# Patient Record
Sex: Male | Born: 1935
Health system: Southern US, Community
[De-identification: ages and names within clinical notes are randomized; demographics above are authoritative.]

## PROBLEM LIST (undated history)

## (undated) DIAGNOSIS — J189 Pneumonia, unspecified organism: Secondary | ICD-10-CM

## (undated) DIAGNOSIS — I4891 Unspecified atrial fibrillation: Secondary | ICD-10-CM

## (undated) DIAGNOSIS — E785 Hyperlipidemia, unspecified: Secondary | ICD-10-CM

## (undated) DIAGNOSIS — D649 Anemia, unspecified: Secondary | ICD-10-CM

## (undated) DIAGNOSIS — I4892 Unspecified atrial flutter: Secondary | ICD-10-CM

## (undated) DIAGNOSIS — I5043 Acute on chronic combined systolic (congestive) and diastolic (congestive) heart failure: Secondary | ICD-10-CM

## (undated) DIAGNOSIS — I10 Essential (primary) hypertension: Secondary | ICD-10-CM

## (undated) DIAGNOSIS — Z8719 Personal history of other diseases of the digestive system: Secondary | ICD-10-CM

## (undated) DIAGNOSIS — I480 Paroxysmal atrial fibrillation: Secondary | ICD-10-CM

## (undated) DIAGNOSIS — I4510 Unspecified right bundle-branch block: Secondary | ICD-10-CM

## (undated) DIAGNOSIS — R001 Bradycardia, unspecified: Secondary | ICD-10-CM

## (undated) DIAGNOSIS — E669 Obesity, unspecified: Secondary | ICD-10-CM

## (undated) DIAGNOSIS — D696 Thrombocytopenia, unspecified: Secondary | ICD-10-CM

## (undated) DIAGNOSIS — G7000 Myasthenia gravis without (acute) exacerbation: Secondary | ICD-10-CM

## (undated) DIAGNOSIS — I214 Non-ST elevation (NSTEMI) myocardial infarction: Secondary | ICD-10-CM

## (undated) DIAGNOSIS — Z95820 Peripheral vascular angioplasty status with implants and grafts: Secondary | ICD-10-CM

## (undated) DIAGNOSIS — A419 Sepsis, unspecified organism: Secondary | ICD-10-CM

## (undated) DIAGNOSIS — J9620 Acute and chronic respiratory failure, unspecified whether with hypoxia or hypercapnia: Secondary | ICD-10-CM

## (undated) DIAGNOSIS — E039 Hypothyroidism, unspecified: Secondary | ICD-10-CM

## (undated) DIAGNOSIS — T8859XA Other complications of anesthesia, initial encounter: Secondary | ICD-10-CM

## (undated) DIAGNOSIS — I2510 Atherosclerotic heart disease of native coronary artery without angina pectoris: Secondary | ICD-10-CM

## (undated) HISTORY — DX: Unspecified atrial fibrillation: I48.91

---

## 1992-10-01 HISTORY — PX: HERNIA REPAIR: SHX51

## 2012-06-11 ENCOUNTER — Encounter (HOSPITAL_COMMUNITY): Payer: Self-pay | Admitting: Certified Registered"

## 2012-06-11 ENCOUNTER — Emergency Department (HOSPITAL_COMMUNITY): Payer: Medicare Other

## 2012-06-11 ENCOUNTER — Inpatient Hospital Stay (HOSPITAL_COMMUNITY)
Admission: EM | Admit: 2012-06-11 | Discharge: 2012-06-14 | DRG: 494 | Disposition: A | Discharge status: Home / Self Care | Payer: Medicare Other | Attending: Orthopedic Surgery | Admitting: Orthopedic Surgery

## 2012-06-11 ENCOUNTER — Emergency Department (HOSPITAL_COMMUNITY): Payer: Medicare Other | Admitting: Certified Registered"

## 2012-06-11 ENCOUNTER — Encounter (HOSPITAL_COMMUNITY): Payer: Self-pay | Admitting: *Deleted

## 2012-06-11 ENCOUNTER — Encounter (HOSPITAL_COMMUNITY)
Admission: EM | Disposition: A | Discharge status: Home / Self Care | Payer: Self-pay | Source: Home / Self Care | Attending: Orthopedic Surgery

## 2012-06-11 DIAGNOSIS — S68019A Complete traumatic metacarpophalangeal amputation of unspecified thumb, initial encounter: Secondary | ICD-10-CM | POA: Diagnosis present

## 2012-06-11 DIAGNOSIS — Y998 Other external cause status: Secondary | ICD-10-CM

## 2012-06-11 DIAGNOSIS — S43006A Unspecified dislocation of unspecified shoulder joint, initial encounter: Secondary | ICD-10-CM

## 2012-06-11 DIAGNOSIS — W230XXA Caught, crushed, jammed, or pinched between moving objects, initial encounter: Secondary | ICD-10-CM | POA: Diagnosis present

## 2012-06-11 DIAGNOSIS — Y92410 Unspecified street and highway as the place of occurrence of the external cause: Secondary | ICD-10-CM

## 2012-06-11 DIAGNOSIS — S42253A Displaced fracture of greater tuberosity of unspecified humerus, initial encounter for closed fracture: Principal | ICD-10-CM | POA: Diagnosis present

## 2012-06-11 HISTORY — PX: AMPUTATION: SHX166

## 2012-06-11 HISTORY — DX: Personal history of other diseases of the digestive system: Z87.19

## 2012-06-11 HISTORY — PX: SHOULDER CLOSED REDUCTION: SHX1051

## 2012-06-11 LAB — POCT I-STAT, CHEM 8
BUN: 17 mg/dL (ref 6–23)
Chloride: 109 mEq/L (ref 96–112)
HCT: 38 % — ABNORMAL LOW (ref 39.0–52.0)
Sodium: 140 mEq/L (ref 135–145)
TCO2: 19 mmol/L (ref 0–100)

## 2012-06-11 SURGERY — AMPUTATION DIGIT
Anesthesia: General | Site: Thumb | Laterality: Left | Wound class: Contaminated

## 2012-06-11 MED ORDER — HYDROMORPHONE HCL PF 1 MG/ML IJ SOLN
1.0000 mg | Freq: Once | INTRAMUSCULAR | Status: AC
  Administered 2012-06-11: 1 mg via INTRAVENOUS

## 2012-06-11 MED ORDER — SUFENTANIL CITRATE 50 MCG/ML IV SOLN
INTRAVENOUS | Status: DC | PRN
  Administered 2012-06-11: 10 ug via INTRAVENOUS

## 2012-06-11 MED ORDER — SODIUM CHLORIDE 0.9 % IV SOLN
INTRAVENOUS | Status: DC
  Administered 2012-06-11: 22:00:00 via INTRAVENOUS

## 2012-06-11 MED ORDER — LACTATED RINGERS IV SOLN
INTRAVENOUS | Status: DC | PRN
  Administered 2012-06-11: via INTRAVENOUS

## 2012-06-11 MED ORDER — MIDAZOLAM HCL 5 MG/5ML IJ SOLN
INTRAMUSCULAR | Status: DC | PRN
  Administered 2012-06-11: 2 mg via INTRAVENOUS

## 2012-06-11 MED ORDER — CEFAZOLIN SODIUM 1-5 GM-% IV SOLN
1.0000 g | Freq: Once | INTRAVENOUS | Status: AC
  Administered 2012-06-11 – 2012-06-12 (×2): 1 g via INTRAVENOUS
  Filled 2012-06-11: qty 50

## 2012-06-11 MED ORDER — HYDROMORPHONE HCL PF 1 MG/ML IJ SOLN
1.0000 mg | Freq: Once | INTRAMUSCULAR | Status: AC
  Administered 2012-06-11: 1 mg via INTRAVENOUS
  Filled 2012-06-11: qty 1

## 2012-06-11 SURGICAL SUPPLY — 49 items
BANDAGE CONFORM 2  STR LF (GAUZE/BANDAGES/DRESSINGS) IMPLANT
BANDAGE ELASTIC 3 VELCRO ST LF (GAUZE/BANDAGES/DRESSINGS) ×3 IMPLANT
BANDAGE ELASTIC 4 VELCRO ST LF (GAUZE/BANDAGES/DRESSINGS) IMPLANT
BANDAGE GAUZE ELAST BULKY 4 IN (GAUZE/BANDAGES/DRESSINGS) ×3 IMPLANT
BNDG COHESIVE 1X5 TAN STRL LF (GAUZE/BANDAGES/DRESSINGS) IMPLANT
BNDG ELASTIC 2 VLCR STRL LF (GAUZE/BANDAGES/DRESSINGS) ×3 IMPLANT
BNDG ESMARK 4X9 LF (GAUZE/BANDAGES/DRESSINGS) ×3 IMPLANT
CANISTER SUCTION 2500CC (MISCELLANEOUS) ×3 IMPLANT
CLOTH BEACON ORANGE TIMEOUT ST (SAFETY) ×3 IMPLANT
CORDS BIPOLAR (ELECTRODE) ×3 IMPLANT
COVER SURGICAL LIGHT HANDLE (MISCELLANEOUS) ×3 IMPLANT
CUFF TOURNIQUET SINGLE 18IN (TOURNIQUET CUFF) ×3 IMPLANT
CUFF TOURNIQUET SINGLE 24IN (TOURNIQUET CUFF) IMPLANT
DRAPE SURG 17X23 STRL (DRAPES) ×3 IMPLANT
DRSG ADAPTIC 3X8 NADH LF (GAUZE/BANDAGES/DRESSINGS) IMPLANT
DRSG EMULSION OIL 3X3 NADH (GAUZE/BANDAGES/DRESSINGS) ×3 IMPLANT
GAUZE SPONGE 2X2 8PLY STRL LF (GAUZE/BANDAGES/DRESSINGS) IMPLANT
GLOVE BIO SURGEON STRL SZ7 (GLOVE) ×3 IMPLANT
GLOVE BIOGEL PI IND STRL 7.5 (GLOVE) ×2 IMPLANT
GLOVE BIOGEL PI IND STRL 8.5 (GLOVE) ×2 IMPLANT
GLOVE BIOGEL PI INDICATOR 7.5 (GLOVE) ×1
GLOVE BIOGEL PI INDICATOR 8.5 (GLOVE) ×1
GLOVE SURG ORTHO 8.0 STRL STRW (GLOVE) ×3 IMPLANT
GLOVE SURG SS PI 7.5 STRL IVOR (GLOVE) ×3 IMPLANT
GOWN PREVENTION PLUS XLARGE (GOWN DISPOSABLE) ×3 IMPLANT
GOWN SRG XL XLNG 56XLVL 4 (GOWN DISPOSABLE) ×2 IMPLANT
GOWN STRL NON-REIN LRG LVL3 (GOWN DISPOSABLE) IMPLANT
GOWN STRL NON-REIN XL XLG LVL4 (GOWN DISPOSABLE) ×1
KIT BASIN OR (CUSTOM PROCEDURE TRAY) ×3 IMPLANT
KIT ROOM TURNOVER OR (KITS) ×3 IMPLANT
MANIFOLD NEPTUNE II (INSTRUMENTS) IMPLANT
NEEDLE HYPO 25GX1X1/2 BEV (NEEDLE) ×3 IMPLANT
NS IRRIG 1000ML POUR BTL (IV SOLUTION) ×3 IMPLANT
PACK ORTHO EXTREMITY (CUSTOM PROCEDURE TRAY) ×3 IMPLANT
PAD ARMBOARD 7.5X6 YLW CONV (MISCELLANEOUS) ×3 IMPLANT
PAD CAST 4YDX4 CTTN HI CHSV (CAST SUPPLIES) IMPLANT
PADDING CAST COTTON 4X4 STRL (CAST SUPPLIES)
SOAP 2 % CHG 4 OZ (WOUND CARE) ×3 IMPLANT
SPECIMEN JAR SMALL (MISCELLANEOUS) IMPLANT
SPONGE GAUZE 2X2 STER 10/PKG (GAUZE/BANDAGES/DRESSINGS)
SPONGE GAUZE 4X4 12PLY (GAUZE/BANDAGES/DRESSINGS) ×3 IMPLANT
SUCTION FRAZIER TIP 10 FR DISP (SUCTIONS) ×3 IMPLANT
SUT MERSILENE 4 0 P 3 (SUTURE) IMPLANT
SUT PROLENE 4 0 PS 2 18 (SUTURE) ×6 IMPLANT
SYR CONTROL 10ML LL (SYRINGE) ×3 IMPLANT
TOWEL OR 17X24 6PK STRL BLUE (TOWEL DISPOSABLE) ×3 IMPLANT
TOWEL OR 17X26 10 PK STRL BLUE (TOWEL DISPOSABLE) ×3 IMPLANT
TUBE CONNECTING 12X1/4 (SUCTIONS) ×3 IMPLANT
WATER STERILE IRR 1000ML POUR (IV SOLUTION) IMPLANT
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
## 2012-06-11 NOTE — Anesthesia Preprocedure Evaluation (Addendum)
Anesthesia Evaluation  Patient identified by MRN, date of birth, ID band Patient awake and Patient confused    Reviewed: Allergy & Precautions, H&P , NPO status , Patient's Chart, lab work & pertinent test results  History of Anesthesia Complications Negative for: history of anesthetic complications  Airway       Dental   Pulmonary neg pulmonary ROS,          Cardiovascular Exercise Tolerance: Good     Neuro/Psych negative neurological ROS  negative psych ROS   GI/Hepatic negative GI ROS, Neg liver ROS,   Endo/Other  Hypothyroidism   Renal/GU negative Renal ROS  negative genitourinary   Musculoskeletal negative musculoskeletal ROS (+)   Abdominal   Peds  Hematology negative hematology ROS (+)   Anesthesia Other Findings   Reproductive/Obstetrics                           Anesthesia Physical Anesthesia Plan  ASA: II and Emergent  Anesthesia Plan: General   Post-op Pain Management:    Induction: Intravenous  Airway Management Planned: Oral ETT  Additional Equipment:   Intra-op Plan:   Post-operative Plan: Extubation in OR  Informed Consent: I have reviewed the patients History and Physical, chart, labs and discussed the procedure including the risks, benefits and alternatives for the proposed anesthesia with the patient or authorized representative who has indicated his/her understanding and acceptance.   Dental advisory given  Plan Discussed with: CRNA, Anesthesiologist and Surgeon  Anesthesia Plan Comments:         Anesthesia Quick Evaluation

## 2012-06-11 NOTE — ED Notes (Signed)
Dr. Orlan Leavens paged (x3)

## 2012-06-11 NOTE — ED Notes (Signed)
Dr. Ortman paged 

## 2012-06-11 NOTE — ED Notes (Addendum)
Dr. Orlan Leavens paged (x1)
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
## 2012-06-11 NOTE — ED Notes (Signed)
Dr. Orlan Leavens on his way to see pt.

## 2012-06-11 NOTE — ED Notes (Signed)
Labs to drawn at [facility] per [employer].

## 2012-06-11 NOTE — ED Notes (Addendum)
Dr. Orlan Leavens paged (x2)

## 2012-06-11 NOTE — ED Provider Notes (Signed)
10:09 PM Patient has arrived to CDU to see Dr Melvyn Novas for thumb amputation and shoulder dislocation.  Pt initially seen by Dr Rubin Payor at [facility].  Sign out received from Cletis Athens, Consulting civil engineer.  Pt has arrived without incident.  States he has continued pain in his arm and had.  Have ordered second dose of dilaudid be given.     Pt admitted to Dr Melvyn Novas.    [place], [place] 06/11/12 2357

## 2012-06-12 ENCOUNTER — Inpatient Hospital Stay (HOSPITAL_COMMUNITY): Payer: Medicare Other

## 2012-06-12 ENCOUNTER — Encounter (HOSPITAL_COMMUNITY): Payer: Self-pay | Admitting: *Deleted

## 2012-06-12 ENCOUNTER — Other Ambulatory Visit (HOSPITAL_COMMUNITY): Payer: Self-pay

## 2012-06-12 MED ORDER — SUCCINYLCHOLINE CHLORIDE 20 MG/ML IJ SOLN
INTRAMUSCULAR | Status: DC | PRN
  Administered 2012-06-12: 140 mg via INTRAVENOUS

## 2012-06-12 MED ORDER — HYDROMORPHONE HCL PF 1 MG/ML IJ SOLN: 0.2500 mg | INTRAMUSCULAR | Status: DC | PRN

## 2012-06-12 MED ORDER — LIDOCAINE HCL 4 % MT SOLN
OROMUCOSAL | Status: DC | PRN
  Administered 2012-06-12: 4 mL via TOPICAL

## 2012-06-12 MED ORDER — ONDANSETRON HCL 4 MG PO TABS: 4.0000 mg | ORAL_TABLET | Freq: Four times a day (QID) | ORAL | Status: DC | PRN

## 2012-06-12 MED ORDER — LEVOTHYROXINE SODIUM 50 MCG PO TABS
50.0000 ug | ORAL_TABLET | Freq: Every day | ORAL | Status: DC
  Administered 2012-06-12 – 2012-06-13 (×2): 50 ug via ORAL
  Filled 2012-06-12 (×4): qty 1

## 2012-06-12 MED ORDER — METHOCARBAMOL 500 MG PO TABS
500.0000 mg | ORAL_TABLET | Freq: Four times a day (QID) | ORAL | Status: DC | PRN
  Filled 2012-06-12: qty 1

## 2012-06-12 MED ORDER — ONDANSETRON HCL 4 MG/2ML IJ SOLN
INTRAMUSCULAR | Status: DC | PRN
  Administered 2012-06-12: 4 mg via INTRAVENOUS

## 2012-06-12 MED ORDER — ACETAMINOPHEN 10 MG/ML IV SOLN
INTRAVENOUS | Status: DC | PRN
  Administered 2012-06-12: 1000 mg via INTRAVENOUS

## 2012-06-12 MED ORDER — DROPERIDOL 2.5 MG/ML IJ SOLN
INTRAMUSCULAR | Status: DC | PRN
  Administered 2012-06-12: 0.625 mg via INTRAVENOUS

## 2012-06-12 MED ORDER — CEFAZOLIN SODIUM 1-5 GM-% IV SOLN
1.0000 g | INTRAVENOUS | Status: DC
  Filled 2012-06-12: qty 50

## 2012-06-12 MED ORDER — ONDANSETRON HCL 4 MG/2ML IJ SOLN: 4.0000 mg | Freq: Four times a day (QID) | INTRAMUSCULAR | Status: DC | PRN

## 2012-06-12 MED ORDER — DEXAMETHASONE SODIUM PHOSPHATE 4 MG/ML IJ SOLN
INTRAMUSCULAR | Status: DC | PRN
  Administered 2012-06-12: 4 mg via INTRAVENOUS

## 2012-06-12 MED ORDER — 0.9 % SODIUM CHLORIDE (POUR BTL) OPTIME
TOPICAL | Status: DC | PRN
  Administered 2012-06-12: 1000 mL

## 2012-06-12 MED ORDER — MORPHINE SULFATE 2 MG/ML IJ SOLN: 1.0000 mg | INTRAMUSCULAR | Status: DC | PRN

## 2012-06-12 MED ORDER — ZOLPIDEM TARTRATE 5 MG PO TABS: 5.0000 mg | ORAL_TABLET | Freq: Every evening | ORAL | Status: DC | PRN

## 2012-06-12 MED ORDER — EPHEDRINE SULFATE 50 MG/ML IJ SOLN
INTRAMUSCULAR | Status: DC | PRN
  Administered 2012-06-12 (×2): 5 mg via INTRAVENOUS

## 2012-06-12 MED ORDER — KCL IN DEXTROSE-NACL 20-5-0.45 MEQ/L-%-% IV SOLN
INTRAVENOUS | Status: DC
  Administered 2012-06-12 – 2012-06-13 (×3): via INTRAVENOUS
  Filled 2012-06-12 (×5): qty 1000

## 2012-06-12 MED ORDER — HYDROCODONE-ACETAMINOPHEN 5-325 MG PO TABS: 1.0000 | ORAL_TABLET | ORAL | Status: DC | PRN

## 2012-06-12 MED ORDER — CHLORHEXIDINE GLUCONATE 4 % EX LIQD
60.0000 mL | Freq: Once | CUTANEOUS | Status: AC
  Administered 2012-06-13: 4 via TOPICAL
  Filled 2012-06-12: qty 60

## 2012-06-12 MED ORDER — BUPIVACAINE HCL (PF) 0.25 % IJ SOLN
INTRAMUSCULAR | Status: AC
  Filled 2012-06-12: qty 30

## 2012-06-12 MED ORDER — CEFAZOLIN SODIUM 1-5 GM-% IV SOLN
1.0000 g | Freq: Three times a day (TID) | INTRAVENOUS | Status: DC
  Administered 2012-06-12 – 2012-06-13 (×4): 1 g via INTRAVENOUS
  Filled 2012-06-12 (×7): qty 50

## 2012-06-12 MED ORDER — BUPIVACAINE HCL (PF) 0.25 % IJ SOLN
INTRAMUSCULAR | Status: DC | PRN
  Administered 2012-06-12: 12 mL

## 2012-06-12 MED ORDER — DOCUSATE SODIUM 100 MG PO CAPS
100.0000 mg | ORAL_CAPSULE | Freq: Two times a day (BID) | ORAL | Status: DC
  Administered 2012-06-12 – 2012-06-13 (×3): 100 mg via ORAL
  Filled 2012-06-12 (×6): qty 1

## 2012-06-12 MED ORDER — PROPOFOL 10 MG/ML IV BOLUS
INTRAVENOUS | Status: DC | PRN
  Administered 2012-06-12: 170 mg via INTRAVENOUS

## 2012-06-12 MED ORDER — OXYCODONE HCL 5 MG PO TABS: 5.0000 mg | ORAL_TABLET | ORAL | Status: DC | PRN

## 2012-06-12 MED ORDER — DIPHENHYDRAMINE HCL 25 MG PO CAPS: 25.0000 mg | ORAL_CAPSULE | Freq: Four times a day (QID) | ORAL | Status: DC | PRN

## 2012-06-12 MED ORDER — ADULT MULTIVITAMIN W/MINERALS CH
1.0000 | ORAL_TABLET | Freq: Every day | ORAL | Status: DC
  Administered 2012-06-13: 1 via ORAL
  Filled 2012-06-12 (×4): qty 1

## 2012-06-12 MED ORDER — METHOCARBAMOL 100 MG/ML IJ SOLN
500.0000 mg | Freq: Four times a day (QID) | INTRAVENOUS | Status: DC | PRN
  Filled 2012-06-12: qty 5

## 2012-06-12 MED ORDER — LIDOCAINE HCL (CARDIAC) 20 MG/ML IV SOLN
INTRAVENOUS | Status: DC | PRN
  Administered 2012-06-12: 100 mg via INTRAVENOUS

## 2012-06-12 MED ORDER — VITAMIN C 500 MG PO TABS
1000.0000 mg | ORAL_TABLET | Freq: Every day | ORAL | Status: DC
  Administered 2012-06-13: 1000 mg via ORAL
  Filled 2012-06-12 (×4): qty 2

## 2012-06-12 NOTE — Progress Notes (Signed)
Orthopedics Progress Note  Subjective: Patient reporting moderate left shoulder pain. No pain in the right shoulder or the legs  Objective:  Filed Vitals:   06/12/12 0931  BP: 169/90  Pulse: 78  Temp: 97.6 F (36.4 C)  Resp: 20    General: Awake and alert  Musculoskeletal: Left shoulder swollen and tender, sensation intact deltoid distribution, deltoid motor function normal Neurovascularly intact  Lab Results  Component Value Date   HGB 12.9* 06/11/2012   HCT 38.0* 06/11/2012       Component Value Date/Time   NA 140 06/11/2012 2254   K 3.9 06/11/2012 2254   CL 109 06/11/2012 2254   GLUCOSE 128* 06/11/2012 2254   BUN 17 06/11/2012 2254   CREATININE 0.90 06/11/2012 2254    No results found for this basename: INR, PROTIME    Assessment/Plan: POD #1 s/p Procedure(s): AMPUTATION DIGIT CLOSED MANIPULATION SHOULDER Patient with persistent displacement of the greater tuberosity left shoulder after the closed reduction.. By CT scan Discussed with the patient options for management of the shoulder injury recommending ORIF to restore the alignment of the tuberosity to insure improved rotator cuff function vs conservative management with malunion and unknown cuff function.  The patient was in agreement with proceeding with shoulder surgery. Plan ORIF tomorrow. Orders in EPIC  Steven R. Eleena Grater, MD 06/12/2012 1:16 PM     

## 2012-06-12 NOTE — ED Provider Notes (Signed)
Medical screening examination/treatment/procedure(s) were performed by non-physician practitioner and as supervising physician I was immediately available for consultation/collaboration.  Geoffery Lyons, MD 06/12/12 [phone]
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
## 2012-06-12 NOTE — OR Nursing (Signed)
Dr. Chaney Malling called and update sbp 160's-180.  No new orders.  Pt can be transferred to 6 north room 14.

## 2012-06-12 NOTE — Brief Op Note (Signed)
06/11/2012 - 06/12/2012  1:06 AM  PATIENT:  Marcus [name]  78 y.o. male  PRE-OPERATIVE DIAGNOSIS:  Left Thumb Traumatic Amputation, Left Shoulder Dislocation  POST-OPERATIVE DIAGNOSIS:  Left Thumb Traumatic Amputation, Left Shoulder Dislocation  PROCEDURE:  Procedure(s) (LRB) with comments: AMPUTATION DIGIT (Left) - revision of amputation CLOSED MANIPULATION SHOULDER (Left)  SURGEON:  Surgeon(s) and Role:    * Sharma Covert, MD - Primary  PHYSICIAN ASSISTANT: none  ASSISTANTS: none   ANESTHESIA:   general  EBL:     BLOOD ADMINISTERED:none  DRAINS: none   LOCAL MEDICATIONS USED:  MARCAINE     SPECIMEN:  No Specimen  DISPOSITION OF SPECIMEN:  N/A  COUNTS:  YES  TOURNIQUET:   Total Tourniquet Time Documented: Upper Arm (Left) - 26 minutes  DICTATION: .Other Dictation: Dictation Number [id number]  PLAN OF CARE: Admit to inpatient   PATIENT DISPOSITION:  PACU - hemodynamically stable.   Delay start of Pharmacological VTE agent (>24hrs) due to surgical blood loss or risk of bleeding: not applicable

## 2012-06-12 NOTE — Transfer of Care (Signed)
Immediate Anesthesia Transfer of Care Note  Patient: Marcus [name]  Procedure(s) Performed: Procedure(s) (LRB) with comments: AMPUTATION DIGIT (Left) - revision of amputation CLOSED MANIPULATION SHOULDER (Left)  Patient Location: PACU  Anesthesia Type: General  Level of Consciousness: awake, alert , oriented and patient cooperative  Airway & Oxygen Therapy: Patient Spontanous Breathing and Patient connected to nasal cannula oxygen  Post-op Assessment: Report given to PACU RN, Post -op Vital signs reviewed and stable and Patient moving all extremities  Post vital signs: Reviewed and stable  Complications: No apparent anesthesia complications

## 2012-06-12 NOTE — Anesthesia Postprocedure Evaluation (Signed)
Anesthesia Post Note  Patient: Marcus [name]  Procedure(s) Performed: Procedure(s) (LRB): AMPUTATION DIGIT (Left) CLOSED MANIPULATION SHOULDER (Left)  Anesthesia type: General  Patient location: PACU  Post pain: Pain level controlled and Adequate analgesia  Post assessment: Post-op Vital signs reviewed, Patient's Cardiovascular Status Stable, Respiratory Function Stable, Patent Airway and Pain level controlled  Last Vitals:  Filed Vitals:   06/12/12 0130  BP: 171/84  Pulse: 94  Temp:   Resp: 18    Post vital signs: Reviewed and stable  Level of consciousness: awake, alert  and oriented  Complications: No apparent anesthesia complications
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
## 2012-06-13 ENCOUNTER — Inpatient Hospital Stay (HOSPITAL_COMMUNITY): Payer: Medicare Other | Admitting: Anesthesiology

## 2012-06-13 ENCOUNTER — Encounter (HOSPITAL_COMMUNITY)
Admission: EM | Disposition: A | Discharge status: Home / Self Care | Payer: Self-pay | Source: Home / Self Care | Attending: Orthopedic Surgery

## 2012-06-13 ENCOUNTER — Encounter (HOSPITAL_COMMUNITY): Payer: Self-pay | Admitting: Anesthesiology

## 2012-06-13 HISTORY — PX: ORIF SHOULDER FRACTURE: SHX5035

## 2012-06-13 LAB — COMPREHENSIVE METABOLIC PANEL
ALT: 19 U/L (ref 0–53)
BUN: 13 mg/dL (ref 6–23)
CO2: 24 mEq/L (ref 19–32)
Calcium: 8.9 mg/dL (ref 8.4–10.5)
Creatinine, Ser: 0.79 mg/dL (ref 0.50–1.35)
GFR calc Af Amer: >90 mL/min (ref >90)
GFR calc non Af Amer: 84 mL/min — ABNORMAL LOW (ref >90)
Glucose, Bld: 142 mg/dL — ABNORMAL HIGH (ref 70–99)
Sodium: 136 mEq/L (ref 135–145)
Total Protein: 7.2 g/dL (ref 6.0–8.3)

## 2012-06-13 LAB — CBC
HCT: 35.6 % — ABNORMAL LOW (ref 39.0–52.0)
HCT: 37.9 % — ABNORMAL LOW (ref 39.0–52.0)
Hemoglobin: 13.1 g/dL (ref 13.0–17.0)
MCHC: 34.6 g/dL (ref 30.0–36.0)
MCV: 92.2 fL (ref 78.0–100.0)
Platelets: 120 10*3/uL — ABNORMAL LOW (ref 150–400)
RBC: 3.86 MIL/uL — ABNORMAL LOW (ref 4.22–5.81)
RDW: 14.3 % (ref 11.5–15.5)
WBC: 9.1 10*3/uL (ref 4.0–10.5)
WBC: 13.1 10*3/uL — ABNORMAL HIGH (ref 4.0–10.5)

## 2012-06-13 LAB — BASIC METABOLIC PANEL
CO2: 24 mEq/L (ref 19–32)
Chloride: 107 mEq/L (ref 96–112)
Creatinine, Ser: 0.75 mg/dL (ref 0.50–1.35)
GFR calc Af Amer: >90 mL/min (ref >90)
Potassium: 5 mEq/L (ref 3.5–5.1)

## 2012-06-13 SURGERY — OPEN REDUCTION INTERNAL FIXATION (ORIF) SHOULDER FRACTURE
Anesthesia: General | Laterality: Left

## 2012-06-13 SURGERY — OPEN REDUCTION INTERNAL FIXATION (ORIF) SHOULDER FRACTURE
Anesthesia: General | Site: Shoulder | Laterality: Left | Wound class: Clean

## 2012-06-13 MED ORDER — MIDAZOLAM HCL 5 MG/5ML IJ SOLN
INTRAMUSCULAR | Status: DC | PRN
  Administered 2012-06-13: 2 mg via INTRAVENOUS

## 2012-06-13 MED ORDER — LABETALOL HCL 5 MG/ML IV SOLN
INTRAVENOUS | Status: DC | PRN
  Administered 2012-06-13 (×2): 5 mg via INTRAVENOUS
  Administered 2012-06-13 (×2): 10 mg via INTRAVENOUS

## 2012-06-13 MED ORDER — MIDAZOLAM HCL 2 MG/2ML IJ SOLN
INTRAMUSCULAR | Status: AC
  Filled 2012-06-13: qty 2

## 2012-06-13 MED ORDER — PROMETHAZINE HCL 25 MG/ML IJ SOLN: 6.2500 mg | INTRAMUSCULAR | Status: DC | PRN

## 2012-06-13 MED ORDER — LACTATED RINGERS IV SOLN
INTRAVENOUS | Status: DC
  Administered 2012-06-13 (×2): via INTRAVENOUS

## 2012-06-13 MED ORDER — GLYCOPYRROLATE 0.2 MG/ML IJ SOLN
INTRAMUSCULAR | Status: DC | PRN
  Administered 2012-06-13 (×2): 0.4 mg via INTRAVENOUS

## 2012-06-13 MED ORDER — CEFAZOLIN SODIUM-DEXTROSE 2-3 GM-% IV SOLR
2.0000 g | INTRAVENOUS | Status: AC
  Administered 2012-06-13: 2 g via INTRAVENOUS
  Filled 2012-06-13: qty 50

## 2012-06-13 MED ORDER — NEOSTIGMINE METHYLSULFATE 1 MG/ML IJ SOLN
INTRAMUSCULAR | Status: DC | PRN
  Administered 2012-06-13: 3 mg via INTRAVENOUS
  Administered 2012-06-13 (×2): 2 mg via INTRAVENOUS

## 2012-06-13 MED ORDER — LACTATED RINGERS IV SOLN
INTRAVENOUS | Status: DC
  Administered 2012-06-13: 10:00:00 via INTRAVENOUS

## 2012-06-13 MED ORDER — BUPIVACAINE-EPINEPHRINE PF 0.25-1:200000 % IJ SOLN
INTRAMUSCULAR | Status: AC
  Filled 2012-06-13: qty 30

## 2012-06-13 MED ORDER — ESMOLOL HCL 10 MG/ML IV SOLN
INTRAVENOUS | Status: DC | PRN
  Administered 2012-06-13: 10 mg via INTRAVENOUS
  Administered 2012-06-13: 20 mg via INTRAVENOUS
  Administered 2012-06-13: 10 mg via INTRAVENOUS

## 2012-06-13 MED ORDER — CHLORHEXIDINE GLUCONATE 4 % EX LIQD
60.0000 mL | Freq: Once | CUTANEOUS | Status: DC
  Filled 2012-06-13: qty 60

## 2012-06-13 MED ORDER — MIDAZOLAM HCL 2 MG/2ML IJ SOLN: 1.0000 mg | INTRAMUSCULAR | Status: DC | PRN

## 2012-06-13 MED ORDER — BUPIVACAINE-EPINEPHRINE PF 0.5-1:200000 % IJ SOLN
INTRAMUSCULAR | Status: DC | PRN
  Administered 2012-06-13: 25 mL

## 2012-06-13 MED ORDER — CEFAZOLIN SODIUM-DEXTROSE 2-3 GM-% IV SOLR
INTRAVENOUS | Status: AC
  Filled 2012-06-13: qty 50

## 2012-06-13 MED ORDER — DEXAMETHASONE SODIUM PHOSPHATE 4 MG/ML IJ SOLN
INTRAMUSCULAR | Status: DC | PRN
  Administered 2012-06-13: 4 mg

## 2012-06-13 MED ORDER — FENTANYL CITRATE 0.05 MG/ML IJ SOLN
INTRAMUSCULAR | Status: AC
  Filled 2012-06-13: qty 2

## 2012-06-13 MED ORDER — CEFAZOLIN SODIUM 1-5 GM-% IV SOLN
1.0000 g | Freq: Three times a day (TID) | INTRAVENOUS | Status: DC
  Administered 2012-06-13 – 2012-06-14 (×2): 1 g via INTRAVENOUS
  Filled 2012-06-13 (×4): qty 50

## 2012-06-13 MED ORDER — ONDANSETRON HCL 4 MG/2ML IJ SOLN
INTRAMUSCULAR | Status: DC | PRN
  Administered 2012-06-13: 4 mg via INTRAVENOUS

## 2012-06-13 MED ORDER — FENTANYL CITRATE 0.05 MG/ML IJ SOLN
50.0000 ug | INTRAMUSCULAR | Status: DC | PRN
  Administered 2012-06-13: 100 ug via INTRAVENOUS

## 2012-06-13 MED ORDER — LACTATED RINGERS IV SOLN
INTRAVENOUS | Status: DC | PRN
  Administered 2012-06-13 (×2): via INTRAVENOUS

## 2012-06-13 MED ORDER — PHENYLEPHRINE HCL 10 MG/ML IJ SOLN
INTRAMUSCULAR | Status: DC | PRN
  Administered 2012-06-13 (×4): 80 ug via INTRAVENOUS

## 2012-06-13 MED ORDER — PROPOFOL 10 MG/ML IV BOLUS
INTRAVENOUS | Status: DC | PRN
  Administered 2012-06-13: 100 mg via INTRAVENOUS
  Administered 2012-06-13: 140 mg via INTRAVENOUS

## 2012-06-13 MED ORDER — FENTANYL CITRATE 0.05 MG/ML IJ SOLN: 50.0000 ug | Freq: Once | INTRAMUSCULAR | Status: DC

## 2012-06-13 MED ORDER — NALOXONE HCL 0.4 MG/ML IJ SOLN
INTRAMUSCULAR | Status: DC | PRN
  Administered 2012-06-13: 0.1 mg via INTRAVENOUS

## 2012-06-13 MED ORDER — BUPIVACAINE-EPINEPHRINE 0.5% -1:200000 IJ SOLN
INTRAMUSCULAR | Status: DC | PRN
  Administered 2012-06-13: 5.5 mL

## 2012-06-13 MED ORDER — MIDAZOLAM HCL 2 MG/2ML IJ SOLN
1.0000 mg | INTRAMUSCULAR | Status: DC | PRN
  Administered 2012-06-13: 2 mg via INTRAVENOUS

## 2012-06-13 MED ORDER — ROCURONIUM BROMIDE 100 MG/10ML IV SOLN
INTRAVENOUS | Status: DC | PRN
  Administered 2012-06-13: 40 mg via INTRAVENOUS

## 2012-06-13 MED ORDER — 0.9 % SODIUM CHLORIDE (POUR BTL) OPTIME
TOPICAL | Status: DC | PRN
  Administered 2012-06-13: 1000 mL

## 2012-06-13 MED ORDER — LIDOCAINE HCL (CARDIAC) 20 MG/ML IV SOLN
INTRAVENOUS | Status: DC | PRN
  Administered 2012-06-13: 80 mg via INTRAVENOUS

## 2012-06-13 MED ORDER — PROPOFOL 10 MG/ML IV EMUL
5.0000 ug/kg/min | INTRAVENOUS | Status: DC
  Filled 2012-06-13: qty 100

## 2012-06-13 MED ORDER — HYDROMORPHONE HCL PF 1 MG/ML IJ SOLN: 0.2500 mg | INTRAMUSCULAR | Status: DC | PRN

## 2012-06-13 MED ORDER — ACETAMINOPHEN 10 MG/ML IV SOLN
INTRAVENOUS | Status: AC
  Filled 2012-06-13: qty 100

## 2012-06-13 SURGICAL SUPPLY — 54 items
BIT DRILL 2.5X2.75 QC CALB (BIT) ×2 IMPLANT
BIT DRILL 3.5X5.5 QC CALB (BIT) ×2 IMPLANT
CLOTH BEACON ORANGE TIMEOUT ST (SAFETY) ×2 IMPLANT
DRAPE INCISE IOBAN 66X45 STRL (DRAPES) ×2 IMPLANT
DRAPE U-SHAPE 47X51 STRL (DRAPES) ×2 IMPLANT
DRSG EMULSION OIL 3X3 NADH (GAUZE/BANDAGES/DRESSINGS) ×2 IMPLANT
DRSG PAD ABDOMINAL 8X10 ST (GAUZE/BANDAGES/DRESSINGS) IMPLANT
ELECT REM PT RETURN 9FT ADLT (ELECTROSURGICAL) ×2
ELECTRODE REM PT RTRN 9FT ADLT (ELECTROSURGICAL) ×1 IMPLANT
GLOVE BIOGEL PI ORTHO PRO 7.5 (GLOVE) ×1
GLOVE BIOGEL PI ORTHO PRO SZ8 (GLOVE) ×1
GLOVE ORTHO TXT STRL SZ7.5 (GLOVE) ×2 IMPLANT
GLOVE PI ORTHO PRO STRL 7.5 (GLOVE) ×1 IMPLANT
GLOVE PI ORTHO PRO STRL SZ8 (GLOVE) ×1 IMPLANT
GLOVE SURG ORTHO 8.5 STRL (GLOVE) ×2 IMPLANT
GOWN STRL NON-REIN LRG LVL3 (GOWN DISPOSABLE) ×4 IMPLANT
GOWN STRL REIN XL XLG (GOWN DISPOSABLE) ×4 IMPLANT
KIT BASIN OR (CUSTOM PROCEDURE TRAY) ×2 IMPLANT
KIT ROOM TURNOVER OR (KITS) ×2 IMPLANT
MANIFOLD NEPTUNE II (INSTRUMENTS) ×2 IMPLANT
NDL SUT 6 .5 CRC .975X.05 MAYO (NEEDLE) ×1 IMPLANT
NEEDLE 22X1 1/2 (OR ONLY) (NEEDLE) IMPLANT
NEEDLE MAYO TAPER (NEEDLE) ×1
NS IRRIG 1000ML POUR BTL (IV SOLUTION) ×2 IMPLANT
PACK SHOULDER (CUSTOM PROCEDURE TRAY) ×2 IMPLANT
PAD ARMBOARD 7.5X6 YLW CONV (MISCELLANEOUS) ×4 IMPLANT
PASSER SUT SWANSON 36MM LOOP (INSTRUMENTS) IMPLANT
SCREW CANC LAG 4X50 (Screw) ×2 IMPLANT
SCREW NLOCK CANC HEX 4X35 (Screw) ×2 IMPLANT
SLING ARM FOAM STRAP LRG (SOFTGOODS) ×2 IMPLANT
SPONGE GAUZE 4X4 12PLY (GAUZE/BANDAGES/DRESSINGS) ×2 IMPLANT
SPONGE LAP 4X18 X RAY DECT (DISPOSABLE) ×4 IMPLANT
STAPLER VISISTAT 35W (STAPLE) IMPLANT
STRIP CLOSURE SKIN 1/2X4 (GAUZE/BANDAGES/DRESSINGS) IMPLANT
SUCTION FRAZIER TIP 10 FR DISP (SUCTIONS) ×2 IMPLANT
SUT BONE WAX W31G (SUTURE) IMPLANT
SUT ETHIBOND NAB CT1 #1 30IN (SUTURE) IMPLANT
SUT FIBERWIRE #2 38 T-5 BLUE (SUTURE) ×4
SUT MNCRL AB 4-0 PS2 18 (SUTURE) ×2 IMPLANT
SUT VIC AB 0 CT1 27 (SUTURE) ×1
SUT VIC AB 0 CT1 27XBRD ANBCTR (SUTURE) ×1 IMPLANT
SUT VIC AB 2-0 CT1 27 (SUTURE) ×1
SUT VIC AB 2-0 CT1 TAPERPNT 27 (SUTURE) ×1 IMPLANT
SUT VICRYL 4-0 PS2 18IN ABS (SUTURE) IMPLANT
SUTURE FIBERWR #2 38 T-5 BLUE (SUTURE) ×2 IMPLANT
SYR CONTROL 10ML LL (SYRINGE) ×2 IMPLANT
TAPE CLOTH SURG 6X10 WHT LF (GAUZE/BANDAGES/DRESSINGS) ×2 IMPLANT
TAPE STRIPS DRAPE STRL (GAUZE/BANDAGES/DRESSINGS) ×2 IMPLANT
TOWEL OR 17X24 6PK STRL BLUE (TOWEL DISPOSABLE) ×2 IMPLANT
TOWEL OR 17X26 10 PK STRL BLUE (TOWEL DISPOSABLE) ×2 IMPLANT
WASHER FLAT ACE (Orthopedic Implant) ×1 IMPLANT
WASHER PLAIN FLAT ACE NS 3PK (Orthopedic Implant) ×1 IMPLANT
WATER STERILE IRR 1000ML POUR (IV SOLUTION) IMPLANT
YANKAUER SUCT BULB TIP NO VENT (SUCTIONS) ×2 IMPLANT

## 2012-06-13 NOTE — Progress Notes (Signed)
Pt transferred via bed from PACU at 1715hrs.  BP elevated on arrival.  Oriented to unit and plan of care for shift, verbalized understanding.  Pt having frequent need to urinate.  Condom catheter placed per pt request due to constant dribbling of urine in seated position.  Sling on right arm, fingers warm and dry with normal sensation.  Ice pack on shoulder, dressing CDI.  Pt denies pain.  Will wait for pt to relax to see if BP  Comes down.

## 2012-06-13 NOTE — H&P (View-Only) (Signed)
Orthopedics Progress Note  Subjective: Patient reporting moderate left shoulder pain. No pain in the right shoulder or the legs  Objective:  Filed Vitals:   06/12/12 0931  BP: 169/90  Pulse: 78  Temp: 97.6 F (36.4 C)  Resp: 20    General: Awake and alert  Musculoskeletal: Left shoulder swollen and tender, sensation intact deltoid distribution, deltoid motor function normal Neurovascularly intact  Lab Results  Component Value Date   HGB 12.9* 06/11/2012   HCT 38.0* 06/11/2012       Component Value Date/Time   NA 140 06/11/2012 2254   K 3.9 06/11/2012 2254   CL 109 06/11/2012 2254   GLUCOSE 128* 06/11/2012 2254   BUN 17 06/11/2012 2254   CREATININE 0.90 06/11/2012 2254    No results found for this basename: INR, PROTIME    Assessment/Plan: POD #1 s/p Procedure(s): AMPUTATION DIGIT CLOSED MANIPULATION SHOULDER Patient with persistent displacement of the greater tuberosity left shoulder after the closed reduction.. By CT scan Discussed with the patient options for management of the shoulder injury recommending ORIF to restore the alignment of the tuberosity to insure improved rotator cuff function vs conservative management with malunion and unknown cuff function.  The patient was in agreement with proceeding with shoulder surgery. Plan ORIF tomorrow. Orders in [place] R. Ranell Patrick, MD 06/12/2012 1:16 PM

## 2012-06-13 NOTE — Progress Notes (Signed)
Inpatient Diabetes Program Recommendations  AACE/ADA: New Consensus Statement on Inpatient Glycemic Control (2013)  Target Ranges:  Prepandial:   less than 140 mg/dL      Peak postprandial:   less than 180 mg/dL (1-2 hours)      Critically ill patients:  140 - 180 mg/dL   Reason for Visit: Hyperglycemia while on steroid therapy  Inpatient Diabetes Program Recommendations Correction (SSI): When usiing Decadron. please use sensitive correction insulin for the 12-24 hrs folloiwng administration.of Decadron  Note: May want to order a HgbA1C to be sure there is no history of hyperglycemia. Thank you, Marcus Coffin, RN, CNS, Diabetes Coordinator [phone])

## 2012-06-13 NOTE — Preoperative (Signed)
Beta Blockers   Reason not to administer Beta Blockers:Not Applicable 

## 2012-06-13 NOTE — Transfer of Care (Signed)
Immediate Anesthesia Transfer of Care Note  Patient: Marcus [name]  Procedure(s) Performed: Procedure(s) (LRB) with comments: OPEN REDUCTION INTERNAL FIXATION (ORIF) SHOULDER FRACTURE (Left) - LEFT SHOULDER OPEN GREATER TUBEROSITY ORIF  Patient Location: PACU  Anesthesia Type: General  Level of Consciousness: sedated  Airway & Oxygen Therapy: Patient re-intubated  Post-op Assessment: Report given to PACU RN and Post -op Vital signs reviewed and stable  Post vital signs: Reviewed and stable  Complications: Patient re-intubated

## 2012-06-13 NOTE — Anesthesia Procedure Notes (Addendum)
Anesthesia Regional Block:  Interscalene brachial plexus block  Pre-Anesthetic Checklist: ,, timeout performed, Correct Patient, Correct Site, Correct Laterality, Correct Procedure, Correct Position, site marked, Risks and benefits discussed,  Surgical consent,  Pre-op evaluation,  At surgeon's request and post-op pain management  Laterality: Left  Prep: chloraprep       Needles:  Injection technique: Single-shot  Needle Type: Stimulator Needle - 40      Needle Gauge: 22 and 22 G    Additional Needles:  Procedures: nerve stimulator Interscalene brachial plexus block  Nerve Stimulator or Paresthesia:  Response: 0.48 mA,   Additional Responses:   Narrative:  Start time: 06/13/2012 10:02 AM End time: 06/13/2012 10:12 AM Injection made incrementally with aspirations every 5 mL. Anesthesiologist: Dr Gypsy Balsam  Additional Notes: 1610-9604 L ISB POP CHG prep, sterile tech #22 stim needle w/stim down to .48ma Multiple neg asp Marc .5% w/epi total 25cc+decadron 4mg  infil No compl Dr Gypsy Balsam   Procedure Name: Intubation Date/Time: 06/13/2012 10:44 AM Performed by: Ellin Goodie Pre-anesthesia Checklist: Patient identified, Emergency Drugs available, Suction available, Patient being monitored and Timeout performed Patient Re-evaluated:Patient Re-evaluated prior to inductionOxygen Delivery Method: Circle system utilized Preoxygenation: Pre-oxygenation with 100% oxygen Intubation Type: IV induction Ventilation: Mask ventilation without difficulty Laryngoscope Size: Mac and 3 Grade View: Grade I Tube type: Oral Tube size: 7.5 mm Number of attempts: 1 Airway Equipment and Method: Stylet Placement Confirmation: ETT inserted through vocal cords under direct vision,  positive ETCO2 and breath sounds checked- equal and bilateral Secured at: 23 cm Tube secured with: Tape Dental Injury: Teeth and Oropharynx as per pre-operative assessment  Comments: Intubation by Carmelina Dane,  CRNA

## 2012-06-13 NOTE — Anesthesia Preprocedure Evaluation (Addendum)
Anesthesia Evaluation  Patient identified by MRN, date of birth, ID band Patient awake and Patient confused    Reviewed: Allergy & Precautions, H&P , NPO status , Patient's Chart, lab work & pertinent test results  History of Anesthesia Complications Negative for: history of anesthetic complications  Airway Mallampati: I TM Distance: >3 FB Neck ROM: Full    Dental  (+) Teeth Intact   Pulmonary neg pulmonary ROS,  breath sounds clear to auscultation        Cardiovascular Exercise Tolerance: Good Rhythm:Regular Rate:Normal     Neuro/Psych negative neurological ROS  negative psych ROS   GI/Hepatic negative GI ROS, Neg liver ROS, hiatal hernia,   Endo/Other  Hypothyroidism   Renal/GU negative Renal ROS  negative genitourinary   Musculoskeletal negative musculoskeletal ROS (+)   Abdominal   Peds  Hematology negative hematology ROS (+)   Anesthesia Other Findings   Reproductive/Obstetrics                         Anesthesia Physical Anesthesia Plan  ASA: II  Anesthesia Plan: General   Post-op Pain Management:    Induction: Intravenous  Airway Management Planned: Oral ETT  Additional Equipment:   Intra-op Plan:   Post-operative Plan: Extubation in OR  Informed Consent: I have reviewed the patients History and Physical, chart, labs and discussed the procedure including the risks, benefits and alternatives for the proposed anesthesia with the patient or authorized representative who has indicated his/her understanding and acceptance.     Plan Discussed with: CRNA and Surgeon  Anesthesia Plan Comments:         Anesthesia Quick Evaluation

## 2012-06-13 NOTE — Progress Notes (Signed)
PT EXTUBATED BY DR. Krista Blue. SM/10L APPLIED. 95%. PT DOING WELL AT THIS TIME

## 2012-06-13 NOTE — Progress Notes (Signed)
1320-received pt in PACU, reported pt on PSV by CRNA, pt placed on PSV 10/5 at this time tolerating well, increased fio2 to 50% due to decreased spo2 86%, spo2 now 90-91% 1335- spoke with MD, no plan to extubate at this time, orders for sedation, placed pt on full support per MD order at this time, pt tolerating well, will continue to monitor

## 2012-06-13 NOTE — Interval H&P Note (Signed)
History and Physical Interval Note:  06/13/2012 9:55 AM  Scarlette Shorts  has presented today for surgery, with the diagnosis of LEFT SHOULDER FRACTURE  The various methods of treatment have been discussed with the patient and family. After consideration of risks, benefits and other options for treatment, the patient has consented to  Procedure(s) (LRB) with comments: OPEN REDUCTION INTERNAL FIXATION (ORIF) SHOULDER FRACTURE (Left) - LEFT SHOULDER OPEN GREATER TUBEROSITY ORIF as a surgical intervention .  The patient's history has been reviewed, patient examined, no change in status, stable for surgery.  I have reviewed the patient's chart and labs.  Questions were answered to the patient's satisfaction.     Marcus [name],STEVEN R

## 2012-06-13 NOTE — Brief Op Note (Signed)
06/11/2012 - 06/13/2012  11:40 AM  PATIENT:  Marcus [name]  78 y.o. male  PRE-OPERATIVE DIAGNOSIS:  LEFT SHOULDER FRACTURE, displaced greater tuberosity fx  POST-OPERATIVE DIAGNOSIS:  LEFT SHOULDER FRACTURE, displaced greater tuberosity fx  PROCEDURE:  Procedure(s) (LRB) with comments: OPEN REDUCTION INTERNAL FIXATION (ORIF) SHOULDER FRACTURE (Left) - LEFT SHOULDER OPEN GREATER TUBEROSITY ORIF  SURGEON:  Surgeon(s) and Role:    * Verlee Rossetti, MD - Primary  PHYSICIAN ASSISTANT:   ASSISTANTS: Thea Gist, PA-C   ANESTHESIA:   general  EBL:     BLOOD ADMINISTERED:none  DRAINS: none   LOCAL MEDICATIONS USED:  MARCAINE     SPECIMEN:  No Specimen  DISPOSITION OF SPECIMEN:  N/A  COUNTS:  YES  TOURNIQUET:  * No tourniquets in log *  DICTATION: .Other Dictation: Dictation Number [id number]  PLAN OF CARE: Admit to inpatient   PATIENT DISPOSITION:  PACU - hemodynamically stable.   Delay start of Pharmacological VTE agent (>24hrs) due to surgical blood loss or risk of bleeding: not applicable

## 2012-06-14 MED ORDER — VITAMIN C 500 MG PO TABS: 500.0000 mg | ORAL_TABLET | Freq: Every day | ORAL | Status: AC

## 2012-06-14 MED ORDER — DOCUSATE SODIUM 100 MG PO CAPS: 100.0000 mg | ORAL_CAPSULE | Freq: Two times a day (BID) | ORAL | Status: AC

## 2012-06-14 NOTE — Evaluation (Signed)
Occupational Therapy Evaluation Patient Details Name: Marcus [name] MRN: [id number] DOB: 09/18/1932 Today's Date: 06/14/2012 Time: 7829-5621 OT Time Calculation (min): 19 min  OT Assessment / Plan / Recommendation Clinical Impression  78 yo male s/p Lt shoulder injury with thumb amputation that is very axious to d/c from hospital. Pt educated on shoulder protcol and precautions. ALl education completed thus NO acute OT needs at this time. OT to sign off    OT Assessment  Patient does not need any further OT services    Follow Up Recommendations  Other (comment) (recommend outpatient follow up with MD appt)    Barriers to Discharge      Equipment Recommendations  None recommended by OT    Recommendations for Other Services    Frequency       Precautions / Restrictions Precautions Precautions: Shoulder Type of Shoulder Precautions: NWB LT shoulder, AROM wrist and elbow, Pendulum gentle and lap slides Precaution Comments: handout provided that list all precautions and exercises Required Braces or Orthoses: Other Brace/Splint (shoulder sling) Restrictions Weight Bearing Restrictions: No   Pertinent Vitals/Pain None report only "i am ready to go home" "okay what else"     ADL  Grooming: Simulated;Min guard Where Assessed - Grooming: Unsupported standing Toilet Transfer: Simulated;Independent Toilet Transfer Method: Sit to Barista: Raised toilet seat with arms (or 3-in-1 over toilet) (chair) Transfers/Ambulation Related to ADLs: PT ambulating around room on arrival Mod I.  ADL Comments: Pt educated on all exercises and return demonstrated at the end of session from memory. Pt educated on non weight bearing status and given specific examples (bed mobility, sit<>Stand, leaning to wiggle to front of chair). Pt has assist of ex wife and daughter at d/c home. Pt provided handouts and upon entering the second time to give pendulum handout male visitor assisting  with don of shirt and sling MOD I with patient. Pt will only have assistance for one week per patient. All education completed and pt very anxious to leave. Pt required OT to verbalize the need to see return demonstration to ensure that patient is learning the exercises to memory. pt asked basic safety questions and appears WFL cognitively    OT Diagnosis:    OT Problem List:   OT Treatment Interventions:     OT Goals    Visit Information  Last OT Received On: 06/14/12 Assistance Needed: +1    Subjective Data  Subjective: "I just had all these lines and i can't sleep like that. I am just so ready to get home."- Pt very anxious about lines that were required for patient monitoring in the PM of 06/13/12 No lines currently attached to patient Patient Stated Goal: to go home today   Prior Functioning  Vision/Perception  Home Living Lives With: Alone Available Help at Discharge:  (daughter lives near by) Type of Home: House Home Access: Level entry Home Layout: Two level;Able to live on main level with bedroom/bathroom Bathroom Shower/Tub: Walk-in shower;Door Dentist: None Prior Function Level of Independence: Independent Able to Take Stairs?: Yes Driving: Yes Vocation: Retired Musician: No difficulties Dominant Hand: Right      Cognition  Overall Cognitive Status: Appears within functional limits for tasks assessed/performed Arousal/Alertness: Awake/alert Orientation Level: Appears intact for tasks assessed Behavior During Session: [facility] for tasks performed    Extremity/Trunk Assessment Right Upper Extremity Assessment RUE ROM/Strength/Tone: Within functional levels RUE Sensation: WFL - Light Touch RUE Coordination: WFL - gross/fine motor Left Upper  Extremity Assessment LUE ROM/Strength/Tone: Deficits LUE Coordination:  (see precautions)   Mobility  Shoulder Instructions  Transfers Transfers: Sit to  Stand;Stand to Sit Sit to Stand: 6: Modified independent (Device/Increase time);From chair/3-in-1 Stand to Sit: 6: Modified independent (Device/Increase time);To chair/3-in-1 Details for Transfer Assistance: wfl for ambulation     Pendulum Lap slides Handout for exercises program given  Exercise     Balance     End of Session OT - End of Session Activity Tolerance: Patient tolerated treatment well Patient left: in chair;with call bell/phone within reach Nurse Communication: Mobility status  GO     Harrel Carina [facility] 06/14/2012, 11:04 AM Pager: [phone]
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
## 2012-06-15 NOTE — Anesthesia Postprocedure Evaluation (Signed)
  Anesthesia Post-op Note  Patient: Marcus [name]  Procedure(s) Performed: Procedure(s) (LRB) with comments: OPEN REDUCTION INTERNAL FIXATION (ORIF) SHOULDER FRACTURE (Left) - LEFT SHOULDER OPEN GREATER TUBEROSITY ORIF  Patient Location: PACU and Nursing Unit  Anesthesia Type: GA combined with regional for post-op pain  Level of Consciousness: awake and alert   Airway and Oxygen Therapy: Patient Spontanous Breathing  Post-op Pain: mild  Post-op Assessment: Post-op Vital signs reviewed, PATIENT'S CARDIOVASCULAR STATUS UNSTABLE, Respiratory Function Stable, Patent Airway, No signs of Nausea or vomiting and Pain level controlled  Post-op Vital Signs: Reviewed and stable  Complications: Patient re-intubated

## 2012-06-16 ENCOUNTER — Encounter (HOSPITAL_COMMUNITY): Payer: Self-pay | Admitting: Orthopedic Surgery

## 2012-08-22 DIAGNOSIS — F419 Anxiety disorder, unspecified: Secondary | ICD-10-CM | POA: Insufficient documentation

## 2013-06-24 ENCOUNTER — Other Ambulatory Visit (HOSPITAL_COMMUNITY): Payer: Self-pay | Admitting: *Deleted

## 2013-06-24 DIAGNOSIS — R911 Solitary pulmonary nodule: Secondary | ICD-10-CM

## 2013-07-10 ENCOUNTER — Encounter (HOSPITAL_COMMUNITY)
Admission: RE | Admit: 2013-07-10 | Discharge: 2013-07-10 | Disposition: A | Discharge status: Home / Self Care | Payer: Medicare Other | Source: Ambulatory Visit | Attending: *Deleted | Admitting: *Deleted

## 2013-07-10 DIAGNOSIS — R911 Solitary pulmonary nodule: Secondary | ICD-10-CM | POA: Insufficient documentation

## 2013-07-10 DIAGNOSIS — D1779 Benign lipomatous neoplasm of other sites: Secondary | ICD-10-CM | POA: Insufficient documentation

## 2013-07-10 DIAGNOSIS — J984 Other disorders of lung: Secondary | ICD-10-CM | POA: Insufficient documentation

## 2013-07-10 DIAGNOSIS — I2510 Atherosclerotic heart disease of native coronary artery without angina pectoris: Secondary | ICD-10-CM | POA: Insufficient documentation

## 2013-07-10 DIAGNOSIS — K5730 Diverticulosis of large intestine without perforation or abscess without bleeding: Secondary | ICD-10-CM | POA: Insufficient documentation

## 2013-07-10 DIAGNOSIS — I517 Cardiomegaly: Secondary | ICD-10-CM | POA: Insufficient documentation

## 2013-07-10 LAB — GLUCOSE, CAPILLARY: Glucose-Capillary: 98 mg/dL (ref 70–99)

## 2013-07-10 MED ORDER — FLUDEOXYGLUCOSE F - 18 (FDG) INJECTION
19.8000 | Freq: Once | INTRAVENOUS | Status: AC | PRN
  Administered 2013-07-10: 19.8 via INTRAVENOUS

## 2014-10-07 DIAGNOSIS — H04123 Dry eye syndrome of bilateral lacrimal glands: Secondary | ICD-10-CM | POA: Diagnosis not present

## 2014-10-07 DIAGNOSIS — H01009 Unspecified blepharitis unspecified eye, unspecified eyelid: Secondary | ICD-10-CM | POA: Diagnosis not present

## 2014-10-07 DIAGNOSIS — H10403 Unspecified chronic conjunctivitis, bilateral: Secondary | ICD-10-CM | POA: Diagnosis not present

## 2014-10-07 DIAGNOSIS — H16102 Unspecified superficial keratitis, left eye: Secondary | ICD-10-CM | POA: Diagnosis not present

## 2014-10-20 DIAGNOSIS — H01005 Unspecified blepharitis left lower eyelid: Secondary | ICD-10-CM | POA: Diagnosis not present

## 2014-10-20 DIAGNOSIS — G7000 Myasthenia gravis without (acute) exacerbation: Secondary | ICD-10-CM | POA: Diagnosis not present

## 2014-10-20 DIAGNOSIS — Z961 Presence of intraocular lens: Secondary | ICD-10-CM | POA: Insufficient documentation

## 2014-10-20 DIAGNOSIS — H01009 Unspecified blepharitis unspecified eye, unspecified eyelid: Secondary | ICD-10-CM | POA: Diagnosis not present

## 2014-10-20 DIAGNOSIS — H02889 Meibomian gland dysfunction of unspecified eye, unspecified eyelid: Secondary | ICD-10-CM | POA: Insufficient documentation

## 2014-10-20 DIAGNOSIS — N401 Enlarged prostate with lower urinary tract symptoms: Secondary | ICD-10-CM | POA: Insufficient documentation

## 2014-10-20 DIAGNOSIS — H0289 Other specified disorders of eyelid: Secondary | ICD-10-CM | POA: Insufficient documentation

## 2014-10-28 DIAGNOSIS — G7000 Myasthenia gravis without (acute) exacerbation: Secondary | ICD-10-CM | POA: Diagnosis not present

## 2014-10-28 DIAGNOSIS — E039 Hypothyroidism, unspecified: Secondary | ICD-10-CM | POA: Diagnosis not present

## 2014-10-28 DIAGNOSIS — R5381 Other malaise: Secondary | ICD-10-CM | POA: Diagnosis not present

## 2014-12-06 DIAGNOSIS — G7000 Myasthenia gravis without (acute) exacerbation: Secondary | ICD-10-CM | POA: Diagnosis not present

## 2014-12-07 ENCOUNTER — Other Ambulatory Visit (HOSPITAL_COMMUNITY): Payer: Self-pay | Admitting: Specialist

## 2014-12-07 DIAGNOSIS — M8580 Other specified disorders of bone density and structure, unspecified site: Secondary | ICD-10-CM

## 2014-12-15 DIAGNOSIS — E039 Hypothyroidism, unspecified: Secondary | ICD-10-CM | POA: Diagnosis not present

## 2014-12-15 DIAGNOSIS — I10 Essential (primary) hypertension: Secondary | ICD-10-CM | POA: Diagnosis not present

## 2014-12-16 ENCOUNTER — Ambulatory Visit (HOSPITAL_COMMUNITY)
Admission: RE | Admit: 2014-12-16 | Discharge: 2014-12-16 | Disposition: A | Discharge status: Home / Self Care | Payer: Medicare Other | Source: Ambulatory Visit | Attending: Specialist | Admitting: Specialist

## 2014-12-16 DIAGNOSIS — M8589 Other specified disorders of bone density and structure, multiple sites: Secondary | ICD-10-CM | POA: Diagnosis not present

## 2014-12-16 DIAGNOSIS — M8580 Other specified disorders of bone density and structure, unspecified site: Secondary | ICD-10-CM | POA: Diagnosis not present

## 2014-12-16 DIAGNOSIS — Z13820 Encounter for screening for osteoporosis: Secondary | ICD-10-CM | POA: Insufficient documentation

## 2014-12-20 DIAGNOSIS — L821 Other seborrheic keratosis: Secondary | ICD-10-CM | POA: Diagnosis not present

## 2014-12-20 DIAGNOSIS — B078 Other viral warts: Secondary | ICD-10-CM | POA: Diagnosis not present

## 2014-12-20 DIAGNOSIS — L820 Inflamed seborrheic keratosis: Secondary | ICD-10-CM | POA: Diagnosis not present

## 2015-04-11 DIAGNOSIS — R05 Cough: Secondary | ICD-10-CM | POA: Diagnosis not present

## 2015-04-11 DIAGNOSIS — J208 Acute bronchitis due to other specified organisms: Secondary | ICD-10-CM | POA: Diagnosis not present

## 2015-04-11 DIAGNOSIS — B9689 Other specified bacterial agents as the cause of diseases classified elsewhere: Secondary | ICD-10-CM | POA: Diagnosis not present

## 2015-04-13 ENCOUNTER — Inpatient Hospital Stay (HOSPITAL_COMMUNITY)
Admission: EM | Admit: 2015-04-13 | Discharge: 2015-04-17 | DRG: 871 | Disposition: A | Discharge status: Home / Self Care | Payer: Medicare Other | Attending: Internal Medicine | Admitting: Internal Medicine

## 2015-04-13 ENCOUNTER — Ambulatory Visit (INDEPENDENT_AMBULATORY_CARE_PROVIDER_SITE_OTHER): Payer: Medicare Other | Admitting: Internal Medicine

## 2015-04-13 ENCOUNTER — Ambulatory Visit (INDEPENDENT_AMBULATORY_CARE_PROVIDER_SITE_OTHER)
Admission: RE | Admit: 2015-04-13 | Discharge: 2015-04-13 | Disposition: A | Discharge status: Home / Self Care | Payer: Medicare Other | Source: Ambulatory Visit | Attending: Internal Medicine | Admitting: Internal Medicine

## 2015-04-13 ENCOUNTER — Emergency Department (HOSPITAL_COMMUNITY): Payer: Medicare Other

## 2015-04-13 ENCOUNTER — Encounter: Payer: Self-pay | Admitting: Internal Medicine

## 2015-04-13 ENCOUNTER — Encounter (HOSPITAL_COMMUNITY): Payer: Self-pay | Admitting: Family Medicine

## 2015-04-13 VITALS — BP 132/84 | HR 76 | Temp 97.9°F | Ht 68.0 in | Wt 210.4 lb

## 2015-04-13 DIAGNOSIS — J449 Chronic obstructive pulmonary disease, unspecified: Secondary | ICD-10-CM | POA: Diagnosis present

## 2015-04-13 DIAGNOSIS — R0602 Shortness of breath: Secondary | ICD-10-CM | POA: Diagnosis not present

## 2015-04-13 DIAGNOSIS — N179 Acute kidney failure, unspecified: Secondary | ICD-10-CM | POA: Diagnosis not present

## 2015-04-13 DIAGNOSIS — J9620 Acute and chronic respiratory failure, unspecified whether with hypoxia or hypercapnia: Secondary | ICD-10-CM | POA: Diagnosis present

## 2015-04-13 DIAGNOSIS — J9601 Acute respiratory failure with hypoxia: Secondary | ICD-10-CM | POA: Diagnosis present

## 2015-04-13 DIAGNOSIS — Z888 Allergy status to other drugs, medicaments and biological substances status: Secondary | ICD-10-CM | POA: Diagnosis not present

## 2015-04-13 DIAGNOSIS — A419 Sepsis, unspecified organism: Secondary | ICD-10-CM | POA: Diagnosis not present

## 2015-04-13 DIAGNOSIS — J101 Influenza due to other identified influenza virus with other respiratory manifestations: Secondary | ICD-10-CM | POA: Diagnosis present

## 2015-04-13 DIAGNOSIS — J09X1 Influenza due to identified novel influenza A virus with pneumonia: Secondary | ICD-10-CM | POA: Diagnosis present

## 2015-04-13 DIAGNOSIS — J9621 Acute and chronic respiratory failure with hypoxia: Secondary | ICD-10-CM | POA: Diagnosis not present

## 2015-04-13 DIAGNOSIS — Z87891 Personal history of nicotine dependence: Secondary | ICD-10-CM

## 2015-04-13 DIAGNOSIS — E039 Hypothyroidism, unspecified: Secondary | ICD-10-CM | POA: Diagnosis not present

## 2015-04-13 DIAGNOSIS — Z7952 Long term (current) use of systemic steroids: Secondary | ICD-10-CM | POA: Diagnosis not present

## 2015-04-13 DIAGNOSIS — Z6831 Body mass index (BMI) 31.0-31.9, adult: Secondary | ICD-10-CM

## 2015-04-13 DIAGNOSIS — J440 Chronic obstructive pulmonary disease with acute lower respiratory infection: Secondary | ICD-10-CM | POA: Diagnosis present

## 2015-04-13 DIAGNOSIS — G7000 Myasthenia gravis without (acute) exacerbation: Secondary | ICD-10-CM | POA: Diagnosis present

## 2015-04-13 DIAGNOSIS — I480 Paroxysmal atrial fibrillation: Secondary | ICD-10-CM | POA: Diagnosis present

## 2015-04-13 DIAGNOSIS — R05 Cough: Secondary | ICD-10-CM

## 2015-04-13 DIAGNOSIS — E669 Obesity, unspecified: Secondary | ICD-10-CM

## 2015-04-13 DIAGNOSIS — R059 Cough, unspecified: Secondary | ICD-10-CM

## 2015-04-13 DIAGNOSIS — J189 Pneumonia, unspecified organism: Secondary | ICD-10-CM | POA: Diagnosis not present

## 2015-04-13 DIAGNOSIS — N1831 Chronic kidney disease, stage 3a: Secondary | ICD-10-CM | POA: Diagnosis present

## 2015-04-13 DIAGNOSIS — Z79899 Other long term (current) drug therapy: Secondary | ICD-10-CM | POA: Diagnosis not present

## 2015-04-13 HISTORY — DX: Pneumonia, unspecified organism: J18.9

## 2015-04-13 HISTORY — DX: Obesity, unspecified: E66.9

## 2015-04-13 HISTORY — DX: Sepsis, unspecified organism: A41.9

## 2015-04-13 HISTORY — DX: Hypothyroidism, unspecified: E03.9

## 2015-04-13 HISTORY — DX: Myasthenia gravis without (acute) exacerbation: G70.00

## 2015-04-13 HISTORY — DX: Paroxysmal atrial fibrillation: I48.0

## 2015-04-13 HISTORY — DX: Acute and chronic respiratory failure, unspecified whether with hypoxia or hypercapnia: J96.20

## 2015-04-13 LAB — COMPREHENSIVE METABOLIC PANEL
ALK PHOS: 70 U/L (ref 38–126)
ALT: 43 U/L (ref 17–63)
AST: 46 U/L — AB (ref 15–41)
Albumin: 4 g/dL (ref 3.5–5.0)
Anion gap: 12 (ref 5–15)
BUN: 38 mg/dL — AB (ref 6–20)
CALCIUM: 9.3 mg/dL (ref 8.9–10.3)
CO2: 24 mmol/L (ref 22–32)
Chloride: 99 mmol/L — ABNORMAL LOW (ref 101–111)
Creatinine, Ser: 1.43 mg/dL — ABNORMAL HIGH (ref 0.61–1.24)
GFR calc Af Amer: 52 mL/min — ABNORMAL LOW (ref >60)
GFR calc non Af Amer: 45 mL/min — ABNORMAL LOW (ref >60)
Glucose, Bld: 131 mg/dL — ABNORMAL HIGH (ref 65–99)
Potassium: 3.9 mmol/L (ref 3.5–5.1)
Sodium: 135 mmol/L (ref 135–145)
Total Bilirubin: 1.4 mg/dL — ABNORMAL HIGH (ref 0.3–1.2)
Total Protein: 8 g/dL (ref 6.5–8.1)

## 2015-04-13 LAB — URINALYSIS, ROUTINE W REFLEX MICROSCOPIC
GLUCOSE, UA: NEGATIVE mg/dL
KETONES UR: NEGATIVE mg/dL
Nitrite: NEGATIVE
Protein, ur: 100 mg/dL — AB
SPECIFIC GRAVITY, URINE: 1.028 (ref 1.005–1.030)
Urobilinogen, UA: 1 mg/dL (ref 0.0–1.0)
pH: 5.5 (ref 5.0–8.0)

## 2015-04-13 LAB — CBC WITH DIFFERENTIAL/PLATELET
BASOS PCT: 0 % (ref 0–1)
Basophils Absolute: 0 10*3/uL (ref 0.0–0.1)
Eosinophils Absolute: 0 10*3/uL (ref 0.0–0.7)
Eosinophils Relative: 0 % (ref 0–5)
HEMATOCRIT: 41.8 % (ref 39.0–52.0)
HEMOGLOBIN: 14.6 g/dL (ref 13.0–17.0)
LYMPHS PCT: 10 % — AB (ref 12–46)
Lymphs Abs: 1.5 10*3/uL (ref 0.7–4.0)
MCH: 31.9 pg (ref 26.0–34.0)
MCHC: 34.9 g/dL (ref 30.0–36.0)
MCV: 91.5 fL (ref 78.0–100.0)
Monocytes Absolute: 0.9 10*3/uL (ref 0.1–1.0)
Monocytes Relative: 6 % (ref 3–12)
Neutro Abs: 12.7 10*3/uL — ABNORMAL HIGH (ref 1.7–7.7)
Neutrophils Relative %: 84 % — ABNORMAL HIGH (ref 43–77)
Platelets: 187 10*3/uL (ref 150–400)
RBC: 4.57 MIL/uL (ref 4.22–5.81)
RDW: 14.6 % (ref 11.5–15.5)
WBC: 15.1 10*3/uL — AB (ref 4.0–10.5)

## 2015-04-13 LAB — I-STAT CG4 LACTIC ACID, ED: Lactic Acid, Venous: 1.94 mmol/L (ref 0.5–2.0)

## 2015-04-13 LAB — URINE MICROSCOPIC-ADD ON

## 2015-04-13 MED ORDER — ENOXAPARIN SODIUM 40 MG/0.4ML ~~LOC~~ SOLN
40.0000 mg | Freq: Every day | SUBCUTANEOUS | Status: DC
  Administered 2015-04-13 – 2015-04-16 (×4): 40 mg via SUBCUTANEOUS
  Filled 2015-04-13 (×4): qty 0.4

## 2015-04-13 MED ORDER — SODIUM CHLORIDE 0.9 % IJ SOLN
3.0000 mL | Freq: Two times a day (BID) | INTRAMUSCULAR | Status: DC
  Administered 2015-04-13 – 2015-04-16 (×5): 3 mL via INTRAVENOUS

## 2015-04-13 MED ORDER — ONDANSETRON HCL 4 MG PO TABS: 4.0000 mg | ORAL_TABLET | Freq: Four times a day (QID) | ORAL | Status: DC | PRN

## 2015-04-13 MED ORDER — LEVOTHYROXINE SODIUM 25 MCG PO TABS
50.0000 ug | ORAL_TABLET | Freq: Every day | ORAL | Status: DC
  Administered 2015-04-14 – 2015-04-17 (×4): 50 ug via ORAL
  Filled 2015-04-13 (×4): qty 2

## 2015-04-13 MED ORDER — PREDNISONE 20 MG PO TABS
20.0000 mg | ORAL_TABLET | Freq: Every day | ORAL | Status: DC
  Administered 2015-04-14 – 2015-04-17 (×4): 20 mg via ORAL
  Filled 2015-04-13 (×4): qty 1

## 2015-04-13 MED ORDER — ONDANSETRON HCL 4 MG/2ML IJ SOLN: 4.0000 mg | Freq: Four times a day (QID) | INTRAMUSCULAR | Status: DC | PRN

## 2015-04-13 MED ORDER — ALBUTEROL SULFATE (2.5 MG/3ML) 0.083% IN NEBU
2.5000 mg | INHALATION_SOLUTION | Freq: Once | RESPIRATORY_TRACT | Status: AC
  Administered 2015-04-13: 2.5 mg via RESPIRATORY_TRACT

## 2015-04-13 MED ORDER — ONDANSETRON HCL 4 MG/2ML IJ SOLN: 4.0000 mg | Freq: Three times a day (TID) | INTRAMUSCULAR | Status: DC | PRN

## 2015-04-13 MED ORDER — DEXTROSE 5 % IV SOLN
500.0000 mg | INTRAVENOUS | Status: DC
  Administered 2015-04-13 – 2015-04-14 (×2): 500 mg via INTRAVENOUS
  Filled 2015-04-13 (×2): qty 500

## 2015-04-13 MED ORDER — ACETAMINOPHEN 325 MG PO TABS: 650.0000 mg | ORAL_TABLET | Freq: Four times a day (QID) | ORAL | Status: DC | PRN

## 2015-04-13 MED ORDER — DEXTROSE 5 % IV SOLN
1.0000 g | INTRAVENOUS | Status: DC
  Administered 2015-04-13 – 2015-04-14 (×2): 1 g via INTRAVENOUS
  Filled 2015-04-13 (×2): qty 10

## 2015-04-13 MED ORDER — ACETAMINOPHEN 650 MG RE SUPP: 650.0000 mg | Freq: Four times a day (QID) | RECTAL | Status: DC | PRN

## 2015-04-13 MED ORDER — LEVOFLOXACIN IN D5W 750 MG/150ML IV SOLN
750.0000 mg | Freq: Once | INTRAVENOUS | Status: DC
  Filled 2015-04-13: qty 150

## 2015-04-13 MED ORDER — PYRIDOSTIGMINE BROMIDE 60 MG PO TABS
60.0000 mg | ORAL_TABLET | Freq: Every day | ORAL | Status: DC
  Administered 2015-04-14: 60 mg via ORAL
  Filled 2015-04-13: qty 1

## 2015-04-13 NOTE — Progress Notes (Signed)
ANTIBIOTIC CONSULT NOTE - INITIAL  Pharmacy Consult for ceftriaxone Indication: CAP  Allergies  Allergen Reactions  . Statins Hives    Patient Measurements: Height: 5\' 8"  (172.7 cm) Weight: 210 lb (95.255 kg) IBW/kg (Calculated) : 68.4  Vital Signs: Temp: 98.1 F (36.7 C) (07/13 1926) Temp Source: Oral (07/13 1926) BP: 130/79 mmHg (07/13 1926) Pulse Rate: 83 (07/13 1926) Intake/Output from previous day:   Intake/Output from this shift:    Labs:  Recent Labs  04/13/15 2000  WBC 15.1*  HGB 14.6  PLT 187  CREATININE 1.43*   Estimated Creatinine Clearance: 46.2 mL/min (by C-G formula based on Cr of 1.43). No results for input(s): VANCOTROUGH, VANCOPEAK, VANCORANDOM, GENTTROUGH, GENTPEAK, GENTRANDOM, TOBRATROUGH, TOBRAPEAK, TOBRARND, AMIKACINPEAK, AMIKACINTROU, AMIKACIN in the last 72 hours.   Microbiology: No results found for this or any previous visit (from the past 720 hour(s)).  Medical History: Past Medical History  Diagnosis Date  . H/O hiatal hernia    Assessment: Patient's an 81 y.o M with hx myasthenia gravis who presents the ED with c/o SOB and cough.  CXR showed infiltrates.  To start ceftriaxone for CAP.  Plan:  - ceftriaxone 1gm IV q24h - pharmacy will sign off since no renal function is adjustment with ceftriaxone - re-consult [place] if need further assistance  Thank you for asking pharmacy to participate in this patient care.  Elayne Gruver P 04/13/2015,10:00 PM

## 2015-04-13 NOTE — H&P (Signed)
[facility] History and Physical  Marcus [name] YTK:[id number] DOB: Nov 19, 1931 DOA: 04/13/2015  Referring physician: Dr.Miller. PCP: Chesley Noon, MD  Specialists: Patient's neurologist at [facility].  Chief Complaint: Cough.  HPI: Marcus [name] is a 81 y.o. male who has just recently returned from [place] 4 days ago started experiencing persistent cough productive of sputum. Denies any chest pain. Patient also has been having mild shortness of breath. Patient had gone to his PCPs office 3 days ago and was prescribed Z-Pak and was referred to pulmonologist for concern for whooping cough as patient has had previous episode. Pulmonologist referred patient to the ER for further management. Chest x-ray at this time shows pneumonic process and patient has been admitted for further management. Patient states over the last 3 days patient also has been feeling weak. Patient has no difficulty walking or swallowing. Patient has known history of myasthenia gravis.   Review of Systems: As presented in the history of presenting illness, rest negative.  Past Medical History  Diagnosis Date  . H/O hiatal hernia    Past Surgical History  Procedure Laterality Date  . Hernia repair  1994  . Amputation  06/11/2012    Procedure: AMPUTATION DIGIT;  Surgeon: [name], MD;  Location: [place];  Service: Orthopedics;  Laterality: Left;  revision of amputation  . Shoulder closed reduction  06/11/2012    Procedure: CLOSED MANIPULATION SHOULDER;  Surgeon: [name], MD;  Location: [place];  Service: Orthopedics;  Laterality: Left;  . Orif shoulder fracture  06/13/2012    Procedure: OPEN REDUCTION INTERNAL FIXATION (ORIF) SHOULDER FRACTURE;  Surgeon: [name], MD;  Location: [place];  Service: Orthopedics;  Laterality: Left;  LEFT SHOULDER OPEN GREATER TUBEROSITY ORIF   Social History:  reports that he quit smoking about 33 years ago. His smoking use included Cigarettes. He has a 30 pack-year  smoking history. He has never used smokeless tobacco. He reports that he drinks about 1.2 oz of alcohol per week. He reports that he does not use illicit drugs. Where does patient live home. Can patient participate in ADLs? Yes.  Allergies  Allergen Reactions  . Statins Hives    Family History: History reviewed. No pertinent family history.    Prior to Admission medications   Medication Sig Start Date End Date Taking? Authorizing Provider  levothyroxine (SYNTHROID, LEVOTHROID) 50 MCG tablet Take 50 mcg by mouth daily.   Yes Historical Provider, MD  predniSONE (DELTASONE) 20 MG tablet Take 20 mg by mouth daily with breakfast.   Yes Historical Provider, MD  pyridostigmine (MESTINON) 60 MG tablet Take 60 mg by mouth daily.    Yes Historical Provider, MD    Physical Exam: Filed Vitals:   04/13/15 1926 04/13/15 1927 04/13/15 1930 04/13/15 2235  BP: 130/79     Pulse: 83   92  Temp: 98.1 F (36.7 C)   98.1 F (36.7 C)  TempSrc: Oral   Oral  Resp: 22   22  Height:   5\' 8"  (1.727 m)   Weight:   95.255 kg (210 lb)   SpO2: 89% 93% 92% 94%     General:  Moderately built and nourished.  Eyes: Anicteric no pallor. Mild discharge from the left eye.  ENT: No discharge from the ears nose and mouth.  Neck: No mass felt.  Cardiovascular: S1-S2 heard.  Respiratory: No rhonchi or crepitations.  Abdomen: Soft nontender bowel sounds present.  Skin: No rash.  Musculoskeletal: No edema.  Psychiatric: Appears normal.  Neurologic: Alert awake oriented to time place and person. Moves all extremities.  Labs on Admission:  Basic Metabolic Panel:  Recent Labs Lab 04/13/15 2000  NA 135  K 3.9  CL 99*  CO2 24  GLUCOSE 131*  BUN 38*  CREATININE 1.43*  CALCIUM 9.3   Liver Function Tests:  Recent Labs Lab 04/13/15 2000  AST 46*  ALT 43  ALKPHOS 70  BILITOT 1.4*  PROT 8.0  ALBUMIN 4.0   No results for input(s): LIPASE, AMYLASE in the last 168 hours. No results for  input(s): AMMONIA in the last 168 hours. CBC:  Recent Labs Lab 04/13/15 2000  WBC 15.1*  NEUTROABS 12.7*  HGB 14.6  HCT 41.8  MCV 91.5  PLT 187   Cardiac Enzymes: No results for input(s): CKTOTAL, CKMB, CKMBINDEX, TROPONINI in the last 168 hours.  BNP (last 3 results) No results for input(s): BNP in the last 8760 hours.  ProBNP (last 3 results) No results for input(s): PROBNP in the last 8760 hours.  CBG: No results for input(s): GLUCAP in the last 168 hours.  Radiological Exams on Admission: Dg Chest 2 View  04/13/2015   CLINICAL DATA:  Four days of cough congestion and dyspnea; history of travel to [place] 3 months ago; remote history of heavy tobacco use.  EXAM: CHEST  2 VIEW  COMPARISON:  AP portable chest x-ray of June 11, 2012  FINDINGS: The lungs are hyperinflated with hemidiaphragm flattening. There are new coarse alveolar opacities in the lingula. The heart is normal in size. The pulmonary vascularity is not engorged. The mediastinum is normal in width. There is mild deviation of the trachea toward the right by the aortic arch. This is stable. There is tortuosity of the descending thoracic aorta. The bony thorax exhibits no acute abnormality.  IMPRESSION: Lingular infiltrate superimposed upon findings of COPD. There is no CHF. Followup PA and lateral chest X-ray is recommended in 3-4 weeks following trial of antibiotic therapy to ensure resolution and exclude underlying malignancy.   Electronically Signed   By: David  Martinique M.D.   On: 04/13/2015 16:46     Assessment/Plan Principal Problem:   CAP (community acquired pneumonia) Active Problems:   Myasthenia gravis   Hypothyroidism   Pneumonia   1. Community-acquired pneumonia - patient has been placed on ceftriaxone and Zithromax after discussed with on-call neurologist Dr. Aram Beecham with regarding to patient's myasthenia gravis. Check urine Legionella last strep antigen and HIV status. Check respiratory virus  panel. Follow sputum cultures. 2. Myasthenia gravis - patient does Camillo weakness and I have discussed with on-call neurologist Dr. Aram Beecham who at this time advised to check negative inspiratory flows and vital capacity every 4 hourly and reconsult neurology in a.m. Continue present medications for myasthenia gravis. 3. Hypothyroidism - on Synthroid.   DVT Prophylaxis Lovenox.  Code Status: Full code.  Family Communication: Discussed with patient.  Disposition Plan: Admit to inpatient.    Doraine Schexnider N. [facility] Pager [phone].  If 7PM-7AM, please contact night-coverage [email or web address] Password [facility] 04/13/2015, 10:46 PM

## 2015-04-13 NOTE — ED Notes (Signed)
In to administer IV antibiotic and pt states "there are about 60 antibiotics I can't take because of my myasthenia gravis". Pt states he normally has a list but has left it at home. Dr. Sabra Heck made aware of same. Told to call pharmacy. Spoke with pharmacist on duty, will call back with more info. Pt made aware of same.

## 2015-04-13 NOTE — ED Provider Notes (Signed)
CSN: [id number]     Arrival date & time 04/13/15  1834 History   First MD Initiated Contact with Patient 04/13/15 1944     Chief Complaint  Patient presents with  . Shortness of Breath  . Cough     (Consider location/radiation/quality/duration/timing/severity/associated sxs/prior Treatment) HPI Comments: Patient is an 81 year old male, he has a history of recently traveling out of the country, no prior history of pre-existing lung disease and does not take oxygen at home. He states that he started to cough, was seen by his family doctor and placed on a Zithromax treatment, this did not improve his symptoms and he has continued to cough and feel terrible. He presented to the pulmonary offices today for a follow-up and was found have a pneumonia in the left lung on chest x-ray, sent to the hospital for further evaluation and treatment. Symptoms are persistent, gradually worsening and are now severe.  Patient is a 81 y.o. male presenting with shortness of breath and cough. The history is provided by the patient.  Shortness of Breath Associated symptoms: cough   Cough Associated symptoms: shortness of breath     Past Medical History  Diagnosis Date  . H/O hiatal hernia    Past Surgical History  Procedure Laterality Date  . Hernia repair  1994  . Amputation  06/11/2012    Procedure: AMPUTATION DIGIT;  Surgeon: [name], MD;  Location: [place];  Service: Orthopedics;  Laterality: Left;  revision of amputation  . Shoulder closed reduction  06/11/2012    Procedure: CLOSED MANIPULATION SHOULDER;  Surgeon: [name], MD;  Location: [place];  Service: Orthopedics;  Laterality: Left;  . Orif shoulder fracture  06/13/2012    Procedure: OPEN REDUCTION INTERNAL FIXATION (ORIF) SHOULDER FRACTURE;  Surgeon: [name], MD;  Location: [place];  Service: Orthopedics;  Laterality: Left;  LEFT SHOULDER OPEN GREATER TUBEROSITY ORIF   History reviewed. No pertinent family history. History   Substance Use Topics  . Smoking status: Former Smoker -- 1.00 packs/day for 30 years    Types: Cigarettes    Quit date: 01/10/1982  . Smokeless tobacco: Never Used  . Alcohol Use: 1.2 oz/week    2 Glasses of wine per week     Comment: 3-4 per week     Review of Systems  Respiratory: Positive for cough and shortness of breath.   All other systems reviewed and are negative.     Allergies  Statins  Home Medications   Prior to Admission medications   Medication Sig Start Date End Date Taking? Authorizing Provider  levothyroxine (SYNTHROID, LEVOTHROID) 50 MCG tablet Take 50 mcg by mouth daily.   Yes Historical Provider, MD  predniSONE (DELTASONE) 20 MG tablet Take 20 mg by mouth daily with breakfast.   Yes Historical Provider, MD  pyridostigmine (MESTINON) 60 MG tablet Take 60 mg by mouth daily.    Yes Historical Provider, MD   BP 130/79 mmHg  Pulse 83  Temp(Src) 98.1 F (36.7 C) (Oral)  Resp 22  Ht 5\' 8"  (1.727 m)  Wt 210 lb (95.255 kg)  BMI 31.94 kg/m2  SpO2 92% Physical Exam  Constitutional: He appears well-developed and well-nourished. No distress.  HENT:  Head: Normocephalic and atraumatic.  Mouth/Throat: Oropharynx is clear and moist. No oropharyngeal exudate.  Eyes: Conjunctivae and EOM are normal. Pupils are equal, round, and reactive to light. Right eye exhibits no discharge. Left eye exhibits no discharge. No scleral icterus.  Neck: Normal range of  motion. Neck supple. No JVD present. No thyromegaly present.  Cardiovascular: Normal rate, regular rhythm, normal heart sounds and intact distal pulses.  Exam reveals no gallop and no friction rub.   No murmur heard. Pulmonary/Chest: He is in respiratory distress. He has wheezes. He has rales.  Diffuse mild-to-moderate expiratory wheezing, rales on the left and the right at the bases, rhonchorous sounds, speaks in shortened sentences  Abdominal: Soft. Bowel sounds are normal. He exhibits no distension and no mass.  There is no tenderness.  Musculoskeletal: Normal range of motion. He exhibits no edema or tenderness.  Lymphadenopathy:    He has no cervical adenopathy.  Neurological: He is alert. Coordination normal.  Skin: Skin is warm and dry. No rash noted. No erythema.  Psychiatric: He has a normal mood and affect. His behavior is normal.  Nursing note and vitals reviewed.   ED Course  Procedures (including critical care time) Labs Review Labs Reviewed  COMPREHENSIVE METABOLIC PANEL - Abnormal; Notable for the following:    Chloride 99 (*)    Glucose, Bld 131 (*)    BUN 38 (*)    Creatinine, Ser 1.43 (*)    AST 46 (*)    Total Bilirubin 1.4 (*)    GFR calc non Af Amer 45 (*)    GFR calc Af Amer 52 (*)    All other components within normal limits  CBC WITH DIFFERENTIAL/PLATELET - Abnormal; Notable for the following:    WBC 15.1 (*)    Neutrophils Relative % 84 (*)    Lymphocytes Relative 10 (*)    Neutro Abs 12.7 (*)    All other components within normal limits  URINALYSIS, ROUTINE W REFLEX MICROSCOPIC (NOT AT [facility]) - Abnormal; Notable for the following:    Color, Urine ORANGE (*)    APPearance CLOUDY (*)    Hgb urine dipstick MODERATE (*)    Bilirubin Urine MODERATE (*)    Protein, ur 100 (*)    Leukocytes, UA TRACE (*)    All other components within normal limits  URINE MICROSCOPIC-ADD ON - Abnormal; Notable for the following:    Bacteria, UA MANY (*)    Casts HYALINE CASTS (*)    All other components within normal limits  CULTURE, BLOOD (ROUTINE X 2)  CULTURE, BLOOD (ROUTINE X 2)  URINE CULTURE  I-STAT CG4 LACTIC ACID, ED    Imaging Review Dg Chest 2 View  04/13/2015   CLINICAL DATA:  Four days of cough congestion and dyspnea; history of travel to [place] 3 months ago; remote history of heavy tobacco use.  EXAM: CHEST  2 VIEW  COMPARISON:  AP portable chest x-ray of June 11, 2012  FINDINGS: The lungs are hyperinflated with hemidiaphragm flattening. There are new  coarse alveolar opacities in the lingula. The heart is normal in size. The pulmonary vascularity is not engorged. The mediastinum is normal in width. There is mild deviation of the trachea toward the right by the aortic arch. This is stable. There is tortuosity of the descending thoracic aorta. The bony thorax exhibits no acute abnormality.  IMPRESSION: Lingular infiltrate superimposed upon findings of COPD. There is no CHF. Followup PA and lateral chest X-ray is recommended in 3-4 weeks following trial of antibiotic therapy to ensure resolution and exclude underlying malignancy.   Electronically Signed   By: David  Martinique M.D.   On: 04/13/2015 16:46      MDM   Final diagnoses:  CAP (community acquired pneumonia)    The  patient is hypoxic requiring supplemental oxygen, chest x-ray reviewed and shows a left lower lobe pneumonia. I have personally viewed and interpreted these x-rays images and I agree with the radiologist interpretation. Labs pending, Levaquin ordered, anticipate admission to the hospital for a resistant and community-acquired pneumonia.  Pt has ongoing oxygen requirement, I personally seen the chest x-ray, I have looked at his laboratory values and see there is a leukocytosis, this is all consistent with an infectious lung process, he will need admission to the hospital, discussed with the hospitalist.  Meds given in ED:  Medications  levofloxacin (LEVAQUIN) IVPB 750 mg (not administered)    New Prescriptions   No medications on file        Noemi Chapel, MD 04/13/15 2114

## 2015-04-13 NOTE — Progress Notes (Signed)
EDCM spoke to patient at bedside.  Patient confirms he lives alone at home.  Patient reports he has support at home with seven children and nine grand children.  Patient's pcp is Dr. Melford Aase in [place].  Patient does not have any medical equipment at home, no home health services.  Patient noted to be wearing oxygen in the ED, patient does not wear oxygen at home.  Patient reports he tried to see Dr. Melford Aase on Monday but was seen by his PA.  Patient is able to complete his own ADL's without difficulty.  Patient is without home health needs at this time.

## 2015-04-13 NOTE — Progress Notes (Signed)
NIF-28 and VC 1.1LPM.  Pt demonstrated with good effort and technique.  RT to monitor and assess as needed.

## 2015-04-13 NOTE — Assessment & Plan Note (Signed)
CAP with CURB-65 criteria for admit (don't have bun yet) in pt with with underlying MG so rec admit  Discussed in detail all the  indications, usual  risks and alternatives  relative to the benefits with patient who agrees to proceed with ER eval for admit rather than try to eval as outpt  Total time = 36m review case with pt/ discussion/ counseling/ giving and going over instructions (see avs)

## 2015-04-13 NOTE — ED Notes (Signed)
Patient is complaining of shortness of breath, cough with possible PNA. Pt returned from [place] on 7/06 and started having symptoms on 7/09. Pt saw his PCP on Monday, given a Z-pack. Seen Dr. Selinda Orion with [facility]. He advised to the emergency department for further treatment.

## 2015-04-13 NOTE — Patient Instructions (Signed)
I recommend you go to the [place] ER for evaluation for pneumonia especially since you also have Myasthenia

## 2015-04-13 NOTE — Progress Notes (Signed)
Subjective:     Patient ID: Marcus [name], male   DOB: Nov 30, 1931,    MRN: [id number]  HPI  89 yowm  MG quit smoking around 1976 with bad cough p got off jet around 1986 then same happened 2 days after got [place] /cough 7/9 sarted zpak 04/11/15  And referred to pulmonary clinic 04/13/2015 for refractory cough with CAP on cxr.    04/13/2015 1st  Pulmonary office visit/ Marcus [name]   Chief Complaint  Patient presents with  . Pulmonary Consult    sob, self referral, tested for Whooping Cough by PCP, Returning from [place] 16 hr flight,[place] x 3 years  followed by Dr Berdine Addison in [place] neurologist  for [facility] and feels this is stable but acutely ill as above 2 d p jet travel initially with diarrhea resolved then  severe cough no vomiting > mucus is yellow and thick and hard to cough up, can no longer lie down in bed and sleep without cough and gasping for air. No better since zpak on 7/11 but no def fever at any pont.   No obvious day to day or daytime variability or assoc cp or chest tightness, subjective wheeze or overt sinus or hb symptoms. No unusual exp hx or h/o childhood pna/ asthma or knowledge of premature birth.  Sleeping ok without nocturnal  or early am exacerbation  of respiratory  c/o's or need for noct saba. Also denies any obvious fluctuation of symptoms with weather or environmental changes or other aggravating or alleviating factors except as outlined above   Current Medications, Allergies, Complete Past Medical History, Past Surgical History, Family History, and Social History were reviewed in [employer] record.  ROS  The following are not active complaints unless bolded sore throat, dysphagia, dental problems, itching, sneezing,  nasal congestion or excess/ purulent secretions, ear ache,   fever, chills, sweats, unintended wt loss, classically pleuritic or exertional cp, hemoptysis,  orthopnea pnd or leg swelling, presyncope,  palpitations, abdominal pain, anorexia, nausea, vomiting, diarrhea  or change in bowel or bladder habits, change in stools or urine, dysuria,hematuria,  rash, arthralgias, visual complaints, headache, numbness, weakness or ataxia or problems with walking or coordination,  change in mood/affect or memory.         Review of Systems     Objective:   Physical Exam    obese amb wm with increased wob at rest/ congested rattling cough moderately improved p saba but still sob x 5 ft  Wt Readings from Last 3 Encounters:  04/13/15 210 lb 6.4 oz (95.437 kg)  06/13/12 205 lb 7.5 oz (93.2 kg)  06/11/12 202 lb (91.627 kg)    Vital signs reviewed   HEENT: nl dentition, turbinates, and orophanx. Nl external ear canals without cough reflex   NECK :  without JVD/Nodes/TM/ nl carotid upstrokes bilaterally   LUNGS: no acc muscle use, insp and exp junky rhonchi bilaterally s  Bronchial breath sounds    CV:  RRR  no s3 or murmur or increase in P2, no edema   ABD:  Obese  soft and nontender with nl excursion in the supine position. No bruits or organomegaly, bowel sounds nl  MS:  warm without deformities, calf tenderness, cyanosis or clubbing  SKIN: warm and dry without lesions    NEURO:  alert, approp, no deficits    CXR PA and Lateral:   04/13/2015 :     I personally reviewed images and agree with radiology  impression as follows:    Lingular infiltrate superimposed upon findings of COPD. There is no CHF.  Assessment:

## 2015-04-13 NOTE — Assessment & Plan Note (Addendum)
Body mass index is 32 kg/(m^2).  No results found for: TSH   Contributing to  doe/ needs to achieve and maintain neg calorie balance > f/u primary care   / already on thyroid so presume TSH updated or will be during admit

## 2015-04-14 LAB — TSH: TSH: 1.492 u[IU]/mL (ref 0.350–4.500)

## 2015-04-14 LAB — COMPREHENSIVE METABOLIC PANEL
ALT: 43 U/L (ref 17–63)
AST: 42 U/L — ABNORMAL HIGH (ref 15–41)
Albumin: 3.6 g/dL (ref 3.5–5.0)
Alkaline Phosphatase: 68 U/L (ref 38–126)
Anion gap: 12 (ref 5–15)
BILIRUBIN TOTAL: 1 mg/dL (ref 0.3–1.2)
BUN: 38 mg/dL — AB (ref 6–20)
CO2: 24 mmol/L (ref 22–32)
Calcium: 9.2 mg/dL (ref 8.9–10.3)
Chloride: 98 mmol/L — ABNORMAL LOW (ref 101–111)
Creatinine, Ser: 1.09 mg/dL (ref 0.61–1.24)
GFR calc Af Amer: >60 mL/min (ref >60)
GFR calc non Af Amer: >60 mL/min (ref >60)
Glucose, Bld: 114 mg/dL — ABNORMAL HIGH (ref 65–99)
POTASSIUM: 3.8 mmol/L (ref 3.5–5.1)
Sodium: 134 mmol/L — ABNORMAL LOW (ref 135–145)
Total Protein: 7.5 g/dL (ref 6.5–8.1)

## 2015-04-14 LAB — CBC WITH DIFFERENTIAL/PLATELET
Basophils Absolute: 0 10*3/uL (ref 0.0–0.1)
Basophils Relative: 0 % (ref 0–1)
EOS ABS: 0 10*3/uL (ref 0.0–0.7)
EOS PCT: 0 % (ref 0–5)
HEMATOCRIT: 41.2 % (ref 39.0–52.0)
Hemoglobin: 13.9 g/dL (ref 13.0–17.0)
Lymphocytes Relative: 6 % — ABNORMAL LOW (ref 12–46)
Lymphs Abs: 0.6 10*3/uL — ABNORMAL LOW (ref 0.7–4.0)
MCH: 31.6 pg (ref 26.0–34.0)
MCHC: 33.7 g/dL (ref 30.0–36.0)
MCV: 93.6 fL (ref 78.0–100.0)
MONOS PCT: 13 % — AB (ref 3–12)
Monocytes Absolute: 1.4 10*3/uL — ABNORMAL HIGH (ref 0.1–1.0)
Neutro Abs: 8.3 10*3/uL — ABNORMAL HIGH (ref 1.7–7.7)
Neutrophils Relative %: 81 % — ABNORMAL HIGH (ref 43–77)
Platelets: 189 10*3/uL (ref 150–400)
RBC: 4.4 MIL/uL (ref 4.22–5.81)
RDW: 14.7 % (ref 11.5–15.5)
WBC: 10.3 10*3/uL (ref 4.0–10.5)

## 2015-04-14 LAB — RESPIRATORY VIRUS PANEL
ADENOVIRUS: NEGATIVE
Influenza A: POSITIVE — AB
Influenza B: NEGATIVE
METAPNEUMOVIRUS: NEGATIVE
PARAINFLUENZA 1 A: NEGATIVE
PARAINFLUENZA 3 A: NEGATIVE
Parainfluenza 2: NEGATIVE
RHINOVIRUS: NEGATIVE
Respiratory Syncytial Virus A: NEGATIVE
Respiratory Syncytial Virus B: NEGATIVE

## 2015-04-14 LAB — HIV ANTIBODY (ROUTINE TESTING W REFLEX): HIV Screen 4th Generation wRfx: NONREACTIVE

## 2015-04-14 LAB — LEGIONELLA ANTIGEN, URINE

## 2015-04-14 LAB — STREP PNEUMONIAE URINARY ANTIGEN: Strep Pneumo Urinary Antigen: NEGATIVE

## 2015-04-14 MED ORDER — METOPROLOL TARTRATE 1 MG/ML IV SOLN
5.0000 mg | Freq: Once | INTRAVENOUS | Status: AC
  Administered 2015-04-14: 5 mg via INTRAVENOUS
  Filled 2015-04-14: qty 5

## 2015-04-14 MED ORDER — PYRIDOSTIGMINE BROMIDE 60 MG PO TABS
60.0000 mg | ORAL_TABLET | Freq: Four times a day (QID) | ORAL | Status: DC
  Administered 2015-04-14 – 2015-04-17 (×11): 60 mg via ORAL
  Filled 2015-04-14 (×16): qty 1

## 2015-04-14 NOTE — Progress Notes (Signed)
NIF -60, VC 1.6L  pt had good effort.

## 2015-04-14 NOTE — Progress Notes (Signed)
Patient ID: Marcus [name], male   DOB: Jan 31, 1932, 81 y.o.   MRN: [id number]  [facility] PROGRESS NOTE  [name] GBT:[id number] DOB: 1932-06-24 DOA: 04/13/2015 PCP: Marcus Noon, MD   Brief narrative:    81 y.o. male with known myasthenia gravis, has just recently returned from [place] 4 days ago (was there for ~3 months on vacation) presented to [facility] ED with main concern of few days duration of progressively dyspnea with exertion that has progressed to dyspnea at rest, associated with productive cough of clear and yellow sputum and chest tightness with coughing spells. Pt was prescribed Z-Pack by PCP 3 days ago but this has not helped with symptoms. Pt was referred to a pulmonologist and has see Dr. Melvyn Novas in the office who has referred him to ED for further evaluation.   Assessment/Plan:    Principal Problem:   Acute on chronic hypoxic respiratory failure secondary to lingular PNA in pt with known COPD - continue current ABX Zithromax and Rocephin day #2 - follow up on sputum culture, urine legionella and strep pneumo, respiratory virus panel - provide BD's scheduled and as needed  - Dr. Hal Hope d/w Dr. Armida Sans (neurologist) who recommended checking negative inspiratory flows with vital capacities every 4 hours    Sepsis secondary to CAP, lingular PNA - please note that pt meets sepsis criteria with RR up to 27, WBC 15 K, source PNA  - continue Zithromax and Rocephin  - follow upon sputum and blood cultures  - readjust the ABX regimen as clinically indicated    Myasthenia Gravis - check negative inspiratory flows and vital capacity every 4 hourly and reconsult neurology if needed - ontinue present medications for myasthenia gravis Active Problems:   Hypothyroidism - continue synthroid   Acute renal failure - pre renal etiology - IVF provided and Cr is now WNL    Obesity  -  Body mass index is 31.85 kg/(m^2).  DVT prophylaxis - Lovenox   Code Status: Full.   Family Communication:  plan of care discussed with the patient Disposition Plan: Home when stable.   IV access:  Peripheral IV  Procedures and diagnostic studies:    Dg Chest 2 View 04/13/2015   Lingular infiltrate superimposed upon findings of COPD. There is no CHF.  Medical Consultants:  None   Other Consultants:  None   IAnti-Infectives:   Zithromax 7/13 --> Rocephin 7/13 -->  [name], MD  [facility] Pager [phone]  If 7PM-7AM, please contact night-coverage [email or web address] Password [facility] 04/14/2015, 6:32 PM   LOS: 1 day   HPI/Subjective: No events overnight.   Objective: Filed Vitals:   04/14/15 0420 04/14/15 0702 04/14/15 0800 04/14/15 1432  BP: 152/91 179/105  107/69  Pulse: 95 108 137 103  Temp: 97.7 F (36.5 C) 98.8 F (37.1 C)  98 F (36.7 C)  TempSrc: Oral Oral  Oral  Resp: 22 30  20   Height:      Weight:      SpO2: 95% 94%  94%    Intake/Output Summary (Last 24 hours) at 04/14/15 1832 Last data filed at 04/14/15 1434  Gross per 24 hour  Intake   1140 ml  Output    100 ml  Net   1040 ml    Exam:   General:  Pt is alert, follows commands appropriately, not in acute distress  Cardiovascular: Regular rate and rhythm, S1/S2, no murmurs, no rubs, no gallops  Respiratory: Diminished breath sounds at bases with scattered rhonchi   Abdomen:  Soft, non tender, non distended, bowel sounds present, no guarding  Extremities: pulses DP and PT palpable bilaterally  Neuro: Grossly nonfocal  Data Reviewed: Basic Metabolic Panel:  Recent Labs Lab 04/13/15 2000 04/14/15 0416  NA 135 134*  K 3.9 3.8  CL 99* 98*  CO2 24 24  GLUCOSE 131* 114*  BUN 38* 38*  CREATININE 1.43* 1.09  CALCIUM 9.3 9.2   Liver Function Tests:  Recent Labs Lab 04/13/15 2000 04/14/15 0416  AST 46* 42*  ALT 43 43  ALKPHOS 70 68  BILITOT 1.4* 1.0  PROT 8.0 7.5  ALBUMIN 4.0 3.6   CBC:  Recent Labs Lab 04/13/15 2000 04/14/15 0416  WBC 15.1* 10.3   NEUTROABS 12.7* 8.3*  HGB 14.6 13.9  HCT 41.8 41.2  MCV 91.5 93.6  PLT 187 189    Recent Results (from the past 240 hour(s))  Culture, blood (routine x 2)     Status: None (Preliminary result)   Collection Time: 04/13/15  7:57 PM  Result Value Ref Range Status   Specimen Description BLOOD RIGHT ANTECUBITAL  Final   Special Requests BOTTLES DRAWN AEROBIC AND ANAEROBIC 5CC  Final   Culture   Final    NO GROWTH < 24 HOURS Performed at [facility]    Report Status PENDING  Incomplete  Culture, blood (routine x 2)     Status: None (Preliminary result)   Collection Time: 04/13/15  8:03 PM  Result Value Ref Range Status   Specimen Description BLOOD LEFT ANTECUBITAL  Final   Special Requests BOTTLES DRAWN AEROBIC AND ANAEROBIC 5CC  Final   Culture   Final    NO GROWTH < 24 HOURS Performed at [facility]    Report Status PENDING  Incomplete     Scheduled Meds: . azithromycin  500 mg Intravenous Q24H  . cefTRIAXone (ROCEPHIN)  IV  1 g Intravenous Q24H  . enoxaparin (LOVENOX) injection  40 mg Subcutaneous QHS  . levothyroxine  50 mcg Oral Daily  . predniSONE  20 mg Oral Q breakfast  . pyridostigmine  60 mg Oral 4 times per day  . sodium chloride  3 mL Intravenous Q12H   Continuous Infusions:

## 2015-04-14 NOTE — Progress Notes (Signed)
NIF -48 VC 2.2L. Patient gave good effort and used proper technique.

## 2015-04-14 NOTE — Progress Notes (Signed)
Noticed patient to be in A-fib HR in low 100's on the monitor. EKG obtained. BP 179/105, Oxygen sats 94% on 3 L. Patient c/o SOB while laying flat for EKG. Patient SOB improved once back to chair. Patient in no acute distress. Dr.Myers paged. On coming nurse notified of changes.

## 2015-04-15 DIAGNOSIS — J189 Pneumonia, unspecified organism: Secondary | ICD-10-CM

## 2015-04-15 DIAGNOSIS — G7000 Myasthenia gravis without (acute) exacerbation: Secondary | ICD-10-CM

## 2015-04-15 DIAGNOSIS — N179 Acute kidney failure, unspecified: Secondary | ICD-10-CM | POA: Diagnosis present

## 2015-04-15 DIAGNOSIS — N1831 Chronic kidney disease, stage 3a: Secondary | ICD-10-CM | POA: Diagnosis present

## 2015-04-15 DIAGNOSIS — J9620 Acute and chronic respiratory failure, unspecified whether with hypoxia or hypercapnia: Secondary | ICD-10-CM

## 2015-04-15 DIAGNOSIS — A419 Sepsis, unspecified organism: Secondary | ICD-10-CM

## 2015-04-15 DIAGNOSIS — E039 Hypothyroidism, unspecified: Secondary | ICD-10-CM

## 2015-04-15 DIAGNOSIS — J9601 Acute respiratory failure with hypoxia: Secondary | ICD-10-CM | POA: Diagnosis present

## 2015-04-15 DIAGNOSIS — J101 Influenza due to other identified influenza virus with other respiratory manifestations: Secondary | ICD-10-CM | POA: Diagnosis present

## 2015-04-15 DIAGNOSIS — J9621 Acute and chronic respiratory failure with hypoxia: Secondary | ICD-10-CM

## 2015-04-15 HISTORY — DX: Acute and chronic respiratory failure, unspecified whether with hypoxia or hypercapnia: J96.20

## 2015-04-15 HISTORY — DX: Acute kidney failure, unspecified: N17.9

## 2015-04-15 HISTORY — DX: Sepsis, unspecified organism: A41.9

## 2015-04-15 LAB — CBC
HEMATOCRIT: 39.4 % (ref 39.0–52.0)
Hemoglobin: 13.2 g/dL (ref 13.0–17.0)
MCH: 31.4 pg (ref 26.0–34.0)
MCHC: 33.5 g/dL (ref 30.0–36.0)
MCV: 93.8 fL (ref 78.0–100.0)
Platelets: 187 10*3/uL (ref 150–400)
RBC: 4.2 MIL/uL — ABNORMAL LOW (ref 4.22–5.81)
RDW: 14.6 % (ref 11.5–15.5)
WBC: 9 10*3/uL (ref 4.0–10.5)

## 2015-04-15 LAB — BASIC METABOLIC PANEL
Anion gap: 8 (ref 5–15)
BUN: 37 mg/dL — AB (ref 6–20)
CO2: 27 mmol/L (ref 22–32)
Calcium: 8.9 mg/dL (ref 8.9–10.3)
Chloride: 102 mmol/L (ref 101–111)
Creatinine, Ser: 1.06 mg/dL (ref 0.61–1.24)
GFR calc non Af Amer: >60 mL/min (ref >60)
GLUCOSE: 98 mg/dL (ref 65–99)
POTASSIUM: 3.5 mmol/L (ref 3.5–5.1)
SODIUM: 137 mmol/L (ref 135–145)

## 2015-04-15 LAB — EXPECTORATED SPUTUM ASSESSMENT W REFEX TO RESP CULTURE

## 2015-04-15 LAB — URINE CULTURE

## 2015-04-15 LAB — EXPECTORATED SPUTUM ASSESSMENT W GRAM STAIN, RFLX TO RESP C

## 2015-04-15 MED ORDER — AZITHROMYCIN 250 MG PO TABS: 500.0000 mg | ORAL_TABLET | Freq: Every day | ORAL | Status: DC

## 2015-04-15 MED ORDER — OSELTAMIVIR PHOSPHATE 75 MG PO CAPS
75.0000 mg | ORAL_CAPSULE | Freq: Two times a day (BID) | ORAL | Status: DC
  Administered 2015-04-15: 75 mg via ORAL
  Filled 2015-04-15 (×2): qty 1

## 2015-04-15 MED ORDER — DEXTROMETHORPHAN POLISTIREX ER 30 MG/5ML PO SUER
30.0000 mg | Freq: Two times a day (BID) | ORAL | Status: DC
  Administered 2015-04-15 – 2015-04-17 (×5): 30 mg via ORAL
  Filled 2015-04-15 (×6): qty 5

## 2015-04-15 MED ORDER — POLYVINYL ALCOHOL 1.4 % OP SOLN
1.0000 [drp] | OPHTHALMIC | Status: DC | PRN
  Administered 2015-04-15: 1 [drp] via OPHTHALMIC
  Filled 2015-04-15: qty 15

## 2015-04-15 NOTE — Evaluation (Signed)
Physical Therapy Evaluation-1x Patient Details Name: Marcus [name] MRN: [id number] DOB: 10-04-1931 Today's Date: 04/15/2015   History of Present Illness  81 yo male admitted with CAP, Afib, + flu. hx of myasthenia gravis. Pt is Ind at baseline  Clinical Impression  On eval, pt was Mod Ind for mobility-walked ~200 feet. O2 sats 94% on RA. No acute PT needs. Will sign off.     Follow Up Recommendations No PT follow up    Equipment Recommendations  None recommended by PT    Recommendations for Other Services       Precautions / Restrictions Precautions Precautions: None Restrictions Weight Bearing Restrictions: No      Mobility  Bed Mobility               General bed mobility comments: pt oob in recliner  Transfers Overall transfer level: Independent                  Ambulation/Gait Ambulation/Gait assistance: Modified independent (Device/Increase time) Ambulation Distance (Feet): 200 Feet Assistive device: None Gait Pattern/deviations: WFL(Within Functional Limits)     General Gait Details: good gait speed. O2 sats 94% on RA  Stairs            Wheelchair Mobility    Modified Rankin (Stroke Patients Only)       Balance                                             Pertinent Vitals/Pain Pain Assessment: No/denies pain    Home Living Family/patient expects to be discharged to:: Private residence Living Arrangements: Spouse/significant other   Type of Home: House Home Access: Stairs to enter   [employer] of Steps: 1 Home Layout: Two level;Able to live on main level with bedroom/bathroom Home Equipment: None      Prior Function Level of Independence: Independent               Hand Dominance        Extremity/Trunk Assessment   Upper Extremity Assessment: Overall WFL for tasks assessed           Lower Extremity Assessment: Overall WFL for tasks assessed      Cervical / Trunk  Assessment: Normal  Communication   Communication: No difficulties  Cognition Arousal/Alertness: Awake/alert Behavior During Therapy: WFL for tasks assessed/performed Overall Cognitive Status: Within Functional Limits for tasks assessed                      General Comments      Exercises        Assessment/Plan    PT Assessment Patent does not need any further PT services  PT Diagnosis     PT Problem List    PT Treatment Interventions     PT Goals (Current goals can be found in the Care Plan section) Acute Rehab PT Goals PT Goal Formulation: All assessment and education complete, DC therapy    Frequency     Barriers to discharge        Co-evaluation               End of Session   Activity Tolerance: Patient tolerated treatment well Patient left: in chair;with call bell/phone within reach;with family/visitor present           Time: 1015-1029 PT Time Calculation (min) (ACUTE ONLY): 14  min   Charges:   PT Evaluation $Initial PT Evaluation Tier I: 1 Procedure     PT G Codes:        Weston Anna, MPT Pager: [phone]

## 2015-04-15 NOTE — Progress Notes (Signed)
Pt ambulated in hall without oxygen. Pulse ox done on return to room registered at 98%,

## 2015-04-15 NOTE — Progress Notes (Signed)
NIF -60 VC 2.4l. Patient gave good effort and uses proper technique.

## 2015-04-15 NOTE — Care Management Important Message (Addendum)
Important Message  Patient Details IM LETTER GIVEN TO LAUREN/ RN TO PRESENT TO PATIENT Name: Marcus [name] MRN: [id number] Date of Birth: 24-May-1932   Medicare Important Message Given:  Yes-second notification given    Camillo Flaming 04/15/2015, 12:46 PM Important Message  Patient Details  Name: Marcus [name] MRN: [id number] Date of Birth: 08-26-1932   Medicare Important Message Given:  Yes-second notification given    Camillo Flaming 04/15/2015, 12:47 PM

## 2015-04-15 NOTE — Progress Notes (Addendum)
Progress Note   Marcus [name] NOI:[id number] DOB: 30-Jun-1932 DOA: 04/13/2015 PCP: Chesley Noon, MD   Brief Narrative:   Marcus [name] is an 81 y.o. male the PMH of myasthenia gravis was admitted 04/13/15 with pneumonia.  Assessment/Plan:   Principal Problems:  Acute on chronic hypoxic respiratory failure secondary to lingular PNA in pt with known COPD - Initially treated with Zithromax and Rocephin, stopped today when influenza A positive. Tamiflu unlikely to be helpful at this point. - Urine legionella and strep pneumo negative. - Continue BD's scheduled and as needed.  - Dr. Hal Hope d/w Dr. Armida Sans who recommended checking negative inspiratory flows with vital capacities Q 4 hours. - Incentive spirometry ordered.    Sepsis secondary to CAP, lingular PNA, influenza A - Met sepsis criteria with RR up to 27, WBC 15 K, source PNA.  - Has been on Zithromax and Rocephin, but influenza A +.     Active problems:    Myasthenia Gravis - Check negative inspiratory flows and vital capacity every 4 hourly and reconsult neurology if needed. - Continue prednisone and Mestinon.   Hypothyroidism - Continue synthroid.   Acute renal failure - Pre renal etiology. - IVF provided and Cr is now WNL.   Obesity  - Body mass index is 31.85 kg/(m^2).    DVT Prophylaxis - Lovenox ordered.  Family Communication: Wife updated at the bedside. Disposition Plan: Home when stable. Code Status:     Code Status Orders        Start     Ordered   04/13/15 2245  Full code   Continuous     04/13/15 2246    Advance Directive Documentation        Most Recent Value   Type of Advance Directive  Living will, Healthcare Power of Attorney   Pre-existing out of facility DNR order (yellow form or pink MOST form)     "MOST" Form in Place?          IV Access:    Peripheral IV   Procedures and diagnostic studies:   Dg Chest 2 View  04/13/2015   CLINICAL DATA:  Four days  of cough congestion and dyspnea; history of travel to [place] 3 months ago; remote history of heavy tobacco use.  EXAM: CHEST  2 VIEW  COMPARISON:  AP portable chest x-ray of June 11, 2012  FINDINGS: The lungs are hyperinflated with hemidiaphragm flattening. There are new coarse alveolar opacities in the lingula. The heart is normal in size. The pulmonary vascularity is not engorged. The mediastinum is normal in width. There is mild deviation of the trachea toward the right by the aortic arch. This is stable. There is tortuosity of the descending thoracic aorta. The bony thorax exhibits no acute abnormality.  IMPRESSION: Lingular infiltrate superimposed upon findings of COPD. There is no CHF. Followup PA and lateral chest X-ray is recommended in 3-4 weeks following trial of antibiotic therapy to ensure resolution and exclude underlying malignancy.   Electronically Signed   By: David  Martinique M.D.   On: 04/13/2015 16:46     Medical Consultants:    None.  Anti-Infectives:    Rocephin 04/13/15--->  Azithromycin 04/13/15--->  Tamiflu 04/15/15--->  Subjective:   Marcus [name] reports a dry cough.  Has chronic shortness of breath.  Feels unwell, but generalized weakness.  Objective:    Filed Vitals:   04/14/15 0800 04/14/15 1432 04/14/15 2254 04/15/15 0500  BP:  107/69 128/86 122/66  Pulse: 137 103  85 74  Temp:  98 F (36.7 C) 97.5 F (36.4 C) 97.5 F (36.4 C)  TempSrc:  Oral Oral Oral  Resp:  20 20 20   Height:      Weight:      SpO2:  94% 99% 94%    Intake/Output Summary (Last 24 hours) at 04/15/15 0838 Last data filed at 04/14/15 2337  Gross per 24 hour  Intake    660 ml  Output      0 ml  Net    660 ml    Exam: Gen:  NAD Cardiovascular:  RRR, No M/R/G Respiratory:  Lungs diminished Gastrointestinal:  Abdomen soft, NT/ND, + BS Extremities:  No C/E/C   Data Reviewed:    Labs: Basic Metabolic Panel:  Recent Labs Lab 04/13/15 2000 04/14/15 0416  04/15/15 0515  NA 135 134* 137  K 3.9 3.8 3.5  CL 99* 98* 102  CO2 24 24 27   GLUCOSE 131* 114* 98  BUN 38* 38* 37*  CREATININE 1.43* 1.09 1.06  CALCIUM 9.3 9.2 8.9   GFR Estimated Creatinine Clearance: 62.1 mL/min (by C-G formula based on Cr of 1.06). Liver Function Tests:  Recent Labs Lab 04/13/15 2000 04/14/15 0416  AST 46* 42*  ALT 43 43  ALKPHOS 70 68  BILITOT 1.4* 1.0  PROT 8.0 7.5  ALBUMIN 4.0 3.6   CBC:  Recent Labs Lab 04/13/15 2000 04/14/15 0416 04/15/15 0515  WBC 15.1* 10.3 9.0  NEUTROABS 12.7* 8.3*  --   HGB 14.6 13.9 13.2  HCT 41.8 41.2 39.4  MCV 91.5 93.6 93.8  PLT 187 189 187   Thyroid function studies:  Recent Labs  04/14/15 0416  TSH 1.492   Sepsis Labs:  Recent Labs Lab 04/13/15 2000 04/13/15 2011 04/14/15 0416 04/15/15 0515  WBC 15.1*  --  10.3 9.0  LATICACIDVEN  --  1.94  --   --    Microbiology Recent Results (from the past 240 hour(s))  Culture, blood (routine x 2)     Status: None (Preliminary result)   Collection Time: 04/13/15  7:57 PM  Result Value Ref Range Status   Specimen Description BLOOD RIGHT ANTECUBITAL  Final   Special Requests BOTTLES DRAWN AEROBIC AND ANAEROBIC 5CC  Final   Culture   Final    NO GROWTH < 24 HOURS Performed at [facility]    Report Status PENDING  Incomplete  Culture, blood (routine x 2)     Status: None (Preliminary result)   Collection Time: 04/13/15  8:03 PM  Result Value Ref Range Status   Specimen Description BLOOD LEFT ANTECUBITAL  Final   Special Requests BOTTLES DRAWN AEROBIC AND ANAEROBIC 5CC  Final   Culture   Final    NO GROWTH < 24 HOURS Performed at [facility]    Report Status PENDING  Incomplete  Respiratory virus panel     Status: Abnormal   Collection Time: 04/13/15 11:08 PM  Result Value Ref Range Status   Respiratory Syncytial Virus A Negative Negative Final   Respiratory Syncytial Virus B Negative Negative Final   Influenza A Positive (A)  Negative Final    Comment: Subtype: H3   Influenza B Negative Negative Final   Parainfluenza 1 Negative Negative Final   Parainfluenza 2 Negative Negative Final   Parainfluenza 3 Negative Negative Final   Metapneumovirus Negative Negative Final   Rhinovirus Negative Negative Final   Adenovirus Negative Negative Final    Comment: (NOTE) Performed At: [facility]  [place], [place] [id number] Lindon Romp MD MB:[id number]   Culture, sputum-assessment     Status: None   Collection Time: 04/15/15  7:39 AM  Result Value Ref Range Status   Specimen Description SPUTUM  Final   Special Requests NONE  Final   Sputum evaluation   Final    THIS SPECIMEN IS ACCEPTABLE. RESPIRATORY CULTURE REPORT TO FOLLOW.   Report Status 04/15/2015 FINAL  Final     Medications:   . azithromycin  500 mg Oral QHS  . cefTRIAXone (ROCEPHIN)  IV  1 g Intravenous Q24H  . enoxaparin (LOVENOX) injection  40 mg Subcutaneous QHS  . levothyroxine  50 mcg Oral Daily  . predniSONE  20 mg Oral Q breakfast  . pyridostigmine  60 mg Oral 4 times per day  . sodium chloride  3 mL Intravenous Q12H   Continuous Infusions:   Time spent: 35 minutes with > 50% of time discussing current diagnostic test results, clinical impression and plan of care.   LOS: 2 days   Lucette Kratz  [facility] Pager [phone]. If unable to reach me by pager, please call my cell phone at [phone].  *Please refer to [email or web address], password [email or web address] to get updated schedule on who will round on this patient, as hospitalists switch teams weekly. If 7PM-7AM, please contact night-coverage at [email or web address], password [email or web address] for any overnight needs.  04/15/2015, 8:38 AM

## 2015-04-15 NOTE — Progress Notes (Signed)
PHARMACIST - PHYSICIAN COMMUNICATION DR:   Rama CONCERNING: Antibiotic IV to Oral Route Change Policy  RECOMMENDATION: This patient is receiving Zithromax by the intravenous route.  Based on criteria approved by the Pharmacy and Therapeutics Committee, the antibiotic(s) is/are being converted to the equivalent oral dose form(s).   DESCRIPTION: These criteria include:  Patient being treated for a respiratory tract infection, urinary tract infection, cellulitis or clostridium difficile associated diarrhea if on metronidazole  The patient is not neutropenic and does not exhibit a GI malabsorption state  The patient is eating (either orally or via tube) and/or has been taking other orally administered medications for a least 24 hours  The patient is improving clinically and has a Tmax < 100.5  If you have questions about this conversion, please contact the Pharmacy Department  []   [phone] )  [facility] []   [phone] )  [facility] []   [phone] )  [facility] []   [phone] )  [facility] [x]   [phone] )  [place], PharmD, BCPS 04/15/2015@8 :19 AM

## 2015-04-15 NOTE — Progress Notes (Signed)
NIF -42 VC 1.9L. Patient gave good effort and used proper technique but complains of feeling "worn out" from todays events of everyone "making me breath in machines". He referred to the incentive spirometer, and states he will use it again tomorrow, but tonight he would just like to rest.

## 2015-04-15 NOTE — Progress Notes (Signed)
NP on call informed of positive Influenza A result on respiratory panel via text page.  Patient currently on droplet precautions.  Will continue to monitor patient.

## 2015-04-16 ENCOUNTER — Encounter (HOSPITAL_COMMUNITY): Payer: Self-pay | Admitting: Internal Medicine

## 2015-04-16 DIAGNOSIS — I480 Paroxysmal atrial fibrillation: Secondary | ICD-10-CM

## 2015-04-16 HISTORY — DX: Paroxysmal atrial fibrillation: I48.0

## 2015-04-16 MED ORDER — CEFUROXIME AXETIL 500 MG PO TABS
500.0000 mg | ORAL_TABLET | Freq: Two times a day (BID) | ORAL | Status: DC
  Administered 2015-04-16 – 2015-04-17 (×3): 500 mg via ORAL
  Filled 2015-04-16 (×5): qty 1

## 2015-04-16 NOTE — Progress Notes (Signed)
Received call from CCMD that patient converted into NSR around 0318. Pt stable with no complaints. Will continue to montior

## 2015-04-16 NOTE — Progress Notes (Signed)
Progress Note   Marcus [name] [id number] DOB: December 01, 1931 DOA: 04/13/2015 PCP: Chesley Noon, MD   Brief Narrative:   Marcus [name] is an 81 y.o. male the PMH of myasthenia gravis was admitted 04/13/15 with pneumonia.  Assessment/Plan:   Principal Problems:  Acute on chronic hypoxic respiratory failure secondary to lingular PNA in pt with known COPD - Initially treated with Zithromax and Rocephin, antibiotics stopped 04/15/15 when influenza A positive.  - Complains of worsening cough and feeling more tired with discontinuation of ABX, will resume Ceftin. - Urine legionella and strep pneumo negative. - Continue BD's scheduled and as needed.  - Dr. Hal Hope d/w Dr. Armida Sans who recommended checking negative inspiratory flows with vital capacities Q 4 hours. - Incentive spirometry ordered.    Sepsis secondary to CAP, lingular PNA, influenza A - Met sepsis criteria with RR up to 27, WBC 15 K, source PNA.  - Has been on Zithromax and Rocephin, but influenza A +, so antibiotics were subsequently stopped 04/15/15.   - Resume Ceftin given deterioration in condition over past 24 hours.   Active problems:   Atrial fibrillation with RVR - 12-lead EKG done on admission showed the patient to be in A. fib with RVR. - Converted to NSR overnight. Trigger was likely pneumonia. - CHADS2Vasc score only 1, would defer anti-coagulation for now, F/U PCP.     Myasthenia Gravis - Check negative inspiratory flows and vital capacity every 4 hourly and reconsult neurology if needed. - Continue prednisone and Mestinon.   Hypothyroidism - Continue synthroid.   Acute renal failure - Pre renal etiology. - IVF provided and Cr is now WNL.   Obesity  - Body mass index is 31.85 kg/(m^2).    DVT Prophylaxis - Lovenox ordered.  Family Communication: Wife updated at the bedside. Disposition Plan: Home when respiratory status stable, possibly 04/17/15. Code Status:     Code Status  Orders        Start     Ordered   04/13/15 2245  Full code   Continuous     04/13/15 2246    Advance Directive Documentation        Most Recent Value   Type of Advance Directive  Living will, Healthcare Power of Attorney   Pre-existing out of facility DNR order (yellow form or pink MOST form)     "MOST" Form in Place?          IV Access:    Peripheral IV   Procedures and diagnostic studies:   Dg Chest 2 View  04/13/2015   CLINICAL DATA:  Four days of cough congestion and dyspnea; history of travel to [place] 3 months ago; remote history of heavy tobacco use.  EXAM: CHEST  2 VIEW  COMPARISON:  AP portable chest x-ray of June 11, 2012  FINDINGS: The lungs are hyperinflated with hemidiaphragm flattening. There are new coarse alveolar opacities in the lingula. The heart is normal in size. The pulmonary vascularity is not engorged. The mediastinum is normal in width. There is mild deviation of the trachea toward the right by the aortic arch. This is stable. There is tortuosity of the descending thoracic aorta. The bony thorax exhibits no acute abnormality.  IMPRESSION: Lingular infiltrate superimposed upon findings of COPD. There is no CHF. Followup PA and lateral chest X-ray is recommended in 3-4 weeks following trial of antibiotic therapy to ensure resolution and exclude underlying malignancy.   Electronically Signed   By: David  Martinique M.D.  On: 04/13/2015 16:46     Medical Consultants:    None.  Anti-Infectives:    Rocephin 04/13/15---> 04/15/15  Azithromycin 04/13/15---> 04/15/15  Ceftin 04/16/15--->  Subjective:   Marcus [name] reports a dry cough.  Energy good yesterday, sleeping more today.    Objective:    Filed Vitals:   04/15/15 0500 04/15/15 1454 04/15/15 2012 04/16/15 0437  BP: 122/66 127/84 132/72 137/89  Pulse: 74 77 76 84  Temp: 97.5 F (36.4 C) 97.5 F (36.4 C) 97.6 F (36.4 C) 98 F (36.7 C)  TempSrc: Oral Oral Oral Oral  Resp: 20 20 18  18   Height:      Weight:      SpO2: 94% 96% 94% 95%    Intake/Output Summary (Last 24 hours) at 04/16/15 3086 Last data filed at 04/16/15 0700  Gross per 24 hour  Intake    480 ml  Output    300 ml  Net    180 ml    Exam: Gen:  NAD Cardiovascular:  RRR, No M/R/G Respiratory:  Lungs diminished Gastrointestinal:  Abdomen soft, NT/ND, + BS Extremities:  No C/E/C   Data Reviewed:    Labs: Basic Metabolic Panel:  Recent Labs Lab 04/13/15 2000 04/14/15 0416 04/15/15 0515  NA 135 134* 137  K 3.9 3.8 3.5  CL 99* 98* 102  CO2 24 24 27   GLUCOSE 131* 114* 98  BUN 38* 38* 37*  CREATININE 1.43* 1.09 1.06  CALCIUM 9.3 9.2 8.9   GFR Estimated Creatinine Clearance: 62.1 mL/min (by C-G formula based on Cr of 1.06). Liver Function Tests:  Recent Labs Lab 04/13/15 2000 04/14/15 0416  AST 46* 42*  ALT 43 43  ALKPHOS 70 68  BILITOT 1.4* 1.0  PROT 8.0 7.5  ALBUMIN 4.0 3.6   CBC:  Recent Labs Lab 04/13/15 2000 04/14/15 0416 04/15/15 0515  WBC 15.1* 10.3 9.0  NEUTROABS 12.7* 8.3*  --   HGB 14.6 13.9 13.2  HCT 41.8 41.2 39.4  MCV 91.5 93.6 93.8  PLT 187 189 187   Thyroid function studies:  Recent Labs  04/14/15 0416  TSH 1.492   Sepsis Labs:  Recent Labs Lab 04/13/15 2000 04/13/15 2011 04/14/15 0416 04/15/15 0515  WBC 15.1*  --  10.3 9.0  LATICACIDVEN  --  1.94  --   --    Microbiology Recent Results (from the past 240 hour(s))  Culture, blood (routine x 2)     Status: None (Preliminary result)   Collection Time: 04/13/15  7:57 PM  Result Value Ref Range Status   Specimen Description BLOOD RIGHT ANTECUBITAL  Final   Special Requests BOTTLES DRAWN AEROBIC AND ANAEROBIC 5CC  Final   Culture   Final    NO GROWTH 2 DAYS Performed at [facility]    Report Status PENDING  Incomplete  Culture, blood (routine x 2)     Status: None (Preliminary result)   Collection Time: 04/13/15  8:03 PM  Result Value Ref Range Status   Specimen  Description BLOOD LEFT ANTECUBITAL  Final   Special Requests BOTTLES DRAWN AEROBIC AND ANAEROBIC 5CC  Final   Culture   Final    NO GROWTH 2 DAYS Performed at [facility]    Report Status PENDING  Incomplete  Urine culture     Status: None   Collection Time: 04/13/15  8:24 PM  Result Value Ref Range Status   Specimen Description URINE, CLEAN CATCH  Final   Special Requests NONE  Final   Culture   Final    1,000 COLONIES/mL INSIGNIFICANT GROWTH Performed at [facility]    Report Status 04/15/2015 FINAL  Final  Respiratory virus panel     Status: Abnormal   Collection Time: 04/13/15 11:08 PM  Result Value Ref Range Status   Respiratory Syncytial Virus A Negative Negative Final   Respiratory Syncytial Virus B Negative Negative Final   Influenza A Positive (A) Negative Final    Comment: Subtype: H3   Influenza B Negative Negative Final   Parainfluenza 1 Negative Negative Final   Parainfluenza 2 Negative Negative Final   Parainfluenza 3 Negative Negative Final   Metapneumovirus Negative Negative Final   Rhinovirus Negative Negative Final   Adenovirus Negative Negative Final    Comment: (NOTE) Performed At: [facility] [address] [place], [place] [id number] Lindon Romp MD MH:[id number]   Culture, sputum-assessment     Status: None   Collection Time: 04/15/15  7:39 AM  Result Value Ref Range Status   Specimen Description SPUTUM  Final   Special Requests NONE  Final   Sputum evaluation   Final    THIS SPECIMEN IS ACCEPTABLE. RESPIRATORY CULTURE REPORT TO FOLLOW.   Report Status 04/15/2015 FINAL  Final     Medications:   . dextromethorphan  30 mg Oral BID  . enoxaparin (LOVENOX) injection  40 mg Subcutaneous QHS  . levothyroxine  50 mcg Oral Daily  . predniSONE  20 mg Oral Q breakfast  . pyridostigmine  60 mg Oral 4 times per day  . sodium chloride  3 mL Intravenous Q12H   Continuous Infusions:   Time spent: 25 minutes.   LOS: 3  days   [facility] Pager [phone]. If unable to reach me by pager, please call my cell phone at [phone].  *Please refer to [email or web address], password [email or web address] to get updated schedule on who will round on this patient, as hospitalists switch teams weekly. If 7PM-7AM, please contact night-coverage at [email or web address], password [email or web address] for any overnight needs.  04/16/2015, 8:12 AM

## 2015-04-16 NOTE — Progress Notes (Signed)
Patients NIF -80 and VC is 1.5. ( out of 3 tries)

## 2015-04-16 NOTE — Progress Notes (Signed)
Vital Capacity 1.4 liters, Negative inspiratory force -50. ( Best of 3 attempts)

## 2015-04-17 LAB — CULTURE, RESPIRATORY W GRAM STAIN: Culture: NORMAL

## 2015-04-17 LAB — CULTURE, RESPIRATORY

## 2015-04-17 MED ORDER — DEXTROMETHORPHAN POLISTIREX ER 30 MG/5ML PO SUER: 30.0000 mg | Freq: Two times a day (BID) | ORAL | Status: DC

## 2015-04-17 MED ORDER — CEFUROXIME AXETIL 500 MG PO TABS: 500.0000 mg | ORAL_TABLET | Freq: Two times a day (BID) | ORAL | Status: DC

## 2015-04-17 NOTE — Discharge Summary (Signed)
Physician Discharge Summary  Marcus [name] [id number]:[id number] DOB: [birth year] DOA: 04/13/2015  PCP: Chesley Noon, MD  Admit date: 04/13/2015 Discharge date: 04/17/2015   Recommendations for Outpatient Follow-Up:   1. F/U with PCP in 1 week to ensure resolution of symptoms.  Consider repeat CXR in 3-4 weeks.   Discharge Diagnosis:   Principal Problem:    Sepsis secondary to CAP Active Problems:    CAP (community acquired pneumonia)    Myasthenia gravis    Hypothyroidism    Influenza A    Acute on chronic respiratory failure    Acute renal failure    Paroxysmal a-fib   Discharge disposition:  Home.    Discharge Condition: Improved.  Diet recommendation: Low sodium, heart healthy.    History of Present Illness:   Marcus [name] is an 81 y.o. male the PMH of myasthenia gravis was admitted 04/13/15 with pneumonia.  Hospital Course by Problem:   Principal Problems:  Acute on chronic hypoxic respiratory failure secondary to lingular PNA in pt with known COPD - Initially treated with Zithromax and Rocephin, antibiotics stopped 04/15/15 when influenza A positive.  - Ceftin resumed 04/16/15 after patient complained of worsening cough.  Will be D/C'd for 5 more days of treatment. - Urine legionella and strep pneumo negative. Influenza A +. - Continue BD's scheduled and as needed.  - Vital capacities checked Q 4 hours, stable and improving at D/C. - Incentive spirometry ordered.   Sepsis secondary to CAP, lingular PNA, influenza A - Met sepsis criteria with RR up to 27, WBC 15 K, source PNA.  - Has been on Zithromax and Rocephin, but influenza A +, so antibiotics were subsequently stopped 04/15/15.  - Resumed Ceftin as noted above.  Given delay in time between symptom onset and diagnosis, Tamiflu deferred.   Active problems:  Atrial fibrillation with RVR - 12-lead EKG done on admission showed the patient to be in A. fib with RVR. - Converted to NSR 04/16/15.  Trigger was likely pneumonia. - CHADS2Vasc score only 1, would defer anti-coagulation for now, F/U PCP.   Myasthenia Gravis - Checked negative inspiratory flows and vital capacity every 4 hours, stable at D/C. - Continue prednisone and Mestinon.   Hypothyroidism - Continue synthroid.   Acute renal failure - Pre renal etiology. - IVF provided and Cr is now WNL.   Obesity  - Body mass index is 31.85 kg/(m^2).  Medical Consultants:    None.   Discharge Exam:   Filed Vitals:   04/17/15 0552  BP: 155/84  Pulse: 67  Temp: 97.9 F (36.6 C)  Resp: 20   Filed Vitals:   04/16/15 0437 04/16/15 1504 04/16/15 2152 04/17/15 0552  BP: 137/89 139/84 159/86 155/84  Pulse: 84 72 78 67  Temp: 98 F (36.7 C) 97.6 F (36.4 C) 98.1 F (36.7 C) 97.9 F (36.6 C)  TempSrc: Oral Oral Oral Oral  Resp: _0 Height:      Weight:      SpO2: 95% 92% 95% 97%    Gen:  NAD Cardiovascular:  RRR, No M/R/G Respiratory: Lungs CTAB Gastrointestinal: Abdomen soft, NT/ND with normal active bowel sounds. Extremities: No C/E/C   The results of significant diagnostics from this hospitalization (including imaging, microbiology, ancillary and laboratory) are listed below for reference.     Procedures and Diagnostic Studies:   Dg Chest 2 View  04/13/2015   CLINICAL DATA:  Four days of cough congestion and dyspnea; history of travel to [place]  3 months ago; remote history of heavy tobacco use.  EXAM: CHEST  2 VIEW  COMPARISON:  AP portable chest x-ray of June 11, 2012  FINDINGS: The lungs are hyperinflated with hemidiaphragm flattening. There are new coarse alveolar opacities in the lingula. The heart is normal in size. The pulmonary vascularity is not engorged. The mediastinum is normal in width. There is mild deviation of the trachea toward the right by the aortic arch. This is stable. There is tortuosity of the descending thoracic aorta. The bony thorax exhibits no acute  abnormality.  IMPRESSION: Lingular infiltrate superimposed upon findings of COPD. There is no CHF. Followup PA and lateral chest X-ray is recommended in 3-4 weeks following trial of antibiotic therapy to ensure resolution and exclude underlying malignancy.   Electronically Signed   By: David  Martinique M.D.   On: 04/13/2015 16:46     Labs:   Basic Metabolic Panel:  Recent Labs Lab 04/13/15 2000 04/14/15 0416 04/15/15 0515  NA 135 134* 137  K 3.9 3.8 3.5  CL 99* 98* 102  CO2 _0 GLUCOSE 131* 114* 98  BUN 38* 38* 37*  CREATININE 1.43* 1.09 1.06  CALCIUM 9.3 9.2 8.9   GFR Estimated Creatinine Clearance: 62.1 mL/min (by C-G formula based on Cr of 1.06). Liver Function Tests:  Recent Labs Lab 04/13/15 2000 04/14/15 0416  AST 46* 42*  ALT 43 43  ALKPHOS 70 68  BILITOT 1.4* 1.0  PROT 8.0 7.5  ALBUMIN 4.0 3.6   CBC:  Recent Labs Lab 04/13/15 2000 04/14/15 0416 04/15/15 0515  WBC 15.1* 10.3 9.0  NEUTROABS 12.7* 8.3*  --   HGB 14.6 13.9 13.2  HCT 41.8 41.2 39.4  MCV 91.5 93.6 93.8  PLT 187 189 187   Microbiology Recent Results (from the past 240 hour(s))  Culture, blood (routine x 2)     Status: None (Preliminary result)   Collection Time: 04/13/15  7:57 PM  Result Value Ref Range Status   Specimen Description BLOOD RIGHT ANTECUBITAL  Final   Special Requests BOTTLES DRAWN AEROBIC AND ANAEROBIC 5CC  Final   Culture   Final    NO GROWTH 3 DAYS Performed at [facility]    Report Status PENDING  Incomplete  Culture, blood (routine x 2)     Status: None (Preliminary result)   Collection Time: 04/13/15  8:03 PM  Result Value Ref Range Status   Specimen Description BLOOD LEFT ANTECUBITAL  Final   Special Requests BOTTLES DRAWN AEROBIC AND ANAEROBIC 5CC  Final   Culture   Final    NO GROWTH 3 DAYS Performed at [facility]    Report Status PENDING  Incomplete  Urine culture     Status: None   Collection Time: 04/13/15  8:24 PM  Result Value  Ref Range Status   Specimen Description URINE, CLEAN CATCH  Final   Special Requests NONE  Final   Culture   Final    1,000 COLONIES/mL INSIGNIFICANT GROWTH Performed at [facility]    Report Status 04/15/2015 FINAL  Final  Respiratory virus panel     Status: Abnormal   Collection Time: 04/13/15 11:08 PM  Result Value Ref Range Status   Respiratory Syncytial Virus A Negative Negative Final   Respiratory Syncytial Virus B Negative Negative Final   Influenza A Positive (A) Negative Final    Comment: Subtype: H3   Influenza B Negative Negative Final   Parainfluenza 1 Negative Negative Final  Parainfluenza 2 Negative Negative Final   Parainfluenza 3 Negative Negative Final   Metapneumovirus Negative Negative Final   Rhinovirus Negative Negative Final   Adenovirus Negative Negative Final    Comment: (NOTE) Performed At: [facility] [address] [place], [place] [id number] Lindon Romp MD ZD:[id number]   Culture, sputum-assessment     Status: None   Collection Time: 04/15/15  7:39 AM  Result Value Ref Range Status   Specimen Description SPUTUM  Final   Special Requests NONE  Final   Sputum evaluation   Final    THIS SPECIMEN IS ACCEPTABLE. RESPIRATORY CULTURE REPORT TO FOLLOW.   Report Status 04/15/2015 FINAL  Final  Culture, respiratory (NON-Expectorated)     Status: None   Collection Time: 04/15/15  8:00 AM  Result Value Ref Range Status   Specimen Description SPUTUM  Final   Special Requests NONE  Final   Gram Stain   Final    MODERATE WBC PRESENT,BOTH PMN AND MONONUCLEAR FEW SQUAMOUS EPITHELIAL CELLS PRESENT FEW GRAM POSITIVE COCCI IN PAIRS IN CLUSTERS FEW GRAM NEGATIVE RODS RARE GRAM POSITIVE RODS    Culture   Final    NORMAL OROPHARYNGEAL FLORA Performed at [facility]    Report Status 04/17/2015 FINAL  Final     Discharge Instructions:   Discharge Instructions    Call MD for:  difficulty breathing, headache or visual  disturbances    Complete by:  As directed      Call MD for:  extreme fatigue    Complete by:  As directed      Call MD for:  temperature >100.4    Complete by:  As directed      Diet - low sodium heart healthy    Complete by:  As directed      Discharge instructions    Complete by:  As directed   You were treated for pneumonia while you were in the hospital.  It is important that you see your PCP in follow up, and have him/her order a follow up chest x-ray in 4-6 weeks to ensure resolution of the pneumonia and to exclude any underlying pathology.  Take all of your antibiotics, as prescribed, even if you feel better.  Do not discontinue antibiotics prematurely.     Increase activity slowly    Complete by:  As directed             Medication List    TAKE these medications        cefUROXime 500 MG tablet  Commonly known as:  CEFTIN  Take 1 tablet (500 mg total) by mouth 2 (two) times daily with a meal.     dextromethorphan 30 MG/5ML liquid  Commonly known as:  DELSYM  Take 5 mLs (30 mg total) by mouth 2 (two) times daily.     levothyroxine 50 MCG tablet  Commonly known as:  SYNTHROID, LEVOTHROID  Take 50 mcg by mouth daily.     predniSONE 20 MG tablet  Commonly known as:  DELTASONE  Take 20 mg by mouth daily with breakfast.     pyridostigmine 60 MG tablet  Commonly known as:  MESTINON  Take 60 mg by mouth daily.           Follow-up Information    Follow up with BADGER,MICHAEL C, MD. Schedule an appointment as soon as possible for a visit in 1 week.   Specialty:  Family Medicine   Why:  Hospital follow up.  Contact information:   [place] [address] [phone]        Time coordinating discharge: 35 minutes.  Signed:  Consetta Cosner  Pager [phone] [facility] 04/17/2015, 10:01 AM

## 2015-04-17 NOTE — Discharge Instructions (Signed)

## 2015-04-18 LAB — CULTURE, BLOOD (ROUTINE X 2)
CULTURE: NO GROWTH
Culture: NO GROWTH

## 2015-05-02 DIAGNOSIS — B309 Viral conjunctivitis, unspecified: Secondary | ICD-10-CM | POA: Diagnosis not present

## 2015-05-04 DIAGNOSIS — J410 Simple chronic bronchitis: Secondary | ICD-10-CM | POA: Diagnosis not present

## 2015-05-04 DIAGNOSIS — J029 Acute pharyngitis, unspecified: Secondary | ICD-10-CM | POA: Diagnosis not present

## 2015-05-04 DIAGNOSIS — J189 Pneumonia, unspecified organism: Secondary | ICD-10-CM | POA: Diagnosis not present

## 2015-05-12 DIAGNOSIS — J189 Pneumonia, unspecified organism: Secondary | ICD-10-CM | POA: Diagnosis not present

## 2015-05-12 DIAGNOSIS — R911 Solitary pulmonary nodule: Secondary | ICD-10-CM | POA: Diagnosis not present

## 2015-06-23 ENCOUNTER — Ambulatory Visit (INDEPENDENT_AMBULATORY_CARE_PROVIDER_SITE_OTHER): Payer: Medicare Other | Admitting: Neurology

## 2015-06-23 ENCOUNTER — Encounter: Payer: Self-pay | Admitting: Neurology

## 2015-06-23 VITALS — BP 150/88 | HR 70 | Ht 68.0 in | Wt 225.0 lb

## 2015-06-23 DIAGNOSIS — G7000 Myasthenia gravis without (acute) exacerbation: Secondary | ICD-10-CM

## 2015-06-23 DIAGNOSIS — Z79899 Other long term (current) drug therapy: Secondary | ICD-10-CM | POA: Diagnosis not present
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
## 2015-06-23 NOTE — Progress Notes (Signed)
Note routed

## 2015-06-23 NOTE — Patient Instructions (Addendum)
1.  Reduce mestinon to 60mg  three times daily  2.  If doing well after one week, reduce prednisone to 10mg  and alternate with 5mg  daily x 1 month 3.  Call with update in 1 month 4.  Return to clinic in 2 months

## 2015-08-08 DIAGNOSIS — Z23 Encounter for immunization: Secondary | ICD-10-CM | POA: Diagnosis not present

## 2015-08-29 DIAGNOSIS — H01009 Unspecified blepharitis unspecified eye, unspecified eyelid: Secondary | ICD-10-CM | POA: Diagnosis not present

## 2015-09-21 DIAGNOSIS — H6123 Impacted cerumen, bilateral: Secondary | ICD-10-CM | POA: Diagnosis not present

## 2015-09-30 DIAGNOSIS — D2361 Other benign neoplasm of skin of right upper limb, including shoulder: Secondary | ICD-10-CM | POA: Diagnosis not present

## 2015-09-30 DIAGNOSIS — L820 Inflamed seborrheic keratosis: Secondary | ICD-10-CM | POA: Diagnosis not present

## 2015-09-30 DIAGNOSIS — D1801 Hemangioma of skin and subcutaneous tissue: Secondary | ICD-10-CM | POA: Diagnosis not present

## 2015-09-30 DIAGNOSIS — L821 Other seborrheic keratosis: Secondary | ICD-10-CM | POA: Diagnosis not present

## 2015-09-30 DIAGNOSIS — L570 Actinic keratosis: Secondary | ICD-10-CM | POA: Diagnosis not present

## 2015-09-30 DIAGNOSIS — D2362 Other benign neoplasm of skin of left upper limb, including shoulder: Secondary | ICD-10-CM | POA: Diagnosis not present

## 2015-10-05 ENCOUNTER — Ambulatory Visit (INDEPENDENT_AMBULATORY_CARE_PROVIDER_SITE_OTHER): Payer: Medicare Other | Admitting: Neurology

## 2015-10-05 ENCOUNTER — Encounter: Payer: Self-pay | Admitting: Neurology

## 2015-10-05 VITALS — BP 120/80 | HR 71 | Ht 68.0 in | Wt 216.6 lb

## 2015-10-05 DIAGNOSIS — G7000 Myasthenia gravis without (acute) exacerbation: Secondary | ICD-10-CM | POA: Diagnosis not present

## 2015-10-05 MED ORDER — PREDNISONE 5 MG PO TABS: ORAL_TABLET | ORAL | Status: DC
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
## 2015-10-05 NOTE — Patient Instructions (Addendum)
It was great to see you today!  Continue mesntin 60mg  twice daily  Reduce prednisone as follows:  January:  Alternate 5mg  (1 tablet) with 7.5mg  (1.5 tablets)  February:  Continue prednisone 5mg  daily  If you develop weakness, go back to taking the higher dose of prednisone and you may also increase mestinon 60mg  three times daily.  Return to clinic in March, or sooner as needed

## 2015-10-05 NOTE — Progress Notes (Signed)
Note routed

## 2015-11-14 ENCOUNTER — Telehealth: Payer: Self-pay | Admitting: Neurology

## 2015-11-14 ENCOUNTER — Other Ambulatory Visit: Payer: Self-pay | Admitting: *Deleted

## 2015-11-14 MED ORDER — PREDNISONE 10 MG PO TABS: 10.0000 mg | ORAL_TABLET | Freq: Every day | ORAL | Status: DC

## 2015-11-14 NOTE — Telephone Encounter (Signed)
Patient was given instructions and agrees with plan.  Rx sent in for more 10 mg tablets of prednisone.  #45 with 1 refill.

## 2015-11-14 NOTE — Telephone Encounter (Signed)
Please follow-up on patient and see how he is doing and what dose of prednisone he is taking.  He can continue 20mg  daily for two weeks, then reduce to 20mg  alternating with 10mg  daily and call with update in 1 month.   Mihir Flanigan K. Posey Pronto, DO

## 2015-12-01 DIAGNOSIS — Z0000 Encounter for general adult medical examination without abnormal findings: Secondary | ICD-10-CM | POA: Diagnosis not present

## 2015-12-13 ENCOUNTER — Ambulatory Visit (INDEPENDENT_AMBULATORY_CARE_PROVIDER_SITE_OTHER): Payer: Medicare Other | Admitting: Neurology

## 2015-12-13 ENCOUNTER — Encounter: Payer: Self-pay | Admitting: Neurology

## 2015-12-13 VITALS — BP 110/78 | HR 70 | Ht 69.0 in | Wt 208.0 lb

## 2015-12-13 DIAGNOSIS — G7001 Myasthenia gravis with (acute) exacerbation: Secondary | ICD-10-CM

## 2015-12-13 MED ORDER — PREDNISONE 10 MG PO TABS: ORAL_TABLET | ORAL | Status: DC

## 2015-12-13 NOTE — Patient Instructions (Addendum)
1.  Increase mestinon to 60mg  three times daily 2.  Increase prednisone 10mg  daily
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
## 2015-12-14 NOTE — Progress Notes (Signed)
Note routed

## 2015-12-15 ENCOUNTER — Ambulatory Visit (INDEPENDENT_AMBULATORY_CARE_PROVIDER_SITE_OTHER): Payer: Medicare Other | Admitting: Internal Medicine

## 2015-12-15 ENCOUNTER — Ambulatory Visit (INDEPENDENT_AMBULATORY_CARE_PROVIDER_SITE_OTHER)
Admission: RE | Admit: 2015-12-15 | Discharge: 2015-12-15 | Disposition: A | Discharge status: Home / Self Care | Payer: Medicare Other | Source: Ambulatory Visit | Attending: Internal Medicine | Admitting: Internal Medicine

## 2015-12-15 ENCOUNTER — Encounter: Payer: Self-pay | Admitting: Internal Medicine

## 2015-12-15 VITALS — BP 136/84 | HR 62 | Temp 97.9°F | Ht 68.0 in | Wt 206.0 lb

## 2015-12-15 DIAGNOSIS — J45901 Unspecified asthma with (acute) exacerbation: Secondary | ICD-10-CM | POA: Diagnosis not present

## 2015-12-15 DIAGNOSIS — R05 Cough: Secondary | ICD-10-CM | POA: Diagnosis not present

## 2015-12-15 DIAGNOSIS — R0602 Shortness of breath: Secondary | ICD-10-CM | POA: Diagnosis not present

## 2015-12-15 DIAGNOSIS — J45909 Unspecified asthma, uncomplicated: Secondary | ICD-10-CM | POA: Insufficient documentation

## 2015-12-15 MED ORDER — LEVOFLOXACIN 500 MG PO TABS: 500.0000 mg | ORAL_TABLET | Freq: Every day | ORAL | Status: DC

## 2015-12-15 MED ORDER — MOMETASONE FURO-FORMOTEROL FUM 100-5 MCG/ACT IN AERO: INHALATION_SPRAY | RESPIRATORY_TRACT | Status: DC

## 2015-12-15 MED ORDER — ALBUTEROL SULFATE (2.5 MG/3ML) 0.083% IN NEBU
2.5000 mg | INHALATION_SOLUTION | Freq: Once | RESPIRATORY_TRACT | Status: AC
  Administered 2015-12-15: 2.5 mg via RESPIRATORY_TRACT

## 2015-12-15 NOTE — Patient Instructions (Signed)
dulera 100 Take 2 puffs first thing in am and then another 2 puffs about 12 hours later.   For cough / congestion mucinex dm 1200 mg every 12 hours  Levaquin 500mg  daily x 7 days  Please schedule a follow up office visit in 2 weeks, sooner if needed with cxr - to ER in meantime if condition worsens in any way

## 2015-12-15 NOTE — Progress Notes (Signed)
Subjective:     Patient ID: Marcus [name], male   DOB: September 04, 1932     MRN: [id number]    Brief patient profile:  82 yowm   MG on daily prednisone since 2013 quit smoking around 1976 with bad cough p got off jet around 1986 then same happened 2 days after got off jet coming back from [place] onset of diarrhea /cough 7/9 sarted zpak 04/11/15  And referred to pulmonary clinic 04/13/2015 for refractory cough with CAP on cxr.    History of Present Illness  04/13/2015 1st [facility] office visit/ Kjerstin Abrigo   Chief Complaint  Patient presents with  . Pulmonary Consult    sob, self referral, tested for Whooping Cough by PCP, Returning from [place] 16 hr flight,MG x 3 years  followed by Dr Posey Pronto neurology  for [facility] and feels this is stable but acutely ill as above 2 d p jet travel initially with diarrhea resolved then  severe cough no vomiting > mucus is yellow and thick and hard to cough up, can no longer lie down in bed and sleep without cough and gasping for air. No better since zpak on 7/11 but no def fever at any pont.  Dx CAP  rec Admit [facility] > all better   Admit date: 04/13/2015 Discharge date: 04/17/2015   Recommendations for Outpatient Follow-Up:   1. F/U with PCP in 1 week to ensure resolution of symptoms. Consider repeat CXR in 3-4 weeks.   Discharge Diagnosis:   Principal Problem:   Sepsis secondary to CAP Active Problems:   CAP (community acquired pneumonia)   Myasthenia gravis   Hypothyroidism   Influenza A   Acute on chronic respiratory failure   Acute renal failure   Paroxysmal a-fib        12/15/2015 acute extended ov/Elmore Hyslop re: cough /sob on prednisone 20 x one week  prior to [facility] Complaint  Patient presents with  . Acute Visit    Pt c/o sob and cough x 3 days. Cough is prod with white sputum.   acute onset s travel this episode, comfortable at rest though more difficult in supine position due to sob/cough  last night first time this  happened since dx of CAP but comfortable at rest sitting.  No obvious day to day or daytime variability or assoc cp or chest tightness, subjective wheeze or overt sinus or hb symptoms. No unusual exp hx or h/o childhood pna/ asthma or knowledge of premature birth.  Sleeping ok without nocturnal  or early am exacerbation  of respiratory  c/o's or need for noct saba. Also denies any obvious fluctuation of symptoms with weather or environmental changes or other aggravating or alleviating factors except as outlined above   Current Medications, Allergies, Complete Past Medical History, Past Surgical History, Family History, and Social History were reviewed in [employer] record.  ROS  The following are not active complaints unless bolded sore throat, dysphagia, dental problems, itching, sneezing,  nasal congestion or excess/ purulent secretions, ear ache,   fever, chills, sweats, unintended wt loss, classically pleuritic or exertional cp, hemoptysis,  orthopnea pnd or leg swelling, presyncope, palpitations, abdominal pain, anorexia, nausea, vomiting, diarrhea  or change in bowel or bladder habits, change in stools or urine, dysuria,hematuria,  rash, arthralgias, visual complaints, headache x R side numbness, weakness or ataxia or problems with walking or coordination,  change in mood/affect or memory.  Objective:   Physical Exam    obese amb wm with  Congested rattly cough    12/15/2015       206   04/13/15 210 lb 6.4 oz (95.437 kg)  06/13/12 205 lb 7.5 oz (93.2 kg)  06/11/12 202 lb (91.627 kg)    Vital signs reviewed   HEENT: nl dentition, turbinates, and orophanx. Nl external ear canals without cough reflex   NECK :  without JVD/Nodes/TM/ nl carotid upstrokes bilaterally   LUNGS: no acc muscle use, bilateral insp and exp sonorous rhonchi prior to neb saba> cleared p rx    CV:  RRR  no s3 or murmur or increase in P2, no edema   ABD:  Obese  soft  and nontender with nl excursion in the supine position. No bruits or organomegaly, bowel sounds nl  MS:  warm without deformities, calf tenderness, cyanosis or clubbing  SKIN: warm and dry without lesions    NEURO:  alert, approp, no deficits    CXR PA and Lateral:   12/15/2015 :    I personally reviewed images and agree with radiology impression as follows:    Left basilar opacity is noted most consistent with scarring or subsegmental atelectasis.  Nodular density is seen on lateral projection anteriorly which may correspond to nodule seen on prior PET scan.       Assessment:

## 2015-12-16 ENCOUNTER — Telehealth: Payer: Self-pay | Admitting: Internal Medicine

## 2015-12-16 ENCOUNTER — Telehealth: Payer: Self-pay | Admitting: *Deleted

## 2015-12-16 NOTE — Telephone Encounter (Signed)
Patient's ex-wife called stating that patient saw his pulmonologist yesterday and was put on antibiotics and inhalers.  He is having trouble breathing and holding his head up.  Instructed her to take him to the ER since he may be having a myasthenia gravis crisis.  She is going to call pulmonologist first and then take him.

## 2015-12-16 NOTE — Telephone Encounter (Signed)
As long as comfortable at rest p using saba no need to go to hospital but if not then only option is admit which I don't do and our inpt service would be consulted by ER docs or [facility] on prn basis p they assess first

## 2015-12-16 NOTE — Telephone Encounter (Signed)
Called patient and he said that he is feeling better now.  Instructed him to go to ER over the weekend if necessary.  Patient agreed with plan.

## 2015-12-16 NOTE — Telephone Encounter (Signed)
Noted  

## 2015-12-16 NOTE — Telephone Encounter (Signed)
Called and spoke with [name]. Reviewed recs. She voiced understanding and had no further questions. Nothing further needed at this time.

## 2015-12-16 NOTE — Telephone Encounter (Signed)
Spoke with pt's ex wife [name] (dpr on file), states pt had 3 bouts of sob, almost like a panic attack- was sob, shaking all over.  Pt used rescue inhaler which helped s/s.  [name] is concerned that pt will "go into crisis with myasthenia gravis" over the weekend, requesting MW's recs.  Pt's ex wife called neurology who treats pt's myasthenia gravis-states they do not admit pts to hospital, but advised that pt needs to go to hospital.  Pt does not wish to go to hospital, is requesting MW's recs.  Want to know if pt needs to go to hospital if MW will admit them.  Pt has not called PCP for this problem.   MW please advise.  Thanks.   Patient Instructions       dulera 100 Take 2 puffs first thing in am and then another 2 puffs about 12 hours later.   For cough / congestion mucinex dm 1200 mg every 12 hours  Levaquin 500mg  daily x 7 days  Please schedule a follow up office visit in 2 weeks, sooner if needed with cxr - to ER in meantime if condition worsens in any way

## 2015-12-17 ENCOUNTER — Encounter: Payer: Self-pay | Admitting: Internal Medicine

## 2015-12-17 NOTE — Assessment & Plan Note (Addendum)
Acute onset in setting of uri/ probably viral though hard to r/o early bronchopneumoina > good news is that Wheezing cleared with one saba neb here in the office and doesn't have apparent/significant underlying lung dz despite smoking hx and prev pna  - The proper method of use, as well as anticipated side effects, of a metered-dose inhaler are discussed and demonstrated to the patient. Improved effectiveness after extensive coaching during this visit to a level of approximately 75 % from a baseline of 50 % so try dulera 100 2bid   Can't exclude early cap so rx levaquin 500 mg daily   Concerned about MG and ability to do good cough mechanics as well as insp muscle reserve so rec he go to er immediately if conditions worsens/can't get comfortable in upright position  I had an extended discussion with the patient reviewing all relevant studies completed to date and  lasting 25  minutes of a 40 minute acute  visit    Each maintenance medication was reviewed in detail including most importantly the difference between maintenance and prns and under what circumstances the prns are to be triggered using an action plan format that is not reflected in the computer generated alphabetically organized AVS.    Please see instructions for details which were reviewed in writing and the patient given a copy highlighting the part that I personally wrote and discussed at today's ov.

## 2015-12-19 ENCOUNTER — Ambulatory Visit (INDEPENDENT_AMBULATORY_CARE_PROVIDER_SITE_OTHER): Payer: Medicare Other | Admitting: Neurology

## 2015-12-19 ENCOUNTER — Telehealth: Payer: Self-pay | Admitting: *Deleted

## 2015-12-19 ENCOUNTER — Encounter: Payer: Self-pay | Admitting: Neurology

## 2015-12-19 VITALS — BP 140/90 | HR 86 | Ht 68.0 in | Wt 197.2 lb

## 2015-12-19 DIAGNOSIS — G7001 Myasthenia gravis with (acute) exacerbation: Secondary | ICD-10-CM

## 2015-12-19 MED ORDER — PREDNISONE 20 MG PO TABS: 40.0000 mg | ORAL_TABLET | Freq: Every day | ORAL | Status: DC

## 2015-12-19 NOTE — Patient Instructions (Addendum)
1.  Increase prednisone to 40mg  daily 2.  Continue mestinon 60mg  three times daily 3.  Return to clinic 1 week
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
## 2015-12-19 NOTE — Telephone Encounter (Signed)
Patient called c/o SOB and wants to see Dr. Posey Pronto right away.   Instructed him to go to the ER if he felt like he could not breathe.  He refused to go to ER.  I spoke Dr. Posey Pronto and she agreed to see him at 8:30 tomorrow morning.  I called patient back and he accepted the appointment.  I reminded him that MG symptoms are worse when an infection is present which he currently has.  Instructed him to try and relax today and continue medications as ordered.

## 2015-12-19 NOTE — Progress Notes (Signed)
Note routed

## 2015-12-20 ENCOUNTER — Ambulatory Visit: Payer: Medicare Other | Admitting: Neurology

## 2015-12-26 ENCOUNTER — Ambulatory Visit (INDEPENDENT_AMBULATORY_CARE_PROVIDER_SITE_OTHER): Payer: Medicare Other | Admitting: Neurology

## 2015-12-26 ENCOUNTER — Encounter: Payer: Self-pay | Admitting: Neurology

## 2015-12-26 VITALS — BP 140/80 | HR 79 | Ht 68.0 in | Wt 201.0 lb

## 2015-12-26 DIAGNOSIS — G7000 Myasthenia gravis without (acute) exacerbation: Secondary | ICD-10-CM
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
## 2015-12-26 NOTE — Progress Notes (Signed)
Note routed

## 2015-12-26 NOTE — Patient Instructions (Signed)
Take prednisone as follows:  prednisone to 40mg  for one week, then reduce 35mg  daily for two weeks, then continue 30mg  daily until I see you again  Return to clinic 6-8 weeks

## 2016-01-03 ENCOUNTER — Ambulatory Visit (INDEPENDENT_AMBULATORY_CARE_PROVIDER_SITE_OTHER): Payer: Medicare Other | Admitting: Internal Medicine

## 2016-01-03 ENCOUNTER — Encounter: Payer: Self-pay | Admitting: Internal Medicine

## 2016-01-03 VITALS — BP 148/88 | HR 69 | Ht 68.0 in | Wt 209.0 lb

## 2016-01-03 DIAGNOSIS — J189 Pneumonia, unspecified organism: Secondary | ICD-10-CM | POA: Diagnosis not present

## 2016-01-03 DIAGNOSIS — J45901 Unspecified asthma with (acute) exacerbation: Secondary | ICD-10-CM

## 2016-01-03 DIAGNOSIS — G7000 Myasthenia gravis without (acute) exacerbation: Secondary | ICD-10-CM

## 2016-01-03 NOTE — Patient Instructions (Signed)
Keep the dulera 100 sample on hand for recurrent cough or wheeze or short of breath and call me if start needing it for follow up appt  Work on maintaining perfect inhaler technique:  relax and gently blow all the way out then take a nice smooth deep breath back in, triggering the inhaler at same time you start breathing in.  Hold for up to 5 seconds if you can. Blow out thru nose. Rinse and gargle with water when done  Keep track of your inspiratory capacity on your incentive spirometer so you can communicate with your neurologist if you start noting a decline which would likely be related to MG

## 2016-01-03 NOTE — Progress Notes (Signed)
Subjective:     Patient ID: Marcus [name], male   DOB: 08/08/1932     MRN: [id number]    Brief patient profile:  82 yowm   MG on daily prednisone since 2013 quit smoking around 1976 with bad cough p got off jet around 1986 then same happened 2 days after got off jet coming back from [place] onset of diarrhea /cough 7/9 sarted zpak 04/11/15  And referred to pulmonary clinic 04/13/2015 for refractory cough with CAP on cxr.    History of Present Illness  04/13/2015 1st [facility] office visit/ [facility]   Chief Complaint  Patient presents with  . Pulmonary Consult    sob, self referral, tested for Whooping Cough by PCP, Returning from [place] 16 hr flight,MG x 3 years  followed by Dr Posey Pronto neurology  for [facility] and feels this is stable but acutely ill as above 2 d p jet travel initially with diarrhea resolved then  severe cough no vomiting > mucus is yellow and thick and hard to cough up, can no longer lie down in bed and sleep without cough and gasping for air. No better since zpak on 7/11 but no def fever at any pont.  Dx CAP  rec Admit [facility] > all better   Admit date: 04/13/2015 Discharge date: 04/17/2015   Recommendations for Outpatient Follow-Up:   1. F/U with PCP in 1 week to ensure resolution of symptoms. Consider repeat CXR in 3-4 weeks.   Discharge Diagnosis:   Principal Problem:   Sepsis secondary to CAP Active Problems:   CAP (community acquired pneumonia)   Myasthenia gravis   Hypothyroidism   Influenza A   Acute on chronic respiratory failure   Acute renal failure   Paroxysmal a-fib        12/15/2015 acute extended ov/[facility] re: cough /sob on prednisone 20 x one week  prior to [place] Complaint  Patient presents with  . Acute Visit    Pt c/o sob and cough x 3 days. Cough is prod with white sputum.   acute onset s travel this episode, comfortable at rest though more difficult in supine position due to sob/cough  last night first time this  happened since dx of CAP but comfortable at rest sitting. rec dulera 100 Take 2 puffs first thing in am and then another 2 puffs about 12 hours later.  For cough / congestion mucinex dm 1200 mg every 12 hours Levaquin 500mg  daily x 7 days   01/03/2016  f/u ov/[facility] re: s/p acute ab/ off dulera x one week s flare  Chief Complaint  Patient presents with  . Follow-up    Breathing has improved back to his normal baseline. No new co's today.    Not limited by breathing from desired activities     No obvious day to day or daytime variability or assoc excess/ purulent sputum or mucus plugs   cp or chest tightness, subjective wheeze or overt sinus or hb symptoms. No unusual exp hx or h/o childhood pna/ asthma or knowledge of premature birth.  Sleeping ok without nocturnal  or early am exacerbation  of respiratory  c/o's or need for noct saba. Also denies any obvious fluctuation of symptoms with weather or environmental changes or other aggravating or alleviating factors except as outlined above   Current Medications, Allergies, Complete Past Medical History, Past Surgical History, Family History, and Social History were reviewed in [employer] record.  ROS  The following are not  active complaints unless bolded sore throat, dysphagia, dental problems, itching, sneezing,  nasal congestion or excess/ purulent secretions, ear ache,   fever, chills, sweats, unintended wt loss, classically pleuritic or exertional cp, hemoptysis,  orthopnea pnd or leg swelling, presyncope, palpitations, abdominal pain, anorexia, nausea, vomiting, diarrhea  or change in bowel or bladder habits, change in stools or urine, dysuria,hematuria,  rash, arthralgias, visual complaints, headache x R side numbness, weakness or ataxia or problems with walking or coordination,  change in mood/affect or memory.              Objective:   Physical Exam    obese amb wm  Better cough mechanics  01/03/2016           209   12/15/2015       206   04/13/15 210 lb 6.4 oz (95.437 kg)  06/13/12 205 lb 7.5 oz (93.2 kg)  06/11/12 202 lb (91.627 kg)    Vital signs reviewed   HEENT: nl dentition, turbinates, and orophanx. Nl external ear canals without cough reflex   NECK :  without JVD/Nodes/TM/ nl carotid upstrokes bilaterally   LUNGS: no acc muscle use, completely clear to A and P    CV:  RRR  no s3 or murmur or increase in P2, no edema   ABD:  Obese  soft and nontender with nl excursion in the supine position. No bruits or organomegaly, bowel sounds nl  MS:  warm without deformities, calf tenderness, cyanosis or clubbing  SKIN: warm and dry without lesions    NEURO:  alert, approp, no deficits    CXR PA and Lateral:   12/15/2015 :    I personally reviewed images and agree with radiology impression as follows:    Left basilar opacity is noted most consistent with scarring or subsegmental atelectasis.        Assessment:

## 2016-01-03 NOTE — Assessment & Plan Note (Signed)
-   The proper method of use, as well as anticipated side effects, of a metered-dose inhaler are discussed and demonstrated to the patient. Improved effectiveness after extensive coaching during this visit to a level of approximately 90 % from a baseline of 75 %    He has completely cleared with rx of bronchopna but is at risk of recurrence with limited ventilatory reserve secondary to MG and obesity   I had an extended final summary discussion with the patient reviewing all relevant studies completed to date and  lasting 15 to 20 minutes of a 25 minute visit on the following issues:    Keep dulera on hand to use @ 100 2bid if recurrent symptoms and call for f/u  Keep track of IC on home IS   Each maintenance medication was reviewed in detail including most importantly the difference between maintenance and as needed and under what circumstances the prns are to be used.  Please see instructions for details which were reviewed in writing and the patient given a copy.

## 2016-01-03 NOTE — Assessment & Plan Note (Signed)
Advised to monitor IC by IS

## 2016-01-03 NOTE — Assessment & Plan Note (Signed)
No evidence of recurrent pna/ no need for further dedicated f/u

## 2016-01-05 ENCOUNTER — Ambulatory Visit: Payer: Medicare Other | Admitting: Neurology

## 2016-01-11 DIAGNOSIS — G7000 Myasthenia gravis without (acute) exacerbation: Secondary | ICD-10-CM | POA: Diagnosis not present

## 2016-01-11 DIAGNOSIS — N401 Enlarged prostate with lower urinary tract symptoms: Secondary | ICD-10-CM | POA: Diagnosis not present

## 2016-01-11 DIAGNOSIS — E039 Hypothyroidism, unspecified: Secondary | ICD-10-CM | POA: Diagnosis not present

## 2016-01-11 DIAGNOSIS — N179 Acute kidney failure, unspecified: Secondary | ICD-10-CM | POA: Diagnosis not present

## 2016-01-17 DIAGNOSIS — G7000 Myasthenia gravis without (acute) exacerbation: Secondary | ICD-10-CM | POA: Diagnosis not present

## 2016-01-17 DIAGNOSIS — N400 Enlarged prostate without lower urinary tract symptoms: Secondary | ICD-10-CM | POA: Diagnosis not present

## 2016-01-17 DIAGNOSIS — Z0000 Encounter for general adult medical examination without abnormal findings: Secondary | ICD-10-CM | POA: Diagnosis not present

## 2016-01-17 DIAGNOSIS — E039 Hypothyroidism, unspecified: Secondary | ICD-10-CM | POA: Diagnosis not present

## 2016-02-08 ENCOUNTER — Telehealth: Payer: Self-pay | Admitting: Neurology

## 2016-02-08 NOTE — Telephone Encounter (Signed)
Patient is going to increase prednisone to 60 and keep his appointment on the 26th.

## 2016-02-08 NOTE — Telephone Encounter (Signed)
Please inform patient that I reviewed his labs, which look good.  I am sorry that he is not feeling back to his baseline and still very tired.  We can increase his prednisone to 60mg  daily for 2 weeks OR he can continued 40mg  and I will reassess him at his f/u on 5/26.    Please also express my appreciation for the biscotti!  Donika K. Posey Pronto, DO

## 2016-02-08 NOTE — Telephone Encounter (Signed)
Noted  

## 2016-02-24 ENCOUNTER — Encounter: Payer: Self-pay | Admitting: Neurology

## 2016-02-24 ENCOUNTER — Other Ambulatory Visit (INDEPENDENT_AMBULATORY_CARE_PROVIDER_SITE_OTHER): Payer: Medicare Other

## 2016-02-24 ENCOUNTER — Ambulatory Visit (INDEPENDENT_AMBULATORY_CARE_PROVIDER_SITE_OTHER): Payer: Medicare Other | Admitting: Neurology

## 2016-02-24 VITALS — BP 160/90 | HR 69 | Ht 68.0 in | Wt 220.1 lb

## 2016-02-24 DIAGNOSIS — G7000 Myasthenia gravis without (acute) exacerbation: Secondary | ICD-10-CM | POA: Diagnosis not present

## 2016-02-24 DIAGNOSIS — E538 Deficiency of other specified B group vitamins: Secondary | ICD-10-CM

## 2016-02-24 LAB — VITAMIN B12: VITAMIN B 12: 185 pg/mL — AB (ref 211–911)

## 2016-02-24 MED ORDER — PREDNISONE 10 MG PO TABS: 30.0000 mg | ORAL_TABLET | Freq: Every day | ORAL | Status: DC

## 2016-02-24 MED ORDER — PYRIDOSTIGMINE BROMIDE 60 MG PO TABS: 60.0000 mg | ORAL_TABLET | Freq: Three times a day (TID) | ORAL | Status: DC
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
## 2016-02-24 NOTE — Patient Instructions (Addendum)
Reduce prednisone down to 40mg  daily for two weeks, then reduce to by 10mg  every 2 weeks. Check vitamin B12 Return to clinic 3 months

## 2016-02-29 DIAGNOSIS — H6123 Impacted cerumen, bilateral: Secondary | ICD-10-CM | POA: Diagnosis not present

## 2016-03-01 ENCOUNTER — Ambulatory Visit (INDEPENDENT_AMBULATORY_CARE_PROVIDER_SITE_OTHER): Payer: Medicare Other | Admitting: *Deleted

## 2016-03-01 DIAGNOSIS — E538 Deficiency of other specified B group vitamins: Secondary | ICD-10-CM | POA: Diagnosis not present

## 2016-03-01 MED ORDER — CYANOCOBALAMIN 1000 MCG/ML IJ SOLN
1000.0000 ug | Freq: Once | INTRAMUSCULAR | Status: AC
  Administered 2016-03-01: 1000 ug via INTRAMUSCULAR

## 2016-03-01 NOTE — Progress Notes (Signed)
Patient in for B12 injection. 

## 2016-03-02 ENCOUNTER — Ambulatory Visit (INDEPENDENT_AMBULATORY_CARE_PROVIDER_SITE_OTHER): Payer: Medicare Other | Admitting: *Deleted

## 2016-03-02 DIAGNOSIS — E538 Deficiency of other specified B group vitamins: Secondary | ICD-10-CM

## 2016-03-02 MED ORDER — CYANOCOBALAMIN 1000 MCG/ML IJ SOLN
1000.0000 ug | Freq: Once | INTRAMUSCULAR | Status: AC
  Administered 2016-03-02: 1000 ug via INTRAMUSCULAR

## 2016-03-02 NOTE — Progress Notes (Signed)
Patient in for B12 injection. 

## 2016-03-05 ENCOUNTER — Ambulatory Visit (INDEPENDENT_AMBULATORY_CARE_PROVIDER_SITE_OTHER): Payer: Medicare Other | Admitting: *Deleted

## 2016-03-05 DIAGNOSIS — E538 Deficiency of other specified B group vitamins: Secondary | ICD-10-CM

## 2016-03-05 MED ORDER — CYANOCOBALAMIN 1000 MCG/ML IJ SOLN
1000.0000 ug | Freq: Once | INTRAMUSCULAR | Status: AC
  Administered 2016-03-05: 1000 ug via INTRAMUSCULAR

## 2016-03-05 NOTE — Progress Notes (Signed)
Patient in for B12 injection. 

## 2016-03-06 ENCOUNTER — Ambulatory Visit (INDEPENDENT_AMBULATORY_CARE_PROVIDER_SITE_OTHER): Payer: Medicare Other | Admitting: *Deleted

## 2016-03-06 DIAGNOSIS — E538 Deficiency of other specified B group vitamins: Secondary | ICD-10-CM

## 2016-03-06 MED ORDER — CYANOCOBALAMIN 1000 MCG/ML IJ SOLN
1000.0000 ug | Freq: Once | INTRAMUSCULAR | Status: AC
  Administered 2016-03-06: 1000 ug via INTRAMUSCULAR

## 2016-03-06 NOTE — Progress Notes (Signed)
Patient in for B12 injection. 

## 2016-03-07 ENCOUNTER — Ambulatory Visit (INDEPENDENT_AMBULATORY_CARE_PROVIDER_SITE_OTHER): Payer: Medicare Other | Admitting: *Deleted

## 2016-03-07 DIAGNOSIS — E538 Deficiency of other specified B group vitamins: Secondary | ICD-10-CM

## 2016-03-07 MED ORDER — CYANOCOBALAMIN 1000 MCG/ML IJ SOLN
1000.0000 ug | Freq: Once | INTRAMUSCULAR | Status: AC
  Administered 2016-03-07: 1000 ug via INTRAMUSCULAR

## 2016-03-07 NOTE — Progress Notes (Signed)
Patient in for B12 injection. 

## 2016-03-08 ENCOUNTER — Ambulatory Visit (INDEPENDENT_AMBULATORY_CARE_PROVIDER_SITE_OTHER): Payer: Medicare Other | Admitting: *Deleted

## 2016-03-08 DIAGNOSIS — E538 Deficiency of other specified B group vitamins: Secondary | ICD-10-CM

## 2016-03-08 MED ORDER — CYANOCOBALAMIN 1000 MCG/ML IJ SOLN
1000.0000 ug | Freq: Once | INTRAMUSCULAR | Status: AC
  Administered 2016-03-08: 1000 ug via INTRAMUSCULAR

## 2016-03-08 NOTE — Progress Notes (Signed)
Patient in for B12 injection. 

## 2016-03-09 ENCOUNTER — Ambulatory Visit (INDEPENDENT_AMBULATORY_CARE_PROVIDER_SITE_OTHER): Payer: Medicare Other | Admitting: *Deleted

## 2016-03-09 DIAGNOSIS — E538 Deficiency of other specified B group vitamins: Secondary | ICD-10-CM | POA: Diagnosis not present

## 2016-03-09 MED ORDER — CYANOCOBALAMIN 1000 MCG/ML IJ SOLN
1000.0000 ug | Freq: Once | INTRAMUSCULAR | Status: AC
  Administered 2016-03-09: 1000 ug via INTRAMUSCULAR

## 2016-03-09 NOTE — Progress Notes (Signed)
Patient in for B12 injection. 

## 2016-03-14 ENCOUNTER — Ambulatory Visit (INDEPENDENT_AMBULATORY_CARE_PROVIDER_SITE_OTHER): Payer: Medicare Other | Admitting: *Deleted

## 2016-03-14 DIAGNOSIS — E538 Deficiency of other specified B group vitamins: Secondary | ICD-10-CM

## 2016-03-14 MED ORDER — CYANOCOBALAMIN 1000 MCG/ML IJ SOLN
1000.0000 ug | Freq: Once | INTRAMUSCULAR | Status: AC
  Administered 2016-03-14: 1000 ug via INTRAMUSCULAR

## 2016-03-14 NOTE — Progress Notes (Signed)
Patient in for B12 injection. 

## 2016-03-22 ENCOUNTER — Ambulatory Visit (INDEPENDENT_AMBULATORY_CARE_PROVIDER_SITE_OTHER): Payer: Medicare Other | Admitting: *Deleted

## 2016-03-22 DIAGNOSIS — E538 Deficiency of other specified B group vitamins: Secondary | ICD-10-CM | POA: Diagnosis not present

## 2016-03-22 MED ORDER — CYANOCOBALAMIN 1000 MCG/ML IJ SOLN
1000.0000 ug | Freq: Once | INTRAMUSCULAR | Status: AC
  Administered 2016-03-22: 1000 ug via INTRAMUSCULAR

## 2016-03-22 NOTE — Progress Notes (Signed)
Patient in for B12 injection. 

## 2016-03-28 ENCOUNTER — Ambulatory Visit (INDEPENDENT_AMBULATORY_CARE_PROVIDER_SITE_OTHER): Payer: Medicare Other | Admitting: *Deleted

## 2016-03-28 DIAGNOSIS — E538 Deficiency of other specified B group vitamins: Secondary | ICD-10-CM | POA: Diagnosis not present

## 2016-03-28 MED ORDER — CYANOCOBALAMIN 1000 MCG/ML IJ SOLN
1000.0000 ug | Freq: Once | INTRAMUSCULAR | Status: AC
  Administered 2016-03-28: 1000 ug via INTRAMUSCULAR

## 2016-03-28 NOTE — Progress Notes (Signed)
Patient in for B12 injection. 

## 2016-05-03 ENCOUNTER — Ambulatory Visit (INDEPENDENT_AMBULATORY_CARE_PROVIDER_SITE_OTHER): Payer: Medicare Other | Admitting: *Deleted

## 2016-05-03 DIAGNOSIS — E538 Deficiency of other specified B group vitamins: Secondary | ICD-10-CM | POA: Diagnosis not present

## 2016-05-03 MED ORDER — CYANOCOBALAMIN 1000 MCG/ML IJ SOLN
1000.0000 ug | Freq: Once | INTRAMUSCULAR | Status: AC
  Administered 2016-05-03: 1000 ug via INTRAMUSCULAR

## 2016-05-31 ENCOUNTER — Ambulatory Visit: Payer: Medicare Other | Admitting: Neurology

## 2016-06-06 ENCOUNTER — Encounter: Payer: Self-pay | Admitting: Neurology

## 2016-06-06 ENCOUNTER — Ambulatory Visit (INDEPENDENT_AMBULATORY_CARE_PROVIDER_SITE_OTHER): Payer: Medicare Other | Admitting: Neurology

## 2016-06-06 VITALS — BP 150/84 | HR 71 | Ht 68.0 in | Wt 209.6 lb

## 2016-06-06 DIAGNOSIS — E538 Deficiency of other specified B group vitamins: Secondary | ICD-10-CM | POA: Diagnosis not present

## 2016-06-06 DIAGNOSIS — G7000 Myasthenia gravis without (acute) exacerbation: Secondary | ICD-10-CM | POA: Diagnosis not present

## 2016-06-06 MED ORDER — PREDNISONE 5 MG PO TABS: 5.0000 mg | ORAL_TABLET | Freq: Every day | ORAL | 3 refills | Status: DC

## 2016-06-06 MED ORDER — CYANOCOBALAMIN 1000 MCG/ML IJ SOLN
1000.0000 ug | Freq: Once | INTRAMUSCULAR | Status: AC
  Administered 2016-06-06: 1000 ug via INTRAMUSCULAR
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
## 2016-07-31 DIAGNOSIS — M21621 Bunionette of right foot: Secondary | ICD-10-CM | POA: Diagnosis not present

## 2016-07-31 DIAGNOSIS — M7751 Other enthesopathy of right foot: Secondary | ICD-10-CM | POA: Diagnosis not present

## 2016-07-31 DIAGNOSIS — M71571 Other bursitis, not elsewhere classified, right ankle and foot: Secondary | ICD-10-CM | POA: Diagnosis not present

## 2016-08-13 ENCOUNTER — Ambulatory Visit: Payer: Self-pay | Admitting: Podiatry

## 2016-09-10 ENCOUNTER — Other Ambulatory Visit: Payer: Self-pay | Admitting: *Deleted

## 2016-09-10 ENCOUNTER — Telehealth: Payer: Self-pay | Admitting: Neurology

## 2016-09-10 MED ORDER — PREDNISONE 5 MG PO TABS: 5.0000 mg | ORAL_TABLET | Freq: Every day | ORAL | 3 refills | Status: DC

## 2016-09-10 NOTE — Telephone Encounter (Signed)
Rx sent 

## 2016-09-10 NOTE — Telephone Encounter (Signed)
Marcus [name] 11/22/1931. He needs a refill on Prednisone. He has switched to [facility] on [facility]. He has had a relapse. Needing to be seen soon. He is scheduled for 09/19/16 for a follow up. He has [place] [id number]. His # [id number]. Thank you

## 2016-09-12 NOTE — Telephone Encounter (Signed)
Patient coming in on Monday

## 2016-09-17 ENCOUNTER — Encounter: Payer: Self-pay | Admitting: Neurology

## 2016-09-17 ENCOUNTER — Other Ambulatory Visit (INDEPENDENT_AMBULATORY_CARE_PROVIDER_SITE_OTHER): Payer: Medicare Other

## 2016-09-17 ENCOUNTER — Ambulatory Visit (INDEPENDENT_AMBULATORY_CARE_PROVIDER_SITE_OTHER): Payer: Medicare Other | Admitting: Neurology

## 2016-09-17 VITALS — BP 140/98 | HR 71 | Ht 68.0 in | Wt 189.6 lb

## 2016-09-17 DIAGNOSIS — E538 Deficiency of other specified B group vitamins: Secondary | ICD-10-CM

## 2016-09-17 DIAGNOSIS — Z79899 Other long term (current) drug therapy: Secondary | ICD-10-CM | POA: Diagnosis not present

## 2016-09-17 DIAGNOSIS — G7001 Myasthenia gravis with (acute) exacerbation: Secondary | ICD-10-CM

## 2016-09-17 LAB — URINALYSIS, ROUTINE W REFLEX MICROSCOPIC
Bilirubin Urine: NEGATIVE
HGB URINE DIPSTICK: NEGATIVE
Ketones, ur: NEGATIVE
LEUKOCYTES UA: NEGATIVE
NITRITE: NEGATIVE
RBC / HPF: NONE SEEN (ref 0–?)
SPECIFIC GRAVITY, URINE: 1.015 (ref 1.000–1.030)
Total Protein, Urine: NEGATIVE
URINE GLUCOSE: 250 — AB
Urobilinogen, UA: 0.2 (ref 0.0–1.0)
WBC, UA: NONE SEEN (ref 0–?)
pH: 6.5 (ref 5.0–8.0)

## 2016-09-17 LAB — CBC
HCT: 47.7 % (ref 39.0–52.0)
HEMOGLOBIN: 16.3 g/dL (ref 13.0–17.0)
MCHC: 34.1 g/dL (ref 30.0–36.0)
MCV: 94.3 fl (ref 78.0–100.0)
PLATELETS: 133 10*3/uL — AB (ref 150.0–400.0)
RBC: 5.06 Mil/uL (ref 4.22–5.81)
RDW: 14 % (ref 11.5–15.5)
WBC: 10.6 10*3/uL — ABNORMAL HIGH (ref 4.0–10.5)

## 2016-09-17 LAB — COMPREHENSIVE METABOLIC PANEL
ALBUMIN: 4.3 g/dL (ref 3.5–5.2)
ALT: 22 U/L (ref 0–53)
AST: 18 U/L (ref 0–37)
Alkaline Phosphatase: 80 U/L (ref 39–117)
BILIRUBIN TOTAL: 0.6 mg/dL (ref 0.2–1.2)
BUN: 17 mg/dL (ref 6–23)
CALCIUM: 9.4 mg/dL (ref 8.4–10.5)
CO2: 28 mEq/L (ref 19–32)
Chloride: 102 mEq/L (ref 96–112)
Creatinine, Ser: 0.87 mg/dL (ref 0.40–1.50)
GFR: 89.28 mL/min (ref >60.00)
Glucose, Bld: 99 mg/dL (ref 70–99)
Potassium: 5.6 mEq/L — ABNORMAL HIGH (ref 3.5–5.1)
Sodium: 137 mEq/L (ref 135–145)
Total Protein: 7 g/dL (ref 6.0–8.3)

## 2016-09-17 MED ORDER — CYANOCOBALAMIN 1000 MCG/ML IJ SOLN
1000.0000 ug | Freq: Once | INTRAMUSCULAR | Status: AC
  Administered 2016-09-17: 1000 ug via INTRAMUSCULAR

## 2016-09-17 MED ORDER — PREDNISONE 10 MG PO TABS: ORAL_TABLET | ORAL | 3 refills | Status: DC
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
## 2016-09-17 NOTE — Addendum Note (Signed)
Addended by: Chester Holstein on: 09/17/2016 04:16 PM   Modules accepted: Orders

## 2016-09-17 NOTE — Patient Instructions (Addendum)
1.  Increase prednisone to 60mg  daily x 2 weeks, then reduce to 40mg  x 2 weeks, then 30mg  x 2 weeks, then 20mg  daily.  2.  Continue mestinon 60mg  three times daily  3.  Check blood work  4.  Vitamin B12 injection  Return to clinic on Friday, February 2nd at 11am.  Please arrive 10 minutes prior to appointment.

## 2016-09-19 ENCOUNTER — Ambulatory Visit: Payer: Medicare Other | Admitting: Neurology

## 2016-09-27 ENCOUNTER — Telehealth: Payer: Self-pay | Admitting: *Deleted

## 2016-09-27 NOTE — Telephone Encounter (Signed)
Please see if patient is available to come into the clinic on Tuesday at 8 AM for me to evaluate his symptoms and determine if he needs IVIG. In the meantime, please follow up on whether he has prior authorized for IVIG and if it extends into 2018.  Continue prednisone 60 mg for now.  Jerelyn Trimarco K. Posey Pronto, DO

## 2016-09-27 NOTE — Telephone Encounter (Signed)
Patient is coming in on 10-02-16.  I informed him that we will be discussing IVIG.  He was wondering if he should up the dose of his prednisone but I informed him that Dr. Posey Pronto would like for him to stay on 60 mg for now.

## 2016-09-27 NOTE — Telephone Encounter (Signed)
Patient called to let us know that he is not doing any better after being on the meds for 10 days.  He has not had any trouble breathing but is worried about that happening.  Please advise?  He mentioned IVIG.

## 2016-10-02 ENCOUNTER — Ambulatory Visit (INDEPENDENT_AMBULATORY_CARE_PROVIDER_SITE_OTHER): Payer: Medicare Other | Admitting: Neurology

## 2016-10-02 ENCOUNTER — Other Ambulatory Visit (INDEPENDENT_AMBULATORY_CARE_PROVIDER_SITE_OTHER): Payer: Medicare Other

## 2016-10-02 ENCOUNTER — Encounter: Payer: Self-pay | Admitting: Neurology

## 2016-10-02 VITALS — BP 160/90 | HR 68 | Ht 68.0 in | Wt 187.2 lb

## 2016-10-02 DIAGNOSIS — G7000 Myasthenia gravis without (acute) exacerbation: Secondary | ICD-10-CM

## 2016-10-02 DIAGNOSIS — R0600 Dyspnea, unspecified: Secondary | ICD-10-CM

## 2016-10-02 LAB — CBC WITH DIFFERENTIAL/PLATELET
BASOS ABS: 0 10*3/uL (ref 0.0–0.1)
Basophils Relative: 0 % (ref 0.0–3.0)
EOS ABS: 0 10*3/uL (ref 0.0–0.7)
Eosinophils Relative: 0.1 % (ref 0.0–5.0)
HCT: 44.4 % (ref 39.0–52.0)
Hemoglobin: 15.5 g/dL (ref 13.0–17.0)
LYMPHS ABS: 0.4 10*3/uL — AB (ref 0.7–4.0)
Lymphocytes Relative: 4 % — ABNORMAL LOW (ref 12.0–46.0)
MCHC: 35 g/dL (ref 30.0–36.0)
MCV: 93.8 fl (ref 78.0–100.0)
MONO ABS: 0.3 10*3/uL (ref 0.1–1.0)
MONOS PCT: 2.9 % — AB (ref 3.0–12.0)
Neutro Abs: 9.1 10*3/uL — ABNORMAL HIGH (ref 1.4–7.7)
Neutrophils Relative %: 93 % — ABNORMAL HIGH (ref 43.0–77.0)
Platelets: 99 10*3/uL — ABNORMAL LOW (ref 150.0–400.0)
RBC: 4.74 Mil/uL (ref 4.22–5.81)
RDW: 14.1 % (ref 11.5–15.5)
WBC: 9.8 10*3/uL (ref 4.0–10.5)

## 2016-10-02 LAB — VITAMIN D 25 HYDROXY (VIT D DEFICIENCY, FRACTURES): VITD: 30.29 ng/mL (ref 30.00–100.00)

## 2016-10-02 LAB — MAGNESIUM: MAGNESIUM: 2.2 mg/dL (ref 1.5–2.5)
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
## 2016-10-02 NOTE — Patient Instructions (Signed)
1.  Check labs 2.  Follow-up with Dr. Melvyn Novas to exclude other cause for your shortness of breath 3.  Continue prednisone 40mg  daily until further instruction 4.  Call with update in 2 weeks and we will decide how to adjust your prednisone 5.  Continue monthly vitamin B12 injections

## 2016-10-03 ENCOUNTER — Other Ambulatory Visit (INDEPENDENT_AMBULATORY_CARE_PROVIDER_SITE_OTHER): Payer: Medicare Other

## 2016-10-03 ENCOUNTER — Encounter: Payer: Self-pay | Admitting: Internal Medicine

## 2016-10-03 ENCOUNTER — Ambulatory Visit (INDEPENDENT_AMBULATORY_CARE_PROVIDER_SITE_OTHER)
Admission: RE | Admit: 2016-10-03 | Discharge: 2016-10-03 | Disposition: A | Discharge status: Home / Self Care | Payer: Medicare Other | Source: Ambulatory Visit | Attending: Internal Medicine | Admitting: Internal Medicine

## 2016-10-03 ENCOUNTER — Ambulatory Visit (INDEPENDENT_AMBULATORY_CARE_PROVIDER_SITE_OTHER): Payer: Medicare Other | Admitting: Internal Medicine

## 2016-10-03 VITALS — BP 144/86 | HR 77 | Ht 68.0 in | Wt 185.2 lb

## 2016-10-03 DIAGNOSIS — D696 Thrombocytopenia, unspecified: Secondary | ICD-10-CM

## 2016-10-03 DIAGNOSIS — G7000 Myasthenia gravis without (acute) exacerbation: Secondary | ICD-10-CM

## 2016-10-03 DIAGNOSIS — J45909 Unspecified asthma, uncomplicated: Secondary | ICD-10-CM

## 2016-10-03 DIAGNOSIS — R0609 Other forms of dyspnea: Secondary | ICD-10-CM | POA: Diagnosis not present

## 2016-10-03 DIAGNOSIS — R0602 Shortness of breath: Secondary | ICD-10-CM | POA: Diagnosis not present

## 2016-10-03 LAB — BASIC METABOLIC PANEL
BUN: 14 mg/dL (ref 6–23)
CO2: 27 mEq/L (ref 19–32)
Calcium: 9 mg/dL (ref 8.4–10.5)
Chloride: 103 mEq/L (ref 96–112)
Creatinine, Ser: 0.9 mg/dL (ref 0.40–1.50)
GFR: 85.85 mL/min (ref >60.00)
GLUCOSE: 142 mg/dL — AB (ref 70–99)
POTASSIUM: 4.8 meq/L (ref 3.5–5.1)
SODIUM: 140 meq/L (ref 135–145)

## 2016-10-03 LAB — BRAIN NATRIURETIC PEPTIDE: Pro B Natriuretic peptide (BNP): 318 pg/mL — ABNORMAL HIGH (ref 0.0–100.0)

## 2016-10-03 LAB — D-DIMER, QUANTITATIVE (NOT AT ARMC): D DIMER QUANT: 0.53 ug{FEU}/mL — AB (ref <0.50)

## 2016-10-03 LAB — TSH: TSH: 1.28 u[IU]/mL (ref 0.35–4.50)

## 2016-10-03 MED ORDER — BUDESONIDE-FORMOTEROL FUMARATE 80-4.5 MCG/ACT IN AERO: 2.0000 | INHALATION_SPRAY | Freq: Two times a day (BID) | RESPIRATORY_TRACT | 0 refills | Status: DC

## 2016-10-03 NOTE — Progress Notes (Signed)
Spoke with pt and notified of results per Dr. Wert. Pt verbalized understanding and denied any questions. 

## 2016-10-03 NOTE — Progress Notes (Signed)
Subjective:     Patient ID: Marcus [name], male   DOB: 1932-03-10     MRN: [id number]    Brief patient profile:  82 yowm   MG on daily prednisone since 2013 quit smoking around 1976 with bad cough p got off jet around 1986 then same happened 2 days after got off jet coming back from [place] onset of diarrhea /cough 7/9 sarted zpak 04/11/15  And referred to pulmonary clinic 04/13/2015 for refractory cough with CAP on cxr.    History of Present Illness  04/13/2015 1st [facility] office visit/ Aldon Hengst   Chief Complaint  Patient presents with  . Pulmonary Consult    sob, self referral, tested for Whooping Cough by PCP, Returning from [place] 16 hr flight,MG x 3 years  followed by Dr Posey Pronto neurology  for [facility] and feels this is stable but acutely ill as above 2 d p jet travel initially with diarrhea resolved then  severe cough no vomiting > mucus is yellow and thick and hard to cough up, can no longer lie down in bed and sleep without cough and gasping for air. No better since zpak on 7/11 but no def fever at any pont.  Dx CAP  rec Admit [facility] > all better   Admit date: 04/13/2015 Discharge date: 04/17/2015   Recommendations for Outpatient Follow-Up:   1. F/U with PCP in 1 week to ensure resolution of symptoms. Consider repeat CXR in 3-4 weeks.   Discharge Diagnosis:   Principal Problem:   Sepsis secondary to CAP Active Problems:   CAP (community acquired pneumonia)   Myasthenia gravis   Hypothyroidism   Influenza A   Acute on chronic respiratory failure   Acute renal failure   Paroxysmal a-fib        12/15/2015 acute extended ov/Mansour Balboa re: cough /sob on prednisone 20 x one week  prior to [place] Complaint  Patient presents with  . Acute Visit    Pt c/o sob and cough x 3 days. Cough is prod with white sputum.   acute onset s travel this episode, comfortable at rest though more difficult in supine position due to sob/cough  last night first time this  happened since dx of CAP but comfortable at rest sitting. rec dulera 100 Take 2 puffs first thing in am and then another 2 puffs about 12 hours later.  For cough / congestion mucinex dm 1200 mg every 12 hours Levaquin 500mg  daily x 7 days   01/03/2016  f/u ov/Twilla Khouri re: s/p acute ab/ off dulera x one week s flare  Chief Complaint  Patient presents with  . Follow-up    Breathing has improved back to his normal baseline. No new co's today.   Not limited by breathing from desired activities   rec Keep the dulera 100 sample on hand for recurrent cough or wheeze or short of breath and call me if start needing it for follow up appt Work on maintaining perfect inhaler technique:  relax and gently blow all the way out then take a nice smooth deep breath back in, triggering the inhaler at same time you start breathing in.  Hold for up to 5 seconds if you can. Blow out thru nose. Rinse and gargle with water when done Keep track of your inspiratory capacity on your incentive spirometer so you can communicate with your neurologist if you start noting a decline which would likely be related to MG     10/03/2016 acute extended ov/Anneka Studer  re: sob "I think it's my MG/ Dr Posey Pronto does not Chief Complaint  Patient presents with  . Acute Visit    Pt c/o increased DOE for the past 3 wks. He states that he is getting winded and tired just walking from one room to the next. He also has been hoarse for the past few wks, but no coughing.   did not start the dulera/ not following  IC at home as recommended  Acute onset "like overnight" sob developed x 3 weeks   s cough but legs and arms weaker than usual Prior to onset was able to sleep almost flat  and since then 30 degrees s am exac of cough/ congestion/ dysphagia / choking No worse late in day vs early  In day      No obvious day to day or daytime variability or assoc excess/ purulent sputum or mucus plugs   cp or chest tightness, subjective wheeze or overt sinus or  hb symptoms. No unusual exp hx or h/o childhood pna/ asthma or knowledge of premature birth.  Sleeping ok without nocturnal  or early am exacerbation  of respiratory  c/o's or need for noct saba. Also denies any obvious fluctuation of symptoms with weather or environmental changes or other aggravating or alleviating factors except as outlined above   Current Medications, Allergies, Complete Past Medical History, Past Surgical History, Family History, and Social History were reviewed in [employer] record.  ROS  The following are not active complaints unless bolded sore throat, dysphagia, dental problems, itching, sneezing,  nasal congestion or excess/ purulent secretions, ear ache,   fever, chills, sweats, unintended wt loss, classically pleuritic or exertional cp, hemoptysis,  orthopnea pnd or leg swelling, presyncope, palpitations, abdominal pain, anorexia, nausea, vomiting, diarrhea  or change in bowel or bladder habits, change in stools or urine, dysuria,hematuria,  rash, arthralgias, visual complaints, headache numbness, weakness or ataxia or problems with walking or coordination,  change in mood/affect or memory.              Objective:   Physical Exam    obese amb wm     10/03/2016          185  01/03/2016          209   12/15/2015       206   04/13/15 210 lb 6.4 oz (95.437 kg)  06/13/12 205 lb 7.5 oz (93.2 kg)  06/11/12 202 lb (91.627 kg)    Vital signs reviewed - Note on arrival 02 sats  97% on RA    HEENT: nl dentition, turbinates, and orophanx. Nl external ear canals without cough reflex   NECK :  without JVD/Nodes/TM/ nl carotid upstrokes bilaterally   LUNGS: no acc muscle use, completely clear to A and P    CV:  RRR  no s3 or murmur or increase in P2, no edema   ABD:  Obese  soft and nontender with nl excursion in the supine position. No bruits or organomegaly, bowel sounds nl  MS:  warm without deformities, calf tenderness, cyanosis or  clubbing  SKIN: warm and dry without lesions    NEURO:  alert, approp, no deficits     CXR PA and Lateral:   10/03/2016 :    I personally reviewed images and agree with radiology impression as follows:    1. Pulmonary nodules noted of the anterior lungs on lateral view, this is most likely in the midportion of the medial aspect  of the right lung as noted on PET-CT of 07/10/2013. This may be slightly more prominent than on prior PET-CT, and a follow-up PET CT can be obtained to further evaluate .  2. Left base pleural-parenchymal thickening consistent with scarring.  3.  Mild cardiomegaly.  No evidence of pulmonary venous congestion .       Labs ordered/ reviewed:      Chemistry      Component Value Date/Time   NA 140 10/03/2016 1204   K 4.8 10/03/2016 1204   CL 103 10/03/2016 1204   CO2 27 10/03/2016 1204   BUN 14 10/03/2016 1204   CREATININE 0.90 10/03/2016 1204      Component Value Date/Time   CALCIUM 9.0 10/03/2016 1204   ALKPHOS 80 09/17/2016 1540   AST 18 09/17/2016 1540   ALT 22 09/17/2016 1540   BILITOT 0.6 09/17/2016 1540        Lab Results  Component Value Date   WBC 9.8 10/02/2016   HGB 15.5 10/02/2016   HCT 44.4 10/02/2016   MCV 93.8 10/02/2016   PLT 99.0 (L) 10/02/2016     Lab Results  Component Value Date   DDIMER 0.53 (H) 10/03/2016      Lab Results  Component Value Date   TSH 1.28 10/03/2016     Lab Results  Component Value Date   PROBNP 318.0 (H) 10/03/2016             Assessment:

## 2016-10-03 NOTE — Patient Instructions (Addendum)
Symbicort 80 Take 2 puffs first thing in am and then another 2 puffs about 12 hours later only use it if you feel it helps  Incentive spirometry should be checked twice daily to track your inspiratory muscle strength    Please remember to go to the lab and x-ray department downstairs for your tests - we will call you with the results when they are available.  If condition is worsening and can't get breath at rest, go to ER   Please schedule a follow up office visit in 4 weeks, sooner if needed with pfts at [facility] fro MIF/ MEF  Add:  Needs repeat cbc next ov no asa

## 2016-10-04 ENCOUNTER — Telehealth: Payer: Self-pay | Admitting: Internal Medicine

## 2016-10-04 NOTE — Telephone Encounter (Signed)
lmtcb for pt.  

## 2016-10-04 NOTE — Progress Notes (Signed)
Spoke with pt and notified of results per Dr. Wert. Pt verbalized understanding and denied any questions. 

## 2016-10-05 ENCOUNTER — Telehealth: Payer: Self-pay | Admitting: Neurology

## 2016-10-05 NOTE — Telephone Encounter (Signed)
Spoke with patient about his lab results and informed him that these are labs that we normally do not order but explained to him what they are for.

## 2016-10-05 NOTE — Telephone Encounter (Signed)
Marcus [name] 10/19/1931. He would like you to call him please [phone]. He has some questions for you. Thank you

## 2016-10-05 NOTE — Telephone Encounter (Signed)
Called and spoke to pt. Pt questioning how long the Symbicort sample will last. Advised pt that typically the samples last 2 weeks. Pt verbalized understanding and denied any further questions or concerns at this time.

## 2016-10-06 LAB — VITAMIN B1: Vitamin B1 (Thiamine): 12 nmol/L (ref 8–30)

## 2016-10-07 DIAGNOSIS — D696 Thrombocytopenia, unspecified: Secondary | ICD-10-CM | POA: Insufficient documentation

## 2016-10-07 NOTE — Assessment & Plan Note (Signed)
Spirometry 01/03/2016  Nl off all inhalers    - Spirometry 10/03/2016 not physiologic  - 10/03/2016  After extensive coaching HFA effectiveness =    75% > try symbicort 80 2bid   I strongly doubt this is asthma but until can return for more formal pfts ok to try a sample

## 2016-10-07 NOTE — Assessment & Plan Note (Signed)
Spirometry 01/03/2016   VC = 3.91 abd repeat 10/03/2016  5.13   He may be having muscle weakness but it does not appear to be affecting his insp / exp muscles > will send for full pfts and MIF/MEF

## 2016-10-07 NOTE — Assessment & Plan Note (Addendum)
Lab Results  Component Value Date   PLT 99.0 (L) 10/02/2016   PLT 133.0 (L) 09/17/2016   PLT 187 04/15/2015     May be trending down x 6 months s obvious cause , needs repeat and consider heme/onc eval - in meantime no asa

## 2016-10-07 NOTE — Assessment & Plan Note (Signed)
-   spirometry 01/03/2016   FVC  3.91  10/03/2016  Walked RA x 3 laps @ 185 ft each stopped due to  End of study, nl pace, no sob or desat   Min fatigue - Spirometry 10/03/2016  FVC  5.13  But f/v loop not physiologic   Symptoms are markedly disproportionate to objective findings and not clear this is a lung problem but pt does appear to have difficult airway management issues. DDX of  difficult airways management almost all start with A and  include Adherence, Ace Inhibitors, Acid Reflux, Active Sinus Disease, Alpha 1 Antitripsin deficiency, Anxiety masquerading as Airways dz,  ABPA,  Allergy(esp in young), Aspiration (esp in elderly), Adverse effects of meds,  Active smokers, A bunch of PE's (a small clot burden can't cause this syndrome unless there is already severe underlying pulm or vascular dz with poor reserve) plus two Bs  = Bronchiectasis and Beta blocker use..and one C= CHF  Adherence is always the initial "prime suspect" and is a multilayered concern that requires a "trust but verify" approach in every patient - starting with knowing how to use medications, especially inhalers, correctly, keeping up with refills and understanding the fundamental difference between maintenance and prns vs those medications only taken for a very short course and then stopped and not refilled.   ? Allergy/ asthma > doubt but ok to try symbicort 80 2bid   ? Acid (or non-acid) GERD > always difficult to exclude as up to 75% of pts in some series report no assoc GI/ Heartburn symptoms> rec max (24h)  acid suppression should be added next esp in view of noct symptoms   ? Anxiety > usually at the bottom of this list of usual suspects but should be   higher on this pt's based on H and P   ? A bunch of PE's > D dimer nl - while  A nl valute  may miss small peripheral pe, the clot burden with sob is moderately high and the d dimer has a very high neg pred value in this setting     ? chf > suggested by orthopnea and  intermediate BNP > needs echo   I had an extended discussion with the patient reviewing all relevant studies completed to date and  lasting 25 minutes of a 40  minute acute  visit   re new  non-specific but potentially very serious pulmonary symptoms of unknown etiology.  Each maintenance medication was reviewed in detail including most importantly the difference between maintenance and prns and under what circumstances the prns are to be triggered using an action plan format that is not reflected in the computer generated alphabetically organized AVS.    Please see AVS for specific instructions unique to this office visit that I personally wrote and verbalized to the the pt in detail and then reviewed with pt  by my nurse highlighting any  changes in therapy recommended at today's visit to their plan of care.

## 2016-10-08 ENCOUNTER — Encounter: Payer: Self-pay | Admitting: Internal Medicine

## 2016-10-08 ENCOUNTER — Encounter: Payer: Self-pay | Admitting: Neurology

## 2016-10-08 ENCOUNTER — Telehealth: Payer: Self-pay | Admitting: *Deleted

## 2016-10-08 DIAGNOSIS — R0602 Shortness of breath: Secondary | ICD-10-CM

## 2016-10-08 MED ORDER — PREDNISONE 10 MG PO TABS
ORAL_TABLET | ORAL | 3 refills | Status: DC
Start: 2016-10-08 — End: 2016-11-02

## 2016-10-08 NOTE — Telephone Encounter (Signed)
-----   Message from Alda Berthold, DO sent at 10/08/2016  8:18 AM EST ----- Please also tell him that if he is doing better, then to reduce prednisone to 20mg  daily next week.

## 2016-10-08 NOTE — Telephone Encounter (Signed)
Spoke with pt, and made him aware of MW recommendations. Pt agreed to go ahead and order the echo. Pt states he would like MW to know that he has been on a plaint based diet X91mo. Pt states he hasn't been eating any dairy, meat or oils. Pt states he hasn't had any stomach discomfort since bein gon this diet. Pt asked that I make MW aware of this diet, before sending in any Rx.  MW please advise. Thanks.   Symbicort 80 Take 2 puffs first thing in am and then another 2 puffs about 12 hours later only use it if you feel it helps  Incentive spirometry should be checked twice daily to track your inspiratory muscle strength    Please remember to go to the lab and x-ray department downstairs for your tests - we will call you with the results when they are available.  If condition is worsening and can't get breath at rest, go to ER   Please schedule a follow up office visit in 4 weeks, sooner if needed with pfts at [facility] fro MIF/ MEF  Add:  Needs repeat cbc next ov no asa

## 2016-10-08 NOTE — Telephone Encounter (Signed)
MW  Please Advise- Please see pt. email below  Marcus [name]  to Tanda Rockers, MD       10/08/16 1:29 PM  I will of course follow your direction..I assume that this pepcid is over the counter.   Marcus [name]  to Tanda Rockers, MD       10/08/16 1:25 PM  ..I have not had any sign of acid reflux in more than five years.Marland KitchenMarland KitchenI do not consume any of the top ten acid causing foods or beverages including alcohol or coffee.I am installing blocks under my head board today that will raise my sleep level just as a matter of knowing people who do have reflux at night do this to help.  Please explain how I could be suffering from too much acid without my being aware of it.I am happy to come in for an appointment and would like very much to be able to then discuss it with my primary care doctor  [name]. Last thought: is there no test to check acid production??

## 2016-10-08 NOTE — Telephone Encounter (Signed)
Aware, no change rx

## 2016-10-08 NOTE — Telephone Encounter (Signed)
Called and spoke to pt. Informed him of the recs per MW. Pt verbalized understanding and states he sent over an email regarding his diet in more detail and states he does not have reflux and would like MW to review his email prior to taking the medications. Will sign off.   Will send to MW as Juluis Rainier.

## 2016-10-08 NOTE — Telephone Encounter (Signed)
-----   Message from Tanda Rockers, MD sent at 10/07/2016  6:30 AM EST ----- After further chart review also needs  Echo dx sob  Pantoprazole (protonix) 40 mg   Take  30-60 min before first meal of the day and Pepcid (famotidine)  20 mg one @  bedtime until return to office - this is the best way to tell whether stomach acid is contributing to your problem.

## 2016-10-08 NOTE — Telephone Encounter (Signed)
Returned call to patient and discussed results of labs which shows low platelets.  He will be seeing his PCP about this - results have been faxed.  I also addressed his MyChart questions and informed him to continue to follow recommendations by Dr. Melvyn Novas as acid reflux does not always present with typical symptoms.    He is feeling a little better, generalized weakness has improved, but he still has shortness of breath on exertion.  No other neck heaviness, double vision.  He was instructed to taper his prednisone to 20mg  next week.  Tommie Bohlken K. Posey Pronto, DO

## 2016-10-08 NOTE — Telephone Encounter (Signed)
MW please advise.  Thanks.  

## 2016-10-08 NOTE — Telephone Encounter (Signed)
LMTCB

## 2016-10-09 ENCOUNTER — Telehealth: Payer: Self-pay | Admitting: *Deleted

## 2016-10-09 NOTE — Telephone Encounter (Signed)
Patient notified by Dr. Posey Pronto.  Will fax results.

## 2016-10-09 NOTE — Telephone Encounter (Signed)
-----   Message from Alda Berthold, DO sent at 10/08/2016  3:00 PM EST ----- Pt notified.  Please sent results to PCP.

## 2016-10-09 NOTE — Telephone Encounter (Signed)
Patient notified by Dr. Posey Pronto.

## 2016-10-09 NOTE — Telephone Encounter (Signed)
Patient notified by Dr. Posey Pronto.  Will fax results to PCP.

## 2016-10-09 NOTE — Telephone Encounter (Signed)
-----   Message from Alda Berthold, DO sent at 10/08/2016  8:00 AM EST ----- Please inform patient that his platelet count has been trending on the low side and I would like to share these results with his PCP to see if there is any reason for this.  This would not have any effects on his myasthenia.  Remaining labs looked well.

## 2016-10-09 NOTE — Telephone Encounter (Signed)
-----   Message from Alda Berthold, DO sent at 10/08/2016  8:18 AM EST ----- Please also tell him that if he is doing better, then to reduce prednisone to 20mg  daily next week.

## 2016-10-10 ENCOUNTER — Encounter: Payer: Self-pay | Admitting: Neurology

## 2016-10-12 ENCOUNTER — Encounter: Payer: Self-pay | Admitting: Neurology

## 2016-10-12 DIAGNOSIS — D696 Thrombocytopenia, unspecified: Secondary | ICD-10-CM | POA: Diagnosis not present

## 2016-10-12 DIAGNOSIS — N179 Acute kidney failure, unspecified: Secondary | ICD-10-CM | POA: Diagnosis not present

## 2016-10-12 DIAGNOSIS — G7000 Myasthenia gravis without (acute) exacerbation: Secondary | ICD-10-CM | POA: Diagnosis not present

## 2016-10-12 DIAGNOSIS — I10 Essential (primary) hypertension: Secondary | ICD-10-CM | POA: Diagnosis not present

## 2016-10-12 DIAGNOSIS — E785 Hyperlipidemia, unspecified: Secondary | ICD-10-CM | POA: Diagnosis not present

## 2016-10-22 ENCOUNTER — Ambulatory Visit (HOSPITAL_COMMUNITY): Payer: Medicare Other | Attending: Cardiovascular Disease

## 2016-10-22 ENCOUNTER — Other Ambulatory Visit: Payer: Self-pay

## 2016-10-22 DIAGNOSIS — R9439 Abnormal result of other cardiovascular function study: Secondary | ICD-10-CM | POA: Diagnosis not present

## 2016-10-22 DIAGNOSIS — I340 Nonrheumatic mitral (valve) insufficiency: Secondary | ICD-10-CM | POA: Diagnosis not present

## 2016-10-22 DIAGNOSIS — R0602 Shortness of breath: Secondary | ICD-10-CM | POA: Diagnosis not present

## 2016-10-22 DIAGNOSIS — I517 Cardiomegaly: Secondary | ICD-10-CM | POA: Diagnosis not present

## 2016-10-22 DIAGNOSIS — I501 Left ventricular failure: Secondary | ICD-10-CM | POA: Insufficient documentation

## 2016-10-22 DIAGNOSIS — I351 Nonrheumatic aortic (valve) insufficiency: Secondary | ICD-10-CM | POA: Insufficient documentation

## 2016-10-23 ENCOUNTER — Telehealth: Payer: Self-pay | Admitting: Internal Medicine

## 2016-10-23 NOTE — Progress Notes (Signed)
Spoke with pt and notified of results per Dr. Wert. Pt verbalized understanding and denied any questions. 

## 2016-10-23 NOTE — Telephone Encounter (Signed)
I spoke with the pt and notified of results of ECHO per MW  MW rec that he schedule ov to discuss  Pt states prefers to see hematology first, and then will call to schedule ov

## 2016-10-25 ENCOUNTER — Encounter: Payer: Self-pay | Admitting: Neurology

## 2016-10-29 ENCOUNTER — Telehealth: Payer: Self-pay | Admitting: Neurology

## 2016-10-29 NOTE — Telephone Encounter (Signed)
Jese Venturino 07/23/1932. He had missed a call on Friday from our office and returned it but we had already left for the day. He said you can call back if needed. Thank you

## 2016-10-31 ENCOUNTER — Ambulatory Visit: Payer: Medicare Other

## 2016-10-31 ENCOUNTER — Other Ambulatory Visit (HOSPITAL_BASED_OUTPATIENT_CLINIC_OR_DEPARTMENT_OTHER): Payer: Medicare Other

## 2016-10-31 DIAGNOSIS — D696 Thrombocytopenia, unspecified: Secondary | ICD-10-CM

## 2016-10-31 LAB — CBC WITH DIFFERENTIAL (CANCER CENTER ONLY)
BASO#: 0 10*3/uL (ref 0.0–0.2)
BASO%: 0.1 % (ref 0.0–2.0)
EOS%: 0 % (ref 0.0–7.0)
Eosinophils Absolute: 0 10*3/uL (ref 0.0–0.5)
HCT: 42.2 % (ref 38.7–49.9)
HEMOGLOBIN: 14.5 g/dL (ref 13.0–17.1)
LYMPH#: 0.4 10*3/uL — AB (ref 0.9–3.3)
LYMPH%: 5.1 % — ABNORMAL LOW (ref 14.0–48.0)
MCH: 32.9 pg (ref 28.0–33.4)
MCHC: 34.4 g/dL (ref 32.0–35.9)
MCV: 96 fL (ref 82–98)
MONO#: 0.3 10*3/uL (ref 0.1–0.9)
MONO%: 4.3 % (ref 0.0–13.0)
NEUT%: 90.5 % — AB (ref 40.0–80.0)
NEUTROS ABS: 6.7 10*3/uL — AB (ref 1.5–6.5)
Platelets: 105 10*3/uL — ABNORMAL LOW (ref 145–400)
RBC: 4.41 10*6/uL (ref 4.20–5.70)
RDW: 14.9 % (ref 11.1–15.7)
WBC: 7.4 10*3/uL (ref 4.0–10.0)

## 2016-10-31 LAB — TECHNOLOGIST REVIEW CHCC SATELLITE: Tech Review: 2

## 2016-10-31 LAB — CHCC SATELLITE - SMEAR

## 2016-11-01 ENCOUNTER — Ambulatory Visit (HOSPITAL_BASED_OUTPATIENT_CLINIC_OR_DEPARTMENT_OTHER): Payer: Medicare Other | Admitting: Hematology & Oncology

## 2016-11-01 VITALS — BP 179/77 | HR 68 | Temp 98.0°F | Wt 176.2 lb

## 2016-11-01 DIAGNOSIS — D696 Thrombocytopenia, unspecified: Secondary | ICD-10-CM

## 2016-11-01 DIAGNOSIS — E039 Hypothyroidism, unspecified: Secondary | ICD-10-CM | POA: Diagnosis not present

## 2016-11-01 DIAGNOSIS — D510 Vitamin B12 deficiency anemia due to intrinsic factor deficiency: Secondary | ICD-10-CM | POA: Diagnosis not present

## 2016-11-01 NOTE — Progress Notes (Signed)
Referral MD  Reason for Referral: Thrombocytopenia-likely autoimmune; myasthenia gravis, pernicious anemia, and hypothyroidism   No chief complaint on file. : My platelets are getting lower.  HPI: Marcus [name] is a very nice 83 year old white male. He actually looks younger than that. He was diagnosed with myasthenia gravis I think a couple years ago. He had been on some therapy with prednisone and Mestinon. He then went on a vegetable-type diet. He was taken off his medications. It sounds like his myasthenia got worse and he was put back on prednisone and Mestinon.  He's been noted to have some progression of thrombocytopenia.  Of note, he has pernicious anemia. Back in May, his vitamin B-12 level was 185. He has been on monthly vitamin B-12 injections.  He apparently has hypothyroidism. He is on Synthroid.  It seems like his platelets started to go down in December. Back in July 2016, splenic was 187,000. In December 2017, his platelet count was 133,000. He was not anemic. His white cell count was 10.6.  In early January 2018, his platelet count was 99,000.  He was kindly referred to the [facility] for an evaluation.  He's had no bruising. He's had no bleeding. He has lost some weight because of this diet. He has had no change in bowel or bladder habits. He has had no cough. He's had no rashes.  I used to take care of his sister who had myelodysplasia. She actually transformed into acute myeloid leukemia and passed away in I think 01/14/11 from pneumonia.  He used to smoke. He has not smoked for about 30 years.  He used to drink. I think he may of had quite a bit of alcohol. He now has very little.  He has had very little in the way of surgery. I think he had his left shoulder operated on.  He has had no real changes in medication.  There is been no problems with diabetes, hypertension, hyperlipidemia, etc.  There is been no fever. He's had no recent  infections.  He is try to stay active.  Overall, his performance status is ECOG 1.   Past Medical History:  Diagnosis Date  . Acute on chronic respiratory failure ([place]) 04/15/2015  . CAP (community acquired pneumonia) 04/13/2015   See cxr 04/13/2015 > admit [facility]    . H/O hiatal hernia   . Hypothyroidism 04/13/2015  . Myasthenia gravis ([place]) 04/13/2015  . Obesity 04/13/2015  . Paroxysmal a-fib ([place]) 04/16/2015  . Sepsis ([place]) 04/15/2015  :  Past Surgical History:  Procedure Laterality Date  . AMPUTATION  06/11/2012   Procedure: AMPUTATION DIGIT;  Surgeon: [name], MD;  Location: [facility];  Service: Orthopedics;  Laterality: Left;  revision of amputation  . HERNIA REPAIR  1994  . ORIF SHOULDER FRACTURE  06/13/2012   Procedure: OPEN REDUCTION INTERNAL FIXATION (ORIF) SHOULDER FRACTURE;  Surgeon: [name], MD;  Location: [facility];  Service: Orthopedics;  Laterality: Left;  LEFT SHOULDER OPEN GREATER TUBEROSITY ORIF  . SHOULDER CLOSED REDUCTION  06/11/2012   Procedure: CLOSED MANIPULATION SHOULDER;  Surgeon: [name], MD;  Location: [facility];  Service: Orthopedics;  Laterality: Left;  :   Current Outpatient Prescriptions:  .  budesonide-formoterol (SYMBICORT) 80-4.5 MCG/ACT inhaler, Inhale 2 puffs into the lungs 2 (two) times daily., Disp: 1 Inhaler, Rfl: 0 .  levothyroxine (SYNTHROID) 75 MCG tablet, Take 75 mcg by mouth daily before breakfast. , Disp: , Rfl:  .  predniSONE (DELTASONE) 10  MG tablet, Take 2 tablets x 2 weeks, then reduce to 1 tablet daily., Disp: 60 tablet, Rfl: 3 .  pyridostigmine (MESTINON) 60 MG tablet, Take 1 tablet (60 mg total) by mouth 3 (three) times daily., Disp: 270 tablet, Rfl: 3:  :  Allergies  Allergen Reactions  . Statins Hives  :  Family History  Problem Relation Age of Onset  . Other Mother     Deceased, 38  . Heart attack Father     Deceased, 46  . Healthy Sister   . Healthy Daughter     x 4  . Healthy Son     x 3  :  Social History    Social History  . Marital status: Single    Spouse name: N/A  . Number of children: N/A  . Years of education: N/A   Occupational History  . Not on file.   Social History Main Topics  . Smoking status: Former Smoker    Packs/day: 1.00    Years: 30.00    Types: Cigarettes    Quit date: 01/10/1982  . Smokeless tobacco: Never Used  . Alcohol use 1.2 oz/week    2 Glasses of wine per week     Comment: 3-4 per week   . Drug use: No  . Sexual activity: No   Other Topics Concern  . Not on file   Social History Narrative   Lives alone in a two story home.  Divorced.  Has 7 children.     Retired from [employer].     Education: some college.  :  Pertinent items are noted in HPI.  Exam: @IPVITALS @ Well-developed and well-nourished white male in no obvious distress. Vital signs show a temperature of 98. Pulse 60. Blood pressure 179/77. Weight is 176 pounds. And neck exam shows no ocular or oral lesions. There are no palpable cervical or supraclavicular lymph nodes. Lungs are clear bilaterally. Cardiac exam regular rate and rhythm with no murmurs, rubs or bruits. Abdomen is soft. He has good bowel sounds. There is no fluid wave. There is no palpable abdominal mass. There is no palpable liver or spleen tip. Back exam shows no tenderness over the spine, ribs or hips. Extremities shows no clubbing, cyanosis or edema. He has good range of motion of his joints. Skin exam shows no rashes, ecchymoses or petechia. He has numerous seborrheic keratoses. He has some hyperpigmented lesions. I do not see anything that looks suspicious. Neurological exam shows no focal neurological deficits.    Recent Labs  10/31/16 1339  WBC 7.4  HGB 14.5  HCT 42.2  PLT 105 Platelet count consistent in citrate*   No results for input(s): NA, K, CL, CO2, GLUCOSE, BUN, CREATININE, CALCIUM in the last 72 hours.  Blood smear review:  Normochromic and normocytic population of red blood cells. There are  no nucleated red blood cells. There are no teardrop cells. I see no rouleau formation. There are no inclusion bodies. White blood cells per normal in morphology and maturation. There are no hypersegmented polys. There is no immature myeloid or lymphoid forms. Platelets are slightly decreased in number. Planets are well C[place] related. There are a few large platelets.   Pathology: None    Assessment and Plan:  Marcus [name] is an 83 year old white male. He has thrombocytopenia. 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  I see nothing on his peripheral blood smear that suggests that there  is a underlying bone marrow disorder. I don't suspect myelodysplasia. I don't suspect any kind of myeloproliferative process. I do not think there is any infiltrative process. He is taking vitamin B-12 injections.  There is no risk factor for hepatitis or HIV.  I think we can follow this along. I do not see any indication that he needs therapy.  I spent about 45 minutes with he and his wife. They're both very nice. I reviewed the labs. I explained the diagnosis of ITP and why I thought he had it.  I would like to see him back in 6 weeks. If all of skin 6 weeks, then we can start moving his appointments out a little bit longer.  I gave him a prayer blanket. He is very thankful for the prayer blanket.

## 2016-11-02 ENCOUNTER — Other Ambulatory Visit: Payer: Medicare Other

## 2016-11-02 ENCOUNTER — Encounter: Payer: Self-pay | Admitting: Neurology

## 2016-11-02 ENCOUNTER — Ambulatory Visit (INDEPENDENT_AMBULATORY_CARE_PROVIDER_SITE_OTHER): Payer: Medicare Other | Admitting: Neurology

## 2016-11-02 VITALS — BP 110/78 | HR 70 | Ht 68.0 in | Wt 180.4 lb

## 2016-11-02 DIAGNOSIS — G7000 Myasthenia gravis without (acute) exacerbation: Secondary | ICD-10-CM | POA: Diagnosis not present

## 2016-11-02 DIAGNOSIS — E538 Deficiency of other specified B group vitamins: Secondary | ICD-10-CM

## 2016-11-02 DIAGNOSIS — R278 Other lack of coordination: Secondary | ICD-10-CM

## 2016-11-02 DIAGNOSIS — M6281 Muscle weakness (generalized): Secondary | ICD-10-CM | POA: Diagnosis not present

## 2016-11-02 MED ORDER — PREDNISONE 10 MG PO TABS: ORAL_TABLET | ORAL | 3 refills | Status: DC

## 2016-11-02 MED ORDER — CYANOCOBALAMIN 1000 MCG/ML IJ SOLN
1000.0000 ug | Freq: Once | INTRAMUSCULAR | Status: AC
  Administered 2016-11-02: 1000 ug via INTRAMUSCULAR
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
## 2016-11-02 NOTE — Patient Instructions (Addendum)
1.  Reduce prednisone to 10mg  daily  2.  Continue mestinon 60mg  three times daily 3.  Continue vitamin B12 1051mcg weekly 4.  Check vitamin B6 levels 5.  Start a gentle exercise program with walking or using a stationary bike for 15 min/day 3 times per week and slowly working up on the duration an frequency  Return to clinic on April 6th at 11am.  Please arrive 10 minutes prior to appointment.

## 2016-11-05 ENCOUNTER — Telehealth: Payer: Self-pay | Admitting: *Deleted

## 2016-11-05 ENCOUNTER — Encounter: Payer: Self-pay | Admitting: *Deleted

## 2016-11-05 LAB — VITAMIN B6: VITAMIN B6: 50 ng/mL — AB (ref 2.1–21.7)

## 2016-11-05 NOTE — Telephone Encounter (Signed)
Patient notified via My Chart

## 2016-11-05 NOTE — Telephone Encounter (Signed)
-----   Message from Alda Berthold, DO sent at 11/05/2016  9:57 AM EST ----- Please inform patient that his vitamin B6 level is elevated, but not high enough to cause his lightheadedness. He does not need to take any additional vitamin B6 supplementation.  Thanks.

## 2016-11-19 ENCOUNTER — Encounter: Payer: Self-pay | Admitting: Neurology

## 2016-11-20 NOTE — Telephone Encounter (Signed)
I personally called patient to discuss his concerns stool moderate confusion. Currently, his symptoms including lightheadedness and exertional fatigue. He denies shortness of breath, arm or leg weakness, difficulty swallowing or talking, or double vision. He is very concerned about his plant-based diet and the possibility of nutritional deficiencies, specifically iodine deficiency causing his symptoms. We have checked his vitamin B 6, B1, and vitamin D which is within normal limits. He is getting monthly injections for vitamin B 12 deficiency. Specifically for minimal and iodine deficiency, I explained that this is beyond the realm of my expertise he should discuss this with his PCP. He is very worried about seeing his primary care doctor because of concern requiring respiratory illnesses from the office and has been self restricting himself at home. His ex-wife evened of his groceries for him. There is a overlay of anxiety.   He expresses interest in seeing integrative provider.  I had a lengthy discussion explaining why I do not see that his myasthenia is contributing to his symptoms. He has been on high-dose prednisone 60 mg and did not notice any improvement. Now that he is on prednisone 10 mg daily he is symptomatically unchanged. Some of his exertional fatigue may be stemming from diastolic dysfunction as seen on his echocardiogram. I do recommend that he see cardiology to be sure all possibilities for his fatigue have been addressed.   Marcus Petrow K. Posey Pronto, DO

## 2016-11-21 ENCOUNTER — Other Ambulatory Visit: Payer: Self-pay | Admitting: *Deleted

## 2016-11-21 ENCOUNTER — Encounter: Payer: Self-pay | Admitting: Neurology

## 2016-11-21 DIAGNOSIS — I5189 Other ill-defined heart diseases: Secondary | ICD-10-CM

## 2016-11-21 DIAGNOSIS — T733XXA Exhaustion due to excessive exertion, initial encounter: Secondary | ICD-10-CM

## 2016-11-22 ENCOUNTER — Encounter: Payer: Self-pay | Admitting: Neurology

## 2016-11-28 ENCOUNTER — Encounter: Payer: Self-pay | Admitting: Neurology

## 2016-11-29 ENCOUNTER — Encounter: Payer: Self-pay | Admitting: Neurology

## 2016-12-07 ENCOUNTER — Telehealth: Payer: Self-pay | Admitting: Hematology & Oncology

## 2016-12-07 NOTE — Telephone Encounter (Signed)
Received letter from Patient requesting Lab appt at [facility]. Called patient and left a message for patient to return my call.       [facility] MARCH 2018  [facility]

## 2016-12-11 ENCOUNTER — Other Ambulatory Visit (HOSPITAL_BASED_OUTPATIENT_CLINIC_OR_DEPARTMENT_OTHER): Payer: Medicare Other

## 2016-12-11 DIAGNOSIS — D696 Thrombocytopenia, unspecified: Secondary | ICD-10-CM | POA: Diagnosis present

## 2016-12-11 LAB — CBC WITH DIFFERENTIAL/PLATELET
BASO%: 0.2 % (ref 0.0–2.0)
BASOS ABS: 0 10*3/uL (ref 0.0–0.1)
EOS ABS: 0.1 10*3/uL (ref 0.0–0.5)
EOS%: 0.7 % (ref 0.0–7.0)
HEMATOCRIT: 38.1 % — AB (ref 38.4–49.9)
HEMOGLOBIN: 12.6 g/dL — AB (ref 13.0–17.1)
LYMPH#: 0.7 10*3/uL — AB (ref 0.9–3.3)
LYMPH%: 8.3 % — ABNORMAL LOW (ref 14.0–49.0)
MCH: 31.7 pg (ref 27.2–33.4)
MCHC: 33.1 g/dL (ref 32.0–36.0)
MCV: 95.7 fL (ref 79.3–98.0)
MONO#: 0.4 10*3/uL (ref 0.1–0.9)
MONO%: 4.9 % (ref 0.0–14.0)
NEUT#: 7.1 10*3/uL — ABNORMAL HIGH (ref 1.5–6.5)
NEUT%: 85.9 % — ABNORMAL HIGH (ref 39.0–75.0)
Platelets: 115 10*3/uL — ABNORMAL LOW (ref 140–400)
RBC: 3.98 10*6/uL — ABNORMAL LOW (ref 4.20–5.82)
RDW: 15.3 % — AB (ref 11.0–14.6)
WBC: 8.2 10*3/uL (ref 4.0–10.3)

## 2016-12-11 LAB — CHCC SATELLITE - SMEAR

## 2016-12-14 ENCOUNTER — Ambulatory Visit (HOSPITAL_BASED_OUTPATIENT_CLINIC_OR_DEPARTMENT_OTHER): Payer: Medicare Other | Admitting: Hematology & Oncology

## 2016-12-14 ENCOUNTER — Other Ambulatory Visit: Payer: Medicare Other

## 2016-12-14 VITALS — BP 164/81 | HR 70 | Temp 97.9°F | Resp 18 | Wt 181.5 lb

## 2016-12-14 DIAGNOSIS — E039 Hypothyroidism, unspecified: Secondary | ICD-10-CM

## 2016-12-14 DIAGNOSIS — D696 Thrombocytopenia, unspecified: Secondary | ICD-10-CM

## 2016-12-14 NOTE — Progress Notes (Signed)
Hematology and Oncology Follow Up Visit  Marcus [name] [id number] 01/29/1932 83 y.o. 12/14/2016   Principle Diagnosis:   Chronic thrombocytopenia-likely chronic immune thrombocytopenia   Myasthenia gravis  Hypothyroidism  Current Therapy:    Observation     Interim History:  Marcus [name] is back for second [facility] visit. We first saw him back in early February. At that point, his blood count was 105,000. He was not anemic or leukopenic.  He says he still does not feel all that well. He does have quite a few autoimmune issues. He showed me some thyroid studies that he had done. They appear to be relatively normal.  He's had no bleeding. He's had no bruising.  He is on some prednisone. I suspect this is by for the myasthenia.  There does not been any fever. He's had no cough. He has had no change in bowel or bladder habits.  Overall, his performance status is ECOG 1.  Medications:  Current Outpatient Prescriptions:  .  Cholecalciferol (VITAMIN D3) 1000 units CAPS, Take by mouth., Disp: , Rfl:  .  levothyroxine (SYNTHROID) 75 MCG tablet, Take 75 mcg by mouth daily before breakfast. , Disp: , Rfl:  .  predniSONE (DELTASONE) 10 MG tablet, Take 2 tablets x 2 weeks, then reduce to 1 tablet daily., Disp: 100 tablet, Rfl: 3 .  pyridostigmine (MESTINON) 60 MG tablet, Take 1 tablet (60 mg total) by mouth 3 (three) times daily., Disp: 270 tablet, Rfl: 3 .  vitamin B-12 (CYANOCOBALAMIN) 1000 MCG tablet, Take by mouth., Disp: , Rfl:   Allergies:  Allergies  Allergen Reactions  . Statins Hives    Past Medical History, Surgical history, Social history, and Family History were reviewed and updated.  Review of Systems:  As above  Physical Exam:  weight is 181 lb 8 oz (82.3 kg). His oral temperature is 97.9 F (36.6 C). His blood pressure is 164/81 (abnormal) and his pulse is 70. His respiration is 18 and oxygen saturation is 98%.   Wt Readings from Last 3 Encounters:  12/14/16  181 lb 8 oz (82.3 kg)  11/02/16 180 lb 7 oz (81.8 kg)  11/01/16 176 lb 4 oz (79.9 kg)     Head and neck exam shows no ocular or oral lesions. There are no palpable cervical or supraclavicular lymph nodes. Lungs are clear bilaterally. Cardiac exam regular rate and rhythm with no murmurs, rubs or bruits. Abdomen is soft. He has good bowel sounds. There is no fluid wave. There is no palpable abdominal mass. There is no palpable liver or spleen tip. Back exam shows no tenderness over the spine, ribs or hips. Extremities shows no clubbing, cyanosis or edema. He has good range of motion of his joints. Skin exam shows no rashes, ecchymoses or petechia. He has numerous seborrheic keratoses. He has some hyperpigmented lesions. I do not see anything that looks suspicious. Neurological exam shows no focal neurological deficits.  Lab Results  Component Value Date   WBC 8.2 12/11/2016   HGB 12.6 (L) 12/11/2016   HCT 38.1 (L) 12/11/2016   MCV 95.7 12/11/2016   PLT (L) 12/11/2016    115 Occ large plts present, Platelet count consistent in citrate     Chemistry      Component Value Date/Time   NA 140 10/03/2016 1204   K 4.8 10/03/2016 1204   CL 103 10/03/2016 1204   CO2 27 10/03/2016 1204   BUN 14 10/03/2016 1204   CREATININE 0.90 10/03/2016 1204  Component Value Date/Time   CALCIUM 9.0 10/03/2016 1204   ALKPHOS 80 09/17/2016 1540   AST 18 09/17/2016 1540   ALT 22 09/17/2016 1540   BILITOT 0.6 09/17/2016 1540         Impression and Plan: Mr. Lehrke is a 83 year old white male. His platelet count appears to be better. I looked as blood under the microscope. I did not see anything that looked suspicious.  I'm sure that he has a component of immune-based thrombocytopenia. However, his platelet count just is not low enough that warrants any intervention.  I think that we can probably get him back in about 3 months now. I know that he sees quite a few other doctors.   Volanda Napoleon,  MD 3/16/20181:30 PM

## 2016-12-17 ENCOUNTER — Encounter: Payer: Self-pay | Admitting: Hematology & Oncology

## 2016-12-20 ENCOUNTER — Encounter: Payer: Self-pay | Admitting: Hematology & Oncology

## 2016-12-21 DIAGNOSIS — E039 Hypothyroidism, unspecified: Secondary | ICD-10-CM | POA: Diagnosis not present

## 2016-12-31 ENCOUNTER — Ambulatory Visit (INDEPENDENT_AMBULATORY_CARE_PROVIDER_SITE_OTHER): Payer: Medicare Other | Admitting: Internal Medicine

## 2016-12-31 DIAGNOSIS — R0609 Other forms of dyspnea: Secondary | ICD-10-CM | POA: Diagnosis not present

## 2016-12-31 LAB — PULMONARY FUNCTION TEST
DL/VA % PRED: 108 %
DL/VA: 4.84 ml/min/mmHg/L
DLCO cor % pred: 79 %
DLCO cor: 23.68 ml/min/mmHg
DLCO unc % pred: 77 %
DLCO unc: 23.06 ml/min/mmHg
FEF 25-75 PRE: 1.49 L/s
FEF2575-%Pred-Pre: 89 %
FEV1-%PRED-PRE: 95 %
FEV1-PRE: 2.41 L
FEV1FVC-%Pred-Pre: 98 %
FEV6-%Pred-Pre: 102 %
FEV6-PRE: 3.42 L
FEV6FVC-%PRED-PRE: 106 %
FVC-%Pred-Pre: 96 %
FVC-PRE: 3.46 L
Pre FEV1/FVC ratio: 70 %
Pre FEV6/FVC Ratio: 99 %

## 2016-12-31 NOTE — Progress Notes (Signed)
PFT done today. 

## 2017-01-02 NOTE — Progress Notes (Signed)
Spoke with pt and notified of results per Dr. Wert. Pt verbalized understanding and denied any questions. 

## 2017-01-04 ENCOUNTER — Encounter: Payer: Self-pay | Admitting: Neurology

## 2017-01-04 ENCOUNTER — Ambulatory Visit (INDEPENDENT_AMBULATORY_CARE_PROVIDER_SITE_OTHER): Payer: Medicare Other | Admitting: Neurology

## 2017-01-04 VITALS — BP 120/80 | HR 68 | Ht 68.0 in | Wt 174.5 lb

## 2017-01-04 DIAGNOSIS — G7000 Myasthenia gravis without (acute) exacerbation: Secondary | ICD-10-CM
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
## 2017-01-04 NOTE — Patient Instructions (Signed)
Return to clinic in 3 months

## 2017-01-09 DIAGNOSIS — E039 Hypothyroidism, unspecified: Secondary | ICD-10-CM | POA: Diagnosis not present

## 2017-01-15 DIAGNOSIS — R5383 Other fatigue: Secondary | ICD-10-CM | POA: Insufficient documentation

## 2017-01-15 DIAGNOSIS — E039 Hypothyroidism, unspecified: Secondary | ICD-10-CM | POA: Diagnosis not present

## 2017-01-24 DIAGNOSIS — M47819 Spondylosis without myelopathy or radiculopathy, site unspecified: Secondary | ICD-10-CM | POA: Diagnosis not present

## 2017-01-24 DIAGNOSIS — M50323 Other cervical disc degeneration at C6-C7 level: Secondary | ICD-10-CM | POA: Diagnosis not present

## 2017-01-30 ENCOUNTER — Encounter: Payer: Self-pay | Admitting: Neurology

## 2017-01-30 DIAGNOSIS — E039 Hypothyroidism, unspecified: Secondary | ICD-10-CM | POA: Diagnosis not present

## 2017-01-30 DIAGNOSIS — R5383 Other fatigue: Secondary | ICD-10-CM | POA: Diagnosis not present

## 2017-02-11 DIAGNOSIS — D225 Melanocytic nevi of trunk: Secondary | ICD-10-CM | POA: Diagnosis not present

## 2017-02-11 DIAGNOSIS — D485 Neoplasm of uncertain behavior of skin: Secondary | ICD-10-CM | POA: Diagnosis not present

## 2017-02-11 DIAGNOSIS — L570 Actinic keratosis: Secondary | ICD-10-CM | POA: Diagnosis not present

## 2017-02-11 DIAGNOSIS — D224 Melanocytic nevi of scalp and neck: Secondary | ICD-10-CM | POA: Diagnosis not present

## 2017-02-11 DIAGNOSIS — D1801 Hemangioma of skin and subcutaneous tissue: Secondary | ICD-10-CM | POA: Diagnosis not present

## 2017-02-11 DIAGNOSIS — L821 Other seborrheic keratosis: Secondary | ICD-10-CM | POA: Diagnosis not present

## 2017-02-18 DIAGNOSIS — E039 Hypothyroidism, unspecified: Secondary | ICD-10-CM | POA: Diagnosis not present

## 2017-02-26 ENCOUNTER — Encounter: Payer: Self-pay | Admitting: Hematology & Oncology

## 2017-02-26 ENCOUNTER — Encounter: Payer: Self-pay | Admitting: Neurology

## 2017-02-26 ENCOUNTER — Other Ambulatory Visit: Payer: Self-pay | Admitting: *Deleted

## 2017-02-26 DIAGNOSIS — D696 Thrombocytopenia, unspecified: Secondary | ICD-10-CM

## 2017-03-05 DIAGNOSIS — D485 Neoplasm of uncertain behavior of skin: Secondary | ICD-10-CM | POA: Diagnosis not present

## 2017-03-05 DIAGNOSIS — L988 Other specified disorders of the skin and subcutaneous tissue: Secondary | ICD-10-CM | POA: Diagnosis not present

## 2017-03-08 ENCOUNTER — Encounter: Payer: Self-pay | Admitting: Neurology

## 2017-03-08 ENCOUNTER — Ambulatory Visit (INDEPENDENT_AMBULATORY_CARE_PROVIDER_SITE_OTHER): Payer: Medicare Other | Admitting: Neurology

## 2017-03-08 VITALS — BP 130/80 | HR 70 | Ht 68.0 in | Wt 159.4 lb

## 2017-03-08 DIAGNOSIS — M542 Cervicalgia: Secondary | ICD-10-CM

## 2017-03-08 DIAGNOSIS — G7000 Myasthenia gravis without (acute) exacerbation: Secondary | ICD-10-CM

## 2017-03-08 MED ORDER — PREDNISONE 5 MG PO TABS: 5.0000 mg | ORAL_TABLET | Freq: Every day | ORAL | 3 refills | Status: DC

## 2017-03-08 NOTE — Patient Instructions (Addendum)
1. Continue prednisone 5mg  at bedtime 2. Start neck physiotherapy  Return to clinic in 3 months
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
## 2017-03-11 ENCOUNTER — Other Ambulatory Visit (HOSPITAL_BASED_OUTPATIENT_CLINIC_OR_DEPARTMENT_OTHER): Payer: Medicare Other

## 2017-03-11 DIAGNOSIS — E039 Hypothyroidism, unspecified: Secondary | ICD-10-CM | POA: Diagnosis not present

## 2017-03-11 DIAGNOSIS — D510 Vitamin B12 deficiency anemia due to intrinsic factor deficiency: Secondary | ICD-10-CM | POA: Diagnosis not present

## 2017-03-11 DIAGNOSIS — D696 Thrombocytopenia, unspecified: Secondary | ICD-10-CM | POA: Diagnosis not present

## 2017-03-11 LAB — RETICULOCYTES (CHCC)
Immature Retic Fract: 5.6 % (ref 3.00–10.60)
RBC: 4.06 10*6/uL — AB (ref 4.20–5.82)
RETIC CT ABS: 38.98 10*3/uL (ref 34.80–93.90)
Retic %: 0.96 % (ref 0.80–1.80)

## 2017-03-11 LAB — CBC WITH DIFFERENTIAL/PLATELET
BASO%: 0.7 % (ref 0.0–2.0)
Basophils Absolute: 0 10*3/uL (ref 0.0–0.1)
EOS%: 2.4 % (ref 0.0–7.0)
Eosinophils Absolute: 0.2 10*3/uL (ref 0.0–0.5)
HCT: 38.3 % — ABNORMAL LOW (ref 38.4–49.9)
HGB: 13 g/dL (ref 13.0–17.1)
LYMPH%: 17.3 % (ref 14.0–49.0)
MCH: 31 pg (ref 27.2–33.4)
MCHC: 33.9 g/dL (ref 32.0–36.0)
MCV: 91.5 fL (ref 79.3–98.0)
MONO#: 0.7 10*3/uL (ref 0.1–0.9)
MONO%: 9.2 % (ref 0.0–14.0)
NEUT%: 70.4 % (ref 39.0–75.0)
NEUTROS ABS: 5 10*3/uL (ref 1.5–6.5)
RBC: 4.18 10*6/uL — AB (ref 4.20–5.82)
RDW: 14 % (ref 11.0–14.6)
WBC: 7.2 10*3/uL (ref 4.0–10.3)
lymph#: 1.2 10*3/uL (ref 0.9–3.3)

## 2017-03-11 LAB — IRON AND TIBC
%SAT: 26 % (ref 20–55)
Iron: 68 ug/dL (ref 42–163)
TIBC: 263 ug/dL (ref 202–409)
UIBC: 194 ug/dL (ref 117–376)

## 2017-03-11 LAB — FERRITIN: Ferritin: 308 ng/ml (ref 22–316)

## 2017-03-18 LAB — HEMOCHROMATOSIS DNA-PCR(C282Y,H63D)

## 2017-03-21 DIAGNOSIS — G4709 Other insomnia: Secondary | ICD-10-CM | POA: Diagnosis not present

## 2017-03-21 DIAGNOSIS — E038 Other specified hypothyroidism: Secondary | ICD-10-CM | POA: Diagnosis not present

## 2017-03-21 DIAGNOSIS — R05 Cough: Secondary | ICD-10-CM | POA: Diagnosis not present

## 2017-03-21 DIAGNOSIS — J984 Other disorders of lung: Secondary | ICD-10-CM | POA: Diagnosis not present

## 2017-03-21 DIAGNOSIS — Z6824 Body mass index (BMI) 24.0-24.9, adult: Secondary | ICD-10-CM | POA: Diagnosis not present

## 2017-03-21 DIAGNOSIS — Z1389 Encounter for screening for other disorder: Secondary | ICD-10-CM | POA: Diagnosis not present

## 2017-03-21 DIAGNOSIS — R42 Dizziness and giddiness: Secondary | ICD-10-CM | POA: Diagnosis not present

## 2017-03-22 ENCOUNTER — Ambulatory Visit (HOSPITAL_BASED_OUTPATIENT_CLINIC_OR_DEPARTMENT_OTHER): Payer: Medicare Other | Admitting: Hematology & Oncology

## 2017-03-22 VITALS — BP 134/67 | HR 64 | Temp 98.2°F | Resp 16 | Wt 162.0 lb

## 2017-03-22 DIAGNOSIS — D696 Thrombocytopenia, unspecified: Secondary | ICD-10-CM

## 2017-03-22 DIAGNOSIS — E032 Hypothyroidism due to medicaments and other exogenous substances: Secondary | ICD-10-CM

## 2017-03-22 DIAGNOSIS — G7000 Myasthenia gravis without (acute) exacerbation: Secondary | ICD-10-CM

## 2017-03-22 NOTE — Progress Notes (Signed)
Hematology and Oncology Follow Up Visit  Marcus [name] [id number] 1932-05-09 82 y.o. 03/22/2017   Principle Diagnosis:   Chronic thrombocytopenia-likely chronic immune thrombocytopenia   Myasthenia gravis  Hypothyroidism  Current Therapy:    Observation     Interim History:  Marcus [name] is back for follow-up. He is doing okay. He was worried that he had hemochromatosis. He has an elevated ferritin. His ferritin was 308. This really is not all that elevated. We did do the hemochromatosis genetic assay. No surprise, this was negative.  I told him that the ferritin is elevated in other conditions beside iron overload. In his case, I suspect it probably is from arthritis or some chronic inflammatory state. He does have myasthenia. This might be a factor.  I think what is important is that his iron saturation is only 26%. I think this is much more important.   His thrombocytopenia is stable. This is not a problem for him. It is not low enough that would cause any bleeding.   His appetite is good. He's had no nausea or vomiting. He's had no change in bowel or bladder habits.   Overall, his performance status is ECOG 1.  Medications:  Current Outpatient Prescriptions:  .  Cholecalciferol (VITAMIN D3) 1000 units CAPS, Take by mouth., Disp: , Rfl:  .  levothyroxine (SYNTHROID) 75 MCG tablet, Take 75 mcg by mouth daily before breakfast. , Disp: , Rfl:  .  predniSONE (DELTASONE) 5 MG tablet, Take 1 tablet (5 mg total) by mouth daily with breakfast. Take extra tablet as needed, Disp: 120 tablet, Rfl: 3 .  pyridostigmine (MESTINON) 60 MG tablet, Take 1 tablet (60 mg total) by mouth 3 (three) times daily., Disp: 270 tablet, Rfl: 3 .  vitamin B-12 (CYANOCOBALAMIN) 1000 MCG tablet, Take by mouth., Disp: , Rfl:   Allergies:  Allergies  Allergen Reactions  . Statins Hives    Past Medical History, Surgical history, Social history, and Family History were reviewed and updated.  Review of  Systems:  As above  Physical Exam:  weight is 162 lb (73.5 kg). His oral temperature is 98.2 F (36.8 C). His blood pressure is 134/67 and his pulse is 64. His respiration is 16 and oxygen saturation is 97%.   Wt Readings from Last 3 Encounters:  03/22/17 162 lb (73.5 kg)  03/08/17 159 lb 6 oz (72.3 kg)  01/04/17 174 lb 8 oz (79.2 kg)     Head and neck exam shows no ocular or oral lesions. There are no palpable cervical or supraclavicular lymph nodes. Lungs are clear bilaterally. Cardiac exam regular rate and rhythm with no murmurs, rubs or bruits. Abdomen is soft. He has good bowel sounds. There is no fluid wave. There is no palpable abdominal mass. There is no palpable liver or spleen tip. Back exam shows no tenderness over the spine, ribs or hips. Extremities shows no clubbing, cyanosis or edema. He has good range of motion of his joints. Skin exam shows no rashes, ecchymoses or petechia. He has numerous seborrheic keratoses. He has some hyperpigmented lesions. I do not see anything that looks suspicious. Neurological exam shows no focal neurological deficits.  Lab Results  Component Value Date   WBC 7.2 03/11/2017   HGB 13.0 03/11/2017   HCT 38.3 (L) 03/11/2017   MCV 91.5 03/11/2017   PLT 114 Platelet count consistent in citrate (L) 03/11/2017     Chemistry      Component Value Date/Time   NA 140 10/03/2016 1204  K 4.8 10/03/2016 1204   CL 103 10/03/2016 1204   CO2 27 10/03/2016 1204   BUN 14 10/03/2016 1204   CREATININE 0.90 10/03/2016 1204      Component Value Date/Time   CALCIUM 9.0 10/03/2016 1204   ALKPHOS 80 09/17/2016 1540   AST 18 09/17/2016 1540   ALT 22 09/17/2016 1540   BILITOT 0.6 09/17/2016 1540       Impression and Plan: Marcus [name] is a 83 year old white male. His platelet count appears to be better. I looked as blood under the microscope. I did not see anything that looked suspicious.  I reassured him about his ferritin.  From my point of view, we  can get him back in 6 months. I think this would be very reasonable.  I told him that he can always come back sooner if there are any issues with bleeding.  We will continue to follow his ferritin just for his peace of mind.  Volanda Napoleon, MD 6/22/20181:21 PM

## 2017-03-28 DIAGNOSIS — Z961 Presence of intraocular lens: Secondary | ICD-10-CM | POA: Diagnosis not present

## 2017-03-28 DIAGNOSIS — H26491 Other secondary cataract, right eye: Secondary | ICD-10-CM | POA: Diagnosis not present

## 2017-03-28 DIAGNOSIS — H02105 Unspecified ectropion of left lower eyelid: Secondary | ICD-10-CM | POA: Diagnosis not present

## 2017-03-28 DIAGNOSIS — D3131 Benign neoplasm of right choroid: Secondary | ICD-10-CM | POA: Diagnosis not present

## 2017-04-01 DIAGNOSIS — E038 Other specified hypothyroidism: Secondary | ICD-10-CM | POA: Diagnosis not present

## 2017-04-12 ENCOUNTER — Encounter: Payer: Self-pay | Admitting: Hematology & Oncology

## 2017-05-02 DIAGNOSIS — Z6827 Body mass index (BMI) 27.0-27.9, adult: Secondary | ICD-10-CM | POA: Diagnosis not present

## 2017-05-02 DIAGNOSIS — R030 Elevated blood-pressure reading, without diagnosis of hypertension: Secondary | ICD-10-CM | POA: Diagnosis not present

## 2017-05-06 DIAGNOSIS — R030 Elevated blood-pressure reading, without diagnosis of hypertension: Secondary | ICD-10-CM | POA: Diagnosis not present

## 2017-05-14 DIAGNOSIS — E038 Other specified hypothyroidism: Secondary | ICD-10-CM | POA: Diagnosis not present

## 2017-06-12 DIAGNOSIS — H6123 Impacted cerumen, bilateral: Secondary | ICD-10-CM | POA: Diagnosis not present

## 2017-06-17 ENCOUNTER — Ambulatory Visit (INDEPENDENT_AMBULATORY_CARE_PROVIDER_SITE_OTHER): Payer: Medicare Other | Admitting: Neurology

## 2017-06-17 ENCOUNTER — Encounter: Payer: Self-pay | Admitting: Neurology

## 2017-06-17 VITALS — BP 120/80 | HR 63 | Ht 68.0 in | Wt 158.3 lb

## 2017-06-17 DIAGNOSIS — G7000 Myasthenia gravis without (acute) exacerbation: Secondary | ICD-10-CM
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
## 2017-07-24 DIAGNOSIS — G7000 Myasthenia gravis without (acute) exacerbation: Secondary | ICD-10-CM | POA: Diagnosis not present

## 2017-07-24 DIAGNOSIS — J984 Other disorders of lung: Secondary | ICD-10-CM | POA: Diagnosis not present

## 2017-07-24 DIAGNOSIS — D696 Thrombocytopenia, unspecified: Secondary | ICD-10-CM | POA: Diagnosis not present

## 2017-07-24 DIAGNOSIS — N401 Enlarged prostate with lower urinary tract symptoms: Secondary | ICD-10-CM | POA: Diagnosis not present

## 2017-07-24 DIAGNOSIS — E038 Other specified hypothyroidism: Secondary | ICD-10-CM | POA: Diagnosis not present

## 2017-07-24 DIAGNOSIS — Z6823 Body mass index (BMI) 23.0-23.9, adult: Secondary | ICD-10-CM | POA: Diagnosis not present

## 2017-07-24 DIAGNOSIS — R030 Elevated blood-pressure reading, without diagnosis of hypertension: Secondary | ICD-10-CM | POA: Diagnosis not present

## 2017-07-24 DIAGNOSIS — Z1389 Encounter for screening for other disorder: Secondary | ICD-10-CM | POA: Diagnosis not present

## 2017-07-24 DIAGNOSIS — I480 Paroxysmal atrial fibrillation: Secondary | ICD-10-CM | POA: Diagnosis not present

## 2017-08-20 DIAGNOSIS — H10503 Unspecified blepharoconjunctivitis, bilateral: Secondary | ICD-10-CM | POA: Diagnosis not present

## 2017-08-20 DIAGNOSIS — H02105 Unspecified ectropion of left lower eyelid: Secondary | ICD-10-CM | POA: Diagnosis not present

## 2017-08-20 DIAGNOSIS — H02102 Unspecified ectropion of right lower eyelid: Secondary | ICD-10-CM | POA: Diagnosis not present

## 2017-09-16 DIAGNOSIS — H10503 Unspecified blepharoconjunctivitis, bilateral: Secondary | ICD-10-CM | POA: Diagnosis not present

## 2017-09-16 DIAGNOSIS — H02105 Unspecified ectropion of left lower eyelid: Secondary | ICD-10-CM | POA: Diagnosis not present

## 2017-09-16 DIAGNOSIS — H02102 Unspecified ectropion of right lower eyelid: Secondary | ICD-10-CM | POA: Diagnosis not present

## 2017-09-19 DIAGNOSIS — H02132 Senile ectropion of right lower eyelid: Secondary | ICD-10-CM | POA: Diagnosis not present

## 2017-09-19 DIAGNOSIS — H04123 Dry eye syndrome of bilateral lacrimal glands: Secondary | ICD-10-CM | POA: Diagnosis not present

## 2017-09-19 DIAGNOSIS — H02135 Senile ectropion of left lower eyelid: Secondary | ICD-10-CM | POA: Diagnosis not present

## 2017-09-25 DIAGNOSIS — E038 Other specified hypothyroidism: Secondary | ICD-10-CM | POA: Diagnosis not present

## 2017-10-21 DIAGNOSIS — N401 Enlarged prostate with lower urinary tract symptoms: Secondary | ICD-10-CM | POA: Diagnosis not present

## 2017-11-04 DIAGNOSIS — H04521 Eversion of right lacrimal punctum: Secondary | ICD-10-CM | POA: Diagnosis not present

## 2017-11-04 DIAGNOSIS — H02532 Eyelid retraction right lower eyelid: Secondary | ICD-10-CM | POA: Diagnosis not present

## 2017-11-04 DIAGNOSIS — H11821 Conjunctivochalasis, right eye: Secondary | ICD-10-CM | POA: Diagnosis not present

## 2017-11-04 DIAGNOSIS — H02132 Senile ectropion of right lower eyelid: Secondary | ICD-10-CM | POA: Diagnosis not present

## 2017-11-04 DIAGNOSIS — H02112 Cicatricial ectropion of right lower eyelid: Secondary | ICD-10-CM | POA: Diagnosis not present

## 2017-11-04 DIAGNOSIS — H0589 Other disorders of orbit: Secondary | ICD-10-CM | POA: Diagnosis not present

## 2017-11-04 DIAGNOSIS — H04561 Stenosis of right lacrimal punctum: Secondary | ICD-10-CM | POA: Diagnosis not present

## 2017-12-11 DIAGNOSIS — L820 Inflamed seborrheic keratosis: Secondary | ICD-10-CM | POA: Diagnosis not present

## 2017-12-11 DIAGNOSIS — L821 Other seborrheic keratosis: Secondary | ICD-10-CM | POA: Diagnosis not present

## 2017-12-16 DIAGNOSIS — N401 Enlarged prostate with lower urinary tract symptoms: Secondary | ICD-10-CM | POA: Diagnosis not present

## 2018-01-27 DIAGNOSIS — H04123 Dry eye syndrome of bilateral lacrimal glands: Secondary | ICD-10-CM | POA: Diagnosis not present

## 2018-01-27 DIAGNOSIS — Z09 Encounter for follow-up examination after completed treatment for conditions other than malignant neoplasm: Secondary | ICD-10-CM | POA: Diagnosis not present

## 2018-02-12 ENCOUNTER — Ambulatory Visit (INDEPENDENT_AMBULATORY_CARE_PROVIDER_SITE_OTHER): Payer: Medicare Other | Admitting: Pulmonary Disease

## 2018-02-12 ENCOUNTER — Ambulatory Visit (INDEPENDENT_AMBULATORY_CARE_PROVIDER_SITE_OTHER)
Admission: RE | Admit: 2018-02-12 | Discharge: 2018-02-12 | Disposition: A | Discharge status: Home / Self Care | Payer: Medicare Other | Source: Ambulatory Visit | Attending: Pulmonary Disease | Admitting: Pulmonary Disease

## 2018-02-12 ENCOUNTER — Encounter: Payer: Self-pay | Admitting: Pulmonary Disease

## 2018-02-12 VITALS — BP 140/90 | HR 72 | Temp 98.0°F | Ht 68.0 in | Wt 156.0 lb

## 2018-02-12 DIAGNOSIS — J45909 Unspecified asthma, uncomplicated: Secondary | ICD-10-CM | POA: Diagnosis not present

## 2018-02-12 DIAGNOSIS — R0602 Shortness of breath: Secondary | ICD-10-CM | POA: Diagnosis not present

## 2018-02-12 DIAGNOSIS — R05 Cough: Secondary | ICD-10-CM | POA: Diagnosis not present

## 2018-02-12 LAB — NITRIC OXIDE: Nitric Oxide: 10

## 2018-02-12 MED ORDER — AZITHROMYCIN 250 MG PO TABS: ORAL_TABLET | ORAL | 0 refills | Status: AC

## 2018-02-12 MED ORDER — PREDNISONE 10 MG PO TABS: ORAL_TABLET | ORAL | 0 refills | Status: DC

## 2018-02-12 NOTE — Progress Notes (Signed)
Marcus [name]    [id number]    08-29-1932  Primary Care Physician:South, Annie Main, MD  Referring Physician: Chesley Noon, MD [name], [place] [address]  Chief complaint: Acute visit for cough, dyspnea  HPI: 84 year old with history of myasthenia gravis on chronic prednisone, Mestinon therapy.  Previously seen in the clinic for work-up of asthma however PFTs are normal.  Patient is off inhalers  Here as an acute visit for acute onset cough, dyspnea, chills.  Denies any mucus production.  No sick contacts Patient states that the last time he felt like this he had a community-acquired pneumonia in 2016.   Outpatient Encounter Medications as of 02/12/2018  Medication Sig  . levothyroxine (SYNTHROID, LEVOTHROID) 100 MCG tablet TK 1 T PO ONCE A DAY  . liothyronine (CYTOMEL) 5 MCG tablet TK 1 TO 2 TS PO QAM 60 MINUTES BEFORE FOOD OR DRINK.  Marland Kitchen predniSONE (DELTASONE) 5 MG tablet Take 1 tablet (5 mg total) by mouth daily with breakfast. Take extra tablet as needed  . pyridostigmine (MESTINON) 60 MG tablet Take 1 tablet (60 mg total) by mouth 3 (three) times daily.  . vitamin B-12 (CYANOCOBALAMIN) 1000 MCG tablet Take by mouth.  . [DISCONTINUED] Cholecalciferol (VITAMIN D3) 1000 units CAPS Take by mouth.  . [DISCONTINUED] levothyroxine (SYNTHROID) 75 MCG tablet Take 75 mcg by mouth daily before breakfast.    No facility-administered encounter medications on file as of 02/12/2018.     Allergies as of 02/12/2018 - Review Complete 02/12/2018  Allergen Reaction Noted  . Statins Hives 04/13/2015    Past Medical History:  Diagnosis Date  . Acute on chronic respiratory failure ([facility]) 04/15/2015  . CAP (community acquired pneumonia) 04/13/2015   See cxr 04/13/2015 > admit [facility]    . H/O hiatal hernia   . Hypothyroidism 04/13/2015  . Myasthenia gravis ([place]) 04/13/2015  . Obesity 04/13/2015  . Paroxysmal A-fib ([place]) 04/16/2015  . Sepsis ([facility]) 04/15/2015    Past  Surgical History:  Procedure Laterality Date  . AMPUTATION  06/11/2012   Procedure: AMPUTATION DIGIT;  Surgeon: Marcus Hoff, MD;  Location: [facility];  Service: Orthopedics;  Laterality: Left;  revision of amputation  . HERNIA REPAIR  1994  . ORIF SHOULDER FRACTURE  06/13/2012   Procedure: OPEN REDUCTION INTERNAL FIXATION (ORIF) SHOULDER FRACTURE;  Surgeon: Marcus Schooling, MD;  Location: [facility];  Service: Orthopedics;  Laterality: Left;  LEFT SHOULDER OPEN GREATER TUBEROSITY ORIF  . SHOULDER CLOSED REDUCTION  06/11/2012   Procedure: CLOSED MANIPULATION SHOULDER;  Surgeon: Marcus Hoff, MD;  Location: [facility];  Service: Orthopedics;  Laterality: Left;    Family History  Problem Relation Age of Onset  . Other Mother        Deceased, 19  . Heart attack Father        Deceased, 5  . Healthy Sister   . Healthy Daughter        x 4  . Healthy Son        x 3    Social History   Socioeconomic History  . Marital status: Single    Spouse name: Not on file  . Number of children: 7  . Years of education: Not on file  . Highest education level: Not on file  Occupational History  . Not on file  Social Needs  . Financial resource strain: Not on file  . Food insecurity:    Worry: Not on  file    Inability: Not on file  . Transportation needs:    Medical: Not on file    Non-medical: Not on file  Tobacco Use  . Smoking status: Former Smoker    Packs/day: 1.00    Years: 30.00    Pack years: 30.00    Types: Cigarettes    Last attempt to quit: 01/10/1982    Years since quitting: 36.1  . Smokeless tobacco: Never Used  Substance and Sexual Activity  . Alcohol use: Yes    Alcohol/week: 1.2 oz    Types: 2 Glasses of wine per week    Comment: 3-4 per week   . Drug use: No  . Sexual activity: Never  Lifestyle  . Physical activity:    Days per week: Not on file    Minutes per session: Not on file  . Stress: Not on file  Relationships  . Social connections:    Talks on phone: Not on file     Gets together: Not on file    Attends religious service: Not on file    Active member of club or organization: Not on file    Attends meetings of clubs or organizations: Not on file    Relationship status: Not on file  . Intimate partner violence:    Fear of current or ex partner: Not on file    Emotionally abused: Not on file    Physically abused: Not on file    Forced sexual activity: Not on file  Other Topics Concern  . Not on file  Social History Narrative   Lives alone in a two story home.  Divorced.  Has 7 children.     Retired from [employer].     Education: some college.    Review of systems: Review of Systems  Constitutional: Negative for fever and chills.  HENT: Negative.   Eyes: Negative for blurred vision.  Respiratory: as per HPI  Cardiovascular: Negative for chest pain and palpitations.  Gastrointestinal: Negative for vomiting, diarrhea, blood per rectum. Genitourinary: Negative for dysuria, urgency, frequency and hematuria.  Musculoskeletal: Negative for myalgias, back pain and joint pain.  Skin: Negative for itching and rash.  Neurological: Negative for dizziness, tremors, focal weakness, seizures and loss of consciousness.  Endo/Heme/Allergies: Negative for environmental allergies.  Psychiatric/Behavioral: Negative for depression, suicidal ideas and hallucinations.  All other systems reviewed and are negative.  Physical Exam: Blood pressure 140/90, pulse 72, temperature 98 F (36.7 C), temperature source Oral, height 5\' 8"  (1.727 m), weight 156 lb (70.8 kg), SpO2 97 %. Gen:      No acute distress HEENT:  EOMI, sclera anicteric Neck:     No masses; no thyromegaly Lungs:    Clear to auscultation bilaterally; normal respiratory effort CV:         Regular rate and rhythm; no murmurs Abd:      + bowel sounds; soft, non-tender; no palpable masses, no distension Ext:    No edema; adequate peripheral perfusion Skin:      Warm and dry; no rash Neuro:  alert and oriented x 3 Psych: normal mood and affect  Data Reviewed: PFTs 12/31/2016 FVC 3.60 [96%], FEV1 2.52 (95%], F/F 71, TLC 75%, DLCO 77% Minimal restriction and diffusion defect  PET scan 07/10/2013-8 mm right lung nodule with no PET avidity. Chest x-ray 10/03/2016- right midlung pulmonary nodule.  Left base pleural thickening. I reviewed the images personally.  Assessment/Plan: Acute visit for dyspnea, cough Chest x-ray today to evaluate  for pneumonia Give Z-Pak Increase prednisone to 40 mg and taper by 10 mg every 3 days until his baseline dose of 5 mg Discussed temporary use of inhalers during this acute episode but patient would like to avoid  Follow-up with Dr. Michiel Sites MD [facility] 02/12/2018, 9:28 AM  CC: Marcus Noon, MD

## 2018-02-12 NOTE — Patient Instructions (Signed)
I am sorry not feeling well We will get a chest x-ray today to evaluate for pneumonia. We will give you a Z-Pak Increase the prednisone to 40 mg for 3 days then reduce dose by 10 mg every 3 days Follow-up in 3 months with Dr. Melvyn Novas

## 2018-02-18 ENCOUNTER — Encounter: Payer: Self-pay | Admitting: Neurology

## 2018-02-18 MED ORDER — PREDNISONE 5 MG PO TABS: 5.0000 mg | ORAL_TABLET | Freq: Every day | ORAL | 1 refills | Status: DC

## 2018-02-18 MED ORDER — PYRIDOSTIGMINE BROMIDE 60 MG PO TABS: 60.0000 mg | ORAL_TABLET | Freq: Three times a day (TID) | ORAL | 1 refills | Status: DC

## 2018-02-21 ENCOUNTER — Other Ambulatory Visit: Payer: Self-pay | Admitting: Pulmonary Disease

## 2018-04-01 DIAGNOSIS — H26492 Other secondary cataract, left eye: Secondary | ICD-10-CM | POA: Diagnosis not present

## 2018-04-01 DIAGNOSIS — H26491 Other secondary cataract, right eye: Secondary | ICD-10-CM | POA: Diagnosis not present

## 2018-04-01 DIAGNOSIS — D3131 Benign neoplasm of right choroid: Secondary | ICD-10-CM | POA: Diagnosis not present

## 2018-04-01 DIAGNOSIS — Z961 Presence of intraocular lens: Secondary | ICD-10-CM | POA: Diagnosis not present

## 2018-05-19 ENCOUNTER — Ambulatory Visit (INDEPENDENT_AMBULATORY_CARE_PROVIDER_SITE_OTHER): Payer: Medicare Other | Admitting: Internal Medicine

## 2018-05-19 ENCOUNTER — Encounter: Payer: Self-pay | Admitting: Internal Medicine

## 2018-05-19 VITALS — BP 110/62 | HR 60 | Ht 67.5 in | Wt 163.0 lb

## 2018-05-19 DIAGNOSIS — G7000 Myasthenia gravis without (acute) exacerbation: Secondary | ICD-10-CM | POA: Diagnosis not present

## 2018-05-19 NOTE — Progress Notes (Signed)
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    History of Present Illness  04/13/2015 1st [facility] office visit/ Marcus [name]   Chief Complaint  Patient presents with  . Pulmonary Consult    sob, self referral, tested for Whooping Cough by PCP, Returning from [place] 16 hr flight,MG x 3 years  followed by Dr Posey Pronto neurology  for [facility] and feels this is stable but acutely ill as above 2 d p jet travel initially with diarrhea resolved then  severe cough no vomiting > mucus is yellow and thick and hard to cough up, can no longer lie down in bed and sleep without cough and gasping for air. No better since zpak on 7/11 but no def fever at any pont.  Dx CAP  rec Admit [facility] > all better   Admit date: 04/13/2015 Discharge date: 04/17/2015   Recommendations for Outpatient Follow-Up:   1. F/U with PCP in 1 week to ensure resolution of symptoms. Consider repeat CXR in 3-4 weeks.   Discharge Diagnosis:   Principal Problem:   Sepsis secondary to CAP Active Problems:   CAP (community acquired pneumonia)   Myasthenia gravis   Hypothyroidism   Influenza A   Acute on chronic respiratory failure   Acute renal failure   Paroxysmal a-fib        12/15/2015 acute extended ov/Kaj Vasil re: cough /sob on prednisone 20 x one week  prior to [place] Complaint  Patient presents with  . Acute Visit    Pt c/o sob and cough x 3 days. Cough is prod with white sputum.   acute onset s travel this episode, comfortable at rest though more difficult in supine position due to sob/cough  last night first time this  happened since dx of CAP but comfortable at rest sitting. rec dulera 100 Take 2 puffs first thing in am and then another 2 puffs about 12 hours later.  For cough / congestion mucinex dm 1200 mg every 12 hours Levaquin 500mg  daily x 7 days   01/03/2016  f/u ov/Marcus [name] re: s/p acute ab/ off dulera x one week s flare  Chief Complaint  Patient presents with  . Follow-up    Breathing has improved back to his normal baseline. No new co's today.   Not limited by breathing from desired activities   rec Keep the dulera 100 sample on hand for recurrent cough or wheeze or short of breath and call me if start needing it for follow up appt Work on maintaining perfect inhaler technique:  relax and gently blow all the way out then take a nice smooth deep breath back in, triggering the inhaler at same time you start breathing in.  Hold for up to 5 seconds if you can. Blow out thru nose. Rinse and gargle with water when done Keep track of your inspiratory capacity on your incentive spirometer so you can communicate with your neurologist if you start noting a decline which would likely be related to MG     10/03/2016 acute extended ov/Marcus [name]  re: sob "I think it's my MG/ Dr Posey Pronto does not Chief Complaint  Patient presents with  . Acute Visit    Pt c/o increased DOE for the past 3 wks. He states that he is getting winded and tired just walking from one room to the next. He also has been hoarse for the past few wks, but no coughing.   did not start the dulera/ not following  IC at home as recommended  Acute onset "like overnight" sob developed x 3 weeks   s cough but legs and arms weaker than usual Prior to onset was able to sleep almost flat  and since then 30 degrees s am exac of cough/ congestion/ dysphagia / choking No worse late in day vs early  In day  rec Symbicort 80 Take 2 puffs first thing in am and then another 2 puffs about 12 hours later only use it if you feel it helps Incentive spirometry should be  checked twice daily to track your inspiratory muscle strength   If condition is worsening and can't get breath at rest, go to ER      05/19/2018  f/u ov/Marcus [name] re:  MG / no longer on resp rx at all  Chief Complaint  Patient presents with  . Follow-up    He is doing well at this time no symptoms.  Dyspnea:  Walking up to a quarter mile nl pace / avoids that but able to work out a gym 6 x weekly just wts/ not sob Cough: none Sleeping: on side 30 degrees bunch of pillows  SABA use: none  02: none     No obvious day to day or daytime variability or assoc excess/ purulent sputum or mucus plugs or hemoptysis or cp or chest tightness, subjective wheeze or overt sinus or hb symptoms.   Sleeping as above  without nocturnal  or early am exacerbation  of respiratory  c/o's or need for noct saba. Also denies any obvious fluctuation of symptoms with weather or environmental changes or other aggravating or alleviating factors except as outlined above   No unusual exposure hx or h/o childhood pna/ asthma or knowledge of premature birth.  Current Allergies, Complete Past Medical History, Past Surgical History, Family History, and Social History were reviewed in [facility] record.  ROS  The following are not active complaints unless bolded Hoarseness, sore throat, dysphagia, dental problems, itching, sneezing,  nasal congestion or discharge of excess mucus or purulent secretions, ear ache,   fever, chills, sweats, unintended wt loss or wt gain, classically pleuritic or exertional cp,  orthopnea pnd or arm/hand swelling  or leg swelling, presyncope, palpitations, abdominal pain, anorexia, nausea, vomiting, diarrhea  or change in bowel habits or change in bladder habits, change in stools or change in urine, dysuria, hematuria,  rash, arthralgias, visual complaints, headache, numbness, weakness or ataxia or problems with walking or coordination,  change in mood or  memory.         Current Meds  Medication Sig  . levothyroxine (SYNTHROID, LEVOTHROID) 100 MCG tablet TK 1 T PO ONCE A DAY  . predniSONE (DELTASONE) 5 MG tablet Take 1 tablet (5 mg total) by mouth daily with breakfast. Take extra tablet as needed  . pyridostigmine (MESTINON) 60 MG tablet Take 1 tablet (60 mg total) by mouth 3 (three) times daily.                   Objective:   Physical Exam  Pleasant  wm  - nad/ all smiles   05/19/2018          163  10/03/2016          185  01/03/2016          209   12/15/2015       206   04/13/15 210 lb 6.4 oz (95.437 kg)  06/13/12 205 lb 7.5 oz (93.2 kg)  06/11/12 202 lb (91.627 kg)    Vital signs reviewed - Note on arrival 02 sats  98% on RA   HEENT: nl dentition, turbinates bilaterally, and oropharynx. Nl external ear canals without cough reflex   NECK :  without JVD/Nodes/TM/ nl carotid upstrokes bilaterally   LUNGS: no acc muscle use,  Nl contour chest which is clear to A and P bilaterally without cough on insp or exp maneuvers   CV:  RRR  no s3 or murmur or increase in P2, and no edema   ABD:  soft and nontender with nl inspiratory excursion in the supine position. No bruits or organomegaly appreciated, bowel sounds nl  MS:  Nl gait/ ext warm without deformities, calf tenderness, cyanosis or clubbing No obvious joint restrictions   SKIN: warm and dry without lesions    NEURO:  alert, approp, nl sensorium with  no motor or cerebellar deficits apparent.                   Assessment:

## 2018-05-19 NOTE — Patient Instructions (Addendum)
Incentive spirometry at least twice daily - set the leveler at the first effort and beat it on the next 3 efforts    If you are satisfied with your treatment plan,  let your doctor know and he/she can either refill your medications or you can return here when your prescription runs out.     If in any way you are not 100% satisfied,  please tell us.  If 100% better, tell your friends!  Pulmonary follow up is as needed

## 2018-05-21 ENCOUNTER — Encounter: Payer: Self-pay | Admitting: Internal Medicine

## 2018-05-21 NOTE — Assessment & Plan Note (Signed)
Spirometry 01/03/2016   VC = 3.91 and repeat 10/03/2016  5.13   Doing much better on present rx  Reviewed use of IS to monitor diaprhagm strength   Pulmonary f/u is prn

## 2018-08-12 ENCOUNTER — Encounter: Payer: Self-pay | Admitting: Pulmonary Disease

## 2018-08-12 ENCOUNTER — Telehealth: Payer: Self-pay | Admitting: *Deleted

## 2018-08-12 ENCOUNTER — Ambulatory Visit (INDEPENDENT_AMBULATORY_CARE_PROVIDER_SITE_OTHER): Payer: Medicare Other | Admitting: Pulmonary Disease

## 2018-08-12 VITALS — BP 130/84 | HR 85 | Temp 97.7°F | Ht 68.0 in | Wt 165.0 lb

## 2018-08-12 DIAGNOSIS — G7000 Myasthenia gravis without (acute) exacerbation: Secondary | ICD-10-CM

## 2018-08-12 MED ORDER — PREDNISONE 10 MG PO TABS: ORAL_TABLET | ORAL | 0 refills | Status: DC

## 2018-08-12 NOTE — Telephone Encounter (Signed)
Yes message was forwarded to [facility]. Will await response

## 2018-08-12 NOTE — Patient Instructions (Addendum)
Prednisone 10mg  tablet  >>> 2 tabs for 5 days (20mg  daily), then 1 tab for 5 days (10mg  daily), then resume 5 mg daily >>>take with food  >>>take in the morning   If not improving please contact our office or present to ER   Follow-up with Dr. Melvyn Novas in the next 6 to 8 weeks or sooner if symptoms are not improving      November/2019 we will be moving! We will no longer be at our [place] location.  Be on the look out for a post card/mailer to let you know we have officially moved.  Our new address and phone number will be:  [address] [place], [place] [address] Telephone number: [phone]  It is flu season:   >>>Remember to be washing your hands regularly, using hand sanitizer, be careful to use around herself with has contact with people who are sick will increase her chances of getting sick yourself. >>> Best ways to protect herself from the flu: Receive the yearly flu vaccine, practice good hand hygiene washing with soap and also using hand sanitizer when available, eat a nutritious meals, get adequate rest, hydrate appropriately   Please contact the office if your symptoms worsen or you have concerns that you are not improving.   Thank you for choosing [facility] for your healthcare, and for allowing [place] to partner with you on your healthcare journey. I am thankful to be able to provide care to you today.   Wyn Quaker FNP-C

## 2018-08-12 NOTE — Telephone Encounter (Signed)
Can't tell over the phone or email - will need ov and if burning getting worse or  breathing getting shorter go to ER between now ov  - if any assoc fever, nausea or vomiting or pain with breathing go NOW

## 2018-08-12 NOTE — Telephone Encounter (Signed)
Patient sent a message to the front office stating that his health has gone down over the weekend and he is in a lot of pain.  I called him back and left message that we do not have an appointment available anytime soon and instructed him to go to the urgent care since he is in so much pain.  Requested for him to call back if he would like to schedule an appointment and to be placed on our waiting list.

## 2018-08-12 NOTE — Progress Notes (Signed)
@Patient  ID: Marcus [name], male    DOB: 1932/01/26, 84 y.o.   MRN: [id number]  Chief Complaint  Patient presents with  . Acute Visit    SOB    Referring provider: [name], MD  HPI:   PMH:  Smoker/ Smoking History:  Maintenance:   Pt of:   Recent [facility] Encounters:     08/12/2018  - Visit   84 year old male patient presenting today for acute visit.  Patient reporting that over the weekend he "overdid it".  Patient reported he was outside doing lots of different activities including cutting open boxes and setting up along furniture.  Patient reports that since then he has had increased shortness of breath and burning lung pain which resolves with rest.  Patient reports he initially tried to rest and see if he would recover.  But patient says that with exertion he continues to have recurrent symptoms.  Patient reports that he had the symptoms in the past before and typically needed to have a increased prednisone taper.  Patient wanted to be assessed and ensure they did not have worsening lung symptoms or wheezing. Patient continues to be on daily prednisone 5 mg.     Tests:    FENO:  Lab Results  Component Value Date   NITRICOXIDE 10 02/12/2018    PFT: PFT Results Latest Ref Rng & Units 12/31/2016  FVC-Pre L 3.46  FVC-Predicted Pre % 96  Pre FEV1/FVC % % 70  FEV1-Pre L 2.41  FEV1-Predicted Pre % 95  DLCO UNC% % 77  DLCO COR %Predicted % 108    Imaging: No results found.  Chart Review:    Specialty Problems      Pulmonary Problems   CAP (community acquired pneumonia)    See cxr 04/13/2015 > admit [facility]       Influenza A   Acute asthmatic bronchitis    Spirometry 01/03/2016  Nl off all inhalers/ VC = 4l  - Spirometry 10/03/2016 not physiologic  - 10/03/2016  After extensive coaching HFA effectiveness =    75% > try symbicort 80 2bid       Dyspnea on exertion    - spirometry 01/03/2016   FVC  3.91  10/03/2016  Walked RA x 3 laps @ 185 ft each  stopped due to  End of study, nl pace, no sob or desat   Min fatigue - Spirometry 10/03/2016  FVC  5.13  But f/v loop not physiologic  - Echo 10/22/2016  Features are consistent with a pseudonormal left ventricular filling pattern, with concomitant abnormal relaxation and increased filling pressure (grade 2 diastolic dysfunction). - Aortic valve: There was mild regurgitation. Valve area (VTI): 2.17 cm^2. - Mitral valve: Calcified annulus. There was mild regurgitation. - Left atrium: The atrium was moderately dilated. - Pulmonary arteries: PA peak pressure: 32 mm Hg (S). PFTs 12/31/16  wnl with FVC 3.46 (96%)          Allergies  Allergen Reactions  . Statins Hives    Immunization History  Administered Date(s) Administered  . Pneumococcal Conjugate-13 04/21/2014  . Pneumococcal Polysaccharide-23 07/08/2016  . Zoster 07/03/2015    Past Medical History:  Diagnosis Date  . Acute on chronic respiratory failure ([place]) 04/15/2015  . CAP (community acquired pneumonia) 04/13/2015   See cxr 04/13/2015 > admit [facility]    . H/O hiatal hernia   . Hypothyroidism 04/13/2015  . Myasthenia gravis ([place]) 04/13/2015  . Obesity 04/13/2015  . Paroxysmal A-fib ([place]) 04/16/2015  .  Sepsis ([place]) 04/15/2015    Tobacco History: Social History   Tobacco Use  Smoking Status Former Smoker  . Packs/day: 1.00  . Years: 30.00  . Pack years: 30.00  . Types: Cigarettes  . Last attempt to quit: 01/10/1982  . Years since quitting: 36.6  Smokeless Tobacco Never Used   Counseling given: Yes   Outpatient Encounter Medications as of 08/12/2018  Medication Sig  . levothyroxine (SYNTHROID, LEVOTHROID) 100 MCG tablet TK 1 T PO ONCE A DAY  . OVER THE COUNTER MEDICATION Take 10,000 mg by mouth daily. Vitamin C  . predniSONE (DELTASONE) 5 MG tablet Take 1 tablet (5 mg total) by mouth daily with breakfast. Take extra tablet as needed  . pyridostigmine (MESTINON) 60 MG tablet Take 1 tablet (60 mg total) by mouth 3  (three) times daily.  . predniSONE (DELTASONE) 10 MG tablet 2 tabs for 5 days (20mg  daily), then 1 tab for 5 days (10mg  daily), then resume 5 mg dailyTake in the AM with food.   No facility-administered encounter medications on file as of 08/12/2018.      Review of Systems  Review of Systems  Constitutional: Positive for activity change and fatigue. Negative for appetite change and fever.  HENT: Negative for congestion, postnasal drip, sinus pressure, sinus pain and sore throat.   Respiratory: Positive for chest tightness and shortness of breath. Negative for cough.   Cardiovascular: Negative for chest pain, palpitations and leg swelling.  Gastrointestinal: Negative for diarrhea, nausea and vomiting.  Genitourinary: Negative for dysuria.  Neurological: Negative for dizziness, light-headedness and headaches.  Psychiatric/Behavioral: Negative for dysphoric mood. The patient is not nervous/anxious.   All other systems reviewed and are negative.    Physical Exam  BP 130/84 (BP Location: Left Arm, Cuff Size: Normal)   Pulse 85   Temp 97.7 F (36.5 C) (Oral)   Ht 5\' 8"  (1.727 m)   Wt 165 lb (74.8 kg)   SpO2 99%   BMI 25.09 kg/m   Wt Readings from Last 5 Encounters:  08/12/18 165 lb (74.8 kg)  05/19/18 163 lb (73.9 kg)  02/12/18 156 lb (70.8 kg)  06/17/17 158 lb 5 oz (71.8 kg)  03/22/17 162 lb (73.5 kg)    Physical Exam  Constitutional: He is oriented to person, place, and time and well-developed, well-nourished, and in no distress. No distress.  HENT:  Head: Normocephalic and atraumatic.  Right Ear: Hearing, tympanic membrane, external ear and ear canal normal.  Left Ear: Hearing, tympanic membrane, external ear and ear canal normal.  Nose: Nose normal. Right sinus exhibits no maxillary sinus tenderness and no frontal sinus tenderness. Left sinus exhibits no maxillary sinus tenderness and no frontal sinus tenderness.  Mouth/Throat: Uvula is midline and oropharynx is clear  and moist. No oropharyngeal exudate.  Eyes: Pupils are equal, round, and reactive to light.  Neck: Normal range of motion. Neck supple.  Cardiovascular: Normal rate, regular rhythm and normal heart sounds.  Pulmonary/Chest: Effort normal and breath sounds normal. No accessory muscle usage. No respiratory distress. He has no decreased breath sounds. He has no wheezes. He has no rhonchi.  Musculoskeletal: Normal range of motion. He exhibits no edema.  Lymphadenopathy:    He has no cervical adenopathy.  Neurological: He is alert and oriented to person, place, and time. Gait normal.  Skin: Skin is warm and dry. He is not diaphoretic. No erythema.  Psychiatric: Mood, memory, affect and judgment normal.  Nursing note and vitals reviewed.  Lab Results:  CBC    Component Value Date/Time   WBC 7.2 03/11/2017 1124   WBC 9.8 10/02/2016 0900   RBC 4.06 (L) 03/11/2017 1140   RBC 4.74 10/02/2016 0900   HGB 13.0 03/11/2017 1124   HCT 38.3 (L) 03/11/2017 1124   PLT 114 Platelet count consistent in citrate (L) 03/11/2017 1124   MCV 91.5 03/11/2017 1124   MCH 31.0 03/11/2017 1124   MCH 32.9 10/31/2016 1339   MCH 31.4 04/15/2015 0515   MCHC 33.9 03/11/2017 1124   MCHC 35.0 10/02/2016 0900   RDW 14.0 03/11/2017 1124   LYMPHSABS 1.2 03/11/2017 1124   MONOABS 0.7 03/11/2017 1124   EOSABS 0.2 03/11/2017 1124   EOSABS 0.0 10/31/2016 1339   BASOSABS 0.0 03/11/2017 1124    BMET    Component Value Date/Time   NA 140 10/03/2016 1204   K 4.8 10/03/2016 1204   CL 103 10/03/2016 1204   CO2 27 10/03/2016 1204   GLUCOSE 142 (H) 10/03/2016 1204   BUN 14 10/03/2016 1204   CREATININE 0.90 10/03/2016 1204   CALCIUM 9.0 10/03/2016 1204   GFRNONAA >60 04/15/2015 0515   GFRAA >60 04/15/2015 0515    BNP No results found for: BNP  ProBNP    Component Value Date/Time   PROBNP 318.0 (H) 10/03/2016 1204      Assessment & Plan:   Pleasant 84 year old male patient seen office today for acute  visit.  Will give patient increased prednisone taper which then he will resume 5 mg daily as prescribed.  Emphasized the importance the patient have close follow-up with our office if symptoms are not improving to present back.  Patient agreed.  Will start patient at 20 mg daily for 5 days then 10 mg daily for 5 days then resume 5 mg dose.  Patient to follow-up in 6 to 8 weeks with Dr. Melvyn Novas.  Patient to present sooner if symptoms are worsening.  Myasthenia gravis ([place]) ?Flare today   Prednisone 10mg  tablet  >>> 2 tabs for 5 days (20mg  daily), then 1 tab for 5 days (10mg  daily), then resume 5 mg daily >>>take with food  >>>take in the morning   If not improving please contact our office or present to ER   Follow-up with Dr. Melvyn Novas in the next 6 to 8 weeks or sooner if symptoms are not improving     This appointment was 26 minutes along with over 50% of that time direct face-to-face patient care, assessment, plan of care follow-up.   Lauraine Rinne, NP 08/12/2018

## 2018-08-12 NOTE — Telephone Encounter (Signed)
Patient called and wanted to make sure he received a call about this today.  Call dropped before I could get his call back number.

## 2018-08-12 NOTE — Assessment & Plan Note (Signed)
?  Flare today   Prednisone 10mg  tablet  >>> 2 tabs for 5 days (20mg  daily), then 1 tab for 5 days (10mg  daily), then resume 5 mg daily >>>take with food  >>>take in the morning   If not improving please contact our office or present to ER   Follow-up with Dr. Melvyn Novas in the next 6 to 8 weeks or sooner if symptoms are not improving

## 2018-08-13 ENCOUNTER — Other Ambulatory Visit: Payer: Self-pay | Admitting: Neurology

## 2018-08-13 NOTE — Progress Notes (Signed)
Chart and office note reviewed in detail  > agree with a/p as outlined    

## 2018-08-14 ENCOUNTER — Ambulatory Visit: Payer: Medicare Other | Admitting: Internal Medicine

## 2018-08-19 ENCOUNTER — Ambulatory Visit (INDEPENDENT_AMBULATORY_CARE_PROVIDER_SITE_OTHER): Payer: Medicare Other | Admitting: Internal Medicine

## 2018-08-19 ENCOUNTER — Encounter: Payer: Self-pay | Admitting: Internal Medicine

## 2018-08-19 VITALS — BP 138/78 | HR 75 | Ht 68.0 in | Wt 168.0 lb

## 2018-08-19 DIAGNOSIS — R0789 Other chest pain: Secondary | ICD-10-CM | POA: Diagnosis not present

## 2018-08-19 DIAGNOSIS — R0609 Other forms of dyspnea: Secondary | ICD-10-CM | POA: Diagnosis not present

## 2018-08-19 DIAGNOSIS — I482 Chronic atrial fibrillation, unspecified: Secondary | ICD-10-CM

## 2018-08-19 MED ORDER — PANTOPRAZOLE SODIUM 40 MG PO TBEC: DELAYED_RELEASE_TABLET | ORAL | 2 refills | Status: DC

## 2018-08-19 NOTE — Patient Instructions (Addendum)
protonix 40 mg Take 30- 60 min before your first and last meals of the day  (or pepcid 20 mg otc twice daily after meals, though this is not as effective as protonix)   Continue prednisone 5 mg daily   GERD (REFLUX)  is an extremely common cause of respiratory symptoms just like yours , many times with no obvious heartburn at all.    It can be treated with medication, but also with lifestyle changes including elevation of the head of your bed (ideally with 6 inch  bed blocks),  Smoking cessation, avoidance of late meals, excessive alcohol, and avoid fatty foods, chocolate, peppermint, colas, red wine, and acidic juices such as orange juice.  NO MINT OR MENTHOL PRODUCTS SO NO COUGH DROPS   USE SUGARLESS CANDY INSTEAD (Jolley ranchers or Stover's or Life Savers) or even ice chips will also do - the key is to swallow to prevent all throat clearing. NO OIL BASED VITAMINS - use powdered substitutes.    Please remember to go to the lab department downstairs in the basement  for your tests - we will call you with the results when they are available.      Please schedule a follow up office visit in 2 weeks, sooner if needed - go to ER if condition worsens

## 2018-08-19 NOTE — Progress Notes (Signed)
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    History of Present Illness  04/13/2015 1st [facility] office visit/ A[facility]il Sur   Chief Complaint  Patient presents with  . Pulmonary Consult    sob, self referral, tested for Whooping Cough by PCP, Returning from [place] 16 hr flight,MG x 3 years  followed by Dr Posey Pronto neurology  for [facility] and feels this is stable but acutely ill as above 2 d p jet travel initially with diarrhea resolved then  severe cough no vomiting > mucus is yellow and thick and hard to cough up, can no longer lie down in bed and sleep without cough and gasping for air. No better since zpak on 7/11 but no def fever at any pont.  Dx CAP  rec Admit [facility] > all better   Admit date: 04/13/2015 Discharge date: 04/17/2015   Recommendations for Outpatient Follow-Up:   1. F/U with PCP in 1 week to ensure resolution of symptoms. Consider repeat CXR in 3-4 weeks.   Discharge Diagnosis:   Principal Problem:   Sepsis secondary to CAP Active Problems:   CAP (community acquired pneumonia)   Myasthenia gravis   Hypothyroidism   Influenza A   Acute on chronic respiratory failure   Acute renal failure   Paroxysmal a-fib        12/15/2015 acute extended ov/Kenzy Campoverde re: cough /sob on prednisone 20 x one week  prior to [place] Complaint  Patient presents with  . Acute Visit    Pt c/o sob and cough x 3 days. Cough is prod with white sputum.   acute onset s travel this episode, comfortable at rest though more difficult in supine position due to sob/cough  last night first time this  happened since dx of CAP but comfortable at rest sitting. rec dulera 100 Take 2 puffs first thing in am and then another 2 puffs about 12 hours later.  For cough / congestion mucinex dm 1200 mg every 12 hours Levaquin 500mg  daily x 7 days   01/03/2016  f/u ov/Ephriam Turman re: s/p acute ab/ off dulera x one week s flare  Chief Complaint  Patient presents with  . Follow-up    Breathing has improved back to his normal baseline. No new co's today.   Not limited by breathing from desired activities   rec Keep the dulera 100 sample on hand for recurrent cough or wheeze or short of breath and call me if start needing it for follow up appt Work on maintaining perfect inhaler technique:  relax and gently blow all the way out then take a nice smooth deep breath back in, triggering the inhaler at same time you start breathing in.  Hold for up to 5 seconds if you can. Blow out thru nose. Rinse and gargle with water when done Keep track of your inspiratory capacity on your incentive spirometer so you can communicate with your neurologist if you start noting a decline which would likely be related to MG     10/03/2016 acute extended ov/Javoni Lucken  re: sob "I think it's my MG/ Dr Posey Pronto does not Chief Complaint  Patient presents with  . Acute Visit    Pt c/o increased DOE for the past 3 wks. He states that he is getting winded and tired just walking from one room to the next. He also has been hoarse for the past few wks, but no coughing.   did not start the dulera/ not following  IC at home as recommended  Acute onset "like overnight" sob developed x 3 weeks   s cough but legs and arms weaker than usual Prior to onset was able to sleep almost flat  and since then 30 degrees s am exac of cough/ congestion/ dysphagia / choking No worse late in day vs early  In day  rec Symbicort 80 Take 2 puffs first thing in am and then another 2 puffs about 12 hours later only use it if you feel it helps Incentive spirometry should be  checked twice daily to track your inspiratory muscle strength   If condition is worsening and can't get breath at rest, go to ER      05/19/2018  f/u ov/Jennika Ringgold re:  MG / no longer on resp rx at all  Chief Complaint  Patient presents with  . Follow-up    He is doing well at this time no symptoms.  Dyspnea:  Walking up to a quarter mile nl pace /   able to work out a gym 6 x weekly just wts/ not sob Cough: none Sleeping: on side 30 degrees bunch of pillows  rec Incentive spirometry at least twice daily - set the leveler at the first effort and beat it on the next 3 efforts    NP eval 08/12/18 "chest burning" Prednisone 10mg  tablet  >>> 2 tabs for 5 days (20mg  daily), then 1 tab for 5 days (10mg  daily), then resume 5 mg daily If not improving please contact our office or present to ER     08/19/2018 acute extended ov/Paetyn Pietrzak re: chest burning  Chief Complaint  Patient presents with  . Acute Visit    Still has "burning in lungs"- notices after exertionand he finds it uncomfortable to lie down.  He also c/o hoarseness today.   onset 08/09/18 while exerting gen ant chest burning assoc with doe and breathing got better at rest  but burning continued, non radiating   s n or v or diaphoresis and described as constant =sitting / supine resolved completely p taking  Pm ativan 08/17/18 and when woke up  100% better am 08/18/18 and stayed better until am of ov then  woke up with burning pain not related to breathing but this time just isolated to L parasternal where previously was bilateral.  No assoc cough/ no pain brought on by cough or deep breath or walking or relation to meals.  No flare of weakness from MG, no better on prednisone rx.  No obvious day to day or daytime variability or assoc excess/ purulent sputum or mucus plugs or hemoptysis or  chest tightness, subjective wheeze or overt sinus or hb symptoms.   Sleeping ok p ativan without nocturnal   exacerbation  of respiratory  c/o's or need  for noct saba. Also denies any obvious fluctuation of symptoms with weather or environmental changes or other aggravating or alleviating factors except as outlined above   No unusual exposure hx or h/o childhood pna/ asthma or knowledge of premature birth.  Current Allergies, Complete Past Medical History, Past  Surgical History, Family History, and Social History were reviewed in [employer] record.  ROS  The following are not active complaints unless bolded Hoarseness, sore throat, dysphagia, dental problems, itching, sneezing,  nasal congestion or discharge of excess mucus or purulent secretions, ear ache,   fever, chills, sweats, unintended wt loss or wt gain, classically pleuritic or exertional cp, ?  Orthopnea better p ativan  pnd or arm/hand swelling  or leg swelling, presyncope, palpitations, abdominal pain, anorexia, nausea, vomiting, diarrhea  or change in bowel habits or change in bladder habits, change in stools or change in urine, dysuria, hematuria,  rash, arthralgias, visual complaints, headache, numbness, weakness or ataxia or problems with walking or coordination,  change in mood = anxious or  memory.        Current Meds  Medication Sig  . levothyroxine (SYNTHROID, LEVOTHROID) 100 MCG tablet TK 1 T PO ONCE A DAY  . OVER THE COUNTER MEDICATION Take 10,000 mg by mouth daily. Vitamin C  . pyridostigmine (MESTINON) 60 MG tablet TAKE 1 TABLET(60 MG) BY MOUTH THREE TIMES DAILY  .   predniSONE (DELTASONE) 10 MG tablet 2 tabs for 5 days (20mg  daily), then 1 tab for 5 days (10mg  daily), then resume 5 mg dailyTake in the AM with food.           Objective:   Physical Exam  Stoic amb wm nad   08/19/2018        168  05/19/2018          163  10/03/2016          185  01/03/2016          209   12/15/2015       206   04/13/15 210 lb 6.4 oz (95.437 kg)  06/13/12 205 lb 7.5 oz (93.2 kg)  06/11/12 202 lb (91.627 kg)    Vital signs reviewed - Note on arrival 02 sats  98%  on RA      HEENT: nl dentition, turbinates bilaterally, and oropharynx. Nl external ear canals without cough reflex   NECK :  without JVD/Nodes/TM/ nl carotid upstrokes bilaterally   LUNGS: no acc muscle use,  Nl contour chest which is clear to A and P bilaterally without cough on insp or exp maneuvers   CV:  RRR  no s3 or murmur or increase in P2, and no edema   ABD:  soft and nontender with nl inspiratory excursion in the supine position. No bruits or organomegaly appreciated, bowel sounds nl  MS:  Nl gait/ ext warm without deformities, calf tenderness, cyanosis or clubbing No obvious joint restrictions   SKIN: warm and dry without lesions    NEURO:  alert, approp, nl sensorium with  no motor or cerebellar deficits apparent.           08/19/2018   ekg  ? Slow afib/ RBBB no change vs priors      Labs ordered/ reviewed:      Chemistry      Component Value Date/Time   NA 137 08/20/2018 1412   K 4.0 08/20/2018 1412   CL 105 08/20/2018 1412   CO2 26 08/20/2018 1412   BUN 17 08/20/2018 1412   CREATININE 0.98 08/20/2018 1412      Component Value Date/Time   CALCIUM 8.4 (L) 08/20/2018 1412   ALKPHOS 80 09/17/2016 1540   AST 18 09/17/2016 1540   ALT 22 09/17/2016 1540   BILITOT 0.6 09/17/2016 1540  Lab Results  Component Value Date   WBC 10.1 08/20/2018   HGB 12.8 (L) 08/20/2018   HCT 42.1 08/20/2018   MCV 101.4 (H) 08/20/2018   PLT 113 (L) 08/20/2018          Lab Results  Component Value Date   PROBNP 1,659.0 (H) 08/20/2018       Lab Results  Component Value Date   ESRSEDRATE 13 08/20/2018         Labs ordered 08/19/2018   Troponin 0.82   Assessment:

## 2018-08-20 ENCOUNTER — Inpatient Hospital Stay (HOSPITAL_COMMUNITY)
Admission: EM | Admit: 2018-08-20 | Discharge: 2018-08-22 | DRG: 246 | Disposition: A | Discharge status: Home / Self Care | Payer: Medicare Other | Attending: Cardiovascular Disease | Admitting: Cardiovascular Disease

## 2018-08-20 ENCOUNTER — Encounter (HOSPITAL_COMMUNITY): Payer: Self-pay | Admitting: Emergency Medicine

## 2018-08-20 ENCOUNTER — Encounter: Payer: Self-pay | Admitting: Internal Medicine

## 2018-08-20 ENCOUNTER — Other Ambulatory Visit (INDEPENDENT_AMBULATORY_CARE_PROVIDER_SITE_OTHER): Payer: Medicare Other

## 2018-08-20 ENCOUNTER — Emergency Department (HOSPITAL_COMMUNITY): Payer: Medicare Other

## 2018-08-20 ENCOUNTER — Telehealth: Payer: Self-pay | Admitting: Internal Medicine

## 2018-08-20 ENCOUNTER — Other Ambulatory Visit: Payer: Self-pay

## 2018-08-20 DIAGNOSIS — I509 Heart failure, unspecified: Secondary | ICD-10-CM | POA: Insufficient documentation

## 2018-08-20 DIAGNOSIS — R0609 Other forms of dyspnea: Secondary | ICD-10-CM | POA: Diagnosis not present

## 2018-08-20 DIAGNOSIS — D696 Thrombocytopenia, unspecified: Secondary | ICD-10-CM | POA: Diagnosis present

## 2018-08-20 DIAGNOSIS — R079 Chest pain, unspecified: Secondary | ICD-10-CM

## 2018-08-20 DIAGNOSIS — I5043 Acute on chronic combined systolic (congestive) and diastolic (congestive) heart failure: Secondary | ICD-10-CM | POA: Diagnosis present

## 2018-08-20 DIAGNOSIS — I214 Non-ST elevation (NSTEMI) myocardial infarction: Secondary | ICD-10-CM | POA: Diagnosis not present

## 2018-08-20 DIAGNOSIS — G7000 Myasthenia gravis without (acute) exacerbation: Secondary | ICD-10-CM | POA: Diagnosis not present

## 2018-08-20 DIAGNOSIS — Z7952 Long term (current) use of systemic steroids: Secondary | ICD-10-CM

## 2018-08-20 DIAGNOSIS — R7989 Other specified abnormal findings of blood chemistry: Secondary | ICD-10-CM

## 2018-08-20 DIAGNOSIS — I480 Paroxysmal atrial fibrillation: Secondary | ICD-10-CM | POA: Diagnosis not present

## 2018-08-20 DIAGNOSIS — R778 Other specified abnormalities of plasma proteins: Secondary | ICD-10-CM

## 2018-08-20 DIAGNOSIS — I110 Hypertensive heart disease with heart failure: Secondary | ICD-10-CM | POA: Diagnosis present

## 2018-08-20 DIAGNOSIS — Z87891 Personal history of nicotine dependence: Secondary | ICD-10-CM

## 2018-08-20 DIAGNOSIS — I340 Nonrheumatic mitral (valve) insufficiency: Secondary | ICD-10-CM | POA: Diagnosis present

## 2018-08-20 DIAGNOSIS — E039 Hypothyroidism, unspecified: Secondary | ICD-10-CM | POA: Diagnosis present

## 2018-08-20 DIAGNOSIS — Z888 Allergy status to other drugs, medicaments and biological substances status: Secondary | ICD-10-CM

## 2018-08-20 DIAGNOSIS — R0789 Other chest pain: Secondary | ICD-10-CM | POA: Insufficient documentation

## 2018-08-20 DIAGNOSIS — Z79899 Other long term (current) drug therapy: Secondary | ICD-10-CM

## 2018-08-20 DIAGNOSIS — I10 Essential (primary) hypertension: Secondary | ICD-10-CM | POA: Diagnosis present

## 2018-08-20 DIAGNOSIS — I482 Chronic atrial fibrillation, unspecified: Secondary | ICD-10-CM | POA: Insufficient documentation

## 2018-08-20 DIAGNOSIS — Z955 Presence of coronary angioplasty implant and graft: Secondary | ICD-10-CM

## 2018-08-20 DIAGNOSIS — I2510 Atherosclerotic heart disease of native coronary artery without angina pectoris: Secondary | ICD-10-CM | POA: Diagnosis present

## 2018-08-20 DIAGNOSIS — E785 Hyperlipidemia, unspecified: Secondary | ICD-10-CM | POA: Diagnosis present

## 2018-08-20 DIAGNOSIS — Z95820 Peripheral vascular angioplasty status with implants and grafts: Secondary | ICD-10-CM

## 2018-08-20 DIAGNOSIS — E7800 Pure hypercholesterolemia, unspecified: Secondary | ICD-10-CM | POA: Diagnosis present

## 2018-08-20 HISTORY — DX: Acute on chronic combined systolic (congestive) and diastolic (congestive) heart failure: I50.43

## 2018-08-20 HISTORY — DX: Hyperlipidemia, unspecified: E78.5

## 2018-08-20 HISTORY — DX: Peripheral vascular angioplasty status with implants and grafts: Z95.820

## 2018-08-20 HISTORY — DX: Essential (primary) hypertension: I10

## 2018-08-20 HISTORY — DX: Chest pain, unspecified: R07.9

## 2018-08-20 HISTORY — DX: Atherosclerotic heart disease of native coronary artery without angina pectoris: I25.10

## 2018-08-20 LAB — I-STAT TROPONIN, ED: Troponin i, poc: 0.62 ng/mL (ref 0.00–0.08)

## 2018-08-20 LAB — CBC
HEMATOCRIT: 42.1 % (ref 39.0–52.0)
Hemoglobin: 12.8 g/dL — ABNORMAL LOW (ref 13.0–17.0)
MCH: 30.8 pg (ref 26.0–34.0)
MCHC: 30.4 g/dL (ref 30.0–36.0)
MCV: 101.4 fL — AB (ref 80.0–100.0)
Platelets: 113 10*3/uL — ABNORMAL LOW (ref 150–400)
RBC: 4.15 MIL/uL — ABNORMAL LOW (ref 4.22–5.81)
RDW: 14.8 % (ref 11.5–15.5)
WBC: 10.1 10*3/uL (ref 4.0–10.5)
nRBC: 0 % (ref 0.0–0.2)

## 2018-08-20 LAB — BRAIN NATRIURETIC PEPTIDE: Pro B Natriuretic peptide (BNP): 1659 pg/mL — ABNORMAL HIGH (ref 0.0–100.0)

## 2018-08-20 LAB — BASIC METABOLIC PANEL
Anion gap: 6 (ref 5–15)
BUN: 20 mg/dL (ref 6–23)
BUN: 17 mg/dL (ref 8–23)
CHLORIDE: 107 meq/L (ref 96–112)
CO2: 25 mEq/L (ref 19–32)
CO2: 26 mmol/L (ref 22–32)
CREATININE: 0.95 mg/dL (ref 0.40–1.50)
CREATININE: 0.98 mg/dL (ref 0.61–1.24)
Calcium: 8.6 mg/dL (ref 8.4–10.5)
Calcium: 8.4 mg/dL — ABNORMAL LOW (ref 8.9–10.3)
Chloride: 105 mmol/L (ref 98–111)
GFR calc Af Amer: >60 mL/min (ref >60)
GFR calc non Af Amer: >60 mL/min (ref >60)
GFR: 80.29 mL/min (ref >60.00)
GLUCOSE: 102 mg/dL — AB (ref 70–99)
GLUCOSE: 95 mg/dL (ref 70–99)
Potassium: 5.7 mEq/L — ABNORMAL HIGH (ref 3.5–5.1)
Potassium: 4 mmol/L (ref 3.5–5.1)
Sodium: 139 mEq/L (ref 135–145)
Sodium: 137 mmol/L (ref 135–145)

## 2018-08-20 LAB — CBC WITH DIFFERENTIAL/PLATELET
BASOS ABS: 0 10*3/uL (ref 0.0–0.1)
Basophils Relative: 0.3 % (ref 0.0–3.0)
EOS ABS: 0 10*3/uL (ref 0.0–0.7)
Eosinophils Relative: 0.1 % (ref 0.0–5.0)
HCT: 40.6 % (ref 39.0–52.0)
Hemoglobin: 13.6 g/dL (ref 13.0–17.0)
LYMPHS ABS: 0.3 10*3/uL — AB (ref 0.7–4.0)
Lymphocytes Relative: 3.6 % — ABNORMAL LOW (ref 12.0–46.0)
MCHC: 33.4 g/dL (ref 30.0–36.0)
MCV: 96.7 fl (ref 78.0–100.0)
MONO ABS: 0.6 10*3/uL (ref 0.1–1.0)
MONOS PCT: 6.1 % (ref 3.0–12.0)
NEUTROS ABS: 8.1 10*3/uL — AB (ref 1.4–7.7)
NEUTROS PCT: 89.9 % — AB (ref 43.0–77.0)
PLATELETS: 118 10*3/uL — AB (ref 150.0–400.0)
RBC: 4.2 Mil/uL — AB (ref 4.22–5.81)
RDW: 14.9 % (ref 11.5–15.5)
WBC: 9 10*3/uL (ref 4.0–10.5)

## 2018-08-20 LAB — TROPONIN I
TNIDX: 0.82 ug/l — ABNORMAL HIGH (ref 0.00–0.06)
TROPONIN I: 0.78 ng/mL — AB (ref <0.03)
TROPONIN I: 0.84 ng/mL — AB (ref <0.03)

## 2018-08-20 LAB — SEDIMENTATION RATE: Sed Rate: 13 mm/hr (ref 0–20)

## 2018-08-20 MED ORDER — PYRIDOSTIGMINE BROMIDE 60 MG PO TABS
60.0000 mg | ORAL_TABLET | Freq: Every day | ORAL | Status: DC
  Administered 2018-08-21 – 2018-08-22 (×2): 60 mg via ORAL
  Filled 2018-08-20 (×2): qty 1

## 2018-08-20 MED ORDER — HEPARIN BOLUS VIA INFUSION
4000.0000 [IU] | Freq: Once | INTRAVENOUS | Status: AC
  Administered 2018-08-20: 4000 [IU] via INTRAVENOUS
  Filled 2018-08-20: qty 4000

## 2018-08-20 MED ORDER — LEVOTHYROXINE SODIUM 100 MCG PO TABS
100.0000 ug | ORAL_TABLET | Freq: Every day | ORAL | Status: DC
  Administered 2018-08-21 – 2018-08-22 (×2): 100 ug via ORAL
  Filled 2018-08-20 (×2): qty 1

## 2018-08-20 MED ORDER — ASPIRIN EC 81 MG PO TBEC
81.0000 mg | DELAYED_RELEASE_TABLET | Freq: Every day | ORAL | Status: DC
  Administered 2018-08-21 – 2018-08-22 (×2): 81 mg via ORAL
  Filled 2018-08-20 (×2): qty 1

## 2018-08-20 MED ORDER — ACETAMINOPHEN 325 MG PO TABS: 650.0000 mg | ORAL_TABLET | ORAL | Status: DC | PRN

## 2018-08-20 MED ORDER — FUROSEMIDE 10 MG/ML IJ SOLN
40.0000 mg | Freq: Two times a day (BID) | INTRAMUSCULAR | Status: DC
  Administered 2018-08-21 – 2018-08-22 (×2): 40 mg via INTRAVENOUS
  Filled 2018-08-20 (×2): qty 4

## 2018-08-20 MED ORDER — PANTOPRAZOLE SODIUM 40 MG PO TBEC: 40.0000 mg | DELAYED_RELEASE_TABLET | Freq: Every day | ORAL | Status: DC

## 2018-08-20 MED ORDER — ONDANSETRON HCL 4 MG/2ML IJ SOLN: 4.0000 mg | Freq: Four times a day (QID) | INTRAMUSCULAR | Status: DC | PRN

## 2018-08-20 MED ORDER — HEPARIN (PORCINE) 25000 UT/250ML-% IV SOLN
1100.0000 [IU]/h | INTRAVENOUS | Status: DC
  Administered 2018-08-20: 900 [IU]/h via INTRAVENOUS
  Filled 2018-08-20: qty 250

## 2018-08-20 MED ORDER — NITROGLYCERIN 0.4 MG SL SUBL: 0.4000 mg | SUBLINGUAL_TABLET | SUBLINGUAL | Status: DC | PRN

## 2018-08-20 MED ORDER — ASPIRIN 81 MG PO CHEW
324.0000 mg | CHEWABLE_TABLET | Freq: Once | ORAL | Status: AC
  Administered 2018-08-20: 324 mg via ORAL
  Filled 2018-08-20: qty 4

## 2018-08-20 MED ORDER — PREDNISONE 5 MG PO TABS
5.0000 mg | ORAL_TABLET | Freq: Every day | ORAL | Status: DC
  Administered 2018-08-21 – 2018-08-22 (×2): 5 mg via ORAL
  Filled 2018-08-20 (×2): qty 1

## 2018-08-20 NOTE — Assessment & Plan Note (Addendum)
Burning cp onset 06/19/18 >  rx for gerd 08/19/2018   No evidence at all of a pulmonary  source so ddx is between ihd/  mscp, gerd or anxiety related   Try max gerd rx with  F/u cards for afib planned   >>> troponin Pos so referred to ER for immediate cards eval.

## 2018-08-20 NOTE — ED Notes (Signed)
Patient transported to X-ray 

## 2018-08-20 NOTE — ED Provider Notes (Signed)
[facility] EMERGENCY DEPARTMENT Provider Note   CSN: [id number] Arrival date & time: 08/20/18  1345     History   Chief Complaint Chief Complaint  Patient presents with  . Chest Pain    HPI Marcus A Corrie Sr. is a 84 y.o. male.  83yo M w/ PMH including myasthenia gravis, paroxysmal atrial fibrillation who presents with chest pain.  1 week ago after exerting himself, he began having a burning pain across his chest that he states was "nagging."  He states that since it began, it was constant and did not change with exertion.  No associated shortness of breath, nausea, vomiting, or diaphoresis.  The pain resolved spontaneously 2 days ago but then came back yesterday morning.  It is currently very mild and only central rather than across his chest.  He denies any fever, cough/cold symptoms, leg swelling, recent travel, or recent illness.  He saw his pulmonologist yesterday and after describing his symptoms they sent off some lab work.  He was called today to report to the ER due to elevated troponin.  He denies any lower extremity edema but has noticed a few pounds of weight gain recently.  No orthopnea.  The history is provided by the patient and the spouse.  Chest Pain      Past Medical History:  Diagnosis Date  . Acute on chronic respiratory failure ([place]) 04/15/2015  . CAP (community acquired pneumonia) 04/13/2015   See cxr 04/13/2015 > admit [facility]    . H/O hiatal hernia   . Hypothyroidism 04/13/2015  . Myasthenia gravis ([place]) 04/13/2015  . Obesity 04/13/2015  . Paroxysmal A-fib ([place]) 04/16/2015  . Sepsis ([place]) 04/15/2015    Patient Active Problem List   Diagnosis Date Noted  . Chronic a-fib 08/20/2018  . Chest pain, atypical 08/20/2018  . Thrombocytopenia ([place]) 10/07/2016  . Dyspnea on exertion 10/03/2016  . B12 deficiency 06/06/2016  . Acute asthmatic bronchitis 12/15/2015  . Paroxysmal A-fib ([place]) 04/16/2015  . Influenza A 04/15/2015  . Sepsis ([place])  04/15/2015  . Acute renal failure ([place]) 04/15/2015  . CAP (community acquired pneumonia) 04/13/2015  . Obesity 04/13/2015  . Myasthenia gravis ([place]) 04/13/2015  . Hypothyroidism 04/13/2015    Past Surgical History:  Procedure Laterality Date  . AMPUTATION  06/11/2012   Procedure: AMPUTATION DIGIT;  Surgeon: [name], MD;  Location: [facility];  Service: Orthopedics;  Laterality: Left;  revision of amputation  . HERNIA REPAIR  1994  . ORIF SHOULDER FRACTURE  06/13/2012   Procedure: OPEN REDUCTION INTERNAL FIXATION (ORIF) SHOULDER FRACTURE;  Surgeon: [name], MD;  Location: [facility];  Service: Orthopedics;  Laterality: Left;  LEFT SHOULDER OPEN GREATER TUBEROSITY ORIF  . SHOULDER CLOSED REDUCTION  06/11/2012   Procedure: CLOSED MANIPULATION SHOULDER;  Surgeon: [name], MD;  Location: [facility];  Service: Orthopedics;  Laterality: Left;        Home Medications    Prior to Admission medications   Medication Sig Start Date End Date Taking? Authorizing Provider  levothyroxine (SYNTHROID, LEVOTHROID) 100 MCG tablet TK 1 T PO ONCE A DAY 05/16/17   [provider]  OVER THE COUNTER MEDICATION Take 10,000 mg by mouth daily. Vitamin C    [provider]  pantoprazole (PROTONIX) 40 MG tablet Take 30- 60 min before your first and last meals of the day 08/19/18   Tanda Rockers, MD  predniSONE (DELTASONE) 5 MG tablet Take 1 tablet (5 mg total) by mouth  daily with breakfast. Take extra tablet as needed Patient not taking: Reported on 08/19/2018 02/18/18   Narda Amber K, DO  pyridostigmine (MESTINON) 60 MG tablet TAKE 1 TABLET(60 MG) BY MOUTH THREE TIMES DAILY 08/13/18   Alda Berthold, DO    Family History Family History  Problem Relation Age of Onset  . Other Mother        Deceased, 40  . Heart attack Father        Deceased, 28  . Healthy Sister   . Healthy Daughter        x 4  . Healthy Son        x 3    Social History Social History   Tobacco Use  .  Smoking status: Former Smoker    Packs/day: 1.00    Years: 30.00    Pack years: 30.00    Types: Cigarettes    Last attempt to quit: 01/10/1982    Years since quitting: 36.6  . Smokeless tobacco: Never Used  Substance Use Topics  . Alcohol use: Yes    Alcohol/week: 2.0 standard drinks    Types: 2 Glasses of wine per week    Comment: 3-4 per week   . Drug use: No     Allergies   Beta adrenergic blockers and Statins   Review of Systems Review of Systems  Cardiovascular: Positive for chest pain.   All other systems reviewed and are negative except that which was mentioned in HPI   Physical Exam Updated Vital Signs BP 138/78   Pulse 65   Temp 97.8 F (36.6 C) (Oral)   Resp 19   Ht 5\' 8"  (1.727 m)   Wt 74.8 kg   SpO2 100%   BMI 25.09 kg/m   Physical Exam  Constitutional: He is oriented to person, place, and time. He appears well-developed and well-nourished. No distress.  HENT:  Head: Normocephalic and atraumatic.  Moist mucous membranes  Eyes: Conjunctivae are normal.  Neck: Neck supple.  Cardiovascular: Normal rate and regular rhythm.  Murmur heard. Pulmonary/Chest: Effort normal and breath sounds normal.  Abdominal: Soft. Bowel sounds are normal. He exhibits no distension. There is no tenderness.  Musculoskeletal: He exhibits no edema.  Neurological: He is alert and oriented to person, place, and time.  Fluent speech  Skin: Skin is warm and dry.  Psychiatric: He has a normal mood and affect. Judgment normal.  Nursing note and vitals reviewed.    ED Treatments / Results  Labs (all labs ordered are listed, but only abnormal results are displayed) Labs Reviewed  BASIC METABOLIC PANEL - Abnormal; Notable for the following components:      Result Value   Calcium 8.4 (*)    All other components within normal limits  CBC - Abnormal; Notable for the following components:   RBC 4.15 (*)    Hemoglobin 12.8 (*)    MCV 101.4 (*)    Platelets 113 (*)    All  other components within normal limits  TROPONIN I - Abnormal; Notable for the following components:   Troponin I 0.78 (*)    All other components within normal limits  I-STAT TROPONIN, ED - Abnormal; Notable for the following components:   Troponin i, poc 0.62 (*)    All other components within normal limits    EKG EKG Interpretation  Date/Time:  Wednesday August 20 2018 13:50:55 EST Ventricular Rate:  79 PR Interval:  172 QRS Duration: 146 QT Interval:  424 QTC Calculation: 486  R Axis:   -54 Text Interpretation:  Normal sinus rhythm Right bundle branch block Left anterior fascicular block ** Bifascicular block ** Minimal voltage criteria for LVH, may be normal variant Inferior infarct , age undetermined Abnormal ECG since previous tracing, now sinus rhythm, similar to old EKG in 2013 Confirmed by Theotis Burrow [phone]) on 08/20/2018 2:07:44 PM   Radiology Dg Chest 2 View  Result Date: 08/20/2018 CLINICAL DATA:  Chest pain for 1 week EXAM: CHEST - 2 VIEW COMPARISON:  02/12/2018 FINDINGS: Bilateral interstitial and alveolar airspace opacities primarily at the lung bases. Small bilateral pleural effusions. No pneumothorax. Stable cardiomegaly. No acute osseous abnormality. IMPRESSION: Small bilateral pleural effusions and bilateral interstitial and alveolar airspace opacities. Differential considerations include bilateral lower lobe pneumonia versus mild pulmonary edema. Electronically Signed   By: Kathreen Devoid   On: 08/20/2018 15:34    Procedures .Critical Care Performed by: Sharlett Iles, MD Authorized by: Sharlett Iles, MD   Critical care provider statement:    Critical care time (minutes):  30   Critical care time was exclusive of:  Separately billable procedures and treating other patients   Critical care was necessary to treat or prevent imminent or life-threatening deterioration of the following conditions:  Cardiac failure   Critical care was time spent  personally by me on the following activities:  Development of treatment plan with patient or surrogate, discussions with consultants, evaluation of patient's response to treatment, examination of patient, obtaining history from patient or surrogate, ordering and performing treatments and interventions, ordering and review of laboratory studies, ordering and review of radiographic studies and review of old charts   (including critical care time)  Medications Ordered in ED Medications  nitroGLYCERIN (NITROSTAT) SL tablet 0.4 mg (has no administration in time range)  aspirin chewable tablet 324 mg (324 mg Oral Given 08/20/18 1453)     Initial Impression / Assessment and Plan / ED Course  I have reviewed the triage vital signs and the nursing notes.  Pertinent labs & imaging results that were available during my care of the patient were reviewed by me and considered in my medical decision making (see chart for details).    Comfortable on exam, mildly hypertensive but remainder vital signs reassuring.  EKG without acute ischemic changes, similar to an old tracing in our system.  Gave aspirin and obtained labs.  Chart review shows troponin yesterday was 0.8 and BNP 1600.  Here trop is 0.78.  Chest x-ray with bilateral pleural effusions, likely edema as the patient denies any significant cough or infectious symptoms.  It is unclear whether he had an MI earlier in the week and now has some resultant heart failure or whether his elevated troponin is being driven by heart failure itself.  I consulted cardiology and they will see the patient here in the ED.  I anticipate admission.  Patient signed out pending cardiology evaluation. Final Clinical Impressions(s) / ED Diagnoses   Final diagnoses:  Chest pain, unspecified type  Elevated troponin    ED Discharge Orders    None       Nancie Bocanegra, Wenda Overland, MD 08/20/18 1556

## 2018-08-20 NOTE — Assessment & Plan Note (Addendum)
- spirometry 01/03/2016   FVC  3.91  10/03/2016  Walked RA x 3 laps @ 185 ft each stopped due to  End of study, nl pace, no sob or desat   Min fatigue - Spirometry 10/03/2016  FVC  5.13  But f/v loop not physiologic  - Echo 10/22/2016  Features are consistent with a pseudonormal left ventricular filling pattern, with concomitant abnormal relaxation and increased filling pressure (grade 2 diastolic dysfunction). - Aortic valve: There was mild regurgitation. Valve area (VTI): 2.17 cm^2. - Mitral valve: Calcified annulus. There was mild regurgitation. - Left atrium: The atrium was moderately dilated. - Pulmonary arteries: PA peak pressure: 32 mm Hg (S). PFTs 12/31/16  wnl with FVC 3.46 (96%)   - 08/19/2018  Walked RA x 2 laps @ 290ft each stopped due to  End of study, no sob/no cp/no desats   Symptoms are markedly disproportionate to objective findings and not clear to what extent this is actually a pulmonary  problem but pt does appear to have difficult to sort out respiratory symptoms of unknown origin for which  DDX  = almost all start with A and  include Adherence, Ace Inhibitors, Acid Reflux, Active Sinus Disease, Alpha 1 Antitripsin deficiency, Anxiety masquerading as Airways dz,  ABPA,  Allergy(esp in young), Aspiration (esp in elderly), Adverse effects of meds,  Active smoking or Vaping, A bunch of PE's/clot burden (a few small clots can't cause this syndrome unless there is already severe underlying pulm or vascular dz with poor reserve),  Anemia or thyroid disorder, plus two Bs  = Bronchiectasis and Beta blocker use..and one C= CHF     DDX of  difficult airways management almost all start with A and  include Adherence, Ace Inhibitors, Acid Reflux, Active Sinus Disease, Alpha 1 Antitripsin deficiency, Anxiety masquerading as Airways dz,  ABPA,  Allergy(esp in young), Aspiration (esp in elderly), Adverse effects of meds,  Active smoking or vaping, A bunch of PE's (a small clot burden can't  cause this syndrome unless there is already severe underlying pulm or vascular dz with poor reserve) plus two Bs  = Bronchiectasis and Beta blocker use..and one C= CHF   Adherence is always the initial "prime suspect" and is a multilayered concern that requires a "trust but verify" approach in every patient - starting with knowing how to use medications, especially inhalers, correctly, keeping up with refills and understanding the fundamental difference between maintenance and prns vs those medications only taken for a very short course and then stopped and not refilled.  - return with all meds in hand using a trust but verify approach to confirm accurate Medication  Reconciliation The principal here is that until we are certain that the  patients are doing what we've asked, it makes no sense to ask them to do more.    ? Acid (or non-acid) GERD > always difficult to exclude as up to 75% of pts in some series report no assoc GI/ Heartburn symptoms> rec max (24h)  acid suppression and diet restrictions/ reviewed and instructions given in writing.   ?allergy/asthma > doubt as did not respond to prednisone > rec resume previous dose floor = 5 mg daily   Anemia > ruled out today   ? Anxiety > usually at the bottom of this list of usual suspects but   may interfere with adherence and also interpretation of response or lack thereof to symptom management which can be quite subjective.   ? CHF suggested by elevated  bnp in settin gof atypical cp > to ER for prompt cards eval

## 2018-08-20 NOTE — Telephone Encounter (Signed)
MW not in office till 2pm. Spoke with Marcus Barrow, NP, and was advised pt will need to go to ED.  Called and spoke to pt. Informed him of the recs per [facility]. Pt reluctant to go to ED, informed pt the importance of him going to ED with this lab being elevated. Pt wrote down lab name and value and stated he would go to [facility] or [facility] ED. Pt was not having any SOB or CP. Will forward to MW as Jul[facility] [place].

## 2018-08-20 NOTE — ED Notes (Signed)
Patient transported to x-ray. ?

## 2018-08-20 NOTE — Telephone Encounter (Signed)
Attempted to call pt but had already left for ER where being eval by cardiology - suspect the burning cp may be ischemic/ ischemic CM  vs gerd with unrelated chf by cxr

## 2018-08-20 NOTE — Progress Notes (Signed)
ANTICOAGULATION CONSULT NOTE - Initial Consult  Pharmacy Consult for heparin Indication: chest pain/ACS  Allergies  Allergen Reactions  . Beta Adrenergic Blockers Hives  . Statins Hives    Patient Measurements: Height: 5\' 8"  (172.7 cm) Weight: 165 lb (74.8 kg) IBW/kg (Calculated) : 68.4 Heparin Dosing Weight: 75 kg  Vital Signs: Temp: 97.8 F (36.6 C) (11/20 1353) Temp Source: Oral (11/20 1353) BP: 142/78 (11/20 1730) Pulse Rate: 73 (11/20 1730)  Labs: Recent Labs    08/20/18 1159 08/20/18 1412  HGB 13.6 12.8*  HCT 40.6 42.1  PLT 118.0* 113*  CREATININE 0.95 0.98  TROPONINI  --  0.78*    Estimated Creatinine Clearance: 55.3 mL/min (by C-G formula based on SCr of 0.98 mg/dL).   Medical History: Past Medical History:  Diagnosis Date  . Acute on chronic respiratory failure ([place]) 04/15/2015  . CAP (community acquired pneumonia) 04/13/2015   See cxr 04/13/2015 > admit [facility]    . H/O hiatal hernia   . Hypothyroidism 04/13/2015  . Myasthenia gravis ([place]) 04/13/2015  . Obesity 04/13/2015  . Paroxysmal A-fib ([place]) 04/16/2015  . Sepsis ([place]) 04/15/2015   Assessment: 84 yo M presents with elevated troponin. Pharmacy consulted to start heparin. No anticoag PTA. Hgb 12.8, plts 113  Goal of Therapy:  Heparin level 0.3-0.7 units/ml Monitor platelets by anticoagulation protocol: Yes   Plan:  Give heparin 4,000 unit bolus Start heparin gtt at 900 units/hr Monitor daily heparin level, CBC, s/s of bleed  Marcus [name] J 08/20/2018,6:11 PM

## 2018-08-20 NOTE — H&P (Signed)
Cardiology Consultation:   Patient ID: Marcus Kaufmann Sr. MRN: [id number]; DOB: 05-20-1932  Admit date: 08/20/2018 Date of Consult: 08/20/2018  Primary Care Provider: [name], MD Primary Cardiologist: Skeet Latch, MD  - New Primary Electrophysiologist:  None    Patient Profile:   Marcus Kaufmann Sr. is a 84 y.o. male with a hx of thrombocytopenia, hyperlipidemia, myasthenia gravis and paroxysmal atrial fibrillation who is being seen today for the evaluation of chest pain and elevated troponin at the request of Dr. Rex Kras.  History of Present Illness:   Mr. Marcus [name] was seen in the office by Dr. Melvyn Novas yesterday for an acute visit with complaints of still burning in his lungs and finding it to be uncomfortable to lie down.  He had previously been seen in September with difficulty breathing with exertion.   The patient had an episode of atrial fibrillation with RVR felt to be triggered by pneumonia in 04/2015.  No documented recurrences.  A troponin level was obtained and was elevated so the patient was instructed to go to the hospital.  Today his BNP is elevated at 1659 and troponin level is elevated at 0.78. Chest x-ray shows small bilateral pleural effusions and bilateral interstitial and alveolar airspace opacities.  Bilateral lower lobe pneumonia versus mild pulmonary edema.  Mr. Melander tells me that on 11/8 he overexerted himself working outside in the cold. He had burning across the chest and shortness of breath. This improved when he went inside to rest. Then on Monday 11/11 he again noted burning across the chest that was constant for the next week, worse with exertion and better with rest. He also had shortness of breath and fatigue. On 11/12 he saw a PA at his pulmonary office at which time his prednisone was increased. He did not feel better so he made an appt to see Dr. Melvyn Novas yesterday, 11/19. His chest burning had actually resolved about 2 days ago and he has had no  further shortness of breath but is still feeling fatigued. At the office visit yesterday the patient was walked in the hall with no chest discomfort or shortness of breath. He has had no peripheral edema, orthopnea, PND, palpitations. He has had random lightheadedness over the last year that he attributes to his MG.   The patient does not smoke. He tells me that he is pretty active. He states that he does not want anything very intensive done. He states that he would not want a cardiac cath as he would not do open heart surgery if needed. He wants conservative therapy.    Past Medical History:  Diagnosis Date  . Acute on chronic respiratory failure ([place]) 04/15/2015  . CAP (community acquired pneumonia) 04/13/2015   See cxr 04/13/2015 > admit [facility]    . H/O hiatal hernia   . Hypothyroidism 04/13/2015  . Myasthenia gravis ([place]) 04/13/2015  . Obesity 04/13/2015  . Paroxysmal A-fib ([place]) 04/16/2015  . Sepsis ([place]) 04/15/2015    Past Surgical History:  Procedure Laterality Date  . AMPUTATION  06/11/2012   Procedure: AMPUTATION DIGIT;  Surgeon: [name], MD;  Location: [place];  Service: Orthopedics;  Laterality: Left;  revision of amputation  . HERNIA REPAIR  1994  . ORIF SHOULDER FRACTURE  06/13/2012   Procedure: OPEN REDUCTION INTERNAL FIXATION (ORIF) SHOULDER FRACTURE;  Surgeon: [name], MD;  Location: [place];  Service: Orthopedics;  Laterality: Left;  LEFT SHOULDER OPEN GREATER TUBEROSITY ORIF  . SHOULDER CLOSED REDUCTION  06/11/2012  Procedure: CLOSED MANIPULATION SHOULDER;  Surgeon: [name], MD;  Location: [place];  Service: Orthopedics;  Laterality: Left;     Home Medications:  Prior to Admission medications   Medication Sig Start Date End Date Taking? Authorizing Provider  levothyroxine (SYNTHROID, LEVOTHROID) 100 MCG tablet Take 100 mcg by mouth daily.  05/16/17  Yes [provider]  predniSONE (DELTASONE) 5 MG tablet Take 1 tablet (5 mg total) by mouth daily with  breakfast. Take extra tablet as needed 02/18/18  Yes Patel, Donika K, DO  pyridostigmine (MESTINON) 60 MG tablet TAKE 1 TABLET(60 MG) BY MOUTH THREE TIMES DAILY Patient taking differently: Take 60 mg by mouth daily.  08/13/18  Yes Patel, Donika K, DO  pantoprazole (PROTONIX) 40 MG tablet Take 30- 60 min before your first and last meals of the day Patient not taking: Reported on 08/20/2018 08/19/18   Tanda Rockers, MD    Inpatient Medications: Scheduled Meds:  Continuous Infusions:  PRN Meds: nitroGLYCERIN  Allergies:    Allergies  Allergen Reactions  . Beta Adrenergic Blockers Hives  . Statins Hives    Social History:   Social History   Socioeconomic History  . Marital status: Single    Spouse name: Not on file  . Number of children: 7  . Years of education: Not on file  . Highest education level: Not on file  Occupational History  . Not on file  Social Needs  . Financial resource strain: Not on file  . Food insecurity:    Worry: Not on file    Inability: Not on file  . Transportation needs:    Medical: Not on file    Non-medical: Not on file  Tobacco Use  . Smoking status: Former Smoker    Packs/day: 1.00    Years: 30.00    Pack years: 30.00    Types: Cigarettes    Last attempt to quit: 01/10/1982    Years since quitting: 36.6  . Smokeless tobacco: Never Used  Substance and Sexual Activity  . Alcohol use: Yes    Alcohol/week: 2.0 standard drinks    Types: 2 Glasses of wine per week    Comment: 3-4 per week   . Drug use: No  . Sexual activity: Never  Lifestyle  . Physical activity:    Days per week: Not on file    Minutes per session: Not on file  . Stress: Not on file  Relationships  . Social connections:    Talks on phone: Not on file    Gets together: Not on file    Attends religious service: Not on file    Active member of club or organization: Not on file    Attends meetings of clubs or organizations: Not on file    Relationship status: Not on  file  . Intimate partner violence:    Fear of current or ex partner: Not on file    Emotionally abused: Not on file    Physically abused: Not on file    Forced sexual activity: Not on file  Other Topics Concern  . Not on file  Social History Narrative   Lives alone in a two story home.  Divorced.  Has 7 children.     Retired from [employer].     Education: some college.    Family History:    Family History  Problem Relation Age of Onset  . Other Mother        Deceased, 26  .  Heart attack Father        Deceased, 38  . Healthy Sister   . Healthy Daughter        x 4  . Healthy Son        x 3     ROS:  Please see the history of present illness.   All other ROS reviewed and negative.     Physical Exam/Data:   Vitals:   08/20/18 1500 08/20/18 1530 08/20/18 1600 08/20/18 1630  BP: (!) 153/90 138/78 123/70 120/74  Pulse: 71 65 65 65  Resp: (!) 23 19 (!) 21 19  Temp:      TempSrc:      SpO2: 100% 100% 98% 98%  Weight:      Height:       No intake or output data in the 24 hours ending 08/20/18 1724 Filed Weights   08/20/18 1350  Weight: 74.8 kg   Body mass index is 25.09 kg/m.  General:  Well nourished, well developed, in no acute distress HEENT: normal Lymph: no adenopathy Neck: no JVD Endocrine:  No thryomegaly Vascular: No carotid bruits; FA pulses 2+ bilaterally without bruits  Cardiac:  normal S1, S2; RRR; 3/6 harsh systolic murmur at apex and left axilla Lungs:  clear to auscultation bilaterally, no wheezing, rhonchi or rales  Abd: soft, nontender, no hepatomegaly  Ext: no edema Musculoskeletal:  No deformities, BUE and BLE strength normal and equal Skin: warm and dry  Neuro:  CNs 2-12 intact, no focal abnormalities noted Psych:  Normal affect   EKG:  The EKG was personally reviewed and demonstrates:  Normal sinus rhythm, 79 bpm, RBBB, LAFB  Telemetry:  Telemetry was personally reviewed and demonstrates:  Sinus rhythm around 60  bpm.  Relevant CV Studies:  Echocardiogram 10/22/2016 Study Conclusions - Left ventricle: The cavity size was normal. There was moderate   concentric hypertrophy. Systolic function was normal. The   estimated ejection fraction was in the range of 55% to 60%. Wall   motion was normal; there were no regional wall motion   abnormalities. Features are consistent with a pseudonormal left   ventricular filling pattern, with concomitant abnormal relaxation   and increased filling pressure (grade 2 diastolic dysfunction). - Aortic valve: There was mild regurgitation. Valve area (VTI):   2.17 cm^2. - Mitral valve: Calcified annulus. There was mild regurgitation. - Left atrium: The atrium was moderately dilated. - Pulmonary arteries: PA peak pressure: 32 mm Hg (S).  Laboratory Data:  Chemistry Recent Labs  Lab 08/20/18 1159 08/20/18 1412  NA 139 137  K 5.7* 4.0  CL 107 105  CO2 25 26  GLUCOSE 102* 95  BUN 20 17  CREATININE 0.95 0.98  CALCIUM 8.6 8.4*  GFRNONAA  --  >60  GFRAA  --  >60  ANIONGAP  --  6    No results for input(s): PROT, ALBUMIN, AST, ALT, ALKPHOS, BILITOT in the last 168 hours. Hematology Recent Labs  Lab 08/20/18 1159 08/20/18 1412  WBC 9.0 10.1  RBC 4.20* 4.15*  HGB 13.6 12.8*  HCT 40.6 42.1  MCV 96.7 101.4*  MCH  --  30.8  MCHC 33.4 30.4  RDW 14.9 14.8  PLT 118.0* 113*   Cardiac Enzymes Recent Labs  Lab 08/20/18 1412  TROPONINI 0.78*    Recent Labs  Lab 08/20/18 1421  TROPIPOC 0.62*    BNP Recent Labs  Lab 08/20/18 1159  PROBNP 1,659.0*    DDimer No results for input(s): DDIMER in  the last 168 hours.  Radiology/Studies:  Dg Chest 2 View  Result Date: 08/20/2018 CLINICAL DATA:  Chest pain for 1 week EXAM: CHEST - 2 VIEW COMPARISON:  02/12/2018 FINDINGS: Bilateral interstitial and alveolar airspace opacities primarily at the lung bases. Small bilateral pleural effusions. No pneumothorax. Stable cardiomegaly. No acute osseous  abnormality. IMPRESSION: Small bilateral pleural effusions and bilateral interstitial and alveolar airspace opacities. Differential considerations include bilateral lower lobe pneumonia versus mild pulmonary edema. Electronically Signed   By: Kathreen Devoid   On: 08/20/2018 15:34    Assessment and Plan:   Chest pain -Pt with a few weeks of chest burning and shortness of breath. Concerning for possible angina. No chest discomfort or shortness of breath in the last 2 days, still feels unusually fatigued -Troponins 0.78, 0.62 (POC) -No previous cardiac history or ischemic evaluations. Has no significant CVD risk factors- no HTN, lipids not bad, no DM -Continue to trend troponins. -Will start heparin -Elevated troponins may be related to myocardial ischemia vs CHF vs demand ischemia with poss PNA (not likely as afebrile, no leukocytosis, no cough) -NPO after MN for possible ischemic testing in the am depending on troponins and symptoms. (Patient states would not want cath, will have Dr. Oval Linsey speak with him) Renal function is normal, could consider cardiac CT. -Will recheck echo for LV function, wall motion and valve status  Acute on chronic diastolic CHF -Echocardiogram 10/2016 showed moderate concentric LVH, EF 93-79%, grade 2 diastolic dysfunction, mild MR -BNP is 1659 -Chest x-ray shows small bilateral pleural effusions and bilateral interstitial and alveolar airspace opacities.  Most likely pulmonary edema -Pt has had no peripheral edema, orthopnea or PND. No JVD and lungs are clear.  -Unclear etiology. Possibly related to myocardial ischemia vs worsened valve disease vs unknown. -Diurese gently with lasix 40 mg IV BID -Monitor renal function with diuresis.  -Strict I&O, daily wts. -Recheck echo. If EF decreased would need cardiac cath to evaluate ischemic cause. Consider adding an ARB if systolic dysfunction. HR 60, not likely to tolerated BB- no BB with MG.   Mitral regurgitation -Mild  on echo in 10/2016.  -3/6 mitral murmur auscultated.   Hyperlipidemia -Not currently treated -Last LDL in epic/care everywhere was 102 in 10/2016  Paroxysmal atrial fibrillation -Single episode of A. fib with RVR during an episode of pneumonia in 04/2015 -Currently in sinus rhythm. Observe on tele  Myasthenia gravis -On daily prednisone since 2013  Hypothyroidism -Treated by primary care on levothyroxine     For questions or updates, please contact [facility] Please consult [email or web address] for contact info under     Signed, Daune Perch, NP  08/20/2018 5:24 PM

## 2018-08-20 NOTE — Assessment & Plan Note (Addendum)
See ekg 04/14/15 >  Repeat 08/19/2018 slow afib > referred to cards 08/19/2018   No immediate concern as afib is well controlled and no evidence of ischemia or chf    I had an extended discussion with the patient reviewing all relevant studies completed to date and  lasting 25 minutes of a 40  minute acute office  Visit addressing new  non-specific but potentially very serious refractory chest symptoms of uncertain and potentially multiple  Etiologies considered.   Each maintenance medication was reviewed in detail including most importantly the difference between maintenance and prns and under what circumstances the prns are to be triggered using an action plan format that is not reflected in the computer generated alphabetically organized AVS.    Please see AVS for specific instructions unique to this office visit that I personally wrote and verbalized to the the pt in detail and then reviewed with pt  by my nurse highlighting any changes in therapy/plan of care  recommended at today's visit.

## 2018-08-20 NOTE — ED Triage Notes (Signed)
C/o burning across chest x 1 week.  Denies sob, nausea, and vomiting.  Seen by pulmonologist yesterday and had EKG and Labs.  Received call today that Troponin was elevated.

## 2018-08-20 NOTE — Telephone Encounter (Signed)
Received call report from [place] with [facility] on patient's Triponin done on 11.19.19. Triponin level is 0.82 ng/ml.  Dr. Melvyn Novas, please advise, thank you.

## 2018-08-21 ENCOUNTER — Encounter (HOSPITAL_COMMUNITY)
Admission: EM | Disposition: A | Discharge status: Home / Self Care | Payer: Self-pay | Source: Home / Self Care | Attending: Cardiovascular Disease

## 2018-08-21 ENCOUNTER — Ambulatory Visit (HOSPITAL_COMMUNITY): Payer: Medicare Other

## 2018-08-21 DIAGNOSIS — I10 Essential (primary) hypertension: Secondary | ICD-10-CM | POA: Diagnosis not present

## 2018-08-21 DIAGNOSIS — Z888 Allergy status to other drugs, medicaments and biological substances status: Secondary | ICD-10-CM | POA: Diagnosis not present

## 2018-08-21 DIAGNOSIS — Z87891 Personal history of nicotine dependence: Secondary | ICD-10-CM | POA: Diagnosis not present

## 2018-08-21 DIAGNOSIS — I2510 Atherosclerotic heart disease of native coronary artery without angina pectoris: Secondary | ICD-10-CM

## 2018-08-21 DIAGNOSIS — I340 Nonrheumatic mitral (valve) insufficiency: Secondary | ICD-10-CM

## 2018-08-21 DIAGNOSIS — E785 Hyperlipidemia, unspecified: Secondary | ICD-10-CM | POA: Diagnosis not present

## 2018-08-21 DIAGNOSIS — E039 Hypothyroidism, unspecified: Secondary | ICD-10-CM | POA: Diagnosis not present

## 2018-08-21 DIAGNOSIS — G7000 Myasthenia gravis without (acute) exacerbation: Secondary | ICD-10-CM | POA: Diagnosis not present

## 2018-08-21 DIAGNOSIS — E7800 Pure hypercholesterolemia, unspecified: Secondary | ICD-10-CM | POA: Diagnosis not present

## 2018-08-21 DIAGNOSIS — I214 Non-ST elevation (NSTEMI) myocardial infarction: Secondary | ICD-10-CM | POA: Diagnosis not present

## 2018-08-21 DIAGNOSIS — Z79899 Other long term (current) drug therapy: Secondary | ICD-10-CM | POA: Diagnosis not present

## 2018-08-21 DIAGNOSIS — I5041 Acute combined systolic (congestive) and diastolic (congestive) heart failure: Secondary | ICD-10-CM | POA: Diagnosis not present

## 2018-08-21 DIAGNOSIS — D696 Thrombocytopenia, unspecified: Secondary | ICD-10-CM | POA: Diagnosis not present

## 2018-08-21 DIAGNOSIS — I480 Paroxysmal atrial fibrillation: Secondary | ICD-10-CM | POA: Diagnosis not present

## 2018-08-21 DIAGNOSIS — I110 Hypertensive heart disease with heart failure: Secondary | ICD-10-CM | POA: Diagnosis not present

## 2018-08-21 DIAGNOSIS — Z7952 Long term (current) use of systemic steroids: Secondary | ICD-10-CM | POA: Diagnosis not present

## 2018-08-21 DIAGNOSIS — I5043 Acute on chronic combined systolic (congestive) and diastolic (congestive) heart failure: Secondary | ICD-10-CM | POA: Diagnosis not present

## 2018-08-21 HISTORY — PX: LEFT HEART CATH AND CORONARY ANGIOGRAPHY: CATH118249

## 2018-08-21 HISTORY — PX: CORONARY STENT INTERVENTION: CATH118234

## 2018-08-21 LAB — BASIC METABOLIC PANEL
Anion gap: 5 (ref 5–15)
BUN: 18 mg/dL (ref 8–23)
CALCIUM: 8.1 mg/dL — AB (ref 8.9–10.3)
CO2: 22 mmol/L (ref 22–32)
CREATININE: 1.06 mg/dL (ref 0.61–1.24)
Chloride: 111 mmol/L (ref 98–111)
GFR calc non Af Amer: >60 mL/min (ref >60)
GLUCOSE: 88 mg/dL (ref 70–99)
Potassium: 5.2 mmol/L — ABNORMAL HIGH (ref 3.5–5.1)
Sodium: 138 mmol/L (ref 135–145)

## 2018-08-21 LAB — LIPID PANEL
CHOL/HDL RATIO: 2.8 ratio
Cholesterol: 168 mg/dL (ref 0–200)
HDL: 61 mg/dL (ref >40)
LDL CALC: 96 mg/dL (ref 0–99)
TRIGLYCERIDES: 56 mg/dL (ref <150)
VLDL: 11 mg/dL (ref 0–40)

## 2018-08-21 LAB — ECHOCARDIOGRAM COMPLETE
HEIGHTINCHES: 68 in
Weight: 2624 oz

## 2018-08-21 LAB — HEPARIN LEVEL (UNFRACTIONATED)
Heparin Unfractionated: 0.28 IU/mL — ABNORMAL LOW (ref 0.30–0.70)
Heparin Unfractionated: 0.3 IU/mL (ref 0.30–0.70)

## 2018-08-21 LAB — TROPONIN I
TROPONIN I: 0.87 ng/mL — AB (ref <0.03)
TROPONIN I: 0.9 ng/mL — AB (ref <0.03)

## 2018-08-21 LAB — POCT ACTIVATED CLOTTING TIME: ACTIVATED CLOTTING TIME: 423 s

## 2018-08-21 SURGERY — LEFT HEART CATH AND CORONARY ANGIOGRAPHY
Anesthesia: LOCAL

## 2018-08-21 MED ORDER — SODIUM CHLORIDE 0.9% FLUSH: 3.0000 mL | INTRAVENOUS | Status: DC | PRN

## 2018-08-21 MED ORDER — ANGIOPLASTY BOOK
Freq: Once | Status: AC
  Administered 2018-08-22
  Filled 2018-08-21: qty 1

## 2018-08-21 MED ORDER — LABETALOL HCL 5 MG/ML IV SOLN: 10.0000 mg | INTRAVENOUS | Status: AC | PRN

## 2018-08-21 MED ORDER — HEPARIN (PORCINE) IN NACL 1000-0.9 UT/500ML-% IV SOLN
INTRAVENOUS | Status: AC
  Filled 2018-08-21: qty 1000

## 2018-08-21 MED ORDER — LIDOCAINE HCL (PF) 1 % IJ SOLN
INTRAMUSCULAR | Status: DC | PRN
  Administered 2018-08-21: 2 mL

## 2018-08-21 MED ORDER — HEART ATTACK BOUNCING BOOK
Freq: Once | Status: AC
  Administered 2018-08-22
  Filled 2018-08-21: qty 1

## 2018-08-21 MED ORDER — VERAPAMIL HCL 2.5 MG/ML IV SOLN
INTRA_ARTERIAL | Status: DC | PRN
  Administered 2018-08-21 (×2): 5 mL via INTRA_ARTERIAL

## 2018-08-21 MED ORDER — ASPIRIN 81 MG PO CHEW: 81.0000 mg | CHEWABLE_TABLET | Freq: Every day | ORAL | Status: DC

## 2018-08-21 MED ORDER — FENTANYL CITRATE (PF) 100 MCG/2ML IJ SOLN
INTRAMUSCULAR | Status: AC
  Filled 2018-08-21: qty 2

## 2018-08-21 MED ORDER — LIDOCAINE HCL (PF) 1 % IJ SOLN
INTRAMUSCULAR | Status: AC
  Filled 2018-08-21: qty 30

## 2018-08-21 MED ORDER — BIVALIRUDIN TRIFLUOROACETATE 250 MG IV SOLR
INTRAVENOUS | Status: AC
  Filled 2018-08-21: qty 250

## 2018-08-21 MED ORDER — SODIUM CHLORIDE 0.9% FLUSH: 3.0000 mL | Freq: Two times a day (BID) | INTRAVENOUS | Status: DC

## 2018-08-21 MED ORDER — NITROGLYCERIN 1 MG/10 ML FOR IR/CATH LAB
INTRA_ARTERIAL | Status: AC
  Filled 2018-08-21: qty 10

## 2018-08-21 MED ORDER — ACETAMINOPHEN 325 MG PO TABS: 650.0000 mg | ORAL_TABLET | ORAL | Status: DC | PRN

## 2018-08-21 MED ORDER — TICAGRELOR 90 MG PO TABS
90.0000 mg | ORAL_TABLET | Freq: Two times a day (BID) | ORAL | Status: DC
  Administered 2018-08-22: 06:00:00 90 mg via ORAL
  Filled 2018-08-21 (×2): qty 1

## 2018-08-21 MED ORDER — MORPHINE SULFATE (PF) 2 MG/ML IV SOLN: 2.0000 mg | INTRAVENOUS | Status: DC | PRN

## 2018-08-21 MED ORDER — TICAGRELOR 90 MG PO TABS
ORAL_TABLET | ORAL | Status: DC | PRN
  Administered 2018-08-21: 180 mg via ORAL

## 2018-08-21 MED ORDER — NITROGLYCERIN 1 MG/10 ML FOR IR/CATH LAB
INTRA_ARTERIAL | Status: DC | PRN
  Administered 2018-08-21: 200 ug via INTRACORONARY

## 2018-08-21 MED ORDER — AMLODIPINE BESYLATE 5 MG PO TABS
2.5000 mg | ORAL_TABLET | Freq: Every day | ORAL | Status: DC
  Filled 2018-08-21: qty 1

## 2018-08-21 MED ORDER — SODIUM CHLORIDE 0.9 % IV SOLN: 250.0000 mL | INTRAVENOUS | Status: DC | PRN

## 2018-08-21 MED ORDER — ASPIRIN 81 MG PO CHEW: 81.0000 mg | CHEWABLE_TABLET | ORAL | Status: DC

## 2018-08-21 MED ORDER — SODIUM CHLORIDE 0.9 % IV SOLN
INTRAVENOUS | Status: AC
  Administered 2018-08-22: via INTRAVENOUS

## 2018-08-21 MED ORDER — HYDRALAZINE HCL 20 MG/ML IJ SOLN: 5.0000 mg | INTRAMUSCULAR | Status: AC | PRN

## 2018-08-21 MED ORDER — FENTANYL CITRATE (PF) 100 MCG/2ML IJ SOLN
INTRAMUSCULAR | Status: DC | PRN
  Administered 2018-08-21: 25 ug via INTRAVENOUS

## 2018-08-21 MED ORDER — SODIUM CHLORIDE 0.9 % WEIGHT BASED INFUSION: 1.0000 mL/kg/h | INTRAVENOUS | Status: DC

## 2018-08-21 MED ORDER — HEPARIN (PORCINE) IN NACL 1000-0.9 UT/500ML-% IV SOLN
INTRAVENOUS | Status: DC | PRN
  Administered 2018-08-21 (×3): 500 mL

## 2018-08-21 MED ORDER — HEPARIN SODIUM (PORCINE) 1000 UNIT/ML IJ SOLN
INTRAMUSCULAR | Status: DC | PRN
  Administered 2018-08-21: 3500 [IU] via INTRAVENOUS

## 2018-08-21 MED ORDER — SODIUM CHLORIDE 0.9 % IV SOLN
1.7500 mg/kg/h | INTRAVENOUS | Status: AC
  Administered 2018-08-21: 20:00:00 1.75 mg/kg/h via INTRAVENOUS
  Filled 2018-08-21: qty 250

## 2018-08-21 MED ORDER — TICAGRELOR 90 MG PO TABS
ORAL_TABLET | ORAL | Status: AC
  Filled 2018-08-21: qty 2

## 2018-08-21 MED ORDER — ONDANSETRON HCL 4 MG/2ML IJ SOLN: 4.0000 mg | Freq: Four times a day (QID) | INTRAMUSCULAR | Status: DC | PRN

## 2018-08-21 MED ORDER — MIDAZOLAM HCL 2 MG/2ML IJ SOLN
INTRAMUSCULAR | Status: DC | PRN
  Administered 2018-08-21: 1 mg via INTRAVENOUS

## 2018-08-21 MED ORDER — SODIUM CHLORIDE 0.9 % IV SOLN
INTRAVENOUS | Status: AC | PRN
  Administered 2018-08-21 (×2): 1.75 mg/kg/h via INTRAVENOUS

## 2018-08-21 MED ORDER — IOHEXOL 350 MG/ML SOLN
INTRAVENOUS | Status: DC | PRN
  Administered 2018-08-21: 280 mL via INTRACARDIAC

## 2018-08-21 MED ORDER — SODIUM CHLORIDE 0.9 % WEIGHT BASED INFUSION
3.0000 mL/kg/h | INTRAVENOUS | Status: DC
  Administered 2018-08-21: 3 mL/kg/h via INTRAVENOUS

## 2018-08-21 MED ORDER — MIDAZOLAM HCL 2 MG/2ML IJ SOLN
INTRAMUSCULAR | Status: AC
  Filled 2018-08-21: qty 2

## 2018-08-21 MED ORDER — BIVALIRUDIN BOLUS VIA INFUSION - CUPID
INTRAVENOUS | Status: DC | PRN
  Administered 2018-08-21: 55.8 mg via INTRAVENOUS

## 2018-08-21 MED ORDER — VERAPAMIL HCL 2.5 MG/ML IV SOLN
INTRAVENOUS | Status: AC
  Filled 2018-08-21: qty 2

## 2018-08-21 MED ORDER — MELATONIN 3 MG PO TABS
3.0000 mg | ORAL_TABLET | Freq: Once | ORAL | Status: AC
  Administered 2018-08-22: 3 mg via ORAL
  Filled 2018-08-21: qty 1

## 2018-08-21 SURGICAL SUPPLY — 23 items
BALLN SAPPHIRE 1.5X12 (BALLOONS) ×2
BALLN SAPPHIRE 2.0X12 (BALLOONS) ×2
BALLN SAPPHIRE ~~LOC~~ 3.25X12 (BALLOONS) ×2 IMPLANT
BALLOON SAPPHIRE 1.5X12 (BALLOONS) ×1 IMPLANT
BALLOON SAPPHIRE 2.0X12 (BALLOONS) ×1 IMPLANT
CATH INFINITI 5FR ANG PIGTAIL (CATHETERS) ×2 IMPLANT
CATH LAUNCHER 6FR JR4 (CATHETERS) ×2 IMPLANT
CATH OPTITORQUE TIG 4.0 5F (CATHETERS) ×2 IMPLANT
CATH VISTA GUIDE 6FR XBLAD4 (CATHETERS) ×2 IMPLANT
DEVICE RAD COMP TR BAND LRG (VASCULAR PRODUCTS) ×2 IMPLANT
GLIDESHEATH SLEND A-KIT 6F 22G (SHEATH) ×2 IMPLANT
GUIDEWIRE INQWIRE 1.5J.035X260 (WIRE) ×1 IMPLANT
INQWIRE 1.5J .035X260CM (WIRE) ×2
KIT ENCORE 26 ADVANTAGE (KITS) ×2 IMPLANT
KIT HEART LEFT (KITS) ×2 IMPLANT
PACK CARDIAC CATHETERIZATION (CUSTOM PROCEDURE TRAY) ×2 IMPLANT
STENT SYNERGY DES 3X16 (Permanent Stent) ×2 IMPLANT
SYR MEDRAD MARK 7 150ML (SYRINGE) ×2 IMPLANT
TRANSDUCER W/STOPCOCK (MISCELLANEOUS) ×2 IMPLANT
TUBING CIL FLEX 10 FLL-RA (TUBING) ×2 IMPLANT
WIRE ASAHI PROWATER 180CM (WIRE) ×2 IMPLANT
WIRE HI TORQ VERSACORE-J 145CM (WIRE) ×6 IMPLANT
WIRE HI TORQ WHISPER MS 190CM (WIRE) ×2 IMPLANT

## 2018-08-21 NOTE — Progress Notes (Signed)
ANTICOAGULATION CONSULT NOTE - Follow Up Consult  Pharmacy Consult for heparin Indication: chest pain/ACS  Labs: Recent Labs    08/20/18 1159 08/20/18 1412 08/20/18 1832 08/21/18 0310  HGB 13.6 12.8*  --   --   HCT 40.6 42.1  --   --   PLT 118.0* 113*  --   --   HEPARINUNFRC  --   --   --  0.28*  CREATININE 0.95 0.98  --   --   TROPONINI  --  0.78* 0.84*  --     Assessment: 85yo male subtherapeutic on heparin with initial dosing for CP; no gtt issues or signs of bleeding per RN.  Goal of Therapy:  Heparin level 0.3-0.7 units/ml   Plan:  Will increase heparin gtt by ~1 units/kg/hr to 1000 units/hr and check level with next scheduled lab draw.    Wynona Neat, PharmD, BCPS  08/21/2018,4:16 AM

## 2018-08-21 NOTE — Interval H&P Note (Signed)
Cath Lab Visit (complete for each Cath Lab visit)  Clinical Evaluation Leading to the Procedure:   ACS: Yes.    Non-ACS:    Anginal Classification: CCS III  Anti-ischemic medical therapy: No Therapy  Non-Invasive Test Results: No non-invasive testing performed  Prior CABG: No previous CABG      History and Physical Interval Note:  08/21/2018 3:27 PM  Lisle A Wogan Sr.  has presented today for surgery, with the diagnosis of cp  The various methods of treatment have been discussed with the patient and family. After consideration of risks, benefits and other options for treatment, the patient has consented to  Procedure(s): LEFT HEART CATH AND CORONARY ANGIOGRAPHY (N/A) as a surgical intervention .  The patient's history has been reviewed, patient examined, no change in status, stable for surgery.  I have reviewed the patient's chart and labs.  Questions were answered to the patient's satisfaction.     Quay Burow

## 2018-08-21 NOTE — Progress Notes (Signed)
ANTICOAGULATION CONSULT NOTE - Initial Consult  Pharmacy Consult for heparin Indication: chest pain/ACS  Allergies  Allergen Reactions  . Beta Adrenergic Blockers Hives  . Statins Hives    Patient Measurements: Height: 5\' 8"  (172.7 cm) Weight: 164 lb (74.4 kg) IBW/kg (Calculated) : 68.4 Heparin Dosing Weight: 75 kg  Vital Signs: Temp: 98.2 F (36.8 C) (11/21 0644) Temp Source: Oral (11/21 0644) BP: 158/100 (11/21 0644) Pulse Rate: 78 (11/21 0644)  Labs: Recent Labs    08/20/18 1159 08/20/18 1412 08/20/18 1832 08/21/18 0310 08/21/18 0313 08/21/18 0924  HGB 13.6 12.8*  --   --   --   --   HCT 40.6 42.1  --   --   --   --   PLT 118.0* 113*  --   --   --   --   HEPARINUNFRC  --   --   --  0.28*  --  0.30  CREATININE 0.95 0.98  --   --  1.06  --   TROPONINI  --  0.78* 0.84*  --  0.87*  --     Estimated Creatinine Clearance: 51.1 mL/min (by C-G formula based on SCr of 1.06 mg/dL).   Medical History: Past Medical History:  Diagnosis Date  . Acute on chronic respiratory failure ([place]) 04/15/2015  . CAP (community acquired pneumonia) 04/13/2015   See cxr 04/13/2015 > admit [facility]    . H/O hiatal hernia   . Hypothyroidism 04/13/2015  . Myasthenia gravis ([place]) 04/13/2015  . Obesity 04/13/2015  . Paroxysmal A-fib ([place]) 04/16/2015  . Sepsis ([place]) 04/15/2015   Assessment: 84 yo M presents with elevated troponin. Pharmacy consulted to start heparin. No anticoag PTA. Hgb 12.8, plts 113 (11/20), no bleeding reported today. Heparin level lower end of therapeutic s/p increase to 1000 units/hr  Goal of Therapy:  Heparin level 0.3-0.7 units/ml Monitor platelets by anticoagulation protocol: Yes   Plan:  Increase heparin gtt to 1100 units/hr F/u 8 hour level Daily heparin level, CBC, s/s bleeding  Bertis Ruddy, PharmD Clinical Pharmacist Please check AMION for all [place] numbers 08/21/2018 10:27 AM

## 2018-08-21 NOTE — H&P (View-Only) (Signed)
Progress Note  Patient Name: Marcus STERN Sr. Date of Encounter: 08/21/2018  Primary Cardiologist: Skeet Latch, MD   Subjective   Continues to have chest pain with ambulation   Inpatient Medications    Scheduled Meds: . aspirin EC  81 mg Oral Daily  . furosemide  40 mg Intravenous BID  . levothyroxine  100 mcg Oral QAC breakfast  . predniSONE  5 mg Oral Q breakfast  . pyridostigmine  60 mg Oral Daily   Continuous Infusions: . heparin 1,000 Units/hr (08/21/18 0423)   PRN Meds: acetaminophen, nitroGLYCERIN, ondansetron (ZOFRAN) IV   Vital Signs    Vitals:   08/20/18 1900 08/20/18 1930 08/20/18 2004 08/21/18 0644  BP: 129/69 127/71 (!) 147/92 (!) 158/100  Pulse: 62 65 79 78  Resp: 16 (!) 22    Temp:   98.2 F (36.8 C) 98.2 F (36.8 C)  TempSrc:   Oral Oral  SpO2: 96% 96% 100% 100%  Weight:   74.7 kg 74.4 kg  Height:   5\' 8"  (1.727 m)     Intake/Output Summary (Last 24 hours) at 08/21/2018 0935 Last data filed at 08/21/2018 0817 Gross per 24 hour  Intake 54.94 ml  Output -  Net 54.94 ml   Filed Weights   08/20/18 1350 08/20/18 2004 08/21/18 0644  Weight: 74.8 kg 74.7 kg 74.4 kg    Telemetry    Sinus rhythm.  Blocked PACs.  - Personally Reviewed  ECG    Sinus rhythm.  Rate 68 bpm.  RBBB.  PACs.  Blocked PACs- Personally Reviewed  Physical Exam   VS:  BP (!) 158/100 (BP Location: Right Arm) Comment: RN notified  Pulse 78   Temp 98.2 F (36.8 C) (Oral)   Resp (!) 22   Ht 5\' 8"  (1.727 m)   Wt 74.4 kg   SpO2 100%   BMI 24.94 kg/m  , BMI Body mass index is 24.94 kg/m. GENERAL:  Well appearing HEENT: Pupils equal round and reactive, fundi not visualized, oral mucosa unremarkable NECK:  No jugular venous distention, waveform within normal limits, carotid upstroke brisk and symmetric, no bruits, no thyromegaly LUNGS:  Mild bibasilar crackles. HEART:  RRR.  PMI not displaced or sustained,S1 and S2 within normal limits, no S3, no S4, no  clicks, no rubs, II/VI holostystolic murmur anteriorly and at the apex. ABD:  Flat, positive bowel sounds normal in frequency in pitch, no bruits, no rebound, no guarding, no midline pulsatile mass, no hepatomegaly, no splenomegaly EXT:  2 plus pulses throughout, no edema, no cyanosis no clubbing SKIN:  No rashes no nodules NEURO:  Cranial nerves II through XII grossly intact, motor grossly intact throughout [facility]:  Cognitively intact, oriented to person place and time  Labs    Chemistry Recent Labs  Lab 08/20/18 1159 08/20/18 1412 08/21/18 0313  NA 139 137 138  K 5.7* 4.0 5.2*  CL 107 105 111  CO2 25 26 22   GLUCOSE 102* 95 88  BUN 20 17 18   CREATININE 0.95 0.98 1.06  CALCIUM 8.6 8.4* 8.1*  GFRNONAA  --  >60 >60  GFRAA  --  >60 >60  ANIONGAP  --  6 5     Hematology Recent Labs  Lab 08/20/18 1159 08/20/18 1412  WBC 9.0 10.1  RBC 4.20* 4.15*  HGB 13.6 12.8*  HCT 40.6 42.1  MCV 96.7 101.4*  MCH  --  30.8  MCHC 33.4 30.4  RDW 14.9 14.8  PLT 118.0* 113*  Cardiac Enzymes Recent Labs  Lab 08/20/18 1412 08/20/18 1832 08/21/18 0313  TROPONINI 0.78* 0.84* 0.87*    Recent Labs  Lab 08/20/18 1421  TROPIPOC 0.62*     BNP Recent Labs  Lab 08/20/18 1159  PROBNP 1,659.0*     DDimer No results for input(s): DDIMER in the last 168 hours.   Radiology    Dg Chest 2 View  Result Date: 08/20/2018 CLINICAL DATA:  Chest pain for 1 week EXAM: CHEST - 2 VIEW COMPARISON:  02/12/2018 FINDINGS: Bilateral interstitial and alveolar airspace opacities primarily at the lung bases. Small bilateral pleural effusions. No pneumothorax. Stable cardiomegaly. No acute osseous abnormality. IMPRESSION: Small bilateral pleural effusions and bilateral interstitial and alveolar airspace opacities. Differential considerations include bilateral lower lobe pneumonia versus mild pulmonary edema. Electronically Signed   By: Kathreen Devoid   On: 08/20/2018 15:34    Cardiac Studies   Echo  pending  Patient Profile     Mr. Cuevas is an 60M with paroxysmal atrial fibrillation, myasthenia gravis, hyperlipidemia and thrombocytopenia here with NSTEMI.   Assessment & Plan    # NSTEMI: # Hyperlipidemia: Troponin continues to rise.  He reports exertional chest pain when he tries to go to the bathroom.  He is now more open to the idea of cardiac catheterization.  However, he would still like to wait and see the results of his echo first.  We will continue n.p.o. status.  Continue aspirin and heparin.  He is not on a beta blocker or statin due myasthenia gravis.   # Mitral regurgitation: Echo pending.  Volume status improving with diuresis.  # Acute heart failure type unknown: BNP was 1659 on admission.  Mr. [name] was mildly volume overloaded on admission.  Crackles have improved after IV lasix.  In/Outs are not recorded.  He reports good UOP and is breathing better.   # Paroxysmal atrial fibrillation: Occurred in the setting of pneumonia.  He is not on chronic anticoagulation.  # Myasthenia Gravis: No beta blocker as above.  Continue pyridostigmine.   # Hypertension: BP labile but mostly elevated.  May be situational.  Will start amlodipine 2.5mg  daily.   For questions or updates, please contact [place] Please consult [email or web address] for contact info under        Signed, Skeet Latch, MD  08/21/2018, 9:35 AM

## 2018-08-21 NOTE — Progress Notes (Signed)
Progress Note  Patient Name: Marcus SHUKLA Sr. Date of Encounter: 08/21/2018  Primary Cardiologist: Skeet Latch, MD   Subjective   Continues to have chest pain with ambulation   Inpatient Medications    Scheduled Meds: . aspirin EC  81 mg Oral Daily  . furosemide  40 mg Intravenous BID  . levothyroxine  100 mcg Oral QAC breakfast  . predniSONE  5 mg Oral Q breakfast  . pyridostigmine  60 mg Oral Daily   Continuous Infusions: . heparin 1,000 Units/hr (08/21/18 0423)   PRN Meds: acetaminophen, nitroGLYCERIN, ondansetron (ZOFRAN) IV   Vital Signs    Vitals:   08/20/18 1900 08/20/18 1930 08/20/18 2004 08/21/18 0644  BP: 129/69 127/71 (!) 147/92 (!) 158/100  Pulse: 62 65 79 78  Resp: 16 (!) 22    Temp:   98.2 F (36.8 C) 98.2 F (36.8 C)  TempSrc:   Oral Oral  SpO2: 96% 96% 100% 100%  Weight:   74.7 kg 74.4 kg  Height:   5\' 8"  (1.727 m)     Intake/Output Summary (Last 24 hours) at 08/21/2018 0935 Last data filed at 08/21/2018 0817 Gross per 24 hour  Intake 54.94 ml  Output -  Net 54.94 ml   Filed Weights   08/20/18 1350 08/20/18 2004 08/21/18 0644  Weight: 74.8 kg 74.7 kg 74.4 kg    Telemetry    Sinus rhythm.  Blocked PACs.  - Personally Reviewed  ECG    Sinus rhythm.  Rate 68 bpm.  RBBB.  PACs.  Blocked PACs- Personally Reviewed  Physical Exam   VS:  BP (!) 158/100 (BP Location: Right Arm) Comment: RN notified  Pulse 78   Temp 98.2 F (36.8 C) (Oral)   Resp (!) 22   Ht 5\' 8"  (1.727 m)   Wt 74.4 kg   SpO2 100%   BMI 24.94 kg/m  , BMI Body mass index is 24.94 kg/m. GENERAL:  Well appearing HEENT: Pupils equal round and reactive, fundi not visualized, oral mucosa unremarkable NECK:  No jugular venous distention, waveform within normal limits, carotid upstroke brisk and symmetric, no bruits, no thyromegaly LUNGS:  Mild bibasilar crackles. HEART:  RRR.  PMI not displaced or sustained,S1 and S2 within normal limits, no S3, no S4, no  clicks, no rubs, II/VI holostystolic murmur anteriorly and at the apex. ABD:  Flat, positive bowel sounds normal in frequency in pitch, no bruits, no rebound, no guarding, no midline pulsatile mass, no hepatomegaly, no splenomegaly EXT:  2 plus pulses throughout, no edema, no cyanosis no clubbing SKIN:  No rashes no nodules NEURO:  Cranial nerves II through XII grossly intact, motor grossly intact throughout [facility]:  Cognitively intact, oriented to person place and time  Labs    Chemistry Recent Labs  Lab 08/20/18 1159 08/20/18 1412 08/21/18 0313  NA 139 137 138  K 5.7* 4.0 5.2*  CL 107 105 111  CO2 25 26 22   GLUCOSE 102* 95 88  BUN 20 17 18   CREATININE 0.95 0.98 1.06  CALCIUM 8.6 8.4* 8.1*  GFRNONAA  --  >60 >60  GFRAA  --  >60 >60  ANIONGAP  --  6 5     Hematology Recent Labs  Lab 08/20/18 1159 08/20/18 1412  WBC 9.0 10.1  RBC 4.20* 4.15*  HGB 13.6 12.8*  HCT 40.6 42.1  MCV 96.7 101.4*  MCH  --  30.8  MCHC 33.4 30.4  RDW 14.9 14.8  PLT 118.0* 113*  Cardiac Enzymes Recent Labs  Lab 08/20/18 1412 08/20/18 1832 08/21/18 0313  TROPONINI 0.78* 0.84* 0.87*    Recent Labs  Lab 08/20/18 1421  TROPIPOC 0.62*     BNP Recent Labs  Lab 08/20/18 1159  PROBNP 1,659.0*     DDimer No results for input(s): DDIMER in the last 168 hours.   Radiology    Dg Chest 2 View  Result Date: 08/20/2018 CLINICAL DATA:  Chest pain for 1 week EXAM: CHEST - 2 VIEW COMPARISON:  02/12/2018 FINDINGS: Bilateral interstitial and alveolar airspace opacities primarily at the lung bases. Small bilateral pleural effusions. No pneumothorax. Stable cardiomegaly. No acute osseous abnormality. IMPRESSION: Small bilateral pleural effusions and bilateral interstitial and alveolar airspace opacities. Differential considerations include bilateral lower lobe pneumonia versus mild pulmonary edema. Electronically Signed   By: Kathreen Devoid   On: 08/20/2018 15:34    Cardiac Studies   Echo  pending  Patient Profile     Mr. Marcus [name] is an 18M with paroxysmal atrial fibrillation, myasthenia gravis, hyperlipidemia and thrombocytopenia here with NSTEMI.   Assessment & Plan    # NSTEMI: # Hyperlipidemia: Troponin continues to rise.  He reports exertional chest pain when he tries to go to the bathroom.  He is now more open to the idea of cardiac catheterization.  However, he would still like to wait and see the results of his echo first.  We will continue n.p.o. status.  Continue aspirin and heparin.  He is not on a beta blocker or statin due myasthenia gravis.   # Mitral regurgitation: Echo pending.  Volume status improving with diuresis.  # Acute heart failure type unknown: BNP was 1659 on admission.  Mr. Marcus [name] was mildly volume overloaded on admission.  Crackles have improved after IV lasix.  In/Outs are not recorded.  He reports good UOP and is breathing better.   # Paroxysmal atrial fibrillation: Occurred in the setting of pneumonia.  He is not on chronic anticoagulation.  # Myasthenia Gravis: No beta blocker as above.  Continue pyridostigmine.   # Hypertension: BP labile but mostly elevated.  May be situational.  Will start amlodipine 2.5mg  daily.   For questions or updates, please contact [place] Please consult [email or web address] for contact info under        Signed, Skeet Latch, MD  08/21/2018, 9:35 AM

## 2018-08-21 NOTE — Progress Notes (Signed)
I talked to Marcus [name] and he would like to proceed with cardiac cath.  I reviewed the risks involved.  He has family here who agrees with his decision.   The patient understands that risks included but are not limited to stroke (1 in 1000), death (1 in 43), kidney failure [usually temporary] (1 in 500), bleeding (1 in 200), allergic reaction [possibly serious] (1 in 200).

## 2018-08-22 ENCOUNTER — Other Ambulatory Visit: Payer: Self-pay | Admitting: Cardiology

## 2018-08-22 ENCOUNTER — Encounter (HOSPITAL_COMMUNITY): Payer: Self-pay | Admitting: Cardiovascular Disease

## 2018-08-22 ENCOUNTER — Ambulatory Visit: Payer: Medicare Other | Admitting: Neurology

## 2018-08-22 ENCOUNTER — Telehealth: Payer: Self-pay | Admitting: Cardiology

## 2018-08-22 DIAGNOSIS — I10 Essential (primary) hypertension: Secondary | ICD-10-CM

## 2018-08-22 DIAGNOSIS — Z95820 Peripheral vascular angioplasty status with implants and grafts: Secondary | ICD-10-CM

## 2018-08-22 DIAGNOSIS — E7800 Pure hypercholesterolemia, unspecified: Secondary | ICD-10-CM | POA: Diagnosis present

## 2018-08-22 DIAGNOSIS — I5043 Acute on chronic combined systolic (congestive) and diastolic (congestive) heart failure: Secondary | ICD-10-CM

## 2018-08-22 DIAGNOSIS — Z79899 Other long term (current) drug therapy: Secondary | ICD-10-CM

## 2018-08-22 DIAGNOSIS — E785 Hyperlipidemia, unspecified: Secondary | ICD-10-CM

## 2018-08-22 DIAGNOSIS — I2510 Atherosclerotic heart disease of native coronary artery without angina pectoris: Secondary | ICD-10-CM | POA: Diagnosis present

## 2018-08-22 DIAGNOSIS — I5041 Acute combined systolic (congestive) and diastolic (congestive) heart failure: Secondary | ICD-10-CM

## 2018-08-22 HISTORY — DX: Acute on chronic combined systolic (congestive) and diastolic (congestive) heart failure: I50.43

## 2018-08-22 HISTORY — DX: Atherosclerotic heart disease of native coronary artery without angina pectoris: I25.10

## 2018-08-22 HISTORY — DX: Essential (primary) hypertension: I10

## 2018-08-22 HISTORY — DX: Peripheral vascular angioplasty status with implants and grafts: Z95.820

## 2018-08-22 HISTORY — DX: Hyperlipidemia, unspecified: E78.5

## 2018-08-22 LAB — CBC
HCT: 37.7 % — ABNORMAL LOW (ref 39.0–52.0)
HEMOGLOBIN: 12 g/dL — AB (ref 13.0–17.0)
MCH: 31.4 pg (ref 26.0–34.0)
MCHC: 31.8 g/dL (ref 30.0–36.0)
MCV: 98.7 fL (ref 80.0–100.0)
NRBC: 0 % (ref 0.0–0.2)
Platelets: 101 10*3/uL — ABNORMAL LOW (ref 150–400)
RBC: 3.82 MIL/uL — AB (ref 4.22–5.81)
RDW: 14.7 % (ref 11.5–15.5)
WBC: 8.2 10*3/uL (ref 4.0–10.5)

## 2018-08-22 LAB — BASIC METABOLIC PANEL
Anion gap: 6 (ref 5–15)
BUN: 17 mg/dL (ref 8–23)
CO2: 25 mmol/L (ref 22–32)
CREATININE: 1.08 mg/dL (ref 0.61–1.24)
Calcium: 8.5 mg/dL — ABNORMAL LOW (ref 8.9–10.3)
Chloride: 105 mmol/L (ref 98–111)
GFR calc Af Amer: >60 mL/min (ref >60)
GLUCOSE: 79 mg/dL (ref 70–99)
POTASSIUM: 4.5 mmol/L (ref 3.5–5.1)
SODIUM: 136 mmol/L (ref 135–145)

## 2018-08-22 MED ORDER — EZETIMIBE 10 MG PO TABS: 10.0000 mg | ORAL_TABLET | Freq: Every day | ORAL | Status: DC

## 2018-08-22 MED ORDER — FUROSEMIDE 40 MG PO TABS: 40.0000 mg | ORAL_TABLET | Freq: Every day | ORAL | 6 refills | Status: DC

## 2018-08-22 MED ORDER — FUROSEMIDE 40 MG PO TABS: 40.0000 mg | ORAL_TABLET | Freq: Every day | ORAL | Status: DC

## 2018-08-22 MED ORDER — EZETIMIBE 10 MG PO TABS: 10.0000 mg | ORAL_TABLET | Freq: Every day | ORAL | 6 refills | Status: DC

## 2018-08-22 MED ORDER — NITROGLYCERIN 0.4 MG SL SUBL: 0.4000 mg | SUBLINGUAL_TABLET | SUBLINGUAL | 4 refills | Status: DC | PRN

## 2018-08-22 MED ORDER — LOSARTAN POTASSIUM 50 MG PO TABS: 50.0000 mg | ORAL_TABLET | Freq: Every day | ORAL | 6 refills | Status: DC

## 2018-08-22 MED ORDER — LOSARTAN POTASSIUM 50 MG PO TABS
50.0000 mg | ORAL_TABLET | Freq: Every day | ORAL | Status: DC
  Administered 2018-08-22: 11:00:00 50 mg via ORAL
  Filled 2018-08-22: qty 1

## 2018-08-22 MED ORDER — TICAGRELOR 90 MG PO TABS: 90.0000 mg | ORAL_TABLET | Freq: Two times a day (BID) | ORAL | 4 refills | Status: DC

## 2018-08-22 MED ORDER — ASPIRIN 81 MG PO TBEC: 81.0000 mg | DELAYED_RELEASE_TABLET | Freq: Every day | ORAL | Status: DC

## 2018-08-22 MED ORDER — ACETAMINOPHEN 325 MG PO TABS: 650.0000 mg | ORAL_TABLET | ORAL | Status: DC | PRN

## 2018-08-22 MED FILL — BRILINTA 90 MG TABLET: 90 | 30 days supply | Qty: 60 | Fill #0 | Status: TO

## 2018-08-22 NOTE — Progress Notes (Signed)
Progress Note  Patient Name: Marcus WICKLIFF Sr. Date of Encounter: 08/22/2018  Primary Cardiologist: Skeet Latch, MD   Subjective   Feeling well.  Disappointed that he could not have both arteries opened at the same time.  Ambulated without chest pain or shortness of breath.  Inpatient Medications    Scheduled Meds: . amLODipine  2.5 mg Oral Daily  . aspirin EC  81 mg Oral Daily  . furosemide  40 mg Intravenous BID  . levothyroxine  100 mcg Oral QAC breakfast  . predniSONE  5 mg Oral Q breakfast  . pyridostigmine  60 mg Oral Daily  . sodium chloride flush  3 mL Intravenous Q12H  . ticagrelor  90 mg Oral BID   Continuous Infusions: . sodium chloride     PRN Meds: sodium chloride, acetaminophen, morphine injection, nitroGLYCERIN, ondansetron (ZOFRAN) IV, sodium chloride flush   Vital Signs    Vitals:   08/21/18 2100 08/21/18 2200 08/22/18 0500 08/22/18 0525  BP: (!) 146/75 (!) 141/64  (!) 141/71  Pulse: 64 64  65  Resp: (!) 24 (!) 23  (!) 21  Temp:    98.2 F (36.8 C)  TempSrc:    Oral  SpO2: 97% 97%  98%  Weight:   74.4 kg   Height:        Intake/Output Summary (Last 24 hours) at 08/22/2018 0826 Last data filed at 08/21/2018 2320 Gross per 24 hour  Intake 390.8 ml  Output 375 ml  Net 15.8 ml   Filed Weights   08/20/18 2004 08/21/18 0644 08/22/18 0500  Weight: 74.7 kg 74.4 kg 74.4 kg    Telemetry    Sinus rhythm.  Sinus bradycardia blocked PACs.  - Personally Reviewed  ECG    Sinus rhythm.  Rate 68 bpm.  RBBB.  PACs.  Blocked PACs- Personally Reviewed  Physical Exam   VS:  BP (!) 141/71 (BP Location: Left Arm)   Pulse 65   Temp 98.2 F (36.8 C) (Oral)   Resp (!) 21   Ht 5\' 8"  (1.727 m)   Wt 74.4 kg   SpO2 98%   BMI 24.94 kg/m  , BMI Body mass index is 24.94 kg/m. GENERAL:  Well appearing HEENT: Pupils equal round and reactive, fundi not visualized, oral mucosa unremarkable NECK:  No jugular venous distention, waveform within  normal limits, carotid upstroke brisk and symmetric, no bruits, no thyromegaly LUNGS: Clear to auscultation bilaterally. HEART:  RRR.  PMI not displaced or sustained,S1 and S2 within normal limits, no S3, no S4, no clicks, no rubs, II/VI holostystolic murmur anteriorly and at the apex. ABD:  Flat, positive bowel sounds normal in frequency in pitch, no bruits, no rebound, no guarding, no midline pulsatile mass, no hepatomegaly, no splenomegaly EXT:  2 plus pulses throughout, no edema, no cyanosis no clubbing.  Mild bleeding at right radial cath site. SKIN:  No rashes no nodules NEURO:  Cranial nerves II through XII grossly intact, motor grossly intact throughout [place]:  Cognitively intact, oriented to person place and time  Labs    Chemistry Recent Labs  Lab 08/20/18 1412 08/21/18 0313 08/22/18 0556  NA 137 138 136  K 4.0 5.2* 4.5  CL 105 111 105  CO2 26 22 25   GLUCOSE 95 88 79  BUN 17 18 17   CREATININE 0.98 1.06 1.08  CALCIUM 8.4* 8.1* 8.5*  GFRNONAA >60 >60 >60  GFRAA >60 >60 >60  ANIONGAP 6 5 6      Hematology  Recent Labs  Lab 08/20/18 1159 08/20/18 1412 08/22/18 0556  WBC 9.0 10.1 8.2  RBC 4.20* 4.15* 3.82*  HGB 13.6 12.8* 12.0*  HCT 40.6 42.1 37.7*  MCV 96.7 101.4* 98.7  MCH  --  30.8 31.4  MCHC 33.4 30.4 31.8  RDW 14.9 14.8 14.7  PLT 118.0* 113* 101*    Cardiac Enzymes Recent Labs  Lab 08/20/18 1412 08/20/18 1832 08/21/18 0313 08/21/18 0924  TROPONINI 0.78* 0.84* 0.87* 0.90*    Recent Labs  Lab 08/20/18 1421  TROPIPOC 0.62*     BNP Recent Labs  Lab 08/20/18 1159  PROBNP 1,659.0*     DDimer No results for input(s): DDIMER in the last 168 hours.   Radiology    Dg Chest 2 View  Result Date: 08/20/2018 CLINICAL DATA:  Chest pain for 1 week EXAM: CHEST - 2 VIEW COMPARISON:  02/12/2018 FINDINGS: Bilateral interstitial and alveolar airspace opacities primarily at the lung bases. Small bilateral pleural effusions. No pneumothorax. Stable  cardiomegaly. No acute osseous abnormality. IMPRESSION: Small bilateral pleural effusions and bilateral interstitial and alveolar airspace opacities. Differential considerations include bilateral lower lobe pneumonia versus mild pulmonary edema. Electronically Signed   By: Kathreen Devoid   On: 08/20/2018 15:34    Cardiac Studies   Echo 08/21/18: Study Conclusions  - Left ventricle: The cavity size was normal. Wall thickness was   increased in a pattern of mild LVH. Mid to apical inferior   hypokinesis. Hypokinesis of the apex. The estimated ejection   fraction was 45%. Features are consistent with a pseudonormal   left ventricular filling pattern, with concomitant abnormal   relaxation and increased filling pressure (grade 2 diastolic   dysfunction). - Aortic valve: Trileaflet; moderately calcified leaflets. There   was mild stenosis. There was moderate eccentric regurgitation.   Mean gradient (S): 10 mm Hg. Valve area (VTI): 1.73 cm^2. - Mitral valve: Moderately calcified annulus. Mildly calcified   leaflets. There is some degree of restriction of the posterior   leaflet. There was moderate to severe regurgitation. - Left atrium: The atrium was moderately dilated. - Right ventricle: The cavity size was normal. Systolic function   was normal. - Right atrium: The atrium was moderately to severely dilated. - Tricuspid valve: There was moderate regurgitation. Peak RV-RA   gradient 39 mmHg. - Pulmonary arteries: PA peak pressure: 47 mm Hg (S). - Systemic veins: IVC measured 2.5 cm with > 50% respirophasic   variation, suggesting RA pressure 8 mmHg.  Impressions:  - Normal LV size with mild LV hypertrophy. EF 45% with mid to   apical inferior hypokinesis and hypokinesis of the apex. Moderate   diastolic dysfunction. Normal RV size and systolic function.   Biatrial enlargement. Moderate to severe mitral regurgitation   with restriction of the posterior leaflet. Mild aortic stenosis    with moderate eccentric regurgitation. Mild pulmonary   hypertension.  LHC 08/21/18:  Prox RCA lesion is 95% stenosed.  Ost Cx lesion is 60% stenosed.  Prox Cx lesion is 60% stenosed.  Prox LAD lesion is 100% stenosed.  A drug-eluting stent was successfully placed.  Post intervention, there is a 0% residual stenosis.  There is moderate left ventricular systolic dysfunction.  LV end diastolic pressure is mildly elevated.  The left ventricular ejection fraction is 35-45% by visual estimate.   Patient Profile     Marcus [name] is an 75M with paroxysmal atrial fibrillation, myasthenia gravis, hyperlipidemia and thrombocytopenia here with NSTEMI.   Assessment & Plan    #  NSTEMI: # Hyperlipidemia: Marcus [name] underwent left heart catheterization on 11/21.  He was found to have a 95% RCA lesion and 100% LAD.  The RCA was successfully stented.  The LAD was not able to be opened.  He will need to return for attempted CTO with Dr. [place] or Dr. Irish Lack.  We will set this up as an outpatient.  No beta-blocker or statin due to myasthenia gravis.  LDL was 90 this admission.  We will start Zetia 10 mg.  Check lipids and CMP in 6 to 8 weeks.  Continue 12 months of dual antiplatelet therapy with aspirin and ticagrelor.  # Acute systolic and diastolic heart failure: # Moderate to severe mitral regurgitation: BNP was 1659 on admission.  Moderate to severe mitral regurgitation noted on echo.  There is a restricted mitral valve leaflet, likely due to ischemic mitral regurgitation.  Hopefully this will improve with revascularization.  Switch Lasix to 40 mg daily.  Start losartan 50 mg daily.  We will stop amlodipine, which was started this admission.  Check BMP in 1 week.  # Paroxysmal atrial fibrillation: Occurred in the setting of pneumonia.  He is not on chronic anticoagulation.  # Myasthenia Gravis: No beta blocker as above.  Continue pyridostigmine.   # Hypertension: BP labile but mostly  elevated.  He was started on amlodipine and blood pressure remains elevated.  Given his reduced systolic function we will stop amlodipine and start losartan as above.  For questions or updates, please contact [place] Please consult [email or web address] for contact info under        Signed, Skeet Latch, MD  08/22/2018, 8:26 AM

## 2018-08-22 NOTE — Care Management (Signed)
#    2.  S/W  KIMBERLY @   [facility]  #  [phone]   BRILINTA  90 MG BID COVER- YES CO-PAY- $ 30.00 TIER- 3 DRUG PRIOR APPROVAL- NO  PREFERRED PHARMACY : YES [facility]

## 2018-08-22 NOTE — Discharge Instructions (Signed)
Have blood drawn on the 27 of Nov.  At Dr. Blenda Mounts office to check kidney function   Call [place] at [phone] if any bleeding, swelling or drainage at cath site.  May shower, no tub baths for 48 hours for groin sticks. No lifting over 5 pounds for 3 days.  No Driving for 4 days if you drive.    Take 1 NTG, under your tongue, while sitting.  If no relief of pain may repeat NTG, one tab every 5 minutes up to 3 tablets total over 15 minutes.  If no relief CALL 911.  If you have dizziness/lightheadness  while taking NTG, stop taking and call 911.        Heart Healthy low salt diet.   Bring your meds with you to office appt  Do not stop Asprin and brilinta these medications keep your stent open

## 2018-08-22 NOTE — Care Management Note (Signed)
Case Management Note  Patient Details  Name: Marcus DARNELL Sr. MRN: [id number] Date of Birth: [birth year]  Subjective/Objective:  From home , s/p stent intervention, will be on brilinta,  He has 30 day savings coupon, NCM informed him of what his co pay will be for refills.                    Action/Plan: DC home.  Expected Discharge Date:  08/22/18               Expected Discharge Plan:  Home/Self Care  In-House Referral:     Discharge planning Services  CM Consult, Medication Assistance  Post Acute Care Choice:    Choice offered to:     DME Arranged:    DME Agency:     HH Arranged:    HH Agency:     Status of Service:  Completed, signed off  If discussed at [employer] of Stay Meetings, dates discussed:    Additional Comments:  Zenon Mayo, RN 08/22/2018, 12:37 PM

## 2018-08-22 NOTE — Telephone Encounter (Signed)
Patient currently admitted

## 2018-08-22 NOTE — Progress Notes (Signed)
CARDIAC REHAB PHASE I   PRE:  Rate/Rhythm: 70 SR   BP:  Sitting: 163/71        SaO2: 100 RA  MODE:  Ambulation: 1000 ft   POST:  Rate/Rhythm: 78 SR  BP:  Sitting: 189/104        SaO2: 100 RA  0810 - 0942  Pt ambulated 1000 ft independently. Pt denies CP and became slightly SOB when walking up the slight incline to the [employer]	[place]. Gait was strong. Reviewed the MI book. Pt given stent card and was educated on the importance of Brilinta and ASA. Pt was educated on PA restrictions and NTG usage with angina. Pt educated on exercise guidelines and diet (HH & low NA). Pt was given "off the beat" booklet for ed regarding afib. Pt expressed understanding of all ed. Pt referred to [facility] at [facility].   Philis Kendall, MS 08/22/2018 9:37 AM

## 2018-08-22 NOTE — Progress Notes (Addendum)
Called to the room by the patient, he has removed the RAD stat from his Right radial , he stated it was suppost to come off at 10:30.  Site without hematoma, will continue to monitor, dressing placed.

## 2018-08-22 NOTE — Discharge Summary (Addendum)
Discharge Summary    Patient ID: Marcus [name] Sr. MRN: [id number]; DOB: [birth year]  Admit date: 08/20/2018 Discharge date: 08/22/2018  Primary Care Provider: [name], MD  Primary Cardiologist: Skeet Latch, MD  Primary Electrophysiologist:  None   Discharge Diagnoses    Principal Problem:   Non-ST elevation (NSTEMI) myocardial infarction [facility]) Active Problems:   S/P angioplasty with stent 08/21/18 DES to RCA   Myasthenia gravis ([place])   PAF (paroxysmal atrial fibrillation) (HCC)   Chest pain   Nonrheumatic mitral valve regurgitation   Acute on chronic combined systolic and diastolic CHF (congestive heart failure) ([place])   Hypertension   Hyperlipidemia LDL goal <70   CAD in native artery, residual disease in LCX and LAD -   TEMI: # Hyperlipidemia: Mr. Bulman underwent left heart catheterization on 11/21.  He was found to have a 95% RCA lesion and 100% LAD.  The RCA was successfully stented.  The LAD was not able to be opened.  He will need to return for attempted CTO with Dr. Martinique or Dr. Irish Lack.  We will set this up as an outpatient.  No beta-blocker or statin due to myasthenia gravis.  LDL was 90 this admission.  We will start Zetia 10 mg.  Check lipids and CMP in 6 to 8 weeks.  Continue 12 months of dual antiplatelet therapy with aspirin and ticagrelor.  # Acute systolic and diastolic heart failure: # Moderate to severe mitral regurgitation: BNP was 1659 on admission.  Moderate to severe mitral regurgitation noted on echo.  There is a restricted mitral valve leaflet, likely due to ischemic mitral regurgitation.  Hopefully this will improve with revascularization.  Switch Lasix to 40 mg daily.  Start losartan 50 mg daily.  We will stop amlodipine, which was started this admission.  Check BMP in 1 week.  # Paroxysmal atrial fibrillation: Occurred in the setting of pneumonia.  He is not on chronic anticoagulation.  # Myasthenia Gravis: No beta blocker as  above.  Continue pyridostigmine.   # Hypertension: BP labile but mostly elevated.  He was started on amlodipine and blood pressure remains elevated.  Given his reduced systolic function we will stop amlodipine and start losartan as above.   Allergies Allergies  Allergen Reactions  . Beta Adrenergic Blockers Hives  . Statins Hives    Diagnostic Studies/Procedures    Cardiac cath and PCI 08/21/18  Prox RCA lesion is 95% stenosed.  Ost Cx lesion is 60% stenosed.  Prox Cx lesion is 60% stenosed.  Prox LAD lesion is 100% stenosed.  A drug-eluting stent was successfully placed.  Post intervention, there is a 0% residual stenosis.  There is moderate left ventricular systolic dysfunction.  LV end diastolic pressure is mildly elevated.  The left ventricular ejection fraction is 35-45% by visual estimate.   Successful proximal RCA PCI and drug-eluting stenting using a synergy drug-eluting stent postdilated to 3.3 mm with unsuccessful attempt at crossing mid LAD CTO.  His LV function is moderately reduced in the 40% range.  His LAD CTO can be treated in a staged fashion electively.  The sheath was removed and a TR band was placed on the right wrist to achieve patent hemostasis.  The patient left the lab in stable condition.   Echo 11/21/9 Study Conclusions  - Left ventricle: The cavity size was normal. Wall thickness was   increased in a pattern of mild LVH. Mid to apical inferior   hypokinesis. Hypokinesis of the apex. The estimated ejection  fraction was 45%. Features are consistent with a pseudonormal   left ventricular filling pattern, with concomitant abnormal   relaxation and increased filling pressure (grade 2 diastolic   dysfunction). - Aortic valve: Trileaflet; moderately calcified leaflets. There   was mild stenosis. There was moderate eccentric regurgitation.   Mean gradient (S): 10 mm Hg. Valve area (VTI): 1.73 cm^2. - Mitral valve: Moderately calcified  annulus. Mildly calcified   leaflets. There is some degree of restriction of the posterior   leaflet. There was moderate to severe regurgitation. - Left atrium: The atrium was moderately dilated. - Right ventricle: The cavity size was normal. Systolic function   was normal. - Right atrium: The atrium was moderately to severely dilated. - Tricuspid valve: There was moderate regurgitation. Peak RV-RA   gradient 39 mmHg. - Pulmonary arteries: PA peak pressure: 47 mm Hg (S). - Systemic veins: IVC measured 2.5 cm with > 50% respirophasic   variation, suggesting RA pressure 8 mmHg.  Impressions:  - Normal LV size with mild LV hypertrophy. EF 45% with mid to   apical inferior hypokinesis and hypokinesis of the apex. Moderate   diastolic dysfunction. Normal RV size and systolic function.   Biatrial enlargement. Moderate to severe mitral regurgitation   with restriction of the posterior leaflet. Mild aortic stenosis   with moderate eccentric regurgitation. Mild pulmonary   hypertension.  _____________   History of Present Illness     84 y.o. male with a hx of thrombocytopenia, hyperlipidemia, myasthenia gravis and paroxysmal atrial fibrillation that presented to ER 08/20/18 sent by his PCP for chest pain and abnormal troponin.  The day prior he was seen by pulmonary and complained of burning in his lungs and could not lie down due to burning.   He had prior DOE.   Troponin level was obtained and it was elevated 0.78 so sent to ER.  Additionally his BNP was 1659  CXR with bil. Pl effusions.  Normally pt is active and initially did not want cardiac cath.   Pt was admitted and IV heparin started.    Hospital Course     Consultants: none   Pt did well overnight and after thinking about cath and chest pain with walking in the hall he agreed to undergo the procedure.  He was diuresed overnight for his HF.  Echo was ordered as well.  Pt remained on prednisone for his myasthenia.  Pk troponin  0.90.   LDL was 96,    Pt underwent cardiac cath and he does have CAD in several vessels, see above.  Successful proximal RCA PCI and drug-eluting stenting using a synergy drug-eluting stent postdilated to 3.3 mm with unsuccessful attempt at crossing mid LAD CTO.  His LV function is moderately reduced in the 40% range.  His LAD CTO can be treated in a staged fashion electively.  Continue DAPT for 12 months.  He did well overnight and was seen today by Dr. Oval Linsey and found to be stable for discharge.  He walked with cardiac rehab and had no issues. Though mild dyspnea walking uphill.  We have asked him to go to cardiac rehab.  Tele with SB with blocked PACs and EKG with SR RBBB, no changes.   Echo with EF 45%, G2DD, PA pk pressure of 47 mmHg, mod TR, and mod to severe MR, mild AS   For his CTO of LAD this will be addressed as outpt.  Will need follow up appt with Dr. Martinique or Dr. Irish Lack  Will ask this to be done on OV.    With Myasthenia gravis no statin and no BB.   For his HLD will add zetia. 10 mg daily.  Needs hepatic and lipid panel in 6 weeks.   For his combined CHF, lasix changed to 40 daily, he will need BMP on the 27th. We also started losartan for EF 45%. Amlodipine stopped.    PAF none on this admit, only one episode with acute illness in 2016.  No anticoagulation.   _____________  Discharge Vitals Blood pressure (!) 163/71, pulse 65, temperature 97.9 F (36.6 C), temperature source Oral, resp. rate 18, height 5\' 8"  (1.727 m), weight 74.4 kg, SpO2 99 %.  Filed Weights   08/20/18 2004 08/21/18 0644 08/22/18 0500  Weight: 74.7 kg 74.4 kg 74.4 kg    Labs & Radiologic Studies    CBC Recent Labs    08/20/18 1159 08/20/18 1412 08/22/18 0556  WBC 9.0 10.1 8.2  NEUTROABS 8.1*  --   --   HGB 13.6 12.8* 12.0*  HCT 40.6 42.1 37.7*  MCV 96.7 101.4* 98.7  PLT 118.0* 113* 734*   Basic Metabolic Panel Recent Labs    08/21/18 0313 08/22/18 0556  NA 138 136  K 5.2* 4.5    CL 111 105  CO2 22 25  GLUCOSE 88 79  BUN 18 17  CREATININE 1.06 1.08  CALCIUM 8.1* 8.5*   Liver Function Tests No results for input(s): AST, ALT, ALKPHOS, BILITOT, PROT, ALBUMIN in the last 72 hours. No results for input(s): LIPASE, AMYLASE in the last 72 hours. Cardiac Enzymes Recent Labs    08/20/18 1832 08/21/18 0313 08/21/18 0924  TROPONINI 0.84* 0.87* 0.90*   BNP Invalid input(s): POCBNP D-Dimer No results for input(s): DDIMER in the last 72 hours. Hemoglobin A1C No results for input(s): HGBA1C in the last 72 hours. Fasting Lipid Panel Recent Labs    08/21/18 0924  CHOL 168  HDL 61  LDLCALC 96  TRIG 56  CHOLHDL 2.8   Thyroid Function Tests No results for input(s): TSH, T4TOTAL, T3FREE, THYROIDAB in the last 72 hours.  Invalid input(s): FREET3 _____________  Dg Chest 2 View  Result Date: 08/20/2018 CLINICAL DATA:  Chest pain for 1 week EXAM: CHEST - 2 VIEW COMPARISON:  02/12/2018 FINDINGS: Bilateral interstitial and alveolar airspace opacities primarily at the lung bases. Small bilateral pleural effusions. No pneumothorax. Stable cardiomegaly. No acute osseous abnormality. IMPRESSION: Small bilateral pleural effusions and bilateral interstitial and alveolar airspace opacities. Differential considerations include bilateral lower lobe pneumonia versus mild pulmonary edema. Electronically Signed   By: Kathreen Devoid   On: 08/20/2018 15:34   Disposition   Pt is being discharged home today in good condition.  Follow-up Plans & Appointments  Have blood drawn on the 27 of Nov.  At Dr. Blenda Mounts office to check kidney function   Call [place] at [phone] if any bleeding, swelling or drainage at cath site.  May shower, no tub baths for 48 hours for groin sticks. No lifting over 5 pounds for 3 days.  No Driving for 4 days if you drive.    Take 1 NTG, under your tongue, while sitting.  If no relief of pain may repeat NTG, one tab every 5  minutes up to 3 tablets total over 15 minutes.  If no relief CALL 911.  If you have dizziness/lightheadness  while taking NTG, stop taking and call 911.        Heart Healthy  low salt diet.   Bring your meds with you to office appt  Do not stop Asprin and brilinta these medications keep your stent open   Follow-up Information    Skeet Latch, MD Follow up on 09/02/2018.   Specialty:  Cardiology Why:  at 8:30 AM with her PA [employer] information: [address] [place] [address] [phone]          Discharge Instructions    Amb Referral to Cardiac Rehabilitation   Complete by:  As directed    Diagnosis:   NSTEMI Coronary Stents        Discharge Medications   Allergies as of 08/22/2018      Reactions   Beta Adrenergic Blockers Hives   Statins Hives      Medication List    STOP taking these medications   pantoprazole 40 MG tablet Commonly known as:  PROTONIX     TAKE these medications   acetaminophen 325 MG tablet Commonly known as:  TYLENOL Take 2 tablets (650 mg total) by mouth every 4 (four) hours as needed for headache or mild pain.   aspirin 81 MG EC tablet Take 1 tablet (81 mg total) by mouth daily.   ezetimibe 10 MG tablet Commonly known as:  ZETIA Take 1 tablet (10 mg total) by mouth daily.   furosemide 40 MG tablet Commonly known as:  LASIX Take 1 tablet (40 mg total) by mouth daily. Start taking on:  08/23/2018   levothyroxine 100 MCG tablet Commonly known as:  SYNTHROID, LEVOTHROID Take 100 mcg by mouth daily.   losartan 50 MG tablet Commonly known as:  COZAAR Take 1 tablet (50 mg total) by mouth daily.   nitroGLYCERIN 0.4 MG SL tablet Commonly known as:  NITROSTAT Place 1 tablet (0.4 mg total) under the tongue every 5 (five) minutes x 3 doses as needed for chest pain.   predniSONE 5 MG tablet Commonly known as:  DELTASONE Take 1 tablet (5 mg total) by mouth daily with breakfast. Take extra tablet as  needed   pyridostigmine 60 MG tablet Commonly known as:  MESTINON TAKE 1 TABLET(60 MG) BY MOUTH THREE TIMES DAILY What changed:  See the new instructions.   ticagrelor 90 MG Tabs tablet Commonly known as:  BRILINTA Take 1 tablet (90 mg total) by mouth 2 (two) times daily.        Acute coronary syndrome (MI, NSTEMI, STEMI, etc) this admission?: Yes.     AHA/ACC Clinical Performance & Quality Measures: 1. Aspirin prescribed? - Yes 2. ADP Receptor Inhibitor (Plavix/Clopidogrel, Brilinta/Ticagrelor or Effient/Prasugrel) prescribed (includes medically managed patients)? - Yes 3. Beta Blocker prescribed? - No - Myasthenia Gravis 4. High Intensity Statin (Lipitor 40-80mg  or Crestor 20-40mg ) prescribed? - No - zetia added, no statin due to Myasthenia 5. EF assessed during THIS hospitalization? - Yes 6. For EF <40%, was ACEI/ARB prescribed? - No - Reason:  na 7. For EF <40%, Aldosterone Antagonist (Spironolactone or Eplerenone) prescribed? - No - Reason:  na 8. Cardiac Rehab Phase II ordered (Included Medically managed Patients)? - Yes     Outstanding Labs/Studies   BMP on 08/27/18  Hepatic and lipid in 6-8 weeks appt with Dr. Martinique or Irish Lack for CTO  Duration of Discharge Encounter   Greater than 30 minutes including physician time.  Signed, Cecilie Kicks, NP 08/22/2018, 11:42 AM

## 2018-08-22 NOTE — Telephone Encounter (Signed)
TOC Patient-  Please call Patient-  Patient has an appointment with Kerin Ransom on 09-02-18.

## 2018-08-24 ENCOUNTER — Telehealth: Payer: Self-pay | Admitting: Internal Medicine

## 2018-08-24 NOTE — Telephone Encounter (Signed)
Cardiology Moon[place] Note  Return page from patient.  Notably, patient was admitted to [facility] on 11/20 for non-STEMI.  He underwent PCI to the RCA and was discharged on 11/22.  He has been doing well at home and taking all of his medications as prescribed.  He calls today because earlier this evening he had a sudden onset episode of palpitations lasting approximately 2 minutes.  He was not doing anything strenuous at the time.  During his episode of palpitations he checked his heart rate and blood pressure.  His heart rate was in the 44B and his systolic blood pressure was in the 90s.  He took 1 sublingual nitroglycerin tablet, despite having no symptoms of chest pain or chest pressure, and his palpitations eventually resolved spontaneously.  Of note, on review of the patient's discharge summary from his recent hospitalization he was noted to have at least one episode of atrial fibrillation, which was a new diagnosis for him.  My suspicion is that he had a recurrent episode of atrial fibrillation at home causing the symptoms that he is now describing.  I instructed the patient that he should return to the emergency department if he develops any new symptoms of chest pain, chest pressure, dizziness, lightheadedness, syncope, presyncope, or palpitations that do not resolve spontaneously on their own.  I will send a copy of this note to Dr. Oval Linsey, the patient's cardiologist, who will determine the next steps in the work-up and management of the patient's heart rhythm disturbance.  Marcie Mowers, MD Cardiology Fellow, PGY-6

## 2018-08-25 ENCOUNTER — Telehealth (HOSPITAL_COMMUNITY): Payer: Self-pay

## 2018-08-25 NOTE — Telephone Encounter (Signed)
Pt insurance is active and benefits verified through Medicare A/B. Co-pay $0.00, DED $185.00/$185.00 met, out of pocket $0.00/$0.00 met, co-insurance 20%. No pre-authorization required. Passport, 08/25/2018 @ 11:52AM, REF# [id number]  2ndary insurance is active and benefits verified through [facility]. Co-pay $0.00, DED $0.00/$0.00 met, out of pocket $0.00/$0.00 met, co-insurance 0%. No pre-authorization required. Passport, 08/25/2018 @ 11:54AM, REF# [id number]  Will contact patient to see if he is interested in the Cardiac Rehab Program. If interested, patient will need to complete follow up appt. Once completed, patient will be contacted for scheduling upon review by the RN Navigator.

## 2018-08-25 NOTE — Consult Note (Signed)
             [facility] CM Primary Care Navigator  08/25/2018  TRAMPAS STETTNER Sr. 08/07/1932 [id number]   Attempt to seepatient at the bedside to identify possible discharge needs buthewasdischargedhomeover the weekend.  Per MD note,patientpresented to the ER- was sent by his PCP (primary care provider) for chest pain and abnormal troponin. (status post cardiac catheterization and stenting, coronary artery disease in several vessels)  Patient has discharge instruction to follow-upwith cardiology on 09/02/18.  Primary care provider's office is listed as providing transition of care (TOC) follow-up.   For additional questions please contact:  Edwena Felty A. Makari Sanko, BSN, RN-BC [facility] PRIMARY CARE Navigator Cell: [phone]

## 2018-08-25 NOTE — Telephone Encounter (Signed)
Patient contacted regarding discharge from [place] on 08/22/18.  Patient understands to follow up with provider Kerin Ransom on 09/02/18 at 8:30 am at [facility]. Patient understands discharge instructions? Yes Patient understands medications and regiment? Yes Patient understands to bring all medications to this visit? Yes  Patient states that last night he began having a racing heart rate, not painful, but his heart rate was in the 90's, his normal is 62-65, his BP was 180/100, he states that he did take a nitro just in case and his BP went back to 140/80. Patient states this has never happened to him before, and he just had a cath done on 08/21/18. I spoke with DOD, Dr.Croitoru who states for patient to monitor, keep a log of BP and heart rate, and how long these issues last if it happens again. Patient was also advised if any sharp chest pains, back pain, or left arm pain to go to ER. Patient verbalized understanding.

## 2018-08-26 NOTE — Telephone Encounter (Signed)
Called patient to see if he is interested in the Cardiac Rehab Program. Patient stated not at this time, he plan on having another surgery after Christmas. Does not think he will be up to it, but will call if anything changes.  Closed referral

## 2018-08-27 ENCOUNTER — Other Ambulatory Visit: Payer: Self-pay | Admitting: *Deleted

## 2018-08-27 DIAGNOSIS — Z79899 Other long term (current) drug therapy: Secondary | ICD-10-CM | POA: Diagnosis not present

## 2018-08-27 LAB — BASIC METABOLIC PANEL
BUN / CREAT RATIO: 19 (ref 10–24)
BUN: 20 mg/dL (ref 8–27)
CHLORIDE: 98 mmol/L (ref 96–106)
CO2: 23 mmol/L (ref 20–29)
Calcium: 8.9 mg/dL (ref 8.6–10.2)
Creatinine, Ser: 1.05 mg/dL (ref 0.76–1.27)
GFR calc Af Amer: 76 mL/min/{1.73_m2} (ref >59)
GFR calc non Af Amer: 65 mL/min/{1.73_m2} (ref >59)
GLUCOSE: 79 mg/dL (ref 65–99)
POTASSIUM: 4.1 mmol/L (ref 3.5–5.2)
SODIUM: 136 mmol/L (ref 134–144)

## 2018-08-30 ENCOUNTER — Telehealth: Payer: Self-pay | Admitting: Nurse Practitioner

## 2018-08-30 NOTE — Telephone Encounter (Signed)
   Pt s/p recent DES to the RCA w/ CTO of LAD pending PCI at some point in the future.  He has cont to have DOE since procedure.  His wt is down 10 lbs since d/c.  Labs on 11/27 stable.  He says that he has been having anxiety and insomnia and would like something called in for him.  I rec that he contact his PCP.  If however, he has progressive Ss of dyspnea or develops c/p, he should come into the ED for eval.  Caller verbalized understanding.  Murray Hodgkins, NP 08/30/2018, 10:26 AM

## 2018-08-31 DIAGNOSIS — R0609 Other forms of dyspnea: Secondary | ICD-10-CM

## 2018-09-01 ENCOUNTER — Encounter: Payer: Self-pay | Admitting: Internal Medicine

## 2018-09-01 ENCOUNTER — Ambulatory Visit (INDEPENDENT_AMBULATORY_CARE_PROVIDER_SITE_OTHER)
Admission: RE | Admit: 2018-09-01 | Discharge: 2018-09-01 | Disposition: A | Discharge status: Home / Self Care | Payer: Medicare Other | Source: Ambulatory Visit | Attending: Internal Medicine | Admitting: Internal Medicine

## 2018-09-01 ENCOUNTER — Ambulatory Visit (INDEPENDENT_AMBULATORY_CARE_PROVIDER_SITE_OTHER): Payer: Medicare Other | Admitting: Internal Medicine

## 2018-09-01 ENCOUNTER — Telehealth: Payer: Self-pay

## 2018-09-01 VITALS — BP 122/74 | HR 82 | Ht 68.0 in | Wt 146.0 lb

## 2018-09-01 DIAGNOSIS — R0609 Other forms of dyspnea: Secondary | ICD-10-CM

## 2018-09-01 DIAGNOSIS — R0600 Dyspnea, unspecified: Secondary | ICD-10-CM | POA: Diagnosis not present

## 2018-09-01 DIAGNOSIS — R0789 Other chest pain: Secondary | ICD-10-CM

## 2018-09-01 DIAGNOSIS — G7000 Myasthenia gravis without (acute) exacerbation: Secondary | ICD-10-CM | POA: Diagnosis not present

## 2018-09-01 LAB — BASIC METABOLIC PANEL
BUN: 25 mg/dL — AB (ref 6–23)
CO2: 28 mEq/L (ref 19–32)
Calcium: 9.3 mg/dL (ref 8.4–10.5)
Chloride: 101 mEq/L (ref 96–112)
Creatinine, Ser: 1.24 mg/dL (ref 0.40–1.50)
GFR: 59.03 mL/min — ABNORMAL LOW (ref >60.00)
Glucose, Bld: 93 mg/dL (ref 70–99)
Potassium: 4.6 mEq/L (ref 3.5–5.1)
Sodium: 135 mEq/L (ref 135–145)

## 2018-09-01 LAB — CBC WITH DIFFERENTIAL/PLATELET
Basophils Absolute: 0 10*3/uL (ref 0.0–0.1)
Basophils Relative: 0.4 % (ref 0.0–3.0)
Eosinophils Absolute: 0.1 10*3/uL (ref 0.0–0.7)
Eosinophils Relative: 0.7 % (ref 0.0–5.0)
HCT: 42 % (ref 39.0–52.0)
Hemoglobin: 14.2 g/dL (ref 13.0–17.0)
LYMPHS ABS: 0.6 10*3/uL — AB (ref 0.7–4.0)
Lymphocytes Relative: 8.1 % — ABNORMAL LOW (ref 12.0–46.0)
MCHC: 33.7 g/dL (ref 30.0–36.0)
MCV: 95.8 fl (ref 78.0–100.0)
Monocytes Absolute: 0.5 10*3/uL (ref 0.1–1.0)
Monocytes Relative: 6.7 % (ref 3.0–12.0)
Neutro Abs: 6.1 10*3/uL (ref 1.4–7.7)
Neutrophils Relative %: 84.1 % — ABNORMAL HIGH (ref 43.0–77.0)
Platelets: 127 10*3/uL — ABNORMAL LOW (ref 150.0–400.0)
RBC: 4.39 Mil/uL (ref 4.22–5.81)
RDW: 15.4 % (ref 11.5–15.5)
WBC: 7.2 10*3/uL (ref 4.0–10.5)

## 2018-09-01 LAB — BRAIN NATRIURETIC PEPTIDE: PRO B NATRI PEPTIDE: 949 pg/mL — AB (ref 0.0–100.0)

## 2018-09-01 LAB — SEDIMENTATION RATE: SED RATE: 21 mm/h — AB (ref 0–20)

## 2018-09-01 NOTE — Telephone Encounter (Signed)
Spoke to pt on the phone and scheduled him for an appt today at 3:45pm with chest xray before visit. Nothing further is needed.

## 2018-09-01 NOTE — Progress Notes (Signed)
Subjective:     Patient ID: Marcus [name], male   DOB: 1932-09-25     MRN: [id number]    Brief patient profile:  85 yowm   MG on daily prednisone since 2013 quit smoking around 1976 with pfts wnl 12/31/16 but suspected component of AB suppressed on chronic pred for MG    History of Present Illness   12/15/2015 acute extended ov/[name] re: cough /sob on prednisone 20 x one week  prior to [place]  Patient presents with  . Acute Visit    Pt c/o sob and cough x 3 days. Cough is prod with white sputum.   acute onset s travel this episode, comfortable at rest though more difficult in supine position due to sob/cough  last night first time this happened since dx of CAP but comfortable at rest sitting. rec dulera 100 Take 2 puffs first thing in am and then another 2 puffs about 12 hours later.  For cough / congestion mucinex dm 1200 mg every 12 hours Levaquin 500mg  daily x 7 days   05/19/2018  f/u ov/[name] re:  MG / no longer on resp rx at all  Chief Complaint  Patient presents with  . Follow-up    He is doing well at this time no symptoms.  Dyspnea:  Walking up to a quarter mile nl pace /   able to work out a gym 6 x weekly just wts/ not sob Cough: none Sleeping: on side 30 degrees bunch of pillows  rec Incentive spirometry at least twice daily - set the leveler at the first effort and beat it on the next 3 efforts    NP eval 08/12/18 "chest burning" Prednisone 10mg  tablet  >>> 2 tabs for 5 days (20mg  daily), then 1 tab for 5 days (10mg  daily), then resume 5 mg daily If not improving please contact our office or present to ER     08/19/2018 acute extended ov/[name] re: chest burning  Chief Complaint  Patient presents with  . Acute Visit    Still has "burning in lungs"- notices after exertionand he finds it uncomfortable to lie down.  He also c/o hoarseness today.   onset 08/09/18 while exerting gen ant chest burning assoc with doe and breathing got better at rest  but burning  continued, non radiating   s n or v or diaphoresis and described as constant =sitting / supine resolved completely p taking  Pm ativan 08/17/18 and when woke up  100% better am 08/18/18 and stayed better until am of ov then  woke up with burning pain not related to breathing but this time just isolated to L parasternal where previously was bilateral.  No assoc cough/ no pain brought on by cough or deep breath or walking or relation to meals.  No flare of weakness from MG, no better on prednisone rx. rec Protonix 40 mg Take 30- 60 min before your first and last meals of the day  (or pepcid 20 mg otc twice daily after meals, though this is not as effective as protonix)  Continue prednisone 5 mg daily  GERD  diet  check labs > c/w chf  / NSTEMI  > to ER > stent done  RCA/ ef 35-45 % with LVEDP 18 p diuresis    09/01/2018 acute extended ov/[name] re: ? Recurrent sob s/p stent  Chief Complaint  Patient presents with  . Acute Visit    had cardiac stent placed 08/21/18- since then his breathing  has been progressively worse. He is scheduled to see cards 09/02/18.   by the time of d/c from stents walking halls and at home did some hills slow pace s sob w/in 24 h of discharge but none since. Convinced the brilinata is making him sob   Gradually worse across doe x across the house steadily down since day of d/c  No cough / pos voice raspy/ no typical  MG symptoms - no sob at rest and no orthopnea nor recurrent cp    No obvious day to day or daytime variability or assoc excess/ purulent sputum or mucus plugs or hemoptysis or cp or chest tightness, subjective wheeze or overt sinus or hb symptoms.   Sleeping flat bed one pillow  without nocturnal  or early am exacerbation  of respiratory  c/o's or need for noct saba. Also denies any obvious fluctuation of symptoms with weather or environmental changes or other aggravating or alleviating factors except as outlined above   No unusual exposure hx or h/o childhood  pna/ asthma or knowledge of premature birth.  Current Allergies, Complete Past Medical History, Past Surgical History, Family History, and Social History were reviewed in [employer] record.  ROS  The following are not active complaints unless bolded Hoarseness, sore throat, dysphagia, dental problems, itching, sneezing,  nasal congestion or discharge of excess mucus or purulent secretions, ear ache,   fever, chills, sweats, unintended wt loss or wt gain, classically pleuritic or exertional cp,  orthopnea pnd or arm/hand swelling  or leg swelling, presyncope, palpitations, abdominal pain, anorexia, nausea, vomiting, diarrhea  or change in bowel habits or change in bladder habits, change in stools or change in urine, dysuria, hematuria,  rash, arthralgias, visual complaints, headache, numbness, weakness or ataxia or problems with walking or coordination,  change in mood or  memory.        Current Meds  Medication Sig  . acetaminophen (TYLENOL) 325 MG tablet Take 2 tablets (650 mg total) by mouth every 4 (four) hours as needed for headache or mild pain.  Marland Kitchen aspirin EC 81 MG EC tablet Take 1 tablet (81 mg total) by mouth daily.  . busPIRone (BUSPAR) 5 MG tablet Take 5 mg by mouth 2 (two) times daily.  Marland Kitchen ezetimibe (ZETIA) 10 MG tablet Take 1 tablet (10 mg total) by mouth daily.  . furosemide (LASIX) 40 MG tablet Take 1 tablet (40 mg total) by mouth daily.  Marland Kitchen levothyroxine (SYNTHROID, LEVOTHROID) 100 MCG tablet Take 100 mcg by mouth daily.   Marland Kitchen losartan (COZAAR) 50 MG tablet Take 1 tablet (50 mg total) by mouth daily.  . nitroGLYCERIN (NITROSTAT) 0.4 MG SL tablet Place 1 tablet (0.4 mg total) under the tongue every 5 (five) minutes x 3 doses as needed for chest pain.  . predniSONE (DELTASONE) 5 MG tablet Take 1 tablet (5 mg total) by mouth daily with breakfast. Take extra tablet as needed  . pyridostigmine (MESTINON) 60 MG tablet TAKE 1 TABLET(60 MG) BY MOUTH THREE TIMES DAILY  (Patient taking differently: Take 60 mg by mouth daily. )  . ticagrelor (BRILINTA) 90 MG TABS tablet Take 1 tablet (90 mg total) by mouth 2 (two) times daily.                      Objective:   Physical Exam  Anxious wm nad    09/01/2018          146  08/19/2018  168  05/19/2018          163  10/03/2016          185  01/03/2016          209   12/15/2015       206   04/13/15 210 lb 6.4 oz (95.437 kg)  06/13/12 205 lb 7.5 oz (93.2 kg)  06/11/12 202 lb (91.627 kg)    Vital signs reviewed - Note on arrival 02 sats  96% on RA    HEENT: nl dentition, turbinates bilaterally, and oropharynx. Nl external ear canals without cough reflex   NECK :  without JVD/Nodes/TM/ nl carotid upstrokes bilaterally   LUNGS: no acc muscle use,  Nl contour chest which is clear to A and P bilaterally without cough on insp or exp maneuvers   CV:  RRR  no s3 or murmur or increase in P2, and no edema either LE    ABD:  soft and nontender with nl inspiratory excursion in the supine position. No bruits or organomegaly appreciated, bowel sounds nl  MS:  Nl gait/ ext warm without deformities, calf tenderness, cyanosis or clubbing No obvious joint restrictions   SKIN: warm and dry without lesions    NEURO:  alert, approp, nl sensorium with  no motor or cerebellar deficits apparent.         CXR PA and Lateral:   09/01/2018 :    I personally reviewed images and agree with radiology impression as follows:    Stable cardiomegaly with atherosclerotic aorta. Hyperinflated lungs compatible with emphysema. No acute pulmonary consolidation nor overt pulmonary edema.       Labs ordered/ reviewed:      Chemistry      Component Value Date/Time   NA 135 09/01/2018 1602   NA 136 08/27/2018 0959   K 4.6 09/01/2018 1602   CL 101 09/01/2018 1602   CO2 28 09/01/2018 1602   BUN 25 (H) 09/01/2018 1602   BUN 20 08/27/2018 0959   CREATININE 1.24 09/01/2018 1602      Component Value Date/Time    CALCIUM 9.3 09/01/2018 1602   ALKPHOS 80 09/17/2016 1540   AST 18 09/17/2016 1540   ALT 22 09/17/2016 1540   BILITOT 0.6 09/17/2016 1540        Lab Results  Component Value Date   WBC 7.2 09/01/2018   HGB 14.2 09/01/2018   HCT 42.0 09/01/2018   MCV 95.8 09/01/2018   PLT 127.0 (L) 09/01/2018       Lab Results  Component Value Date   TSH 1.28 10/03/2016     Lab Results  Component Value Date   PROBNP 949.0 (H) 09/01/2018       Lab Results  Component Value Date   ESRSEDRATE 21 (H) 09/01/2018   ESRSEDRATE 13 08/20/2018                             Assessment:

## 2018-09-01 NOTE — Telephone Encounter (Signed)
Notes recorded by Frederik Schmidt, RN on 09/01/2018 at 8:28 AM EST The patient has been notified of the result and verbalized understanding. All questions (if any) were answered. Frederik Schmidt, RN 09/01/2018 8:28 AM

## 2018-09-01 NOTE — Telephone Encounter (Signed)
-----   Message from Lonn Georgia, PA-C sent at 08/29/2018 11:47 AM EST ----- Please let him know BMET is fine, keep f/u appt.  Thanks

## 2018-09-01 NOTE — Patient Instructions (Addendum)
Please remember to go to the lab department   for your tests - we will call you with the results when they are available.   If not better, start protonix 40 mg Take 30- 60 min before your first and last meals of the day or pepcid 20 mg after bfast and supper.   Change follow up to 4 weeks

## 2018-09-01 NOTE — Telephone Encounter (Signed)
Dr.wert: Eight days after my stent implant I have grown more short of breath almost daily.  I can breath when still but with the slightest physical activity it becomes difficult.  As I have an appointment follow up MAY I please come in Monday morning and have you see  if you can shed any light on why this is.Someone suggested it is a reaction to the [place] they  put me on?? Also,I would like for to advise should I have oxygen in home in case needed and  how to arrange that?When getting discharged from [facility] I walk the corridors with a nurse similare to what   you had me do,only more slowly.A week later I feel I could not do that now.  Please have your staff call if you are willing for me to come in.Many thanks,Marcus [name] [birth year]  [phone]   ____________________________________________________________________________________________  Dr. Melvyn Novas, I have attached patients e-mail above. Please advise once available and I can contact the patient with your response. Thank you.

## 2018-09-01 NOTE — Telephone Encounter (Signed)
Ok to add on but must bring all meds and will need a cxr first from the [facility]

## 2018-09-02 ENCOUNTER — Ambulatory Visit (INDEPENDENT_AMBULATORY_CARE_PROVIDER_SITE_OTHER): Payer: Medicare Other | Admitting: Cardiology

## 2018-09-02 ENCOUNTER — Other Ambulatory Visit: Payer: Self-pay | Admitting: Cardiology

## 2018-09-02 ENCOUNTER — Telehealth: Payer: Self-pay | Admitting: *Deleted

## 2018-09-02 ENCOUNTER — Encounter: Payer: Self-pay | Admitting: Internal Medicine

## 2018-09-02 ENCOUNTER — Encounter: Payer: Self-pay | Admitting: Cardiology

## 2018-09-02 VITALS — BP 134/82 | HR 62 | Ht 68.0 in | Wt 147.0 lb

## 2018-09-02 DIAGNOSIS — R0609 Other forms of dyspnea: Secondary | ICD-10-CM | POA: Diagnosis not present

## 2018-09-02 MED ORDER — CLOPIDOGREL BISULFATE 75 MG PO TABS: ORAL_TABLET | ORAL | 0 refills | Status: DC

## 2018-09-02 MED ORDER — FUROSEMIDE 40 MG PO TABS: ORAL_TABLET | ORAL | 2 refills | Status: DC

## 2018-09-02 MED ORDER — CLOPIDOGREL BISULFATE 75 MG PO TABS: 75.0000 mg | ORAL_TABLET | Freq: Every day | ORAL | 2 refills | Status: DC

## 2018-09-02 NOTE — Progress Notes (Signed)
Spoke with pt and notified of results per Dr. Wert. Pt verbalized understanding and denied any questions. 

## 2018-09-02 NOTE — Assessment & Plan Note (Signed)
Burning cp onset 06/19/18 >  To ER 08/20/18 c/w ischemic cm > RCA stent done   No evidence clinically of recurrent "burning" but still sob (see separate a/p)

## 2018-09-02 NOTE — Telephone Encounter (Signed)
Spoke with the pt and notified of recs per MW He verbalized understanding and denied any questions   

## 2018-09-02 NOTE — Assessment & Plan Note (Signed)
- spirometry 01/03/2016   FVC  3.91  10/03/2016  Walked RA x 3 laps @ 185 ft each stopped due to  End of study, nl pace, no sob or desat   Min fatigue - Spirometry 10/03/2016  FVC  5.13  But f/v loop not physiologic  - Echo 10/22/2016  Features are consistent with a pseudonormal left ventricular filling pattern, with concomitant abnormal relaxation and increased filling pressure (grade 2 diastolic dysfunction). - Aortic valve: There was mild regurgitation. Valve area (VTI): 2.17 cm^2. - Mitral valve: Calcified annulus. There was mild regurgitation. - Left atrium: The atrium was moderately dilated. - Pulmonary arteries: PA peak pressure: 32 mm Hg (S). PFTs 12/31/16  wnl with FVC 3.46 (96%)  - 08/19/2018  Walked RA x 2 laps @ 240ft each stopped due to  End of study, no sob/no cp/no desats - 09/01/2018   Walked RA x one lap = 210 ft - stopped due to  Sob/ sats still 98%, pulse only 74 and no chest burning   Again Symptoms are markedly disproportionate to objective findings and not clear to what extent this is actually a pulmonary  problem but pt does appear to have difficult to sort out respiratory symptoms of unknown origin for which  DDX  = almost all start with A and  include Adherence, Ace Inhibitors, Acid Reflux, Active Sinus Disease, Alpha 1 Antitripsin deficiency, Anxiety masquerading as Airways dz,  ABPA,  Allergy(esp in young), Aspiration (esp in elderly), Adverse effects of meds,  Active smoking or Vaping, A bunch of PE's/clot burden (a few small clots can't cause this syndrome unless there is already severe underlying pulm or vascular dz with poor reserve),  Anemia or thyroid disorder, plus two Bs  = Bronchiectasis and Beta blocker use..and one C= CHF     Adherence is always the initial "prime suspect" and is a multilayered concern that requires a "trust but verify" approach in every patient - starting with knowing how to use medications, especially inhalers, correctly, keeping up with  refills and understanding the fundamental difference between maintenance and prns vs those medications only taken for a very short course and then stopped and not refilled.  - needs to return with all meds in hand using a trust but verify approach to confirm accurate Medication  Reconciliation The principal here is that until we are certain that the  patients are doing what we've asked, it makes no sense to ask them to do more.   ? Acid (or non-acid) GERD > always difficult to exclude as up to 75% of pts in some series report no assoc GI/ Heartburn symptoms> rec max (24h)  acid suppression and diet restrictions/ reviewed and instructions given in writing.   ? Anxiety/depression/ deconditioning  > usually at the bottom of this list of usual suspects but should be much higher on this pt's based on H and P and note already on psychotropics and may interfere with adherence and also interpretation of response or lack thereof to symptom management which can be quite subjective.  ? A bunch of PE's > very unlikely based on today's observed walking study and d dimer not likely helpful here as false pos rate very high so not done   ? Anemia/ thyroid dz > hgb ok - no recent  tsh on file though with pulse only 74 walking down hyperthyroid here and hypothyroidism does not usually cause sob   ? Adverse drug effects > none of the usual suspects on file and  doubt brilinta causing sob but defer this issue to cards to sort out   ? chf > bnp still moderately high, has cards eval 09/02/2018 rec he keep it

## 2018-09-02 NOTE — Telephone Encounter (Signed)
-----   Message from Tanda Rockers, MD sent at 09/02/2018  4:57 AM EST ----- Make sure he's using/ charting his lung volumes on IC at least twice weakly to be sure MG is not affecting his lungs - that was a prev rec he should still be following

## 2018-09-02 NOTE — Progress Notes (Signed)
09/02/2018 Marcus Kaufmann Sr.   28-Aug-1932  [id number]  Primary Physician [name], MD Primary Cardiologist: Dr Oval Linsey  HPI: Patient is a pleasant 84 year old male with a history of myasthenia gravis and remote PAF in the setting of pneumonia July 2016.  He presented 08/20/2018 with a non-ST elevation MI.  08/21/2018 he underwent diagnostic catheterization and intervention with a DES to his RCA.  There is good results in the RCA.  Patient did have a residual total LAD that was crossed but unable to be open.  He also has a 60% circumflex.  Echocardiogram showed an EF of 40% with moderate to severe MR.  Though his LVEDP at cath was normal he was felt to be volume overloaded.  He was diuresed and sent home on a diuretic as well.  Since discharge the patient says he has been fatigued and short of breath, even worse than he was prior to his intervention.  He denies orthopnea.  He wondered if his symptoms warrant from [place].  He actually saw Dr. Melvyn Novas yesterday, Dr. Melvyn Novas did not think it was a pulmonary issue.  Patient denies chest pain, mainly his complaint is dyspnea on exertion and fatigue in the afternoon.   Current Outpatient Medications  Medication Sig Dispense Refill  . acetaminophen (TYLENOL) 325 MG tablet Take 2 tablets (650 mg total) by mouth every 4 (four) hours as needed for headache or mild pain.    Marland Kitchen aspirin EC 81 MG EC tablet Take 1 tablet (81 mg total) by mouth daily.    . busPIRone (BUSPAR) 5 MG tablet Take 5 mg by mouth 2 (two) times daily.  0  . ezetimibe (ZETIA) 10 MG tablet Take 1 tablet (10 mg total) by mouth daily. 30 tablet 6  . furosemide (LASIX) 40 MG tablet Take 1 tablet (40 mg total) by mouth daily. 30 tablet 6  . levothyroxine (SYNTHROID, LEVOTHROID) 100 MCG tablet Take 100 mcg by mouth daily.   3  . losartan (COZAAR) 50 MG tablet Take 1 tablet (50 mg total) by mouth daily. 30 tablet 6  . nitroGLYCERIN (NITROSTAT) 0.4 MG SL tablet Place 1 tablet (0.4 mg  total) under the tongue every 5 (five) minutes x 3 doses as needed for chest pain. 25 tablet 4  . predniSONE (DELTASONE) 5 MG tablet Take 1 tablet (5 mg total) by mouth daily with breakfast. Take extra tablet as needed 120 tablet 1  . pyridostigmine (MESTINON) 60 MG tablet TAKE 1 TABLET(60 MG) BY MOUTH THREE TIMES DAILY (Patient taking differently: Take 60 mg by mouth daily. ) 270 tablet 1  . ticagrelor (BRILINTA) 90 MG TABS tablet Take 1 tablet (90 mg total) by mouth 2 (two) times daily. 180 tablet 4   No current facility-administered medications for this visit.     Allergies  Allergen Reactions  . Beta Adrenergic Blockers Hives  . Statins Hives    Past Medical History:  Diagnosis Date  . Acute on chronic combined systolic and diastolic CHF (congestive heart failure) ([place]) 08/22/2018  . Acute on chronic respiratory failure ([place]) 04/15/2015  . CAD in native artery 08/22/2018  . CAP (community acquired pneumonia) 04/13/2015   See cxr 04/13/2015 > admit [facility]    . H/O hiatal hernia   . Hyperlipidemia LDL goal <70 08/22/2018  . Hypertension 08/22/2018  . Hypothyroidism 04/13/2015  . Myasthenia gravis ([place]) 04/13/2015  . Obesity 04/13/2015  . Paroxysmal A-fib ([place]) 04/16/2015  . S/P angioplasty with stent 08/21/18 DES to RCA  08/22/2018  . Sepsis ([place]) 04/15/2015    Social History   Socioeconomic History  . Marital status: Single    Spouse name: Not on file  . Number of children: 7  . Years of education: Not on file  . Highest education level: Not on file  Occupational History  . Not on file  Social Needs  . Financial resource strain: Not on file  . Food insecurity:    Worry: Not on file    Inability: Not on file  . Transportation needs:    Medical: Not on file    Non-medical: Not on file  Tobacco Use  . Smoking status: Former Smoker    Packs/day: 1.00    Years: 30.00    Pack years: 30.00    Types: Cigarettes    Last attempt to quit: 01/10/1982    Years since quitting: 36.6    . Smokeless tobacco: Never Used  Substance and Sexual Activity  . Alcohol use: Yes    Alcohol/week: 2.0 standard drinks    Types: 2 Glasses of wine per week    Comment: 3-4 per week   . Drug use: No  . Sexual activity: Never  Lifestyle  . Physical activity:    Days per week: Not on file    Minutes per session: Not on file  . Stress: Not on file  Relationships  . Social connections:    Talks on phone: Not on file    Gets together: Not on file    Attends religious service: Not on file    Active member of club or organization: Not on file    Attends meetings of clubs or organizations: Not on file    Relationship status: Not on file  . Intimate partner violence:    Fear of current or ex partner: Not on file    Emotionally abused: Not on file    Physically abused: Not on file    Forced sexual activity: Not on file  Other Topics Concern  . Not on file  Social History Narrative   Lives alone in a two story home.  Divorced.  Has 7 children.     Retired from [employer].     Education: some college.     Family History  Problem Relation Age of Onset  . Other Mother        Deceased, 75  . Heart attack Father        Deceased, 48  . Healthy Sister   . Healthy Daughter        x 4  . Healthy Son        x 3     Review of Systems: General: negative for chills, fever, night sweats or weight changes.  Cardiovascular: negative for chest pain, edema, orthopnea, palpitations, paroxysmal nocturnal dyspnea Dermatological: negative for rash Respiratory: negative for cough or wheezing Urologic: negative for hematuria Abdominal: negative for nausea, vomiting, diarrhea, bright red blood per rectum, melena, or hematemesis Neurologic: negative for visual changes, syncope, or dizziness All other systems reviewed and are otherwise negative except as noted above.    Blood pressure 134/82, pulse 62, height 5\' 8"  (1.727 m), weight 147 lb (66.7 kg), SpO2 97 %.  General appearance:  alert, cooperative, appears stated age and no distress Neck: no carotid bruit and no JVD Lungs: clear to auscultation bilaterally Heart: regular rate and rhythm and soft MR murmur Abdomen: soft, non-tender; bowel sounds normal; no masses,  no organomegaly Extremities: no edema Skin: warm  and dry Neurologic: Grossly normal  EKG NSR, HR 62, LVH< RBBB, LAFB  ASSESSMENT AND PLAN:   # DOE-this may be from [place].  He does not appear to be in congestive heart failure on exam, though his recent BN P was 900.  # NSTEMI  Found to have a 95% RCA lesion and 100% LAD.The RCA was successfully stented. The LAD was not able to be opened. He will need to return for attempted CTO with Dr. Martinique.   # Moderate to severe mitral regurgitation: Moderate to severe mitral regurgitation noted on echo. He says he has lost > 10 lbs since adm.  # Paroxysmal atrial fibrillation: Occurred in the setting of pneumonia in 2016. He is not on chronic anticoagulation.  # Myasthenia Gravis: No beta blocker as above. Continue pyridostigmine.   # Hypertension: ARB is new     PLAN discussed with Dr. Martinique today.  We will go ahead and switch him from Brilinta to Plavix.  We will give him 300 mg starting tomorrow and then 75 mg a day.  Dr. Martinique wants to give him at least 4 weeks to heal up from his recent PCI and non-STEMI.  Then he will see him back to discuss an attempt at opening the LAD.  I also asked the patient did decrease his Lasix to 20 mg on Monday Wednesday and Friday and 40 mg other days.  Kerin Ransom PA-C 09/02/2018 9:10 AM

## 2018-09-02 NOTE — Addendum Note (Signed)
Addended by: Alvina Filbert B on: 09/02/2018 06:51 PM   Modules accepted: Orders

## 2018-09-02 NOTE — Assessment & Plan Note (Signed)
Spirometry 01/03/2016   VC = 3.91 and repeat 10/03/2016  5.13    No evidence of active MG on pred 5 mg daily but advised to monitor IC at least twice weekly and daily if sob changes    I had an extended discussion with the patient  And wife  reviewing all relevant studies completed to date including extensive inpt records  and  lasting 15 to 20 minutes of a 25 minute visit  which included directly observing ambulatory 02 saturation study documented in a/p section of  today's  office note.  Each maintenance medication was reviewed in detail including most importantly the difference between maintenance and prns and under what circumstances the prns are to be triggered using an action plan format that is not reflected in the computer generated alphabetically organized AVS.     Please see AVS for specific instructions unique to this visit that I personally wrote and verbalized to the the pt in detail and then reviewed with pt  by my nurse highlighting any changes in therapy recommended at today's visit .

## 2018-09-02 NOTE — Patient Instructions (Addendum)
Medication Instructions:  CHANGE Lasix Take 20mg  on Monday, Wednesday and Friday and 40mg  on Tuesday, Thursday, Saturday and Sunday STOP Brilinta after tonights dose  START Plavix tomorrow (Wednesday 09/02/2018) Take 4 (75mg ) tablets  For first dose then TAKE 1 tablet daily furthermore If you need a refill on your cardiac medications before your next appointment, please call your pharmacy.   Lab work: None  If you have labs (blood work) drawn today and your tests are completely normal, you will receive your results only by: Marland Kitchen MyChart Message (if you have MyChart) OR . A paper copy in the mail If you have any lab test that is abnormal or we need to change your treatment, we will call you to review the results.  Testing/Procedures: none  Follow-Up: At [facility], you and your health needs are our priority.  As part of our continuing mission to provide you with exceptional heart care, we have created designated Provider Care Teams.  These Care Teams include your primary Cardiologist (physician) and Advanced Practice Providers (APPs -  Physician Assistants and Nurse Practitioners) who all work together to provide you with the care you need, when you need it. Your physician recommends that you schedule a follow-up appointment in: the week of Chrismas or the first week in January with DR Martinique ONLY  Any Other Special Instructions Will Be Listed Below (If Applicable).

## 2018-09-05 ENCOUNTER — Telehealth: Payer: Self-pay | Admitting: *Deleted

## 2018-09-05 ENCOUNTER — Ambulatory Visit: Payer: Medicare Other | Admitting: Internal Medicine

## 2018-09-05 DIAGNOSIS — I480 Paroxysmal atrial fibrillation: Secondary | ICD-10-CM

## 2018-09-05 NOTE — Telephone Encounter (Signed)
Advised patient, verbalized understanding  

## 2018-09-05 NOTE — Telephone Encounter (Signed)
-----   Message from Skeet Latch, MD sent at 09/01/2018 11:51 AM EST ----- Please have him wear a 30 day event monitor.  ----- Message ----- From: Marcie Mowers, MD Sent: 08/24/2018   9:52 PM EST To: Skeet Latch, MD

## 2018-09-10 ENCOUNTER — Ambulatory Visit (INDEPENDENT_AMBULATORY_CARE_PROVIDER_SITE_OTHER): Payer: Medicare Other

## 2018-09-10 DIAGNOSIS — I480 Paroxysmal atrial fibrillation: Secondary | ICD-10-CM | POA: Diagnosis not present

## 2018-09-11 ENCOUNTER — Telehealth: Payer: Self-pay | Admitting: Interventional Cardiology

## 2018-09-11 NOTE — Telephone Encounter (Signed)
    I spoke to Mr. Kerney regarding possible CTO PCI of the LAD.  He states that he is feeling better since his Brilinta was changed to Plavix.  His SHOB has resolved.  He has had no further chest pain and has not used SL NTG.  He expressed a fear of anesthesia and having to be "put under" in the event of a complication leading to emergency CABG.  He expressed disappointment that Dr. [name] was not able to complete this PCI during the initial procedure.  He states he looked up Dr. [name] on the Internet and found that he was "less experienced."  That is why he preferred seeing Dr. Martinique.  I explained to him that CTO procedures are more complex and often require another access site, and we do these procedures with 2 physicians, Dr. Martinique and myself.  I answered all of his questions about procedure.  He took my name and will think about what he wants to do.  I did express to him that since he was feeling better since the medicine change, the urgency of PCI was less.  He thanked me for the call.  He will get back to [place].  Jettie Booze, MD

## 2018-09-17 ENCOUNTER — Encounter (HOSPITAL_COMMUNITY): Payer: Self-pay

## 2018-09-17 ENCOUNTER — Ambulatory Visit (HOSPITAL_COMMUNITY): Admit: 2018-09-17 | Payer: Medicare Other | Admitting: Interventional Cardiology

## 2018-09-17 SURGERY — CORONARY CTO INTERVENTION
Anesthesia: LOCAL

## 2018-09-22 ENCOUNTER — Telehealth: Payer: Self-pay | Admitting: *Deleted

## 2018-09-22 MED ORDER — CLOPIDOGREL BISULFATE 75 MG PO TABS: 75.0000 mg | ORAL_TABLET | Freq: Every day | ORAL | 2 refills | Status: DC

## 2018-09-22 NOTE — Telephone Encounter (Signed)
Patient would like to just discuss at follow up visit with Dr Oval Linsey, does not want to start at this time.    Skeet Latch, MD  Earvin Hansen, LPN        Dr. Posey Pronto, Marcus [name]'s neurologist it is OK for him to be on a statin for his cholesterol. Lets start rosuvastatin 10mg  daily, which is a low dose. If he notices any muscle weakness or pain let us know. Check lipids and CMP in 6 weeks.

## 2018-09-25 NOTE — Progress Notes (Signed)
Follow-up Visit   Date: 09/26/18   Marcus A Gregg Sr. MRN: [id number] DOB: 1931-10-23   Interim History: [name] is a 84 y.o. right-handed Caucasian male with hypothyroidism, paroxysmal atrial fibrillation, and seropositive myasthenia gravis, and prior tobacco use returning to the clinic for follow-up of myasthenia gravis.  The patient was accompanied to the clinic by self.  History of present illness: He was diagnosed with seropositive myasthenia gravis in December 2013 by Dr. Berdine Addison after presenting with gradual onset of double vision, difficulty swallowing, and talking.  He was started on mestinon and noticed a dramatic improvement. He was also taking prednisone 40mg  daily and reduced it over the years. He has remained asymptomatic and on prednisone 10mg  daily and mestinon 60mg  four times daily (7am, 11am, 4pm, 9pm). He has never been hospitalized for myasthenia or had MG crisis.   He established care with me in September 2016.   I attempted to reduce his mestinon and prednisone. He was doing well on prednisone 10mg  and 5mg  alternating days, however in March 2017, he developed dyspnea and neck stiffness. He was evaluated by Dr. Melvyn Novas on 3/16 for productive cough and shortness of breath and started on Levaquin for acute asthmatic bronchitis.  His ex-wife states that he is having anxiety spells with shortness of breath about 6 times per day. Because of concerns for worsening MG, he was placed on prednisone 60mg  briefly with a weekly taper schedule.  In August 2017, he started a vegan diet.    Over the past several years, he has continued to have exertional fatigue and self-diagnosed with chronic fatigue syndrome.   He has tried a number of therapies looking for the cause of his fatigue including being evaluated by endocrinology, hematology, and has two fillings removed, and added UV light to his home which apparently kills germs.   UPDATE 09/25/2018:  He is here for follow-up  visit.  He remains on prednisone 5mg  and mestinon 60mg  daily which controls his myasthenia.  He does not have double vision, ptosis, difficulty swallow/talking.  He continues to have exertional shortness of breath and tires very quickly.  He was admitted to the hospital with NSTEMI on 11/20 and underwent PCI with stent placement to proximal RCA.  He has a LAD CTO which will be managed electively.    Medications:  Current Outpatient Medications on File Prior to Visit  Medication Sig Dispense Refill  . acetaminophen (TYLENOL) 325 MG tablet Take 2 tablets (650 mg total) by mouth every 4 (four) hours as needed for headache or mild pain.    Marland Kitchen aspirin EC 81 MG EC tablet Take 1 tablet (81 mg total) by mouth daily.    . busPIRone (BUSPAR) 5 MG tablet Take 5 mg by mouth 2 (two) times daily.  0  . clopidogrel (PLAVIX) 75 MG tablet Take 1 tablet (75 mg total) by mouth daily. 90 tablet 2  . ezetimibe (ZETIA) 10 MG tablet Take 1 tablet (10 mg total) by mouth daily. 30 tablet 6  . furosemide (LASIX) 40 MG tablet Take (20mg ) half tab Monday, Wednesday and Friday and (40mg ) 1 tablet Tuesday, Thursday, Sat and Sun 90 tablet 2  . levothyroxine (SYNTHROID, LEVOTHROID) 100 MCG tablet Take 100 mcg by mouth daily.   3  . losartan (COZAAR) 50 MG tablet Take 1 tablet (50 mg total) by mouth daily. 30 tablet 6  . nitroGLYCERIN (NITROSTAT) 0.4 MG SL tablet Place 1 tablet (0.4 mg total) under the tongue every  5 (five) minutes x 3 doses as needed for chest pain. 25 tablet 4   No current facility-administered medications on file prior to visit.     Allergies:  Allergies  Allergen Reactions  . Beta Adrenergic Blockers Hives  . Statins Hives    Review of Systems:  CONSTITUTIONAL: No fevers, chills, night sweats, + intentional weight loss.  EYES: No visual changes or eye pain ENT: No hearing changes.  No history of nose bleeds.   RESPIRATORY: No cough, wheezing or shortness of breath.   CARDIOVASCULAR: Negative for  chest pain, and palpitations.   GI: Negative for abdominal discomfort, blood in stools or black stools.  No recent change in bowel habits.   GU:  No history of incontinence.   MUSCLOSKELETAL: No history of joint pain or swelling.  No myalgias.   SKIN: Negative for lesions, rash, and itching.   ENDOCRINE: Negative for cold or heat intolerance, polydipsia or goiter.   PSYCH:  No depression +anxiety symptoms.   NEURO: As Above.   Vital Signs:  BP 140/80   Pulse 60   Ht 5\' 8"  (1.727 m)   Wt 155 lb (70.3 kg)   SpO2 100%   BMI 23.57 kg/m   General Medical Exam:   General:  Well appearing, comfortable  Eyes/ENT: see cranial nerve examination.   Neck:  No carotid bruits. Respiratory:  Clear to auscultation, good air entry bilaterally.   Cardiac:  Regular rate and rhythm, no murmur.   Ext:  No edema  Neurological Exam: MENTAL STATUS including orientation to time, place, person is intact.  Speech is not dysarthric.    CRANIAL NERVES:  Pupils are round and reactive to light.  Extraocular muscles intact.  No ptosis, no worsening with sustained upgaze.  No facial weakness.  Orbicularis oculi, orbicularis oris, and buccinator is 5/5.  Tongue is midline and motor strength is 5/5.  MOTOR:  Motor strength is 5/5 in all extremities.  There is no fatigability of muscles.  COORDINATION/GAIT:  He is able to rise to stand without using arms.  Gait is stable, narrow-based, and unassisted.  Data: Labs 01/19/2014: Vitamin B6 134 Labs 08/16/2014: AChR binding 7.2*, AChR blocking 46%, AChR modulating 42%   IMPRESSION/PLAN: 1.  Seropositive myasthenia gravis (predominately bulbar symptoms), diagnosed in 2013. No signs of exacerbation.  Clinically doing well on prednisone 5mg  and mestinon 60mg  daily which will be continued.    2.  Recent NSTEMI in November 2019 s/p DES to RCA, with LAD occlusion being managed medically and possible intervention to CTO.  It is very likely that his chronic exertional  fatigue has been stemming from CAD.  There is no contraindication for him to be on statin therapy for myasthenia standpoint.   Return to clinic in 6 months  Thank you for allowing me to participate in patient's care.  If I can answer any additional questions, I would be pleased to do so.    Sincerely,    Donika K. Posey Pronto, DO

## 2018-09-26 ENCOUNTER — Ambulatory Visit (INDEPENDENT_AMBULATORY_CARE_PROVIDER_SITE_OTHER): Payer: Medicare Other | Admitting: Neurology

## 2018-09-26 ENCOUNTER — Encounter: Payer: Self-pay | Admitting: Neurology

## 2018-09-26 VITALS — BP 140/80 | HR 60 | Ht 68.0 in | Wt 155.0 lb

## 2018-09-26 DIAGNOSIS — G7000 Myasthenia gravis without (acute) exacerbation: Secondary | ICD-10-CM

## 2018-09-26 MED ORDER — PYRIDOSTIGMINE BROMIDE 60 MG PO TABS: 60.0000 mg | ORAL_TABLET | Freq: Every day | ORAL | 3 refills | Status: DC

## 2018-09-26 MED ORDER — PREDNISONE 5 MG PO TABS: 5.0000 mg | ORAL_TABLET | Freq: Every day | ORAL | 3 refills | Status: DC

## 2018-09-26 NOTE — Patient Instructions (Addendum)
Wishing you a healthy and peaceful 2020!  Continue prednisone 5mg  daily and mestinon 60mg  daily  Return to clinic in 6 months

## 2018-09-28 NOTE — Progress Notes (Signed)
Cardiology Office Note   Date:  09/29/2018   ID:  Marcus Kaufmann Sr., DOB Oct 02, 1931, MRN [id number]  PCP:  Marcus Bowen, MD  Cardiologist:   Marcus Milliner Martinique, MD   Chief Complaint  Patient presents with  . Coronary Artery Disease      History of Present Illness: Marcus A Everage Sr. is a 84 y.o. male who is seen for follow up CAD. He has a history of myasthenia gravis and remote PAF in the setting of pneumonia July 2016.  He presented 08/20/2018 with a non-ST elevation MI.  08/21/2018 he underwent diagnostic catheterization and intervention with a DES to his RCA.   Patient did have a residual total LAD that Dr [name] attempted to cross but it appeared the wire got subintimal and so the procedure was aborted. He also has a 60% circumflex.  Echocardiogram showed an EF of 40% with moderate to severe MR.  Though his LVEDP at cath was normal he was felt to be volume overloaded.  He was diuresed and sent home on a diuretic as well.    On initial follow up he complained of persistent SOB. Was seen by Dr. Melvyn Novas who felt this was not a pulmonary issue. Later switched from Brilinta to Plavix with significant improvement in symptoms. He was considered for potential CTO PCI of the LAD. On follow up today he is seen with his daughter. He reports BP at home 130/70. He denies any current dyspnea or chest pain. Reports he quit smoking 40 years ago. Has been following a Statistician Dr. Cyd Silence type diet for the last 2 years. Reports some type of immune response to statins and/or beta blockers in the past that required him to be on steroids for 2 years. Refuses to take Zetia based on what he has seen on the internet. He does have chronic lightheadedness but this preceeded his MI.    Past Medical History:  Diagnosis Date  . Acute on chronic combined systolic and diastolic CHF (congestive heart failure) ([place]) 08/22/2018  . Acute on chronic respiratory failure ([place]) 04/15/2015  . CAD in native artery  08/22/2018  . CAP (community acquired pneumonia) 04/13/2015   See cxr 04/13/2015 > admit [facility]    . H/O hiatal hernia   . Hyperlipidemia LDL goal <70 08/22/2018  . Hypertension 08/22/2018  . Hypothyroidism 04/13/2015  . Myasthenia gravis ([place]) 04/13/2015  . Obesity 04/13/2015  . Paroxysmal A-fib ([place]) 04/16/2015  . S/P angioplasty with stent 08/21/18 DES to RCA 08/22/2018  . Sepsis ([place]) 04/15/2015    Past Surgical History:  Procedure Laterality Date  . AMPUTATION  06/11/2012   Procedure: AMPUTATION DIGIT;  Surgeon: [name], MD;  Location: [facility];  Service: Orthopedics;  Laterality: Left;  revision of amputation  . CORONARY STENT INTERVENTION N/A 08/21/2018   Procedure: CORONARY STENT INTERVENTION;  Surgeon: [name], MD;  Location: [facility];  Service: Cardiovascular;  Laterality: N/A;  . HERNIA REPAIR  1994  . LEFT HEART CATH AND CORONARY ANGIOGRAPHY N/A 08/21/2018   Procedure: LEFT HEART CATH AND CORONARY ANGIOGRAPHY;  Surgeon: [name], MD;  Location: [facility];  Service: Cardiovascular;  Laterality: N/A;  . ORIF SHOULDER FRACTURE  06/13/2012   Procedure: OPEN REDUCTION INTERNAL FIXATION (ORIF) SHOULDER FRACTURE;  Surgeon: [name], MD;  Location: [facility];  Service: Orthopedics;  Laterality: Left;  LEFT SHOULDER OPEN GREATER TUBEROSITY ORIF  . SHOULDER CLOSED REDUCTION  06/11/2012   Procedure:  CLOSED MANIPULATION SHOULDER;  Surgeon: [name], MD;  Location: [facility];  Service: Orthopedics;  Laterality: Left;     Current Outpatient Medications  Medication Sig Dispense Refill  . aspirin EC 81 MG EC tablet Take 1 tablet (81 mg total) by mouth daily.    . busPIRone (BUSPAR) 5 MG tablet Take 5 mg by mouth 2 (two) times daily.  0  . clopidogrel (PLAVIX) 75 MG tablet Take 1 tablet (75 mg total) by mouth daily. 90 tablet 2  . furosemide (LASIX) 40 MG tablet Take (56m) half tab Monday, Wednesday and Friday and (471m 1 tablet Tuesday, Thursday, Sat  and Sun 90 tablet 2  . levothyroxine (SYNTHROID, LEVOTHROID) 100 MCG tablet Take 100 mcg by mouth daily.   3  . losartan (COZAAR) 50 MG tablet Take 1 tablet (50 mg total) by mouth daily. 30 tablet 6  . nitroGLYCERIN (NITROSTAT) 0.4 MG SL tablet Place 1 tablet (0.4 mg total) under the tongue every 5 (five) minutes x 3 doses as needed for chest pain. 25 tablet 4  . predniSONE (DELTASONE) 5 MG tablet Take 1 tablet (5 mg total) by mouth daily with breakfast. Take extra tablet as needed 90 tablet 3  . pyridostigmine (MESTINON) 60 MG tablet Take 1 tablet (60 mg total) by mouth daily. 90 tablet 3   No current facility-administered medications for this visit.     Allergies:   Beta adrenergic blockers and Statins    Social History:  The patient  reports that he quit smoking about 36 years ago. His smoking use included cigarettes. He has a 30.00 pack-year smoking history. He has never used smokeless tobacco. He reports current alcohol use of about 2.0 standard drinks of alcohol per week. He reports that he does not use drugs.   Family History:  The patient's family history includes Healthy in his daughter, sister, and son; Heart attack in his father; Other in his mother.    ROS:  Please see the history of present illness.   Otherwise, review of systems are positive for none.   All other systems are reviewed and negative.    PHYSICAL EXAM: VS:  BP (!) 162/72   Pulse 67   Ht 5' 8"  (1.727 m)   Wt 156 lb (70.8 kg)   BMI 23.72 kg/m  , BMI Body mass index is 23.72 kg/m. GEN: Well nourished, well developed, in no acute distress  HEENT: normal  Neck: no JVD, carotid bruits, or masses Cardiac: RRR; no murmurs, rubs, or gallops,no edema  Respiratory:  clear to auscultation bilaterally, normal work of breathing GI: soft, nontender, nondistended, + BS MS: no deformity or atrophy  Skin: warm and dry, no rash Neuro:  Strength and sensation are intact Psych: euthymic mood, full affect   EKG:  EKG is  not ordered today. The ekg ordered today demonstrates N/A   Recent Labs: 09/01/2018: BUN 25; Creatinine, Ser 1.24; Hemoglobin 14.2; Platelets 127.0; Potassium 4.6; Pro B Natriuretic peptide (BNP) 949.0; Sodium 135    Lipid Panel    Component Value Date/Time   CHOL 168 08/21/2018 0924   TRIG 56 08/21/2018 0924   HDL 61 08/21/2018 0924   CHOLHDL 2.8 08/21/2018 0924   VLDL 11 08/21/2018 0924   LDLCALC 96 08/21/2018 0924      Wt Readings from Last 3 Encounters:  09/29/18 156 lb (70.8 kg)  09/26/18 155 lb (70.3 kg)  09/02/18 147 lb (66.7 kg)      Other studies Reviewed: Additional  studies/ records that were reviewed today include:   Echo 10/22/16: Study Conclusions  - Left ventricle: The cavity size was normal. There was moderate   concentric hypertrophy. Systolic function was normal. The   estimated ejection fraction was in the range of 55% to 60%. Wall   motion was normal; there were no regional wall motion   abnormalities. Features are consistent with a pseudonormal left   ventricular filling pattern, with concomitant abnormal relaxation   and increased filling pressure (grade 2 diastolic dysfunction). - Aortic valve: There was mild regurgitation. Valve area (VTI):   2.17 cm^2. - Mitral valve: Calcified annulus. There was mild regurgitation. - Left atrium: The atrium was moderately dilated. - Pulmonary arteries: PA peak pressure: 32 mm Hg (S).   Echo 08/21/18: Study Conclusions  - Left ventricle: The cavity size was normal. Wall thickness was   increased in a pattern of mild LVH. Mid to apical inferior   hypokinesis. Hypokinesis of the apex. The estimated ejection   fraction was 45%. Features are consistent with a pseudonormal   left ventricular filling pattern, with concomitant abnormal   relaxation and increased filling pressure (grade 2 diastolic   dysfunction). - Aortic valve: Trileaflet; moderately calcified leaflets. There   was mild stenosis. There was  moderate eccentric regurgitation.   Mean gradient (S): 10 mm Hg. Valve area (VTI): 1.73 cm^2. - Mitral valve: Moderately calcified annulus. Mildly calcified   leaflets. There is some degree of restriction of the posterior   leaflet. There was moderate to severe regurgitation. - Left atrium: The atrium was moderately dilated. - Right ventricle: The cavity size was normal. Systolic function   was normal. - Right atrium: The atrium was moderately to severely dilated. - Tricuspid valve: There was moderate regurgitation. Peak RV-RA   gradient 39 mmHg. - Pulmonary arteries: PA peak pressure: 47 mm Hg (S). - Systemic veins: IVC measured 2.5 cm with > 50% respirophasic   variation, suggesting RA pressure 8 mmHg.  Impressions:  - Normal LV size with mild LV hypertrophy. EF 45% with mid to   apical inferior hypokinesis and hypokinesis of the apex. Moderate   diastolic dysfunction. Normal RV size and systolic function.   Biatrial enlargement. Moderate to severe mitral regurgitation   with restriction of the posterior leaflet. Mild aortic stenosis   with moderate eccentric regurgitation. Mild pulmonary   hypertension.   Cardiac cath/PCI: 08/21/18: CORONARY STENT INTERVENTION  LEFT HEART CATH AND CORONARY ANGIOGRAPHY  Conclusion     Prox RCA lesion is 95% stenosed.  Ost Cx lesion is 60% stenosed.  Prox Cx lesion is 60% stenosed.  Prox LAD lesion is 100% stenosed.  A drug-eluting stent was successfully placed.  Post intervention, there is a 0% residual stenosis.  There is moderate left ventricular systolic dysfunction.  LV end diastolic pressure is mildly elevated.  The left ventricular ejection fraction is 35-45% by visual estimate.   Norris KORIN HARTWELL Sr. is a 84 y.o. male    [id number] LOCATION:  FACILITY: [facility]  PHYSICIAN: Quay Burow, M.D. 10/29/1931   DATE OF PROCEDURE:  08/21/2018  DATE OF DISCHARGE:     CARDIAC CATHETERIZATION / PCI DES  RCA    History obtained from chart review.Mr. Olden is an 73M with paroxysmal atrial fibrillation,myastheniagravis, hyperlipidemia and thrombocytopenia here with NSTEMI.    PROCEDURE DESCRIPTION:   The patient was brought to the second floor [facility] cath lab in the postabsorptive state. He was premedicated with IV Versed and fentanyl. His  right wristwas prepped and shaved in usual sterile fashion. Xylocaine 1% was used for local anesthesia. A 6 French sheath was inserted into the right radial artery using standard Seldinger technique. The patient received 4000units  of heparin intravenously.  A 5 [place] TIG catheter and pigtail catheters were used for selective coronary angiography and left ventriculography respectively.  Isovue dye was used for the entirety of the case.  Retrograde aorta, left ventricular and pullback pressures were recorded.  Radial cocktail was administered via the SideArm sheath.  Patient received Brilinta 180 mg p.o. followed by Angiomax bolus and infusion with a therapeutic ACT demonstrated.  Using a 6 [place] JR4 guide catheter, 0.14 pro-water guidewire and a 2 mm x 12 mm balloon predilatation was performed to the proximal dominant RCA.  I then placed a 3 mm x 60 mm long Synergy drug-eluting stent across the diseased segment and deployed at 14 to 16 atm.  I then postdilated with a 3.25 x 12 mm long noncompliant balloon up to 16 atm (3.3 mm) resulting reduction with 95% ulcerated proximal dominant RCA plaque to 0% residual.  There was distal spasm resulting in ST segment elevation which resolved with administration of intracoronary nitroglycerin.  I then attempted to cross the mid LAD CTO using a 6 [place] XB LAD 4 cm curve guide catheter, pro-water and whisper wire and 1.5 mm balloon.  I did ultimately cross the lesion but I did not think I was intraluminal and ultimately aborted the procedure.   IMPRESSION: Successful proximal RCA PCI and drug-eluting  stenting using a synergy drug-eluting stent postdilated to 3.3 mm with unsuccessful attempt at crossing mid LAD CTO.  His LV function is moderately reduced in the 40% range.  His LAD CTO can be treated in a staged fashion electively.  The sheath was removed and a TR band was placed on the right wrist to achieve patent hemostasis.  The patient left the lab in stable condition.  Quay Burow. MD, [facility] 08/21/2018 5:20 PM     Recommend uninterrupted dual antiplatelet therapy with Aspirin 67m daily and Ticagrelor 941mtwice daily for a minimum of 12 months (ACS - Class I recommendation).     ASSESSMENT AND PLAN:  1.  CAD with recent NSTEMI in November secondary to ruptured plaque lesion in the RCA. S/p DES. He has CTO of the mid LAD but is completely asymptomatic. Review of cath and Echo data show wall motion abnormality limited to the inferior wall. Given lack of symptoms or LV dysfunction in the anterior wall I do not see an indication to attempt CTO PCI of the LAD. Will continue DAPT with ASA and Plavix for one year. Intolerant of Brilinta. Given prior reaction apparently to statins will focus on dietary modification for now. Repeat lipids in a couple of months. Consider a PCSK 9 inhibitor if not at goal. 2. LV dysfunction acutely at time of MI. Continue ARB. Euvolemic. No beta blocker due to history of intolerance. 3. HTN. Per home BP readings this is controlled. Will monitor at Cardiac Rehab. If needed can increase losartan. 4. Myastenia gravis in remission 5. Hypothyroidism.    Current medicines are reviewed at length with the patient today.  The patient does not have concerns regarding medicines.  The following changes have been made:  no change  Labs/ tests ordered today include:   Orders Placed This Encounter  Procedures  . AMB referral to cardiac rehabilitation     Disposition:   FU with me in 3 months.  Signed, Tiffine Henigan Martinique, MD  09/29/2018 5:24 PM    [facility] [place] [address] [place], [place], [address] Phone [phone], Fax [phone]

## 2018-09-29 ENCOUNTER — Encounter: Payer: Self-pay | Admitting: Cardiology

## 2018-09-29 ENCOUNTER — Ambulatory Visit (INDEPENDENT_AMBULATORY_CARE_PROVIDER_SITE_OTHER): Payer: Medicare Other | Admitting: Cardiology

## 2018-09-29 VITALS — BP 162/72 | HR 67 | Ht 68.0 in | Wt 156.0 lb

## 2018-09-29 DIAGNOSIS — I10 Essential (primary) hypertension: Secondary | ICD-10-CM | POA: Diagnosis not present

## 2018-09-29 DIAGNOSIS — I5022 Chronic systolic (congestive) heart failure: Secondary | ICD-10-CM

## 2018-09-29 DIAGNOSIS — I2510 Atherosclerotic heart disease of native coronary artery without angina pectoris: Secondary | ICD-10-CM

## 2018-09-29 DIAGNOSIS — I480 Paroxysmal atrial fibrillation: Secondary | ICD-10-CM | POA: Diagnosis not present

## 2018-09-29 DIAGNOSIS — I5042 Chronic combined systolic (congestive) and diastolic (congestive) heart failure: Secondary | ICD-10-CM | POA: Insufficient documentation

## 2018-09-29 NOTE — Patient Instructions (Signed)
Medication Instructions:   Continue same medications   If you need a refill on your cardiac medications before your next appointment, please call your pharmacy.   Lab work:  None ordered   Testing/Procedures:  Cardiac Rehab Phase 2  Cardiac Rehab will call with appointment  Follow-Up: At [facility], you and your health needs are our priority.  As part of our continuing mission to provide you with exceptional heart care, we have created designated Provider Care Teams.  These Care Teams include your primary Cardiologist (physician) and Advanced Practice Providers (APPs -  Physician Assistants and Nurse Practitioners) who all work together to provide you with the care you need, when you need it. Marland Kitchen Appointment scheduled with Dr.[name] Wednesday 12/24/18 at 10:00 am

## 2018-10-01 ENCOUNTER — Other Ambulatory Visit: Payer: Self-pay | Admitting: Cardiology

## 2018-10-03 DIAGNOSIS — E7849 Other hyperlipidemia: Secondary | ICD-10-CM | POA: Diagnosis not present

## 2018-10-03 DIAGNOSIS — E038 Other specified hypothyroidism: Secondary | ICD-10-CM | POA: Diagnosis not present

## 2018-10-03 DIAGNOSIS — R82998 Other abnormal findings in urine: Secondary | ICD-10-CM | POA: Diagnosis not present

## 2018-10-03 DIAGNOSIS — Z0000 Encounter for general adult medical examination without abnormal findings: Secondary | ICD-10-CM | POA: Insufficient documentation

## 2018-10-03 DIAGNOSIS — Z125 Encounter for screening for malignant neoplasm of prostate: Secondary | ICD-10-CM | POA: Diagnosis not present

## 2018-10-06 ENCOUNTER — Other Ambulatory Visit: Payer: Self-pay | Admitting: Neurology

## 2018-10-07 ENCOUNTER — Telehealth (HOSPITAL_COMMUNITY): Payer: Self-pay

## 2018-10-07 ENCOUNTER — Ambulatory Visit: Payer: Medicare Other | Admitting: Internal Medicine

## 2018-10-07 NOTE — Telephone Encounter (Signed)
Reopen referral#[id number]  Closed referral

## 2018-10-07 NOTE — Telephone Encounter (Signed)
Another referral was put in on f/u visit with doctor.  Attempted to call patient in regards to Cardiac Rehab - LM on VM

## 2018-10-09 ENCOUNTER — Ambulatory Visit (INDEPENDENT_AMBULATORY_CARE_PROVIDER_SITE_OTHER): Payer: Medicare Other | Admitting: Internal Medicine

## 2018-10-09 ENCOUNTER — Encounter: Payer: Self-pay | Admitting: Internal Medicine

## 2018-10-09 DIAGNOSIS — R0609 Other forms of dyspnea: Secondary | ICD-10-CM | POA: Diagnosis not present

## 2018-10-09 NOTE — Progress Notes (Signed)
Subjective:     Patient ID: Marcus [name], male   DOB: 12/07/1931     MRN: [id number]    Brief patient profile:  52   yowm   MG on daily prednisone since 2013 quit smoking around 1976 with pfts wnl 12/31/16 but suspected component of AB suppressed on chronic pred for MG    History of Present Illness   12/15/2015 acute extended ov/Artesia Berkey re: cough /sob on prednisone 20 x one week  prior to [place]  Patient presents with  . Acute Visit    Pt c/o sob and cough x 3 days. Cough is prod with white sputum.   acute onset s travel this episode, comfortable at rest though more difficult in supine position due to sob/cough  last night first time this happened since dx of CAP but comfortable at rest sitting. rec dulera 100 Take 2 puffs first thing in am and then another 2 puffs about 12 hours later.  For cough / congestion mucinex dm 1200 mg every 12 hours Levaquin 500mg  daily x 7 days   05/19/2018  f/u ov/Willia Genrich re:  MG / no longer on resp rx at all  Chief Complaint  Patient presents with  . Follow-up    He is doing well at this time no symptoms.  Dyspnea:  Walking up to a quarter mile nl pace /   able to work out a gym 6 x weekly just wts/ not sob Cough: none Sleeping: on side 30 degrees bunch of pillows  rec Incentive spirometry at least twice daily - set the leveler at the first effort and beat it on the next 3 efforts    NP eval 08/12/18 "chest burning" Prednisone 10mg  tablet  >>> 2 tabs for 5 days (20mg  daily), then 1 tab for 5 days (10mg  daily), then resume 5 mg daily If not improving please contact our office or present to ER     08/19/2018 acute extended ov/[name] re: chest burning  Chief Complaint  Patient presents with  . Acute Visit    Still has "burning in lungs"- notices after exertionand he finds it uncomfortable to lie down.  He also c/o hoarseness today.   onset 08/09/18 while exerting gen ant chest burning assoc with doe and breathing got better at rest  but  burning continued, non radiating   s n or v or diaphoresis and described as constant =sitting / supine resolved completely p taking  Pm ativan 08/17/18 and when woke up  100% better am 08/18/18 and stayed better until am of ov then  woke up with burning pain not related to breathing but this time just isolated to L parasternal where previously was bilateral.  No assoc cough/ no pain brought on by cough or deep breath or walking or relation to meals.  No flare of weakness from MG, no better on prednisone rx. rec Protonix 40 mg Take 30- 60 min before your first and last meals of the day  (or pepcid 20 mg otc twice daily after meals, though this is not as effective as protonix)  Continue prednisone 5 mg daily  GERD  diet  check labs > c/w chf  / NSTEMI  > to ER > stent done  RCA/ ef 35-45 % with LVEDP 18 p diuresis    09/01/2018 acute extended ov/[name] re: ? Recurrent sob s/p stent  Chief Complaint  Patient presents with  . Acute Visit    had cardiac stent placed 08/21/18- since then  his breathing has been progressively worse. He is scheduled to see cards 09/02/18.  by the time of d/c from stents walking halls and at home did some hills slow pace s sob w/in 24 h of discharge but none since. Convinced the brilinata is making him sob   Gradually worse across doe x across the house steadily down since day of d/c  No cough / pos voice raspy/ no typical  MG symptoms - no sob at rest and no orthopnea nor recurrent cp  rec Please remember to go to the lab department   for your tests - we will call you with the results when they are available. If not better, start protonix 40 mg Take 30- 60 min before your first and last meals of the day or pepcid 20 mg after bfast and supper.   10/09/2018  f/u ov/[name] re: prednisone 5mg  MG  Chief Complaint  Patient presents with  . Follow-up    Breathing has improved and he no longer has the burning sensation like before. He c/o feeling light headed all the time.    Dyspnea:    MMRC2 = can't walk a nl pace on a flat grade s sob but does fine slow and flat   Cough: none Sleeping: bed flat/ pillows get him to 20 egrees  SABA use: none  02: none    No obvious day to day or daytime variability or assoc excess/ purulent sputum or mucus plugs or hemoptysis or cp or chest tightness, subjective wheeze or overt sinus or hb symptoms.   Sleeping as above  without nocturnal  or early am exacerbation  of respiratory  c/o's or need for noct saba. Also denies any obvious fluctuation of symptoms with weather or environmental changes or other aggravating or alleviating factors except as outlined above   No unusual exposure hx or h/o childhood pna/ asthma or knowledge of premature birth.  Current Allergies, Complete Past Medical History, Past Surgical History, Family History, and Social History were reviewed in [employer] record.  ROS  The following are not active complaints unless bolded Hoarseness, sore throat, dysphagia, dental problems, itching, sneezing,  nasal congestion or discharge of excess mucus or purulent secretions, ear ache,   fever, chills, sweats, unintended wt loss or wt gain, classically pleuritic or exertional cp,  orthopnea pnd or arm/hand swelling  or leg swelling, presyncope, palpitations, abdominal pain, anorexia, nausea, vomiting, diarrhea  or change in bowel habits or change in bladder habits, change in stools or change in urine, dysuria, hematuria,  rash, arthralgias, visual complaints, headache, numbness, weakness/ light headed always happens while standing "for a while" and not typically immediately on standing  or ataxia or problems with walking or coordination,  change in mood or  memory.        Current Meds  Medication Sig  . aspirin EC 81 MG EC tablet Take 1 tablet (81 mg total) by mouth daily.  . busPIRone (BUSPAR) 5 MG tablet Take 5 mg by mouth 2 (two) times daily.  . clopidogrel (PLAVIX) 75 MG tablet TAKE 4  TABLETS BY MOUTH TOMORROW(STARTING DOSE), THEN FURTHERMORE TAKE 1 TABLET BY MOUTH DAILY  . furosemide (LASIX) 40 MG tablet Take (20mg ) half tab Monday, Wednesday and Friday and (40mg ) 1 tablet Tuesday, Thursday, Sat and Sun  . levothyroxine (SYNTHROID, LEVOTHROID) 100 MCG tablet Take 100 mcg by mouth daily.   Marland Kitchen losartan (COZAAR) 50 MG tablet Take 1 tablet (50 mg total) by mouth daily.  Marland Kitchen  nitroGLYCERIN (NITROSTAT) 0.4 MG SL tablet Place 1 tablet (0.4 mg total) under the tongue every 5 (five) minutes x 3 doses as needed for chest pain.  . predniSONE (DELTASONE) 5 MG tablet TAKE 1 TABLET BY MOUTH DAILY WITH BREAKFAST. TAKE EXTRA TABLET AS NEEDED  . pyridostigmine (MESTINON) 60 MG tablet Take 1 tablet (60 mg total) by mouth daily.               Objective:   Physical Exam  amb wm nad s orthostatic symptoms today    10/09/2018            156  09/01/2018          146  08/19/2018        168  05/19/2018          163  10/03/2016          185  01/03/2016          209   12/15/2015       206   04/13/15 210 lb 6.4 oz (95.437 kg)  06/13/12 205 lb 7.5 oz (93.2 kg)  06/11/12 202 lb (91.627 kg)    Vital signs reviewed - Note on arrival 02 sats  98% on RA       HEENT: nl dentition, turbinates bilaterally, and oropharynx. Nl external ear canals without cough reflex   NECK :  without JVD/Nodes/TM/ nl carotid upstrokes bilaterally   LUNGS: no acc muscle use,  Nl contour chest which is clear to A and P bilaterally without cough on insp or exp maneuvers   CV:  RRR  no s3 or murmur or increase in P2, and no edema   ABD:  soft and nontender with nl inspiratory excursion in the supine position. No bruits or organomegaly appreciated, bowel sounds nl  MS:  Nl gait/ ext warm without deformities, calf tenderness, cyanosis or clubbing No obvious joint restrictions   SKIN: warm and dry without lesions    NEURO:  alert, approp, nl sensorium with  no motor or cerebellar deficits apparent.                                     Assessment:

## 2018-10-09 NOTE — Patient Instructions (Addendum)
You need to check your blood pressure and pulse  standing both while you are having bad symptoms and when are symptom free  - write them down and talk to your heart doctor about this and how to use /adjust  your lasix dose   No pulmonary problem identified so follow up is as needed

## 2018-10-10 DIAGNOSIS — I2510 Atherosclerotic heart disease of native coronary artery without angina pectoris: Secondary | ICD-10-CM | POA: Diagnosis not present

## 2018-10-10 DIAGNOSIS — N401 Enlarged prostate with lower urinary tract symptoms: Secondary | ICD-10-CM | POA: Diagnosis not present

## 2018-10-10 DIAGNOSIS — I5189 Other ill-defined heart diseases: Secondary | ICD-10-CM | POA: Diagnosis not present

## 2018-10-10 DIAGNOSIS — M899 Disorder of bone, unspecified: Secondary | ICD-10-CM | POA: Insufficient documentation

## 2018-10-10 DIAGNOSIS — D696 Thrombocytopenia, unspecified: Secondary | ICD-10-CM | POA: Diagnosis not present

## 2018-10-10 DIAGNOSIS — Z0000 Encounter for general adult medical examination without abnormal findings: Secondary | ICD-10-CM | POA: Diagnosis not present

## 2018-10-10 DIAGNOSIS — E7849 Other hyperlipidemia: Secondary | ICD-10-CM | POA: Diagnosis not present

## 2018-10-10 DIAGNOSIS — Z6824 Body mass index (BMI) 24.0-24.9, adult: Secondary | ICD-10-CM | POA: Diagnosis not present

## 2018-10-10 DIAGNOSIS — M859 Disorder of bone density and structure, unspecified: Secondary | ICD-10-CM | POA: Diagnosis not present

## 2018-10-10 DIAGNOSIS — E038 Other specified hypothyroidism: Secondary | ICD-10-CM | POA: Diagnosis not present

## 2018-10-10 DIAGNOSIS — I480 Paroxysmal atrial fibrillation: Secondary | ICD-10-CM | POA: Diagnosis not present

## 2018-10-10 DIAGNOSIS — G7000 Myasthenia gravis without (acute) exacerbation: Secondary | ICD-10-CM | POA: Diagnosis not present

## 2018-10-12 ENCOUNTER — Encounter: Payer: Self-pay | Admitting: Internal Medicine

## 2018-10-12 NOTE — Assessment & Plan Note (Signed)
-   spirometry 01/03/2016   FVC  3.91  10/03/2016  Walked RA x 3 laps @ 185 ft each stopped due to  End of study, nl pace, no sob or desat   Min fatigue - Spirometry 10/03/2016  FVC  5.13  But f/v loop not physiologic  - Echo 10/22/2016  Features are consistent with a pseudonormal left ventricular filling pattern, with concomitant abnormal relaxation and increased filling pressure (grade 2 diastolic dysfunction). - Aortic valve: There was mild regurgitation. Valve area (VTI): 2.17 cm^2. - Mitral valve: Calcified annulus. There was mild regurgitation. - Left atrium: The atrium was moderately dilated. - Pulmonary arteries: PA peak pressure: 32 mm Hg (S). PFTs 12/31/16  wnl with FVC 3.46 (96%)  - 08/19/2018  Walked RA x 2 laps @ 261ft each stopped due to  End of study, no sob/no cp/no desats - 09/01/2018   Walked RA x one lap = 210 ft - stopped due to  Sob/ sats still 98%, pulse only 74 and no chest burning   Resolved to his satisfaction but may be having side effects from cardiology meds > avised on recording bp and pulse upright both when feeling bad and when feeling fine to see if there is any correlation   >>>  Pulmonary f/u is prn   Each maintenance medication was reviewed in detail including most importantly the difference between maintenance and as needed and under what circumstances the prns are to be used.  Please see AVS for specific  Instructions which are unique to this visit and I personally typed out  which were reviewed in detail in writing with the patient and a copy provided.

## 2018-10-17 ENCOUNTER — Telehealth (HOSPITAL_COMMUNITY): Payer: Self-pay

## 2018-10-22 ENCOUNTER — Ambulatory Visit (HOSPITAL_COMMUNITY): Payer: Medicare Other

## 2018-10-24 ENCOUNTER — Ambulatory Visit (HOSPITAL_COMMUNITY): Payer: Medicare Other

## 2018-10-27 ENCOUNTER — Ambulatory Visit (HOSPITAL_COMMUNITY): Payer: Medicare Other

## 2018-10-29 ENCOUNTER — Ambulatory Visit (HOSPITAL_COMMUNITY): Payer: Medicare Other

## 2018-10-31 ENCOUNTER — Ambulatory Visit (HOSPITAL_COMMUNITY): Payer: Medicare Other

## 2018-11-03 ENCOUNTER — Ambulatory Visit (HOSPITAL_COMMUNITY): Payer: Medicare Other

## 2018-11-05 ENCOUNTER — Ambulatory Visit (HOSPITAL_COMMUNITY): Payer: Medicare Other

## 2018-11-07 ENCOUNTER — Ambulatory Visit (HOSPITAL_COMMUNITY): Payer: Medicare Other

## 2018-11-10 ENCOUNTER — Ambulatory Visit (HOSPITAL_COMMUNITY): Payer: Medicare Other

## 2018-11-10 ENCOUNTER — Telehealth (HOSPITAL_COMMUNITY): Payer: Self-pay

## 2018-11-10 NOTE — Progress Notes (Signed)
Azhar A Tafolla Sr. 85 y.o. male DOB 1932/02/17 MRN [id number]       Nutrition Screen Note  No diagnosis found. Past Medical History:  Diagnosis Date  . Acute on chronic combined systolic and diastolic CHF (congestive heart failure) ([place]) 08/22/2018  . Acute on chronic respiratory failure ([place]) 04/15/2015  . CAD in native artery 08/22/2018  . CAP (community acquired pneumonia) 04/13/2015   See cxr 04/13/2015 > admit [facility]    . H/O hiatal hernia   . Hyperlipidemia LDL goal <70 08/22/2018  . Hypertension 08/22/2018  . Hypothyroidism 04/13/2015  . Myasthenia gravis ([place]) 04/13/2015  . Obesity 04/13/2015  . Paroxysmal A-fib ([place]) 04/16/2015  . S/P angioplasty with stent 08/21/18 DES to RCA 08/22/2018  . Sepsis ([place]) 04/15/2015   Meds reviewed.    Current Outpatient Medications (Endocrine & Metabolic):  .  levothyroxine (SYNTHROID, LEVOTHROID) 100 MCG tablet, Take 100 mcg by mouth daily.  .  predniSONE (DELTASONE) 5 MG tablet, TAKE 1 TABLET BY MOUTH DAILY WITH BREAKFAST. TAKE EXTRA TABLET AS NEEDED  Current Outpatient Medications (Cardiovascular):  .  furosemide (LASIX) 40 MG tablet, Take (20mg ) half tab Monday, Wednesday and Friday and (40mg ) 1 tablet Tuesday, Thursday, Sat and Sun .  losartan (COZAAR) 50 MG tablet, Take 1 tablet (50 mg total) by mouth daily. .  nitroGLYCERIN (NITROSTAT) 0.4 MG SL tablet, Place 1 tablet (0.4 mg total) under the tongue every 5 (five) minutes x 3 doses as needed for chest pain.   Current Outpatient Medications (Analgesics):  .  aspirin EC 81 MG EC tablet, Take 1 tablet (81 mg total) by mouth daily.  Current Outpatient Medications (Hematological):  .  clopidogrel (PLAVIX) 75 MG tablet, TAKE 4 TABLETS BY MOUTH TOMORROW(STARTING DOSE), THEN FURTHERMORE TAKE 1 TABLET BY MOUTH DAILY  Current Outpatient Medications (Other):  .  busPIRone (BUSPAR) 5 MG tablet, Take 5 mg by mouth 2 (two) times daily. Marland Kitchen  pyridostigmine (MESTINON) 60 MG tablet, Take 1 tablet (60 mg  total) by mouth daily.   HT: Ht Readings from Last 1 Encounters:  10/09/18 5\' 8"  (1.727 m)    WT: Wt Readings from Last 5 Encounters:  10/09/18 156 lb 6.4 oz (70.9 kg)  09/29/18 156 lb (70.8 kg)  09/26/18 155 lb (70.3 kg)  09/02/18 147 lb (66.7 kg)  09/01/18 146 lb (66.2 kg)     BMI = 23.78 10/09/18   Current tobacco use? No       Labs:  Lipid Panel     Component Value Date/Time   CHOL 168 08/21/2018 0924   TRIG 56 08/21/2018 0924   HDL 61 08/21/2018 0924   CHOLHDL 2.8 08/21/2018 0924   VLDL 11 08/21/2018 0924   LDLCALC 96 08/21/2018 0924    No results found for: HGBA1C CBG (last 3)  No results for input(s): GLUCAP in the last 72 hours.  Nutrition Diagnosis ? Food-and nutrition-related knowledge deficit related to lack of exposure to information as related to diagnosis of: ? CVD   Nutrition Goal(s):  ? To be determined  Plan:  Pt to attend nutrition classes ? Nutrition I ? Nutrition II ? Portion Distortion  ? Diabetes Blitz ? Diabetes Q & A Will provide client-centered nutrition education as part of interdisciplinary care.   Monitor and evaluate progress toward nutrition goal with team.  Laurina Bustle, MS, RD, LDN 11/10/2018 7:46 AM

## 2018-11-10 NOTE — Telephone Encounter (Signed)
Cardiac Rehab Medication Review by a Pharmacist  Does the patient  feel that his/her medications are working for him/her?  yes  Has the patient been experiencing any side effects to the medications prescribed?  no  Does the patient measure his/her own blood pressure or blood glucose at home?  Yes; 140/80    Does the patient have any problems obtaining medications due to transportation or finances?   Yes   Understanding of regimen: good Understanding of indications: good Potential of compliance: good    Pharmacist comments: Patient doing well. No concerns.    Azzie Roup D PGY1 Pharmacy Resident  Phone [phone] Please use [facility] for clinical pharmacists numbers  11/10/2018      4:03 PM

## 2018-11-12 ENCOUNTER — Ambulatory Visit (HOSPITAL_COMMUNITY): Payer: Medicare Other

## 2018-11-13 ENCOUNTER — Encounter (HOSPITAL_COMMUNITY)
Admission: RE | Admit: 2018-11-13 | Discharge: 2018-11-13 | Disposition: A | Discharge status: Home / Self Care | Payer: Medicare Other | Source: Ambulatory Visit | Attending: Cardiology | Admitting: Cardiology

## 2018-11-13 VITALS — BP 138/78 | HR 69 | Ht 68.0 in | Wt 166.7 lb

## 2018-11-13 DIAGNOSIS — Z955 Presence of coronary angioplasty implant and graft: Secondary | ICD-10-CM | POA: Diagnosis not present

## 2018-11-13 DIAGNOSIS — I214 Non-ST elevation (NSTEMI) myocardial infarction: Secondary | ICD-10-CM | POA: Insufficient documentation

## 2018-11-13 NOTE — Progress Notes (Signed)
Cardiac Individual Treatment Plan  Patient Details  Name: Marcus AKKERMAN Sr. MRN: [id number] Date of Birth: December 22, 1931 Referring Provider:     CARDIAC REHAB PHASE II ORIENTATION from 11/13/2018 in [facility]  Referring Provider  Martinique, Peter MD       Initial Encounter Date:    CARDIAC REHAB PHASE II ORIENTATION from 11/13/2018 in [facility]  Date  11/13/18      Visit Diagnosis: Status post coronary artery stent placement  NSTEMI (non-ST elevated myocardial infarction) ([place])  Patient's Home Medications on Admission:  Current Outpatient Medications:  .  aspirin EC 81 MG EC tablet, Take 1 tablet (81 mg total) by mouth daily., Disp: , Rfl:  .  busPIRone (BUSPAR) 5 MG tablet, Take 5 mg by mouth 2 (two) times daily., Disp: , Rfl: 0 .  clopidogrel (PLAVIX) 75 MG tablet, TAKE 4 TABLETS BY MOUTH TOMORROW(STARTING DOSE), THEN FURTHERMORE TAKE 1 TABLET BY MOUTH DAILY, Disp: 30 tablet, Rfl: 11 .  furosemide (LASIX) 40 MG tablet, Take (20mg ) half tab Monday, Wednesday and Friday and (40mg ) 1 tablet Tuesday, Thursday, Sat and Sun, Disp: 90 tablet, Rfl: 2 .  levothyroxine (SYNTHROID, LEVOTHROID) 100 MCG tablet, Take 100 mcg by mouth daily. , Disp: , Rfl: 3 .  losartan (COZAAR) 50 MG tablet, Take 1 tablet (50 mg total) by mouth daily., Disp: 30 tablet, Rfl: 6 .  nitroGLYCERIN (NITROSTAT) 0.4 MG SL tablet, Place 1 tablet (0.4 mg total) under the tongue every 5 (five) minutes x 3 doses as needed for chest pain., Disp: 25 tablet, Rfl: 4 .  predniSONE (DELTASONE) 5 MG tablet, TAKE 1 TABLET BY MOUTH DAILY WITH BREAKFAST. TAKE EXTRA TABLET AS NEEDED, Disp: 120 tablet, Rfl: 1 .  pyridostigmine (MESTINON) 60 MG tablet, Take 1 tablet (60 mg total) by mouth daily., Disp: 90 tablet, Rfl: 3  Past Medical History: Past Medical History:  Diagnosis Date  . Acute on chronic combined systolic and diastolic CHF (congestive heart failure) ([place])  08/22/2018  . Acute on chronic respiratory failure ([place]) 04/15/2015  . CAD in native artery 08/22/2018  . CAP (community acquired pneumonia) 04/13/2015   See cxr 04/13/2015 > admit [facility]    . H/O hiatal hernia   . Hyperlipidemia LDL goal <70 08/22/2018  . Hypertension 08/22/2018  . Hypothyroidism 04/13/2015  . Myasthenia gravis ([place]) 04/13/2015  . Obesity 04/13/2015  . Paroxysmal A-fib ([place]) 04/16/2015  . S/P angioplasty with stent 08/21/18 DES to RCA 08/22/2018  . Sepsis ([place]) 04/15/2015    Tobacco Use: Social History   Tobacco Use  Smoking Status Former Smoker  . Packs/day: 1.00  . Years: 30.00  . Pack years: 30.00  . Types: Cigarettes  . Last attempt to quit: 01/10/1982  . Years since quitting: 36.8  Smokeless Tobacco Never Used    Labs: Recent Chemical engineer    Labs for ITP Cardiac and Pulmonary Rehab Latest Ref Rng & Units 06/11/2012 08/21/2018   Cholestrol 0 - 200 mg/dL - 168   LDLCALC 0 - 99 mg/dL - 96   HDL >40 mg/dL - 61   Trlycerides <150 mg/dL - 56   TCO2 0 - 100 mmol/L 19 -      Capillary Blood Glucose: Lab Results  Component Value Date   GLUCAP 98 07/10/2013     Exercise Target Goals: Exercise Program Goal: Individual exercise prescription set using results from initial 6 min walk test and THRR while considering  patient's  activity barriers and safety.   Exercise Prescription Goal: Initial exercise prescription builds to 30-45 minutes a day of aerobic activity, 2-3 days per week.  Home exercise guidelines will be given to patient during program as part of exercise prescription that the participant will acknowledge.  Activity Barriers & Risk Stratification: Activity Barriers & Cardiac Risk Stratification - 11/13/18 1007      Activity Barriers & Cardiac Risk Stratification   Activity Barriers  Other (comment)    Comments  Myathenis Gravis    Cardiac Risk Stratification  High       6 Minute Walk: 6 Minute Walk    Row Name 11/13/18 1005          6 Minute Walk   Phase  Initial     Distance  1419 feet     Walk Time  6 minutes     # of Rest Breaks  0     MPH  2.69     METS  2.54     RPE  11     Perceived Dyspnea   0     VO2 Peak  8.9     Symptoms  No     Resting HR  69 bpm     Resting BP  138/78     Resting Oxygen Saturation   97 %     Exercise Oxygen Saturation  during 6 min walk  98 %     Max Ex. HR  92 bpm     Max Ex. BP  150/80     2 Minute Post BP  128/78        Oxygen Initial Assessment:   Oxygen Re-Evaluation:   Oxygen Discharge (Final Oxygen Re-Evaluation):   Initial Exercise Prescription: Initial Exercise Prescription - 11/13/18 1000      Date of Initial Exercise RX and Referring Provider   Date  11/13/18    Referring Provider  Martinique, Peter MD     Expected Discharge Date  02/20/19      Bike   Level  0.5    Minutes  10    METs  2.26      NuStep   Level  2    SPM  75    Minutes  10    METs  2.3      Track   Laps  9    Minutes  10    METs  2.53      Prescription Details   Frequency (times per week)  3x    Duration  Progress to 30 minutes of continuous aerobic without signs/symptoms of physical distress      Intensity   THRR 40-80% of Max Heartrate  54-109    Ratings of Perceived Exertion  11-13    Perceived Dyspnea  0-4      Progression   Progression  Continue progressive overload as per policy without signs/symptoms or physical distress.      Resistance Training   Training Prescription  Yes    Weight  3lbs    Reps  10-15       Perform Capillary Blood Glucose checks as needed.  Exercise Prescription Changes:   Exercise Comments:   Exercise Goals and Review: Exercise Goals    Row Name 11/13/18 0737             Exercise Goals   Increase Physical Activity  Yes       Intervention  Provide advice, education, support and counseling about physical activity/exercise  needs.;Develop an individualized exercise prescription for aerobic and resistive training based on  initial evaluation findings, risk stratification, comorbidities and participant's personal goals.       Expected Outcomes  Short Term: Attend rehab on a regular basis to increase amount of physical activity.;Long Term: Exercising regularly at least 3-5 days a week.;Long Term: Add in home exercise to make exercise part of routine and to increase amount of physical activity.       Increase Strength and Stamina  Yes       Intervention  Provide advice, education, support and counseling about physical activity/exercise needs.;Develop an individualized exercise prescription for aerobic and resistive training based on initial evaluation findings, risk stratification, comorbidities and participant's personal goals.       Expected Outcomes  Short Term: Increase workloads from initial exercise prescription for resistance, speed, and METs.;Short Term: Perform resistance training exercises routinely during rehab and add in resistance training at home;Long Term: Improve cardiorespiratory fitness, muscular endurance and strength as measured by increased METs and functional capacity (6MWT)       Able to understand and use rate of perceived exertion (RPE) scale  Yes       Intervention  Provide education and explanation on how to use RPE scale       Expected Outcomes  Short Term: Able to use RPE daily in rehab to express subjective intensity level;Long Term:  Able to use RPE to guide intensity level when exercising independently       Knowledge and understanding of Target Heart Rate Range (THRR)  Yes       Intervention  Provide education and explanation of THRR including how the numbers were predicted and where they are located for reference       Expected Outcomes  Short Term: Able to state/look up THRR;Long Term: Able to use THRR to govern intensity when exercising independently;Short Term: Able to use daily as guideline for intensity in rehab       Able to check pulse independently  Yes       Intervention  Provide  education and demonstration on how to check pulse in carotid and radial arteries.;Review the importance of being able to check your own pulse for safety during independent exercise       Expected Outcomes  Short Term: Able to explain why pulse checking is important during independent exercise;Long Term: Able to check pulse independently and accurately       Understanding of Exercise Prescription  Yes       Intervention  Provide education, explanation, and written materials on patient's individual exercise prescription       Expected Outcomes  Short Term: Able to explain program exercise prescription;Long Term: Able to explain home exercise prescription to exercise independently          Exercise Goals Re-Evaluation :   Discharge Exercise Prescription (Final Exercise Prescription Changes):   Nutrition:  Target Goals: Understanding of nutrition guidelines, daily intake of sodium 1500mg , cholesterol 200mg , calories 30% from fat and 7% or less from saturated fats, daily to have 5 or more servings of fruits and vegetables.  Biometrics: Pre Biometrics - 11/13/18 1007      Pre Biometrics   Height  5\' 8"  (1.727 m)    Weight  75.6 kg    Waist Circumference  39 inches    Hip Circumference  37.5 inches    Waist to Hip Ratio  1.04 %    BMI (Calculated)  25.35    Triceps Skinfold  8 mm    % Body Fat  24.3 %    Grip Strength  39 kg    Flexibility  13 in    Single Leg Stand  5.5 seconds        Nutrition Therapy Plan and Nutrition Goals: Nutrition Therapy & Goals - 11/13/18 0839      Nutrition Therapy   Diet  heart healthy      Personal Nutrition Goals   Nutrition Goal  pt continue to eat vegan diet and build ahealthy plate including vegetables, fruits, whole grains, beans and legumes in a heart healthy meal plan    Personal Goal #2  pt to explore new vegan recipes      Intervention Plan   Intervention  Prescribe, educate and counsel regarding individualized specific dietary  modifications aiming towards targeted core components such as weight, hypertension, lipid management, diabetes, heart failure and other comorbidities.    Expected Outcomes  Short Term Goal: Understand basic principles of dietary content, such as calories, fat, sodium, cholesterol and nutrients.;Long Term Goal: Adherence to prescribed nutrition plan.       Nutrition Assessments: Nutrition Assessments - 11/13/18 0840      MEDFICTS Scores   Pre Score  0       Nutrition Goals Re-Evaluation:   Nutrition Goals Re-Evaluation:   Nutrition Goals Discharge (Final Nutrition Goals Re-Evaluation):   Psychosocial: Target Goals: Acknowledge presence or absence of significant depression and/or stress, maximize coping skills, provide positive support system. Participant is able to verbalize types and ability to use techniques and skills needed for reducing stress and depression.  Initial Review & Psychosocial Screening: Initial Psych Review & Screening - 11/13/18 0934      Initial Review   Current issues with  None Identified      Family Dynamics   Good Support System?  Yes   Pt lists his exwife and children as sources of support.       Barriers   Psychosocial barriers to participate in program  There are no identifiable barriers or psychosocial needs.      Screening Interventions   Interventions  Encouraged to exercise       Quality of Life Scores: Quality of Life - 11/13/18 0803      Quality of Life   Select  Quality of Life      Quality of Life Scores   Health/Function Pre  21.15 %    Socioeconomic Pre  30 %    Psych/Spiritual Pre  23.64 %    Family Pre  30 %    GLOBAL Pre  24.68 %      Scores of 19 and below usually indicate a poorer quality of life in these areas.  A difference of  2-3 points is a clinically meaningful difference.  A difference of 2-3 points in the total score of the Quality of Life Index has been associated with significant improvement in overall quality  of life, self-image, physical symptoms, and general health in studies assessing change in quality of life.  PHQ-9: Recent Review Flowsheet Data    There is no flowsheet data to display.     Interpretation of Total Score  Total Score Depression Severity:  1-4 = Minimal depression, 5-9 = Mild depression, 10-14 = Moderate depression, 15-19 = Moderately severe depression, 20-27 = Severe depression   Psychosocial Evaluation and Intervention:   Psychosocial Re-Evaluation:   Psychosocial Discharge (Final Psychosocial Re-Evaluation):   Vocational Rehabilitation: Provide vocational rehab assistance to qualifying candidates.  Vocational Rehab Evaluation & Intervention: Vocational Rehab - 11/13/18 0754      Initial Vocational Rehab Evaluation & Intervention   Assessment shows need for Vocational Rehabilitation  No       Education: Education Goals: Education classes will be provided on a weekly basis, covering required topics. Participant will state understanding/return demonstration of topics presented.  Learning Barriers/Preferences: Learning Barriers/Preferences - 11/13/18 1032      Learning Barriers/Preferences   Learning Barriers  None    Learning Preferences  Verbal Instruction;Skilled Demonstration;Written Material       Education Topics: Count Your Pulse:  -Group instruction provided by verbal instruction, demonstration, patient participation and written materials to support subject.  Instructors address importance of being able to find your pulse and how to count your pulse when at home without a heart monitor.  Patients get hands on experience counting their pulse with staff help and individually.   Heart Attack, Angina, and Risk Factor Modification:  -Group instruction provided by verbal instruction, video, and written materials to support subject.  Instructors address signs and symptoms of angina and heart attacks.    Also discuss risk factors for heart disease and  how to make changes to improve heart health risk factors.   Functional Fitness:  -Group instruction provided by verbal instruction, demonstration, patient participation, and written materials to support subject.  Instructors address safety measures for doing things around the house.  Discuss how to get up and down off the floor, how to pick things up properly, how to safely get out of a chair without assistance, and balance training.   Meditation and Mindfulness:  -Group instruction provided by verbal instruction, patient participation, and written materials to support subject.  Instructor addresses importance of mindfulness and meditation practice to help reduce stress and improve awareness.  Instructor also leads participants through a meditation exercise.    Stretching for Flexibility and Mobility:  -Group instruction provided by verbal instruction, patient participation, and written materials to support subject.  Instructors lead participants through series of stretches that are designed to increase flexibility thus improving mobility.  These stretches are additional exercise for major muscle groups that are typically performed during regular warm up and cool down.   Hands Only CPR:  -Group verbal, video, and participation provides a basic overview of AHA guidelines for community CPR. Role-play of emergencies allow participants the opportunity to practice calling for help and chest compression technique with discussion of AED use.   Hypertension: -Group verbal and written instruction that provides a basic overview of hypertension including the most recent diagnostic guidelines, risk factor reduction with self-care instructions and medication management.    Nutrition I class: Heart Healthy Eating:  -Group instruction provided by PowerPoint slides, verbal discussion, and written materials to support subject matter. The instructor gives an explanation and review of the Therapeutic Lifestyle  Changes diet recommendations, which includes a discussion on lipid goals, dietary fat, sodium, fiber, plant stanol/sterol esters, sugar, and the components of a well-balanced, healthy diet.   Nutrition II class: Lifestyle Skills:  -Group instruction provided by PowerPoint slides, verbal discussion, and written materials to support subject matter. The instructor gives an explanation and review of label reading, grocery shopping for heart health, heart healthy recipe modifications, and ways to make healthier choices when eating out.   Diabetes Question & Answer:  -Group instruction provided by PowerPoint slides, verbal discussion, and written materials to support subject matter. The instructor gives an explanation and review of diabetes co-morbidities, pre- and post-prandial  blood glucose goals, pre-exercise blood glucose goals, signs, symptoms, and treatment of hypoglycemia and hyperglycemia, and foot care basics.   Diabetes Blitz:  -Group instruction provided by PowerPoint slides, verbal discussion, and written materials to support subject matter. The instructor gives an explanation and review of the physiology behind type 1 and type 2 diabetes, diabetes medications and rational behind using different medications, pre- and post-prandial blood glucose recommendations and Hemoglobin A1c goals, diabetes diet, and exercise including blood glucose guidelines for exercising safely.    Portion Distortion:  -Group instruction provided by PowerPoint slides, verbal discussion, written materials, and food models to support subject matter. The instructor gives an explanation of serving size versus portion size, changes in portions sizes over the last 20 years, and what consists of a serving from each food group.   Stress Management:  -Group instruction provided by verbal instruction, video, and written materials to support subject matter.  Instructors review role of stress in heart disease and how to cope  with stress positively.     Exercising on Your Own:  -Group instruction provided by verbal instruction, power point, and written materials to support subject.  Instructors discuss benefits of exercise, components of exercise, frequency and intensity of exercise, and end points for exercise.  Also discuss use of nitroglycerin and activating EMS.  Review options of places to exercise outside of rehab.  Review guidelines for sex with heart disease.   Cardiac Drugs I:  -Group instruction provided by verbal instruction and written materials to support subject.  Instructor reviews cardiac drug classes: antiplatelets, anticoagulants, beta blockers, and statins.  Instructor discusses reasons, side effects, and lifestyle considerations for each drug class.   Cardiac Drugs II:  -Group instruction provided by verbal instruction and written materials to support subject.  Instructor reviews cardiac drug classes: angiotensin converting enzyme inhibitors (ACE-I), angiotensin II receptor blockers (ARBs), nitrates, and calcium channel blockers.  Instructor discusses reasons, side effects, and lifestyle considerations for each drug class.   Anatomy and Physiology of the Circulatory System:  Group verbal and written instruction and models provide basic cardiac anatomy and physiology, with the coronary electrical and arterial systems. Review of: AMI, Angina, Valve disease, Heart Failure, Peripheral Artery Disease, Cardiac Arrhythmia, Pacemakers, and the ICD.   Other Education:  -Group or individual verbal, written, or video instructions that support the educational goals of the cardiac rehab program.   Holiday Eating Survival Tips:  -Group instruction provided by PowerPoint slides, verbal discussion, and written materials to support subject matter. The instructor gives patients tips, tricks, and techniques to help them not only survive but enjoy the holidays despite the onslaught of food that accompanies the  holidays.   Knowledge Questionnaire Score: Knowledge Questionnaire Score - 11/13/18 0747      Knowledge Questionnaire Score   Pre Score  20/24       Core Components/Risk Factors/Patient Goals at Admission: Personal Goals and Risk Factors at Admission - 11/13/18 1033      Core Components/Risk Factors/Patient Goals on Admission    Weight Management  Yes;Weight Loss    Intervention  Weight Management: Develop a combined nutrition and exercise program designed to reach desired caloric intake, while maintaining appropriate intake of nutrient and fiber, sodium and fats, and appropriate energy expenditure required for the weight goal.;Weight Management/Obesity: Establish reasonable short term and long term weight goals.;Weight Management: Provide education and appropriate resources to help participant work on and attain dietary goals.    Admit Weight  166 lb 10.7 oz (75.6  kg)    Goal Weight: Long Term  156 lb (70.8 kg)    Expected Outcomes  Short Term: Continue to assess and modify interventions until short term weight is achieved;Long Term: Adherence to nutrition and physical activity/exercise program aimed toward attainment of established weight goal;Weight Loss: Understanding of general recommendations for a balanced deficit meal plan, which promotes 1-2 lb weight loss per week and includes a negative energy balance of [phone] kcal/d;Understanding recommendations for meals to include 15-35% energy as protein, 25-35% energy from fat, 35-60% energy from carbohydrates, less than 200mg  of dietary cholesterol, 20-35 gm of total fiber daily;Understanding of distribution of calorie intake throughout the day with the consumption of 4-5 meals/snacks    Hypertension  Yes    Intervention  Monitor prescription use compliance.;Provide education on lifestyle modifcations including regular physical activity/exercise, weight management, moderate sodium restriction and increased consumption of fresh fruit,  vegetables, and low fat dairy, alcohol moderation, and smoking cessation.    Expected Outcomes  Short Term: Continued assessment and intervention until BP is < 140/100mm HG in hypertensive participants. < 130/41mm HG in hypertensive participants with diabetes, heart failure or chronic kidney disease.;Long Term: Maintenance of blood pressure at goal levels.    Lipids  Yes    Intervention  Provide education and support for participant on nutrition & aerobic/resistive exercise along with prescribed medications to achieve LDL 70mg , HDL >40mg .    Expected Outcomes  Short Term: Participant states understanding of desired cholesterol values and is compliant with medications prescribed. Participant is following exercise prescription and nutrition guidelines.;Long Term: Cholesterol controlled with medications as prescribed, with individualized exercise RX and with personalized nutrition plan. Value goals: LDL < 70mg , HDL > 40 mg.       Core Components/Risk Factors/Patient Goals Review:    Core Components/Risk Factors/Patient Goals at Discharge (Final Review):    ITP Comments: ITP Comments    Row Name 11/13/18 0737           ITP Comments  Dr. Fransico Him, Medical Director          Comments: Patient attended orientation from [phone] to 0845 to review rules and guidelines for program. Completed 6 minute walk test, Intitial ITP, and exercise prescription.  VSS. Telemetry-SR with BBB as noted on prior 12 Lead EKG.  Asymptomatic.

## 2018-11-13 NOTE — Progress Notes (Signed)
Marcus Jetty Peeks Sr. 85 y.o. male DOB: [birth year] MRN: [id number]      Nutrition Note  1. Status post coronary artery stent placement   2. NSTEMI (non-ST elevated myocardial infarction) [facility])    Past Medical History:  Diagnosis Date  . Acute on chronic combined systolic and diastolic CHF (congestive heart failure) ([place]) 08/22/2018  . Acute on chronic respiratory failure ([place]) 04/15/2015  . CAD in native artery 08/22/2018  . CAP (community acquired pneumonia) 04/13/2015   See cxr 04/13/2015 > admit [facility]    . H/O hiatal hernia   . Hyperlipidemia LDL goal <70 08/22/2018  . Hypertension 08/22/2018  . Hypothyroidism 04/13/2015  . Myasthenia gravis ([place]) 04/13/2015  . Obesity 04/13/2015  . Paroxysmal A-fib ([place]) 04/16/2015  . S/P angioplasty with stent 08/21/18 DES to RCA 08/22/2018  . Sepsis ([place]) 04/15/2015   Meds reviewed.   Current Outpatient Medications (Endocrine & Metabolic):  .  levothyroxine (SYNTHROID, LEVOTHROID) 100 MCG tablet, Take 100 mcg by mouth daily.  .  predniSONE (DELTASONE) 5 MG tablet, TAKE 1 TABLET BY MOUTH DAILY WITH BREAKFAST. TAKE EXTRA TABLET AS NEEDED  Current Outpatient Medications (Cardiovascular):  .  furosemide (LASIX) 40 MG tablet, Take (20mg ) half tab Monday, Wednesday and Friday and (40mg ) 1 tablet Tuesday, Thursday, Sat and Sun .  losartan (COZAAR) 50 MG tablet, Take 1 tablet (50 mg total) by mouth daily. .  nitroGLYCERIN (NITROSTAT) 0.4 MG SL tablet, Place 1 tablet (0.4 mg total) under the tongue every 5 (five) minutes x 3 doses as needed for chest pain.   Current Outpatient Medications (Analgesics):  .  aspirin EC 81 MG EC tablet, Take 1 tablet (81 mg total) by mouth daily.  Current Outpatient Medications (Hematological):  .  clopidogrel (PLAVIX) 75 MG tablet, TAKE 4 TABLETS BY MOUTH TOMORROW(STARTING DOSE), THEN FURTHERMORE TAKE 1 TABLET BY MOUTH DAILY  Current Outpatient Medications (Other):  .  busPIRone (BUSPAR) 5 MG tablet, Take 5 mg by mouth 2  (two) times daily. Marland Kitchen  pyridostigmine (MESTINON) 60 MG tablet, Take 1 tablet (60 mg total) by mouth daily.   HT: Ht Readings from Last 1 Encounters:  11/13/18 5\' 8"  (1.727 m)    WT: Wt Readings from Last 5 Encounters:  11/13/18 166 lb 10.7 oz (75.6 kg)  10/09/18 156 lb 6.4 oz (70.9 kg)  09/29/18 156 lb (70.8 kg)  09/26/18 155 lb (70.3 kg)  09/02/18 147 lb (66.7 kg)     Body mass index is 25.34 kg/m.   Current tobacco use? No  Labs:  Lipid Panel     Component Value Date/Time   CHOL 168 08/21/2018 0924   TRIG 56 08/21/2018 0924   HDL 61 08/21/2018 0924   CHOLHDL 2.8 08/21/2018 0924   VLDL 11 08/21/2018 0924   LDLCALC 96 08/21/2018 0924    No results found for: HGBA1C CBG (last 3)  No results for input(s): GLUCAP in the last 72 hours.  Nutrition Note Spoke with pt. Nutrition plan and goals reviewed with pt. Pt is following Step 1 of the Therapeutic Lifestyle Changes diet, curently vegan and has been eating this way for ~3 years. Pt shared that he doesn't cheat and has set his mind to eating a healthy vegan diet. Pt cooks all his own foods and avoids processed foods. Pt wants to maintain current weight. Heart healthy vegan eating tips reviewed (label reading, how to build a healthy plate, portion sizes, eating frequently across the day). Per discussion, pt does not use canned/convenience  foods often. Pt does not add salt to food. Pt does not eat out frequently.Pt expressed understanding of the information reviewed. Pt aware of nutrition education classes offered and would like to attend nutrition classes.  Nutrition Diagnosis ? Food-and nutrition-related knowledge deficit related to lack of exposure to information as related to diagnosis of: ? CVD   Nutrition Intervention ? Pt's individual nutrition plan and goals reviewed with pt. Pt given handouts for: ? vegan recipes  Nutrition Goal(s):  ? Pt continue to eat vegan diet and build a healthy plate including vegetables,  fruits, whole grains, beans and legumes in a heart healthy meal plan ? Pt to explore new vegan recipes  Plan:  ? Pt to attend nutrition classes:  ? Nutrition I ? Nutrition II ? Portion Distortion ? Will provide client-centered nutrition education as part of interdisciplinary care ? Monitor and evaluate progress toward nutrition goal with team.   Laurina Bustle, MS, RD, LDN 11/13/2018 3:29 PM

## 2018-11-14 ENCOUNTER — Ambulatory Visit (HOSPITAL_COMMUNITY): Payer: Medicare Other

## 2018-11-17 ENCOUNTER — Ambulatory Visit (HOSPITAL_COMMUNITY): Payer: Medicare Other

## 2018-11-19 ENCOUNTER — Ambulatory Visit (HOSPITAL_COMMUNITY): Payer: Medicare Other

## 2018-11-19 ENCOUNTER — Encounter (HOSPITAL_COMMUNITY)
Admission: RE | Admit: 2018-11-19 | Discharge: 2018-11-19 | Disposition: A | Discharge status: Home / Self Care | Payer: Medicare Other | Source: Ambulatory Visit | Attending: Cardiology | Admitting: Cardiology

## 2018-11-19 DIAGNOSIS — Z955 Presence of coronary angioplasty implant and graft: Secondary | ICD-10-CM | POA: Diagnosis not present

## 2018-11-19 DIAGNOSIS — I214 Non-ST elevation (NSTEMI) myocardial infarction: Secondary | ICD-10-CM

## 2018-11-19 NOTE — Progress Notes (Signed)
Marcus A Brinkman Sr. 85 y.o. male Nutrition Note Spoke with pt. Nutrition plan and goals reviewed with pt. Pt is following heart healthy diet. Continues to eat a vegan diet. Since last session pt has explored 2 recipes, and will continue to explore ~1-2 a week. Pt cooks all his own foods and avoids processed foods. Pt shared he wouldn't mind losing ~5 lbs. Discussed with him portion sizes, weighing and measuring servings, and intuitive eating exercises. Heart healthy vegan eating tips from orientation reviewed today(label reading, how to build a healthy plate, portion sizes, eating frequently across the day). Per discussion, pt does not use canned/convenience foods often. Pt does not add salt to food. Pt does not eat out frequently.Pt expressed understanding of the information reviewed. Pt aware of nutrition education classes offered and would like to attend nutrition classes.  No results found for: HGBA1C  Wt Readings from Last 3 Encounters:  11/13/18 166 lb 10.7 oz (75.6 kg)  10/09/18 156 lb 6.4 oz (70.9 kg)  09/29/18 156 lb (70.8 kg)     Nutrition Diagnosis ? Food-and nutrition-related knowledge deficit related to lack of exposure to information as related to diagnosis of: ? CVD   Nutrition Intervention ? Pt's individual nutrition plan and goals reviewed with pt.   Nutrition Goal(s):  ? Pt continue to eat vegan diet and build a healthy plate including vegetables, fruits, whole grains, beans and legumes in a heart healthy meal plan ? Pt to explore new vegan recipes ? Weigh and measure portions ? Practice intuitive eating exercises  Plan:  ? Pt to attend nutrition classes:  ? Nutrition I ? Nutrition II ? Portion Distortion ? Will provide client-centered nutrition education as part of interdisciplinary care ? Monitor and evaluate progress toward nutrition goal with team.  Laurina Bustle, MS, RD, LDN 11/19/2018 10:43 AM

## 2018-11-19 NOTE — Progress Notes (Signed)
Daily Session Note  Patient Details  Name: Marcus SEVIN Sr. MRN: [id number] Date of Birth: [birth year] Referring Provider:     CARDIAC REHAB PHASE II ORIENTATION from 11/13/2018 in [place]  Referring Provider  [place], Peter MD       Encounter Date: 11/19/2018  Check In: Session Check In - 11/19/18 1028      Check-In   Supervising physician immediately available to respond to emergencies  [facility] immediately available    Physician(s)  Dr. Lonny Prude    Location  [facility]    Staff Present  Dorma Russell, MS,ACSM CEP, Exercise Physiologist;Tara Karle Starch, RN, Marga Melnick, RN, Deland Pretty, MS, ACSM CEP, Exercise Physiologist;Joann Rion, RN, BSN    Medication changes reported      No    Fall or balance concerns reported     No    Tobacco Cessation  No Change    Warm-up and Cool-down  Performed as group-led instruction    Resistance Training Performed  No    VAD Patient?  No    PAD/SET Patient?  No      Pain Assessment   Currently in Pain?  No/denies       Capillary Blood Glucose: No results found for this or any previous visit (from the past 24 hour(s)).    Social History   Tobacco Use  Smoking Status Former Smoker  . Packs/day: 1.00  . Years: 30.00  . Pack years: 30.00  . Types: Cigarettes  . Last attempt to quit: 01/10/1982  . Years since quitting: 36.8  Smokeless Tobacco Never Used    Goals Met:  No report of cardiac concerns or symptoms  Goals Unmet:  Not Applicable  Comments: Marcus [name] started cardiac rehab today.  Pt tolerated light exercise without difficulty. VSS, telemetry-Sinus Rhythm, Bundle branch block, asymptomatic.  Medication list reconciled. Pt denies barriers to medicaiton compliance.  PSYCHOSOCIAL ASSESSMENT:  PHQ-0. Pt exhibits positive coping skills, hopeful outlook with supportive family. No psychosocial needs identified at this time, no psychosocial interventions  necessary.    Pt enjoys watching old movies.   Pt oriented to exercise equipment and routine.    Understanding verbalized.[name], RN,BSN 11/19/2018 11:00 AM   Dr. Fransico Him is Medical Director for Cardiac Rehab at [facility].

## 2018-11-21 ENCOUNTER — Ambulatory Visit (HOSPITAL_COMMUNITY): Payer: Medicare Other

## 2018-11-21 ENCOUNTER — Encounter

## 2018-11-21 ENCOUNTER — Ambulatory Visit: Payer: Medicare Other | Admitting: Neurology

## 2018-11-21 ENCOUNTER — Encounter (HOSPITAL_COMMUNITY)
Admission: RE | Admit: 2018-11-21 | Discharge: 2018-11-21 | Disposition: A | Discharge status: Home / Self Care | Payer: Medicare Other | Source: Ambulatory Visit | Attending: Cardiology | Admitting: Cardiology

## 2018-11-21 DIAGNOSIS — I214 Non-ST elevation (NSTEMI) myocardial infarction: Secondary | ICD-10-CM | POA: Diagnosis not present

## 2018-11-21 DIAGNOSIS — Z955 Presence of coronary angioplasty implant and graft: Secondary | ICD-10-CM | POA: Diagnosis not present

## 2018-11-24 ENCOUNTER — Ambulatory Visit (HOSPITAL_COMMUNITY): Payer: Medicare Other

## 2018-11-24 ENCOUNTER — Encounter (HOSPITAL_COMMUNITY)
Admission: RE | Admit: 2018-11-24 | Discharge: 2018-11-24 | Disposition: A | Discharge status: Home / Self Care | Payer: Medicare Other | Source: Ambulatory Visit | Attending: Cardiology | Admitting: Cardiology

## 2018-11-24 DIAGNOSIS — I214 Non-ST elevation (NSTEMI) myocardial infarction: Secondary | ICD-10-CM | POA: Diagnosis not present

## 2018-11-24 DIAGNOSIS — Z955 Presence of coronary angioplasty implant and graft: Secondary | ICD-10-CM

## 2018-11-24 NOTE — Progress Notes (Signed)
Reviewed home exercise guidelines with patient including endpoints, temperature precautions, target heart rate and rate of perceived exertion. Pt plans to walk as his mode of home exercise. Pt voices understanding of instructions given.   Marveen Donlon M Endre Coutts, MS, ACSM CEP  

## 2018-11-26 ENCOUNTER — Ambulatory Visit (HOSPITAL_COMMUNITY): Payer: Medicare Other

## 2018-11-26 ENCOUNTER — Encounter (HOSPITAL_COMMUNITY)
Admission: RE | Admit: 2018-11-26 | Discharge: 2018-11-26 | Disposition: A | Discharge status: Home / Self Care | Payer: Medicare Other | Source: Ambulatory Visit | Attending: Cardiology | Admitting: Cardiology

## 2018-11-26 DIAGNOSIS — I214 Non-ST elevation (NSTEMI) myocardial infarction: Secondary | ICD-10-CM | POA: Diagnosis not present

## 2018-11-26 DIAGNOSIS — Z955 Presence of coronary angioplasty implant and graft: Secondary | ICD-10-CM

## 2018-11-28 ENCOUNTER — Ambulatory Visit (HOSPITAL_COMMUNITY): Payer: Medicare Other

## 2018-11-28 ENCOUNTER — Encounter (HOSPITAL_COMMUNITY)
Admission: RE | Admit: 2018-11-28 | Discharge: 2018-11-28 | Disposition: A | Discharge status: Home / Self Care | Payer: Medicare Other | Source: Ambulatory Visit | Attending: Cardiology | Admitting: Cardiology

## 2018-11-28 DIAGNOSIS — I214 Non-ST elevation (NSTEMI) myocardial infarction: Secondary | ICD-10-CM

## 2018-11-28 DIAGNOSIS — Z955 Presence of coronary angioplasty implant and graft: Secondary | ICD-10-CM

## 2018-11-28 NOTE — Progress Notes (Signed)
Cardiac Individual Treatment Plan  Patient Details  Name: Marcus MECCA Sr. MRN: [id number] Date of Birth: 09/18/1932 Referring Provider:     CARDIAC REHAB PHASE II ORIENTATION from 11/13/2018 in [facility]  Referring Provider  Martinique, Peter MD       Initial Encounter Date:    CARDIAC REHAB PHASE II ORIENTATION from 11/13/2018 in [facility]  Date  11/13/18      Visit Diagnosis: NSTEMI (non-ST elevated myocardial infarction) [facility])  Status post coronary artery stent placement  Patient's Home Medications on Admission:  Current Outpatient Medications:  .  aspirin EC 81 MG EC tablet, Take 1 tablet (81 mg total) by mouth daily., Disp: , Rfl:  .  busPIRone (BUSPAR) 5 MG tablet, Take 5 mg by mouth 2 (two) times daily., Disp: , Rfl: 0 .  clopidogrel (PLAVIX) 75 MG tablet, TAKE 4 TABLETS BY MOUTH TOMORROW(STARTING DOSE), THEN FURTHERMORE TAKE 1 TABLET BY MOUTH DAILY, Disp: 30 tablet, Rfl: 11 .  furosemide (LASIX) 40 MG tablet, Take (20mg ) half tab Monday, Wednesday and Friday and (40mg ) 1 tablet Tuesday, Thursday, Sat and Sun, Disp: 90 tablet, Rfl: 2 .  levothyroxine (SYNTHROID, LEVOTHROID) 100 MCG tablet, Take 100 mcg by mouth daily. , Disp: , Rfl: 3 .  losartan (COZAAR) 50 MG tablet, Take 1 tablet (50 mg total) by mouth daily., Disp: 30 tablet, Rfl: 6 .  nitroGLYCERIN (NITROSTAT) 0.4 MG SL tablet, Place 1 tablet (0.4 mg total) under the tongue every 5 (five) minutes x 3 doses as needed for chest pain., Disp: 25 tablet, Rfl: 4 .  predniSONE (DELTASONE) 5 MG tablet, TAKE 1 TABLET BY MOUTH DAILY WITH BREAKFAST. TAKE EXTRA TABLET AS NEEDED, Disp: 120 tablet, Rfl: 1 .  pyridostigmine (MESTINON) 60 MG tablet, Take 1 tablet (60 mg total) by mouth daily., Disp: 90 tablet, Rfl: 3  Past Medical History: Past Medical History:  Diagnosis Date  . Acute on chronic combined systolic and diastolic CHF (congestive heart failure) ([place])  08/22/2018  . Acute on chronic respiratory failure ([place]) 04/15/2015  . CAD in native artery 08/22/2018  . CAP (community acquired pneumonia) 04/13/2015   See cxr 04/13/2015 > admit [facility]    . H/O hiatal hernia   . Hyperlipidemia LDL goal <70 08/22/2018  . Hypertension 08/22/2018  . Hypothyroidism 04/13/2015  . Myasthenia gravis ([place]) 04/13/2015  . Obesity 04/13/2015  . Paroxysmal A-fib ([place]) 04/16/2015  . S/P angioplasty with stent 08/21/18 DES to RCA 08/22/2018  . Sepsis ([place]) 04/15/2015    Tobacco Use: Social History   Tobacco Use  Smoking Status Former Smoker  . Packs/day: 1.00  . Years: 30.00  . Pack years: 30.00  . Types: Cigarettes  . Last attempt to quit: 01/10/1982  . Years since quitting: 36.9  Smokeless Tobacco Never Used    Labs: Recent Chemical engineer    Labs for ITP Cardiac and Pulmonary Rehab Latest Ref Rng & Units 06/11/2012 08/21/2018   Cholestrol 0 - 200 mg/dL - 168   LDLCALC 0 - 99 mg/dL - 96   HDL >40 mg/dL - 61   Trlycerides <150 mg/dL - 56   TCO2 0 - 100 mmol/L 19 -      Capillary Blood Glucose: Lab Results  Component Value Date   GLUCAP 98 07/10/2013     Exercise Target Goals: Exercise Program Goal: Individual exercise prescription set using results from initial 6 min walk test and THRR while considering  patient's  activity barriers and safety.   Exercise Prescription Goal: Initial exercise prescription builds to 30-45 minutes a day of aerobic activity, 2-3 days per week.  Home exercise guidelines will be given to patient during program as part of exercise prescription that the participant will acknowledge.  Activity Barriers & Risk Stratification: Activity Barriers & Cardiac Risk Stratification - 11/13/18 1007      Activity Barriers & Cardiac Risk Stratification   Activity Barriers  Other (comment)    Comments  Myathenis Gravis    Cardiac Risk Stratification  High       6 Minute Walk: 6 Minute Walk    Row Name 11/13/18 1005          6 Minute Walk   Phase  Initial     Distance  1419 feet     Walk Time  6 minutes     # of Rest Breaks  0     MPH  2.69     METS  2.54     RPE  11     Perceived Dyspnea   0     VO2 Peak  8.9     Symptoms  No     Resting HR  69 bpm     Resting BP  138/78     Resting Oxygen Saturation   97 %     Exercise Oxygen Saturation  during 6 min walk  98 %     Max Ex. HR  92 bpm     Max Ex. BP  150/80     2 Minute Post BP  128/78        Oxygen Initial Assessment:   Oxygen Re-Evaluation:   Oxygen Discharge (Final Oxygen Re-Evaluation):   Initial Exercise Prescription: Initial Exercise Prescription - 11/13/18 1000      Date of Initial Exercise RX and Referring Provider   Date  11/13/18    Referring Provider  Martinique, Peter MD     Expected Discharge Date  02/20/19      Bike   Level  0.5    Minutes  10    METs  2.26      NuStep   Level  2    SPM  75    Minutes  10    METs  2.3      Track   Laps  9    Minutes  10    METs  2.53      Prescription Details   Frequency (times per week)  3x    Duration  Progress to 30 minutes of continuous aerobic without signs/symptoms of physical distress      Intensity   THRR 40-80% of Max Heartrate  54-109    Ratings of Perceived Exertion  11-13    Perceived Dyspnea  0-4      Progression   Progression  Continue progressive overload as per policy without signs/symptoms or physical distress.      Resistance Training   Training Prescription  Yes    Weight  3lbs    Reps  10-15       Perform Capillary Blood Glucose checks as needed.  Exercise Prescription Changes:  Exercise Prescription Changes    Row Name 11/19/18 0952 11/24/18 0953           Response to Exercise   Blood Pressure (Admit)  134/80  112/70      Blood Pressure (Exercise)  124/80  124/80      Blood Pressure (Exit)  102/60  108/62      Heart Rate (Admit)  68 bpm  75 bpm      Heart Rate (Exercise)  99 bpm  90 bpm      Heart Rate (Exit)  68 bpm  69 bpm       Rating of Perceived Exertion (Exercise)  13  12      Symptoms  none  none      Duration  Progress to 30 minutes of  aerobic without signs/symptoms of physical distress  Progress to 30 minutes of  aerobic without signs/symptoms of physical distress      Intensity  THRR unchanged  THRR unchanged        Progression   Progression  Continue to progress workloads to maintain intensity without signs/symptoms of physical distress.  Continue to progress workloads to maintain intensity without signs/symptoms of physical distress.      Average METs  2.4  2.6        Resistance Training   Training Prescription  No Relaxation day, no weights.  Yes      Weight  -  2lbs      Reps  -  10-15      Time  -  10 Minutes        Interval Training   Interval Training  No  No        Bike   Level  0.5  0.5      Minutes  10  10      METs  2.25  2.24        NuStep   Level  2  2      SPM  75  85      Minutes  10  10      METs  1.9  2.2        Track   Laps  11  13      Minutes  10  10      METs  2.92  3.26        Home Exercise Plan   Plans to continue exercise at  -  Home (comment) Walking      Frequency  -  Add 1 additional day to program exercise sessions.      Initial Home Exercises Provided  -  11/24/18         Exercise Comments:  Exercise Comments    Row Name 11/19/18 1047 11/24/18 1025         Exercise Comments  Patient tolerated low intensity exercise without symptoms.  Reviewed home exercise guidelines, METs, and goals with patient.         Exercise Goals and Review:  Exercise Goals    Row Name 11/13/18 0737             Exercise Goals   Increase Physical Activity  Yes       Intervention  Provide advice, education, support and counseling about physical activity/exercise needs.;Develop an individualized exercise prescription for aerobic and resistive training based on initial evaluation findings, risk stratification, comorbidities and participant's personal goals.        Expected Outcomes  Short Term: Attend rehab on a regular basis to increase amount of physical activity.;Long Term: Exercising regularly at least 3-5 days a week.;Long Term: Add in home exercise to make exercise part of routine and to increase amount of physical activity.       Increase Strength and Stamina  Yes       Intervention  Provide advice, education, support and counseling about physical activity/exercise needs.;Develop an individualized exercise prescription for aerobic and resistive training based on initial evaluation findings, risk stratification, comorbidities and participant's personal goals.       Expected Outcomes  Short Term: Increase workloads from initial exercise prescription for resistance, speed, and METs.;Short Term: Perform resistance training exercises routinely during rehab and add in resistance training at home;Long Term: Improve cardiorespiratory fitness, muscular endurance and strength as measured by increased METs and functional capacity (6MWT)       Able to understand and use rate of perceived exertion (RPE) scale  Yes       Intervention  Provide education and explanation on how to use RPE scale       Expected Outcomes  Short Term: Able to use RPE daily in rehab to express subjective intensity level;Long Term:  Able to use RPE to guide intensity level when exercising independently       Knowledge and understanding of Target Heart Rate Range (THRR)  Yes       Intervention  Provide education and explanation of THRR including how the numbers were predicted and where they are located for reference       Expected Outcomes  Short Term: Able to state/look up THRR;Long Term: Able to use THRR to govern intensity when exercising independently;Short Term: Able to use daily as guideline for intensity in rehab       Able to check pulse independently  Yes       Intervention  Provide education and demonstration on how to check pulse in carotid and radial arteries.;Review the importance of  being able to check your own pulse for safety during independent exercise       Expected Outcomes  Short Term: Able to explain why pulse checking is important during independent exercise;Long Term: Able to check pulse independently and accurately       Understanding of Exercise Prescription  Yes       Intervention  Provide education, explanation, and written materials on patient's individual exercise prescription       Expected Outcomes  Short Term: Able to explain program exercise prescription;Long Term: Able to explain home exercise prescription to exercise independently          Exercise Goals Re-Evaluation : Exercise Goals Re-Evaluation    Row Name 11/19/18 1047 11/24/18 1025           Exercise Goal Re-Evaluation   Exercise Goals Review  Increase Physical Activity;Able to understand and use rate of perceived exertion (RPE) scale  Increase Physical Activity;Able to understand and use rate of perceived exertion (RPE) scale;Knowledge and understanding of Target Heart Rate Range (THRR);Understanding of Exercise Prescription;Able to check pulse independently      Comments  Patient able to understand and use RPE scale appropriately.   Reviewed home exercise guidelines with patient including THRR, RPE scale, and endpoints for exercise. Pt has a home pulse oximeter that he can use to check his pulse. Pt plans to walk as his mode of home exercise.      Expected Outcomes  Increase workloads as tolerated to help improve cardiorespiratory fitness.  Patient will walk at least 30 minutes, 1-2 days/week at home in addition to exercise at cardiac rehab to help achieve personal health and fitness goals.         Discharge Exercise Prescription (Final Exercise Prescription Changes): Exercise Prescription Changes - 11/24/18 0953      Response to Exercise   Blood Pressure (Admit)  112/70    Blood Pressure (Exercise)  124/80    Blood Pressure (Exit)  108/62    Heart Rate (Admit)  75 bpm    Heart Rate  (Exercise)  90 bpm    Heart Rate (Exit)  69 bpm    Rating of Perceived Exertion (Exercise)  12    Symptoms  none    Duration  Progress to 30 minutes of  aerobic without signs/symptoms of physical distress    Intensity  THRR unchanged      Progression   Progression  Continue to progress workloads to maintain intensity without signs/symptoms of physical distress.    Average METs  2.6      Resistance Training   Training Prescription  Yes    Weight  2lbs    Reps  10-15    Time  10 Minutes      Interval Training   Interval Training  No      Bike   Level  0.5    Minutes  10    METs  2.24      NuStep   Level  2    SPM  85    Minutes  10    METs  2.2      Track   Laps  13    Minutes  10    METs  3.26      Home Exercise Plan   Plans to continue exercise at  Home (comment)   Walking   Frequency  Add 1 additional day to program exercise sessions.    Initial Home Exercises Provided  11/24/18       Nutrition:  Target Goals: Understanding of nutrition guidelines, daily intake of sodium 1500mg , cholesterol 200mg , calories 30% from fat and 7% or less from saturated fats, daily to have 5 or more servings of fruits and vegetables.  Biometrics: Pre Biometrics - 11/13/18 1007      Pre Biometrics   Height  5\' 8"  (1.727 m)    Weight  166 lb 10.7 oz (75.6 kg)    Waist Circumference  39 inches    Hip Circumference  37.5 inches    Waist to Hip Ratio  1.04 %    BMI (Calculated)  25.35    Triceps Skinfold  8 mm    % Body Fat  24.3 %    Grip Strength  39 kg    Flexibility  13 in    Single Leg Stand  5.5 seconds        Nutrition Therapy Plan and Nutrition Goals: Nutrition Therapy & Goals - 11/19/18 1047      Nutrition Therapy   Diet  heart healthy      Personal Nutrition Goals   Nutrition Goal  pt continue to eat vegan diet and build ahealthy plate including vegetables, fruits, whole grains, beans and legumes in a heart healthy meal plan    Personal Goal #2  pt to  explore new vegan recipes    Personal Goal #3  Weigh and measure portions    Personal Goal #4  Practice intuitive eating exercises      Intervention Plan   Intervention  Prescribe, educate and counsel regarding individualized specific dietary modifications aiming towards targeted core components such as weight, hypertension, lipid management, diabetes, heart failure and other comorbidities.    Expected Outcomes  Short Term Goal: Understand basic principles of dietary content, such as calories, fat, sodium, cholesterol and nutrients.;Long Term Goal: Adherence to prescribed nutrition plan.  Nutrition Assessments: Nutrition Assessments - 11/13/18 0840      MEDFICTS Scores   Pre Score  0       Nutrition Goals Re-Evaluation: Nutrition Goals Re-Evaluation    [place] Name 11/13/18 0839             Goals   Current Weight  166 lb 10.7 oz (75.6 kg)          Nutrition Goals Re-Evaluation: Nutrition Goals Re-Evaluation    [place] Name 11/13/18 0839             Goals   Current Weight  166 lb 10.7 oz (75.6 kg)          Nutrition Goals Discharge (Final Nutrition Goals Re-Evaluation): Nutrition Goals Re-Evaluation - 11/13/18 0839      Goals   Current Weight  166 lb 10.7 oz (75.6 kg)       Psychosocial: Target Goals: Acknowledge presence or absence of significant depression and/or stress, maximize coping skills, provide positive support system. Participant is able to verbalize types and ability to use techniques and skills needed for reducing stress and depression.  Initial Review & Psychosocial Screening: Initial Psych Review & Screening - 11/13/18 0934      Initial Review   Current issues with  None Identified      Family Dynamics   Good Support System?  Yes   Pt lists his exwife and children as sources of support.       Barriers   Psychosocial barriers to participate in program  There are no identifiable barriers or psychosocial needs.      Screening Interventions    Interventions  Encouraged to exercise       Quality of Life Scores: Quality of Life - 11/13/18 0803      Quality of Life   Select  Quality of Life      Quality of Life Scores   Health/Function Pre  21.15 %    Socioeconomic Pre  30 %    Psych/Spiritual Pre  23.64 %    Family Pre  30 %    GLOBAL Pre  24.68 %      Scores of 19 and below usually indicate a poorer quality of life in these areas.  A difference of  2-3 points is a clinically meaningful difference.  A difference of 2-3 points in the total score of the Quality of Life Index has been associated with significant improvement in overall quality of life, self-image, physical symptoms, and general health in studies assessing change in quality of life.  PHQ-9: Recent Review Flowsheet Data    Depression screen [facility] 2/9 11/19/2018   Decreased Interest 0   Down, Depressed, Hopeless 0   PHQ - 2 Score 0     Interpretation of Total Score  Total Score Depression Severity:  1-4 = Minimal depression, 5-9 = Mild depression, 10-14 = Moderate depression, 15-19 = Moderately severe depression, 20-27 = Severe depression   Psychosocial Evaluation and Intervention:   Psychosocial Re-Evaluation: Psychosocial Re-Evaluation    [place] Name 11/28/18 1709             Psychosocial Re-Evaluation   Current issues with  None Identified       Interventions  Encouraged to attend Cardiac Rehabilitation for the exercise       Continue Psychosocial Services   No Follow up required          Psychosocial Discharge (Final Psychosocial Re-Evaluation): Psychosocial Re-Evaluation - 11/28/18 1709  Psychosocial Re-Evaluation   Current issues with  None Identified    Interventions  Encouraged to attend Cardiac Rehabilitation for the exercise    Continue Psychosocial Services   No Follow up required       Vocational Rehabilitation: Provide vocational rehab assistance to qualifying candidates.   Vocational Rehab Evaluation &  Intervention: Vocational Rehab - 11/13/18 0754      Initial Vocational Rehab Evaluation & Intervention   Assessment shows need for Vocational Rehabilitation  No       Education: Education Goals: Education classes will be provided on a weekly basis, covering required topics. Participant will state understanding/return demonstration of topics presented.  Learning Barriers/Preferences: Learning Barriers/Preferences - 11/13/18 1032      Learning Barriers/Preferences   Learning Barriers  None    Learning Preferences  Verbal Instruction;Skilled Demonstration;Written Material       Education Topics: Count Your Pulse:  -Group instruction provided by verbal instruction, demonstration, patient participation and written materials to support subject.  Instructors address importance of being able to find your pulse and how to count your pulse when at home without a heart monitor.  Patients get hands on experience counting their pulse with staff help and individually.   Heart Attack, Angina, and Risk Factor Modification:  -Group instruction provided by verbal instruction, video, and written materials to support subject.  Instructors address signs and symptoms of angina and heart attacks.    Also discuss risk factors for heart disease and how to make changes to improve heart health risk factors.   Functional Fitness:  -Group instruction provided by verbal instruction, demonstration, patient participation, and written materials to support subject.  Instructors address safety measures for doing things around the house.  Discuss how to get up and down off the floor, how to pick things up properly, how to safely get out of a chair without assistance, and balance training.   Meditation and Mindfulness:  -Group instruction provided by verbal instruction, patient participation, and written materials to support subject.  Instructor addresses importance of mindfulness and meditation practice to help reduce  stress and improve awareness.  Instructor also leads participants through a meditation exercise.    Stretching for Flexibility and Mobility:  -Group instruction provided by verbal instruction, patient participation, and written materials to support subject.  Instructors lead participants through series of stretches that are designed to increase flexibility thus improving mobility.  These stretches are additional exercise for major muscle groups that are typically performed during regular warm up and cool down.   Hands Only CPR:  -Group verbal, video, and participation provides a basic overview of AHA guidelines for community CPR. Role-play of emergencies allow participants the opportunity to practice calling for help and chest compression technique with discussion of AED use.   Hypertension: -Group verbal and written instruction that provides a basic overview of hypertension including the most recent diagnostic guidelines, risk factor reduction with self-care instructions and medication management.    Nutrition I class: Heart Healthy Eating:  -Group instruction provided by PowerPoint slides, verbal discussion, and written materials to support subject matter. The instructor gives an explanation and review of the Therapeutic Lifestyle Changes diet recommendations, which includes a discussion on lipid goals, dietary fat, sodium, fiber, plant stanol/sterol esters, sugar, and the components of a well-balanced, healthy diet.   Nutrition II class: Lifestyle Skills:  -Group instruction provided by PowerPoint slides, verbal discussion, and written materials to support subject matter. The instructor gives an explanation and review of label reading, grocery  shopping for heart health, heart healthy recipe modifications, and ways to make healthier choices when eating out.   Diabetes Question & Answer:  -Group instruction provided by PowerPoint slides, verbal discussion, and written materials to support  subject matter. The instructor gives an explanation and review of diabetes co-morbidities, pre- and post-prandial blood glucose goals, pre-exercise blood glucose goals, signs, symptoms, and treatment of hypoglycemia and hyperglycemia, and foot care basics.   Diabetes Blitz:  -Group instruction provided by PowerPoint slides, verbal discussion, and written materials to support subject matter. The instructor gives an explanation and review of the physiology behind type 1 and type 2 diabetes, diabetes medications and rational behind using different medications, pre- and post-prandial blood glucose recommendations and Hemoglobin A1c goals, diabetes diet, and exercise including blood glucose guidelines for exercising safely.    Portion Distortion:  -Group instruction provided by PowerPoint slides, verbal discussion, written materials, and food models to support subject matter. The instructor gives an explanation of serving size versus portion size, changes in portions sizes over the last 20 years, and what consists of a serving from each food group.   Stress Management:  -Group instruction provided by verbal instruction, video, and written materials to support subject matter.  Instructors review role of stress in heart disease and how to cope with stress positively.     Exercising on Your Own:  -Group instruction provided by verbal instruction, power point, and written materials to support subject.  Instructors discuss benefits of exercise, components of exercise, frequency and intensity of exercise, and end points for exercise.  Also discuss use of nitroglycerin and activating EMS.  Review options of places to exercise outside of rehab.  Review guidelines for sex with heart disease.   Cardiac Drugs I:  -Group instruction provided by verbal instruction and written materials to support subject.  Instructor reviews cardiac drug classes: antiplatelets, anticoagulants, beta blockers, and statins.   Instructor discusses reasons, side effects, and lifestyle considerations for each drug class.   Cardiac Drugs II:  -Group instruction provided by verbal instruction and written materials to support subject.  Instructor reviews cardiac drug classes: angiotensin converting enzyme inhibitors (ACE-I), angiotensin II receptor blockers (ARBs), nitrates, and calcium channel blockers.  Instructor discusses reasons, side effects, and lifestyle considerations for each drug class.   Anatomy and Physiology of the Circulatory System:  Group verbal and written instruction and models provide basic cardiac anatomy and physiology, with the coronary electrical and arterial systems. Review of: AMI, Angina, Valve disease, Heart Failure, Peripheral Artery Disease, Cardiac Arrhythmia, Pacemakers, and the ICD.   Other Education:  -Group or individual verbal, written, or video instructions that support the educational goals of the cardiac rehab program.   Holiday Eating Survival Tips:  -Group instruction provided by PowerPoint slides, verbal discussion, and written materials to support subject matter. The instructor gives patients tips, tricks, and techniques to help them not only survive but enjoy the holidays despite the onslaught of food that accompanies the holidays.   Knowledge Questionnaire Score: Knowledge Questionnaire Score - 11/13/18 0747      Knowledge Questionnaire Score   Pre Score  20/24       Core Components/Risk Factors/Patient Goals at Admission: Personal Goals and Risk Factors at Admission - 11/13/18 1033      Core Components/Risk Factors/Patient Goals on Admission    Weight Management  Yes;Weight Loss    Intervention  Weight Management: Develop a combined nutrition and exercise program designed to reach desired caloric intake, while maintaining appropriate intake  of nutrient and fiber, sodium and fats, and appropriate energy expenditure required for the weight goal.;Weight  Management/Obesity: Establish reasonable short term and long term weight goals.;Weight Management: Provide education and appropriate resources to help participant work on and attain dietary goals.    Admit Weight  166 lb 10.7 oz (75.6 kg)    Goal Weight: Long Term  156 lb (70.8 kg)    Expected Outcomes  Short Term: Continue to assess and modify interventions until short term weight is achieved;Long Term: Adherence to nutrition and physical activity/exercise program aimed toward attainment of established weight goal;Weight Loss: Understanding of general recommendations for a balanced deficit meal plan, which promotes 1-2 lb weight loss per week and includes a negative energy balance of [phone] kcal/d;Understanding recommendations for meals to include 15-35% energy as protein, 25-35% energy from fat, 35-60% energy from carbohydrates, less than 200mg  of dietary cholesterol, 20-35 gm of total fiber daily;Understanding of distribution of calorie intake throughout the day with the consumption of 4-5 meals/snacks    Hypertension  Yes    Intervention  Monitor prescription use compliance.;Provide education on lifestyle modifcations including regular physical activity/exercise, weight management, moderate sodium restriction and increased consumption of fresh fruit, vegetables, and low fat dairy, alcohol moderation, and smoking cessation.    Expected Outcomes  Short Term: Continued assessment and intervention until BP is < 140/63mm HG in hypertensive participants. < 130/61mm HG in hypertensive participants with diabetes, heart failure or chronic kidney disease.;Long Term: Maintenance of blood pressure at goal levels.    Lipids  Yes    Intervention  Provide education and support for participant on nutrition & aerobic/resistive exercise along with prescribed medications to achieve LDL 70mg , HDL >40mg .    Expected Outcomes  Short Term: Participant states understanding of desired cholesterol values and is compliant with  medications prescribed. Participant is following exercise prescription and nutrition guidelines.;Long Term: Cholesterol controlled with medications as prescribed, with individualized exercise RX and with personalized nutrition plan. Value goals: LDL < 70mg , HDL > 40 mg.       Core Components/Risk Factors/Patient Goals Review:  Goals and Risk Factor Review    Row Name 11/28/18 1710             Core Components/Risk Factors/Patient Goals Review   Personal Goals Review  Weight Management/Obesity;Lipids;Hypertension       Review  Koleton's vital signs have been stable. Darryl is doing well with exercise so far.       Expected Outcomes  Zylan will continue to partcipate in phase 2 cardiac rehab for nutrition, exercise and lifestyle modifications          Core Components/Risk Factors/Patient Goals at Discharge (Final Review):  Goals and Risk Factor Review - 11/28/18 1710      Core Components/Risk Factors/Patient Goals Review   Personal Goals Review  Weight Management/Obesity;Lipids;Hypertension    Review  Ozzy's vital signs have been stable. Cru is doing well with exercise so far.    Expected Outcomes  Eino will continue to partcipate in phase 2 cardiac rehab for nutrition, exercise and lifestyle modifications       ITP Comments: ITP Comments    Row Name 11/13/18 0737 11/28/18 1708         ITP Comments  Dr. Fransico Him, Medical Director  30 Day ITP Review. Remo is off to a good start to exercise.         Comments: See ITP comments.[name], RN,BSN 12/04/2018 11:51 AM

## 2018-12-01 ENCOUNTER — Ambulatory Visit (HOSPITAL_COMMUNITY): Payer: Medicare Other

## 2018-12-01 ENCOUNTER — Encounter (HOSPITAL_COMMUNITY)
Admission: RE | Admit: 2018-12-01 | Discharge: 2018-12-01 | Disposition: A | Discharge status: Home / Self Care | Payer: Medicare Other | Source: Ambulatory Visit | Attending: Cardiology | Admitting: Cardiology

## 2018-12-01 DIAGNOSIS — I214 Non-ST elevation (NSTEMI) myocardial infarction: Secondary | ICD-10-CM | POA: Insufficient documentation

## 2018-12-01 DIAGNOSIS — Z955 Presence of coronary angioplasty implant and graft: Secondary | ICD-10-CM

## 2018-12-03 ENCOUNTER — Ambulatory Visit (HOSPITAL_COMMUNITY): Payer: Medicare Other

## 2018-12-03 ENCOUNTER — Encounter (HOSPITAL_COMMUNITY)
Admission: RE | Admit: 2018-12-03 | Discharge: 2018-12-03 | Disposition: A | Discharge status: Home / Self Care | Payer: Medicare Other | Source: Ambulatory Visit | Attending: Cardiology | Admitting: Cardiology

## 2018-12-03 VITALS — BP 130/80 | HR 63 | Wt 165.3 lb

## 2018-12-03 DIAGNOSIS — Z955 Presence of coronary angioplasty implant and graft: Secondary | ICD-10-CM | POA: Diagnosis not present

## 2018-12-03 DIAGNOSIS — I214 Non-ST elevation (NSTEMI) myocardial infarction: Secondary | ICD-10-CM | POA: Diagnosis not present

## 2018-12-05 ENCOUNTER — Ambulatory Visit (HOSPITAL_COMMUNITY): Payer: Medicare Other

## 2018-12-05 ENCOUNTER — Encounter (HOSPITAL_COMMUNITY): Payer: Medicare Other

## 2018-12-08 ENCOUNTER — Encounter (HOSPITAL_COMMUNITY): Payer: Medicare Other

## 2018-12-08 ENCOUNTER — Ambulatory Visit (HOSPITAL_COMMUNITY): Payer: Medicare Other

## 2018-12-08 ENCOUNTER — Encounter (HOSPITAL_COMMUNITY): Payer: Self-pay | Admitting: *Deleted

## 2018-12-08 ENCOUNTER — Telehealth (HOSPITAL_COMMUNITY): Payer: Self-pay | Admitting: *Deleted

## 2018-12-08 DIAGNOSIS — Z955 Presence of coronary angioplasty implant and graft: Secondary | ICD-10-CM

## 2018-12-08 DIAGNOSIS — I214 Non-ST elevation (NSTEMI) myocardial infarction: Secondary | ICD-10-CM

## 2018-12-08 NOTE — Progress Notes (Signed)
Discharge Progress Report  Patient Details  Name: Marcus LILE Sr. MRN: [id number] Date of Birth: August 25, 1932 Referring Provider:     CARDIAC REHAB PHASE II ORIENTATION from 11/13/2018 in [place]  Referring Provider  Martinique, Peter MD        Number of Visits: 8  Reason for Discharge:  Early Exit:  Personal  Smoking History:  Social History   Tobacco Use  Smoking Status Former Smoker  . Packs/day: 1.00  . Years: 30.00  . Pack years: 30.00  . Types: Cigarettes  . Last attempt to quit: 01/10/1982  . Years since quitting: 36.9  Smokeless Tobacco Never Used    Diagnosis:  NSTEMI (non-ST elevated myocardial infarction) ([place])  Status post coronary artery stent placement  ADL [facility]:   Initial Exercise Prescription: Initial Exercise Prescription - 11/13/18 1000      Date of Initial Exercise RX and Referring Provider   Date  11/13/18    Referring Provider  Martinique, Peter MD     Expected Discharge Date  02/20/19      Bike   Level  0.5    Minutes  10    METs  2.26      NuStep   Level  2    SPM  75    Minutes  10    METs  2.3      Track   Laps  9    Minutes  10    METs  2.53      Prescription Details   Frequency (times per week)  3x    Duration  Progress to 30 minutes of continuous aerobic without signs/symptoms of physical distress      Intensity   THRR 40-80% of Max Heartrate  54-109    Ratings of Perceived Exertion  11-13    Perceived Dyspnea  0-4      Progression   Progression  Continue progressive overload as per policy without signs/symptoms or physical distress.      Resistance Training   Training Prescription  Yes    Weight  3lbs    Reps  10-15       Discharge Exercise Prescription (Final Exercise Prescription Changes): Exercise Prescription Changes - 12/03/18 0956      Response to Exercise   Blood Pressure (Admit)  130/80    Blood Pressure (Exercise)  146/86    Blood Pressure (Exit)  130/78    Heart  Rate (Admit)  63 bpm    Heart Rate (Exercise)  93 bpm    Heart Rate (Exit)  74 bpm    Rating of Perceived Exertion (Exercise)  14    Symptoms  none    Duration  Progress to 30 minutes of  aerobic without signs/symptoms of physical distress    Intensity  THRR unchanged      Progression   Progression  Continue to progress workloads to maintain intensity without signs/symptoms of physical distress.    Average METs  2.9      Resistance Training   Training Prescription  No   Relaxation day, no weights.     Interval Training   Interval Training  No      Bike   Level  0.7    Minutes  10    METs  2.81      NuStep   Level  2    SPM  85    Minutes  10    METs  --   Didn't  report average     Track   Laps  11    Minutes  10    METs  2.91      Home Exercise Plan   Plans to continue exercise at  Home (comment)   Walking   Frequency  Add 1 additional day to program exercise sessions.    Initial Home Exercises Provided  11/24/18       Functional Capacity: 6 Minute Walk    Row Name 11/13/18 1005         6 Minute Walk   Phase  Initial     Distance  1419 feet     Walk Time  6 minutes     # of Rest Breaks  0     MPH  2.69     METS  2.54     RPE  11     Perceived Dyspnea   0     VO2 Peak  8.9     Symptoms  No     Resting HR  69 bpm     Resting BP  138/78     Resting Oxygen Saturation   97 %     Exercise Oxygen Saturation  during 6 min walk  98 %     Max Ex. HR  92 bpm     Max Ex. BP  150/80     2 Minute Post BP  128/78        Psychological, QOL, Others - Outcomes: PHQ 2/9: Depression screen PHQ 2/9 11/19/2018  Decreased Interest 0  Down, Depressed, Hopeless 0  PHQ - 2 Score 0    Quality of Life: Quality of Life - 11/13/18 0803      Quality of Life   Select  Quality of Life      Quality of Life Scores   Health/Function Pre  21.15 %    Socioeconomic Pre  30 %    Psych/Spiritual Pre  23.64 %    Family Pre  30 %    GLOBAL Pre  24.68 %       Personal  Goals: Goals established at orientation with interventions provided to work toward goal. Personal Goals and Risk Factors at Admission - 11/13/18 1033      Core Components/Risk Factors/Patient Goals on Admission    Weight Management  Yes;Weight Loss    Intervention  Weight Management: Develop a combined nutrition and exercise program designed to reach desired caloric intake, while maintaining appropriate intake of nutrient and fiber, sodium and fats, and appropriate energy expenditure required for the weight goal.;Weight Management/Obesity: Establish reasonable short term and long term weight goals.;Weight Management: Provide education and appropriate resources to help participant work on and attain dietary goals.    Admit Weight  166 lb 10.7 oz (75.6 kg)    Goal Weight: Long Term  156 lb (70.8 kg)    Expected Outcomes  Short Term: Continue to assess and modify interventions until short term weight [name] achieved;Long Term: Adherence to nutrition and physical activity/exercise program aimed toward attainment of established weight goal;Weight Loss: Understanding of general recommendations for a balanced deficit meal plan, which promotes 1-2 lb weight loss per week and includes a negative energy balance of [phone] kcal/d;Understanding recommendations for meals to include 15-35% energy as protein, 25-35% energy from fat, 35-60% energy from carbohydrates, less than 200mg  of dietary cholesterol, 20-35 gm of total fiber daily;Understanding of distribution of calorie intake throughout the day with the consumption of 4-5 meals/snacks  Hypertension  Yes    Intervention  Monitor prescription use compliance.;Provide education on lifestyle modifcations including regular physical activity/exercise, weight management, moderate sodium restriction and increased consumption of fresh fruit, vegetables, and low fat dairy, alcohol moderation, and smoking cessation.    Expected Outcomes  Short Term: Continued assessment and  intervention until BP [name] < 140/36mm HG in hypertensive participants. < 130/79mm HG in hypertensive participants with diabetes, heart failure or chronic kidney disease.;Long Term: Maintenance of blood pressure at goal levels.    Lipids  Yes    Intervention  Provide education and support for participant on nutrition & aerobic/resistive exercise along with prescribed medications to achieve LDL 70mg , HDL >40mg .    Expected Outcomes  Short Term: Participant states understanding of desired cholesterol values and [name] compliant with medications prescribed. Participant [name] following exercise prescription and nutrition guidelines.;Long Term: Cholesterol controlled with medications as prescribed, with individualized exercise RX and with personalized nutrition plan. Value goals: LDL < 70mg , HDL > 40 mg.        Personal Goals Discharge: Goals and Risk Factor Review    Row Name 11/28/18 1710             Core Components/Risk Factors/Patient Goals Review   Personal Goals Review  Weight Management/Obesity;Lipids;Hypertension       Review  Marcus [name] vital signs have been stable. Marcus [name] doing well with exercise so far.       Expected Outcomes  Marcus [name] continue to partcipate in phase 2 cardiac rehab for nutrition, exercise and lifestyle modifications          Exercise Goals and Review: Exercise Goals    Row Name 11/13/18 0737             Exercise Goals   Increase Physical Activity  Yes       Intervention  Provide advice, education, support and counseling about physical activity/exercise needs.;Develop an individualized exercise prescription for aerobic and resistive training based on initial evaluation findings, risk stratification, comorbidities and participant's personal goals.       Expected Outcomes  Short Term: Attend rehab on a regular basis to increase amount of physical activity.;Long Term: Exercising regularly at least 3-5 days a week.;Long Term: Add in home exercise to make exercise  part of routine and to increase amount of physical activity.       Increase Strength and Stamina  Yes       Intervention  Provide advice, education, support and counseling about physical activity/exercise needs.;Develop an individualized exercise prescription for aerobic and resistive training based on initial evaluation findings, risk stratification, comorbidities and participant's personal goals.       Expected Outcomes  Short Term: Increase workloads from initial exercise prescription for resistance, speed, and METs.;Short Term: Perform resistance training exercises routinely during rehab and add in resistance training at home;Long Term: Improve cardiorespiratory fitness, muscular endurance and strength as measured by increased METs and functional capacity (6MWT)       Able to understand and use rate of perceived exertion (RPE) scale  Yes       Intervention  Provide education and explanation on how to use RPE scale       Expected Outcomes  Short Term: Able to use RPE daily in rehab to express subjective intensity level;Long Term:  Able to use RPE to guide intensity level when exercising independently       Knowledge and understanding of Target Heart Rate Range (THRR)  Yes       Intervention  Provide education and explanation of THRR including how the numbers were predicted and where they are located for reference       Expected Outcomes  Short Term: Able to state/look up THRR;Long Term: Able to use THRR to govern intensity when exercising independently;Short Term: Able to use daily as guideline for intensity in rehab       Able to check pulse independently  Yes       Intervention  Provide education and demonstration on how to check pulse in carotid and radial arteries.;Review the importance of being able to check your own pulse for safety during independent exercise       Expected Outcomes  Short Term: Able to explain why pulse checking [name] important during independent exercise;Long Term: Able to check  pulse independently and accurately       Understanding of Exercise Prescription  Yes       Intervention  Provide education, explanation, and written materials on patient's individual exercise prescription       Expected Outcomes  Short Term: Able to explain program exercise prescription;Long Term: Able to explain home exercise prescription to exercise independently          Exercise Goals Re-Evaluation: Exercise Goals Re-Evaluation    Row Name 11/19/18 1047 11/24/18 1025 12/09/18 1620         Exercise Goal Re-Evaluation   Exercise Goals Review  Increase Physical Activity;Able to understand and use rate of perceived exertion (RPE) scale  Increase Physical Activity;Able to understand and use rate of perceived exertion (RPE) scale;Knowledge and understanding of Target Heart Rate Range (THRR);Understanding of Exercise Prescription;Able to check pulse independently  Able to check pulse independently     Comments  Patient able to understand and use RPE scale appropriately.   Reviewed home exercise guidelines with patient including THRR, RPE scale, and endpoints for exercise. Pt has a home pulse oximeter that he can use to check his pulse. Pt plans to walk as his mode of home exercise.  Patient decided to discontinue participation in the program     Expected Outcomes  Increase workloads as tolerated to help improve cardiorespiratory fitness.  Patient [name] walk at least 30 minutes, 1-2 days/week at home in addition to exercise at cardiac rehab to help achieve personal health and fitness goals.  -        Nutrition & Weight - Outcomes: Pre Biometrics - 11/13/18 1007      Pre Biometrics   Height  5\' 8"  (1.727 m)    Weight  166 lb 10.7 oz (75.6 kg)    Waist Circumference  39 inches    Hip Circumference  37.5 inches    Waist to Hip Ratio  1.04 %    BMI (Calculated)  25.35    Triceps Skinfold  8 mm    % Body Fat  24.3 %    Grip Strength  39 kg    Flexibility  13 in    Single Leg Stand  5.5  seconds      Post Biometrics - 12/03/18 0956       Post  Biometrics   Weight  165 lb 5.5 oz (75 kg)    BMI (Calculated)  25.15       Nutrition: Nutrition Therapy & Goals - 11/19/18 1047      Nutrition Therapy   Diet  heart healthy      Personal Nutrition Goals   Nutrition Goal  pt continue to eat vegan diet and build ahealthy plate  including vegetables, fruits, whole grains, beans and legumes in a heart healthy meal plan    Personal Goal #2  pt to explore new vegan recipes    Personal Goal #3  Weigh and measure portions    Personal Goal #4  Practice intuitive eating exercises      Intervention Plan   Intervention  Prescribe, educate and counsel regarding individualized specific dietary modifications aiming towards targeted core components such as weight, hypertension, lipid management, diabetes, heart failure and other comorbidities.    Expected Outcomes  Short Term Goal: Understand basic principles of dietary content, such as calories, fat, sodium, cholesterol and nutrients.;Long Term Goal: Adherence to prescribed nutrition plan.       Nutrition Discharge: Nutrition Assessments - 12/18/18 0916      MEDFICTS Scores   Pre Score  0    Post Score  --   pt did not return post survey      Education Questionnaire Score: Knowledge Questionnaire Score - 11/13/18 0747      Knowledge Questionnaire Score   Pre Score  20/24       Depaul attended 8 exercise sessions between 11/13/18-12/03/18. Paton did well with exercise and enjoyed participating in cardiac rehab. Kazden decided to complete the program early.[name], RN,BSN 12/18/2018 9:32 AM

## 2018-12-10 ENCOUNTER — Encounter (HOSPITAL_COMMUNITY): Payer: Medicare Other

## 2018-12-10 ENCOUNTER — Ambulatory Visit (HOSPITAL_COMMUNITY): Payer: Medicare Other

## 2018-12-12 ENCOUNTER — Ambulatory Visit (HOSPITAL_COMMUNITY): Payer: Medicare Other

## 2018-12-12 ENCOUNTER — Encounter (HOSPITAL_COMMUNITY): Payer: Medicare Other

## 2018-12-15 ENCOUNTER — Ambulatory Visit (HOSPITAL_COMMUNITY): Payer: Medicare Other

## 2018-12-15 ENCOUNTER — Encounter (HOSPITAL_COMMUNITY): Payer: Medicare Other

## 2018-12-17 ENCOUNTER — Encounter (HOSPITAL_COMMUNITY): Payer: Medicare Other

## 2018-12-17 ENCOUNTER — Ambulatory Visit (HOSPITAL_COMMUNITY): Payer: Medicare Other

## 2018-12-18 ENCOUNTER — Telehealth: Payer: Self-pay

## 2018-12-18 NOTE — Telephone Encounter (Signed)
Patient emailed the following info of concerns Dr Melvyn Novas: In view of my age and compromised health I have made the decision to self quarantine. In that I live alone,it has been easy for me to cut off outside visits by anyone including my adult children some of whom live in the area.I see that survival once the virus is contracted would almost surely require being hospitalized and on a ventilator and frankly if this thing over whelms our system the chance ofmy access to that treatment may notbe an option.My question for you is is there anything I can do in advance that might improve my ability to fight off the infection and would it be of any use to order one of the oxygen condensing apparatus's to have on hand? Best regards to my favorite doc.Marcus [name] [birth year]  Advised patient of the covid19 risks went other in depth of if he is high or low risk of covid19 Patient had not traveled, not been around anyone that traveled, not sick, not been around anyone sick, and has no symptoms Advised patient of the precautions to take, and keep doing as he been doing Pt verbalized and expressed understanding Nothing further needed at this time

## 2018-12-18 NOTE — Telephone Encounter (Signed)
   Cardiac Questionnaire:    Since your last visit or hospitalization:    1. Have you been having new or worsening chest pain? No   2. Have you been having new or worsening shortness of breath? No 3. Have you been having new or worsening leg swelling, wt gain, or increase in abdominal girth (pants fitting more tightly)? No   4. Have you had any passing out spells? No    *A YES to any of these questions would result in the appointment being kept. *If all the answers to these questions are NO, we should indicate that given the current situation regarding the worldwide coronarvirus pandemic, at the recommendation of the CDC, we are looking to limit gatherings in our waiting area, and thus will reschedule their appointment beyond four weeks from today.   _____________   [id number] Pre-Screening Questions:  . Do you currently have a fever? No  . Have you recently travelled on a cruise, internationally, or to [place], [place], [place], [place], [place], or [place], [place] [employer]) ? No . Have you been in contact with someone that is currently pending confirmation of Covid19 testing or has been confirmed to have the [place] virus?  No Are you currently experiencing fatigue or cough? No      Primary Cardiologist:  Peter Martinique, MD   Patient contacted.  History reviewed.  No symptoms to suggest any unstable cardiac conditions.  Based on discussion, with current pandemic situation, we will be postponing this appointment for @PATIENTNAME @.  If symptoms change, he has been instructed to contact our office.   Meryl Crutch, RN  12/18/2018 2:44 PM         .

## 2018-12-19 ENCOUNTER — Ambulatory Visit (HOSPITAL_COMMUNITY): Payer: Medicare Other

## 2018-12-19 ENCOUNTER — Encounter (HOSPITAL_COMMUNITY): Payer: Medicare Other

## 2018-12-22 ENCOUNTER — Encounter (HOSPITAL_COMMUNITY): Payer: Medicare Other

## 2018-12-22 ENCOUNTER — Ambulatory Visit (HOSPITAL_COMMUNITY): Payer: Medicare Other

## 2018-12-24 ENCOUNTER — Ambulatory Visit: Payer: Medicare Other | Admitting: Cardiology

## 2018-12-24 ENCOUNTER — Ambulatory Visit (HOSPITAL_COMMUNITY): Payer: Medicare Other

## 2018-12-26 ENCOUNTER — Ambulatory Visit (HOSPITAL_COMMUNITY): Payer: Medicare Other

## 2018-12-26 ENCOUNTER — Encounter (HOSPITAL_COMMUNITY): Payer: Medicare Other

## 2018-12-29 ENCOUNTER — Ambulatory Visit (HOSPITAL_COMMUNITY): Payer: Medicare Other

## 2018-12-29 ENCOUNTER — Encounter (HOSPITAL_COMMUNITY): Payer: Medicare Other

## 2018-12-31 ENCOUNTER — Ambulatory Visit (HOSPITAL_COMMUNITY): Payer: Medicare Other

## 2018-12-31 ENCOUNTER — Encounter (HOSPITAL_COMMUNITY): Payer: Medicare Other

## 2019-01-02 ENCOUNTER — Ambulatory Visit (HOSPITAL_COMMUNITY): Payer: Medicare Other

## 2019-01-02 ENCOUNTER — Encounter (HOSPITAL_COMMUNITY): Payer: Medicare Other

## 2019-01-05 ENCOUNTER — Ambulatory Visit (HOSPITAL_COMMUNITY): Payer: Medicare Other

## 2019-01-05 ENCOUNTER — Encounter (HOSPITAL_COMMUNITY): Payer: Medicare Other

## 2019-01-07 ENCOUNTER — Encounter (HOSPITAL_COMMUNITY): Payer: Medicare Other

## 2019-01-07 ENCOUNTER — Ambulatory Visit (HOSPITAL_COMMUNITY): Payer: Medicare Other

## 2019-01-09 ENCOUNTER — Encounter (HOSPITAL_COMMUNITY): Payer: Medicare Other

## 2019-01-09 ENCOUNTER — Ambulatory Visit (HOSPITAL_COMMUNITY): Payer: Medicare Other

## 2019-01-12 ENCOUNTER — Encounter (HOSPITAL_COMMUNITY): Payer: Medicare Other

## 2019-01-12 ENCOUNTER — Ambulatory Visit (HOSPITAL_COMMUNITY): Payer: Medicare Other

## 2019-01-14 ENCOUNTER — Ambulatory Visit (HOSPITAL_COMMUNITY): Payer: Medicare Other

## 2019-01-14 ENCOUNTER — Encounter (HOSPITAL_COMMUNITY): Payer: Medicare Other

## 2019-01-16 ENCOUNTER — Ambulatory Visit (HOSPITAL_COMMUNITY): Payer: Medicare Other

## 2019-01-16 ENCOUNTER — Encounter (HOSPITAL_COMMUNITY): Payer: Medicare Other

## 2019-01-19 ENCOUNTER — Ambulatory Visit (HOSPITAL_COMMUNITY): Payer: Medicare Other

## 2019-01-19 ENCOUNTER — Encounter (HOSPITAL_COMMUNITY): Payer: Medicare Other

## 2019-01-21 ENCOUNTER — Ambulatory Visit (HOSPITAL_COMMUNITY): Payer: Medicare Other

## 2019-01-21 ENCOUNTER — Encounter (HOSPITAL_COMMUNITY): Payer: Medicare Other

## 2019-01-23 ENCOUNTER — Ambulatory Visit (HOSPITAL_COMMUNITY): Payer: Medicare Other

## 2019-01-23 ENCOUNTER — Encounter (HOSPITAL_COMMUNITY): Payer: Medicare Other

## 2019-01-26 ENCOUNTER — Ambulatory Visit (HOSPITAL_COMMUNITY): Payer: Medicare Other

## 2019-01-26 ENCOUNTER — Encounter (HOSPITAL_COMMUNITY): Payer: Medicare Other

## 2019-01-28 ENCOUNTER — Encounter (HOSPITAL_COMMUNITY): Payer: Medicare Other

## 2019-01-28 ENCOUNTER — Ambulatory Visit (HOSPITAL_COMMUNITY): Payer: Medicare Other

## 2019-01-30 ENCOUNTER — Ambulatory Visit (HOSPITAL_COMMUNITY): Payer: Medicare Other

## 2019-01-30 ENCOUNTER — Encounter (HOSPITAL_COMMUNITY): Payer: Medicare Other

## 2019-02-02 ENCOUNTER — Ambulatory Visit (HOSPITAL_COMMUNITY): Payer: Medicare Other

## 2019-02-02 ENCOUNTER — Encounter (HOSPITAL_COMMUNITY): Payer: Medicare Other

## 2019-02-04 ENCOUNTER — Ambulatory Visit (HOSPITAL_COMMUNITY): Payer: Medicare Other

## 2019-02-04 ENCOUNTER — Encounter (HOSPITAL_COMMUNITY): Payer: Medicare Other

## 2019-02-06 ENCOUNTER — Encounter (HOSPITAL_COMMUNITY): Payer: Medicare Other

## 2019-02-06 ENCOUNTER — Ambulatory Visit (HOSPITAL_COMMUNITY): Payer: Medicare Other

## 2019-02-09 ENCOUNTER — Encounter (HOSPITAL_COMMUNITY): Payer: Medicare Other

## 2019-02-09 ENCOUNTER — Ambulatory Visit (HOSPITAL_COMMUNITY): Payer: Medicare Other

## 2019-02-10 ENCOUNTER — Other Ambulatory Visit: Payer: Self-pay | Admitting: Cardiology

## 2019-02-11 ENCOUNTER — Encounter (HOSPITAL_COMMUNITY): Payer: Medicare Other

## 2019-02-11 ENCOUNTER — Ambulatory Visit (HOSPITAL_COMMUNITY): Payer: Medicare Other

## 2019-02-13 ENCOUNTER — Ambulatory Visit (HOSPITAL_COMMUNITY): Payer: Medicare Other

## 2019-02-13 ENCOUNTER — Encounter (HOSPITAL_COMMUNITY): Payer: Medicare Other

## 2019-02-16 ENCOUNTER — Ambulatory Visit (HOSPITAL_COMMUNITY): Payer: Medicare Other

## 2019-02-16 ENCOUNTER — Encounter (HOSPITAL_COMMUNITY): Payer: Medicare Other

## 2019-02-18 ENCOUNTER — Ambulatory Visit (HOSPITAL_COMMUNITY): Payer: Medicare Other

## 2019-02-18 ENCOUNTER — Encounter (HOSPITAL_COMMUNITY): Payer: Medicare Other

## 2019-02-20 ENCOUNTER — Ambulatory Visit (HOSPITAL_COMMUNITY): Payer: Medicare Other

## 2019-02-20 ENCOUNTER — Encounter (HOSPITAL_COMMUNITY): Payer: Medicare Other

## 2019-03-25 ENCOUNTER — Telehealth: Payer: Self-pay | Admitting: Neurology

## 2019-03-25 NOTE — Telephone Encounter (Signed)
Pt canceled 03/30/19 appt with Posey Pronto. Pt states he is doing fine and wants to wait until later date/time. Pt did not want to resch at this time.

## 2019-03-30 ENCOUNTER — Ambulatory Visit: Payer: Medicare Other | Admitting: Neurology

## 2019-04-27 ENCOUNTER — Telehealth: Payer: Self-pay

## 2019-04-27 NOTE — Telephone Encounter (Signed)
Spoke to patient about email he sent this morning.He stated he had chest pain over the weekend that was relieved by NTG x 1 .Stated he has not had any more chest pain and feels good.He refused a appointment.Advised to keep appointment already scheduled with Dr.[name] 9/10 at 10:00 am.Advised to call sooner if needed.

## 2019-05-06 ENCOUNTER — Other Ambulatory Visit: Payer: Self-pay | Admitting: Cardiology

## 2019-05-13 ENCOUNTER — Other Ambulatory Visit: Payer: Self-pay | Admitting: Cardiology

## 2019-06-04 ENCOUNTER — Other Ambulatory Visit: Payer: Self-pay

## 2019-06-04 DIAGNOSIS — Z79899 Other long term (current) drug therapy: Secondary | ICD-10-CM

## 2019-06-04 DIAGNOSIS — I480 Paroxysmal atrial fibrillation: Secondary | ICD-10-CM

## 2019-06-04 DIAGNOSIS — E7800 Pure hypercholesterolemia, unspecified: Secondary | ICD-10-CM

## 2019-06-04 DIAGNOSIS — I10 Essential (primary) hypertension: Secondary | ICD-10-CM

## 2019-06-04 NOTE — Progress Notes (Signed)
Cardiology Office Note   Date:  06/11/2019   ID:  Marcus Kaufmann Sr., DOB July 07, 1932, MRN [id number]  PCP:  [name], MD  Cardiologist:   Rayjon Wery [place], MD   Chief Complaint  Patient presents with  . Other    12 month follow up. Patient c/o some chest discomfort that goes away with nitro. Meds reviewed verbally with patient.       History of Present Illness: Marcus LANZO Sr. is a 85 y.o. male who is seen for follow up CAD. He has a history of myasthenia gravis and remote PAF in the setting of pneumonia July 2016.  He presented 08/20/2018 with a non-ST elevation MI.  08/21/2018 he underwent diagnostic catheterization and intervention with a DES to his RCA.   Patient did have a residual total LAD that Dr [name] attempted to cross but it appeared the wire got subintimal and so the procedure was aborted. He also has a 60% circumflex.  Echocardiogram showed an EF of 40% with moderate to severe MR.  Though his LVEDP at cath was normal he was felt to be volume overloaded.  He was diuresed and sent home on a diuretic as well.    On initial follow up he complained of persistent SOB. Was seen by Dr. Melvyn Novas who felt this was not a pulmonary issue. Later switched from Brilinta to Plavix with significant improvement in symptoms. He was considered for potential CTO PCI of the LAD but since he was asymptomatic this was not recommended.   On follow up today he is doing well. He denies any chest pain or dyspnea. No edema. Does note his legs fatigue easily but no pain. He is fairly sedentary.  He reports BP at home 140/75-80.  Has been following a Statistician Dr. Cyd Silence type diet and states he doesn't cheat. Reports some type of immune response to statins and/or beta blockers in the past that required him to be on steroids for 2 years. Refuses to take Zetia based on what he has seen on the internet.   Past Medical History:  Diagnosis Date  . Acute on chronic combined systolic and diastolic  CHF (congestive heart failure) ([place]) 08/22/2018  . Acute on chronic respiratory failure ([place]) 04/15/2015  . CAD in native artery 08/22/2018  . CAP (community acquired pneumonia) 04/13/2015   See cxr 04/13/2015 > admit [facility]    . H/O hiatal hernia   . Hyperlipidemia LDL goal <70 08/22/2018  . Hypertension 08/22/2018  . Hypothyroidism 04/13/2015  . Myasthenia gravis ([place]) 04/13/2015  . Obesity 04/13/2015  . Paroxysmal A-fib ([place]) 04/16/2015  . S/P angioplasty with stent 08/21/18 DES to RCA 08/22/2018  . Sepsis ([place]) 04/15/2015    Past Surgical History:  Procedure Laterality Date  . AMPUTATION  06/11/2012   Procedure: AMPUTATION DIGIT;  Surgeon: [name], MD;  Location: [facility];  Service: Orthopedics;  Laterality: Left;  revision of amputation  . CORONARY STENT INTERVENTION N/A 08/21/2018   Procedure: CORONARY STENT INTERVENTION;  Surgeon: [name], MD;  Location: [facility];  Service: Cardiovascular;  Laterality: N/A;  . HERNIA REPAIR  1994  . LEFT HEART CATH AND CORONARY ANGIOGRAPHY N/A 08/21/2018   Procedure: LEFT HEART CATH AND CORONARY ANGIOGRAPHY;  Surgeon: [name], MD;  Location: [facility];  Service: Cardiovascular;  Laterality: N/A;  . ORIF SHOULDER FRACTURE  06/13/2012   Procedure: OPEN REDUCTION INTERNAL FIXATION (ORIF) SHOULDER FRACTURE;  Surgeon: [name],  MD;  Location: [facility];  Service: Orthopedics;  Laterality: Left;  LEFT SHOULDER OPEN GREATER TUBEROSITY ORIF  . SHOULDER CLOSED REDUCTION  06/11/2012   Procedure: CLOSED MANIPULATION SHOULDER;  Surgeon: [name], MD;  Location: [facility];  Service: Orthopedics;  Laterality: Left;     Current Outpatient Medications  Medication Sig Dispense Refill  . aspirin EC 81 MG EC tablet Take 1 tablet (81 mg total) by mouth daily.    . busPIRone (BUSPAR) 5 MG tablet Take 5 mg by mouth 2 (two) times daily.  0  . clopidogrel (PLAVIX) 75 MG tablet TAKE 4 TABLETS BY MOUTH TOMORROW(STARTING DOSE), THEN  FURTHERMORE TAKE 1 TABLET BY MOUTH DAILY 30 tablet 11  . furosemide (LASIX) 40 MG tablet TAKE 1 TABLET BY MOUTH EVERY DAY 90 tablet 0  . levothyroxine (SYNTHROID, LEVOTHROID) 100 MCG tablet Take 100 mcg by mouth daily.   3  . losartan (COZAAR) 50 MG tablet TAKE 1 TABLET BY MOUTH EVERY DAY 90 tablet 1  . nitroGLYCERIN (NITROSTAT) 0.4 MG SL tablet Place 1 tablet (0.4 mg total) under the tongue every 5 (five) minutes x 3 doses as needed for chest pain. 25 tablet 4  . predniSONE (DELTASONE) 5 MG tablet TAKE 1 TABLET BY MOUTH DAILY WITH BREAKFAST. TAKE EXTRA TABLET AS NEEDED 120 tablet 1  . pyridostigmine (MESTINON) 60 MG tablet Take 1 tablet (60 mg total) by mouth daily. 90 tablet 3   No current facility-administered medications for this visit.     Allergies:   Beta adrenergic blockers and Statins    Social History:  The patient  reports that he quit smoking about 37 years ago. His smoking use included cigarettes. He has a 30.00 pack-year smoking history. He has never used smokeless tobacco. He reports current alcohol use of about 2.0 standard drinks of alcohol per week. He reports that he does not use drugs.   Family History:  The patient's family history includes Healthy in his daughter, sister, and son; Heart attack in his father; Other in his mother.    ROS:  Please see the history of present illness.   Otherwise, review of systems are positive for none.   All other systems are reviewed and negative.    PHYSICAL EXAM: VS:  BP (!) 182/86 (BP Location: Left Arm, Patient Position: Sitting, Cuff Size: Normal)   Pulse (!) 56   Ht _0  (1.727 m)   Wt 160 lb (72.6 kg)   BMI 24.33 kg/m  , BMI Body mass index is 24.33 kg/m. GEN: Well nourished, well developed, in no acute distress  HEENT: normal  Neck: no JVD, carotid bruits, or masses Cardiac: RRR; no murmurs, rubs, or gallops,no edema  Respiratory:  clear to auscultation bilaterally, normal work of breathing GI: soft, nontender,  nondistended, + BS MS: no deformity or atrophy. Pedal pulses are excellent. Skin: warm and dry, no rash Neuro:  Strength and sensation are intact Psych: euthymic mood, full affect   EKG:  EKG is  ordered today. The ekg ordered today demonstrates NSR with RBBB, LAD, LVH. I have personally reviewed and interpreted this study.   Lab Results  Component Value Date   WBC 7.2 09/01/2018   HGB 14.2 09/01/2018   HCT 42.0 09/01/2018   PLT 127.0 (L) 09/01/2018   GLUCOSE 93 09/01/2018   CHOL 128 06/05/2019   TRIG 109 06/05/2019   HDL 41 06/05/2019   LDLCALC 96 08/21/2018   ALT 9 06/05/2019   AST 16 06/05/2019  NA 135 09/01/2018   K 4.6 09/01/2018   CL 101 09/01/2018   CREATININE 1.24 09/01/2018   BUN 25 (H) 09/01/2018   CO2 28 09/01/2018   TSH 1.28 10/03/2016      Wt Readings from Last 3 Encounters:  06/11/19 160 lb (72.6 kg)  12/03/18 165 lb 5.5 oz (75 kg)  11/13/18 166 lb 10.7 oz (75.6 kg)      Other studies Reviewed: Additional studies/ records that were reviewed today include:   Echo 10/22/16: Study Conclusions  - Left ventricle: The cavity size was normal. There was moderate   concentric hypertrophy. Systolic function was normal. The   estimated ejection fraction was in the range of 55% to 60%. Wall   motion was normal; there were no regional wall motion   abnormalities. Features are consistent with a pseudonormal left   ventricular filling pattern, with concomitant abnormal relaxation   and increased filling pressure (grade 2 diastolic dysfunction). - Aortic valve: There was mild regurgitation. Valve area (VTI):   2.17 cm^2. - Mitral valve: Calcified annulus. There was mild regurgitation. - Left atrium: The atrium was moderately dilated. - Pulmonary arteries: PA peak pressure: 32 mm Hg (S).   Echo 08/21/18: Study Conclusions  - Left ventricle: The cavity size was normal. Wall thickness was   increased in a pattern of mild LVH. Mid to apical inferior    hypokinesis. Hypokinesis of the apex. The estimated ejection   fraction was 45%. Features are consistent with a pseudonormal   left ventricular filling pattern, with concomitant abnormal   relaxation and increased filling pressure (grade 2 diastolic   dysfunction). - Aortic valve: Trileaflet; moderately calcified leaflets. There   was mild stenosis. There was moderate eccentric regurgitation.   Mean gradient (S): 10 mm Hg. Valve area (VTI): 1.73 cm^2. - Mitral valve: Moderately calcified annulus. Mildly calcified   leaflets. There is some degree of restriction of the posterior   leaflet. There was moderate to severe regurgitation. - Left atrium: The atrium was moderately dilated. - Right ventricle: The cavity size was normal. Systolic function   was normal. - Right atrium: The atrium was moderately to severely dilated. - Tricuspid valve: There was moderate regurgitation. Peak RV-RA   gradient 39 mmHg. - Pulmonary arteries: PA peak pressure: 47 mm Hg (S). - Systemic veins: IVC measured 2.5 cm with > 50% respirophasic   variation, suggesting RA pressure 8 mmHg.  Impressions:  - Normal LV size with mild LV hypertrophy. EF 45% with mid to   apical inferior hypokinesis and hypokinesis of the apex. Moderate   diastolic dysfunction. Normal RV size and systolic function.   Biatrial enlargement. Moderate to severe mitral regurgitation   with restriction of the posterior leaflet. Mild aortic stenosis   with moderate eccentric regurgitation. Mild pulmonary   hypertension.   Cardiac cath/PCI: 08/21/18: CORONARY STENT INTERVENTION  LEFT HEART CATH AND CORONARY ANGIOGRAPHY  Conclusion     Prox RCA lesion is 95% stenosed.  Ost Cx lesion is 60% stenosed.  Prox Cx lesion is 60% stenosed.  Prox LAD lesion is 100% stenosed.  A drug-eluting stent was successfully placed.  Post intervention, there is a 0% residual stenosis.  There is moderate left ventricular systolic dysfunction.   LV end diastolic pressure is mildly elevated.  The left ventricular ejection fraction is 35-45% by visual estimate.   Marcus ANSON PEDDIE Sr. is a 85 y.o. male    [id number] LOCATION:  FACILITY: [facility]  PHYSICIAN: Quay Burow, M.D. Aug 11, 1932  DATE OF PROCEDURE:  08/21/2018  DATE OF DISCHARGE:     CARDIAC CATHETERIZATION / PCI DES RCA    History obtained from chart review.Mr. Vitelli is an 81M with paroxysmal atrial fibrillation,myastheniagravis, hyperlipidemia and thrombocytopenia here with NSTEMI.    PROCEDURE DESCRIPTION:   The patient was brought to the second floor [facility] cath lab in the postabsorptive state. He was premedicated with IV Versed and fentanyl. His right wristwas prepped and shaved in usual sterile fashion. Xylocaine 1% was used for local anesthesia. A 6 French sheath was inserted into the right radial artery using standard Seldinger technique. The patient received 4000units  of heparin intravenously.  A 5 [place] TIG catheter and pigtail catheters were used for selective coronary angiography and left ventriculography respectively.  Isovue dye was used for the entirety of the case.  Retrograde aorta, left ventricular and pullback pressures were recorded.  Radial cocktail was administered via the SideArm sheath.  Patient received Brilinta 180 mg p.o. followed by Angiomax bolus and infusion with a therapeutic ACT demonstrated.  Using a 6 [place] JR4 guide catheter, 0.14 pro-water guidewire and a 2 mm x 12 mm balloon predilatation was performed to the proximal dominant RCA.  I then placed a 3 mm x 60 mm long Synergy drug-eluting stent across the diseased segment and deployed at 14 to 16 atm.  I then postdilated with a 3.25 x 12 mm long noncompliant balloon up to 16 atm (3.3 mm) resulting reduction with 95% ulcerated proximal dominant RCA plaque to 0% residual.  There was distal spasm resulting in ST segment elevation which resolved with  administration of intracoronary nitroglycerin.  I then attempted to cross the mid LAD CTO using a 6 [place] XB LAD 4 cm curve guide catheter, pro-water and whisper wire and 1.5 mm balloon.  I did ultimately cross the lesion but I did not think I was intraluminal and ultimately aborted the procedure.   IMPRESSION: Successful proximal RCA PCI and drug-eluting stenting using a synergy drug-eluting stent postdilated to 3.3 mm with unsuccessful attempt at crossing mid LAD CTO.  His LV function is moderately reduced in the 40% range.  His LAD CTO can be treated in a staged fashion electively.  The sheath was removed and a TR band was placed on the right wrist to achieve patent hemostasis.  The patient left the lab in stable condition.  Quay Burow. MD, [facility] 08/21/2018 5:20 PM     Recommend uninterrupted dual antiplatelet therapy with Aspirin 43m daily and Ticagrelor 921mtwice daily for a minimum of 12 months (ACS - Class I recommendation).     ASSESSMENT AND PLAN:  1.  CAD with s/p NSTEMI in November 2019 secondary to ruptured plaque lesion in the RCA. S/p DES. He has CTO of the mid LAD but is  asymptomatic. Review of cath and Echo data show wall motion abnormality limited to the inferior wall. Given lack of symptoms or LV dysfunction in the anterior wall I do not see an indication to attempt CTO PCI of the LAD. Will continue DAPT with ASA and Plavix for one year. He can stop Plavix after November 21. Intolerant of Brilinta.   2. Hyperlipidemia. LDL now 67 at goal on dietary therapy. Given prior reaction apparently to statins will focus on dietary modification for now.   3. LV dysfunction acutely at time of MI. EF 45%. Continue ARB. Euvolemic. No beta blocker due to history of intolerance. Recommend reducing lasix to 20 mg daily.   4.  HTN. Per home BP readings this is controlled.   5. Myastenia gravis in remission  6. Hypothyroidism.    Current medicines are reviewed at length  with the patient today.  The patient does not have concerns regarding medicines.  The following changes have been made:  no change  Labs/ tests ordered today include:   No orders of the defined types were placed in this encounter.    Disposition:   FU with me in 6 months.  Signed, Karsynn Deweese [place], MD  06/11/2019 10:19 AM    [facility] [address] [place], [place], [address] Phone [phone], Fax [phone]

## 2019-06-04 NOTE — Progress Notes (Signed)
c-met  

## 2019-06-05 DIAGNOSIS — E7800 Pure hypercholesterolemia, unspecified: Secondary | ICD-10-CM | POA: Diagnosis not present

## 2019-06-05 DIAGNOSIS — I10 Essential (primary) hypertension: Secondary | ICD-10-CM | POA: Diagnosis not present

## 2019-06-05 DIAGNOSIS — Z79899 Other long term (current) drug therapy: Secondary | ICD-10-CM | POA: Diagnosis not present

## 2019-06-05 DIAGNOSIS — I480 Paroxysmal atrial fibrillation: Secondary | ICD-10-CM | POA: Diagnosis not present

## 2019-06-11 ENCOUNTER — Other Ambulatory Visit: Payer: Self-pay

## 2019-06-11 ENCOUNTER — Encounter: Payer: Self-pay | Admitting: Cardiology

## 2019-06-11 ENCOUNTER — Ambulatory Visit (INDEPENDENT_AMBULATORY_CARE_PROVIDER_SITE_OTHER): Payer: Medicare Other | Admitting: Cardiology

## 2019-06-11 VITALS — BP 182/86 | HR 56 | Ht 68.0 in | Wt 160.0 lb

## 2019-06-11 DIAGNOSIS — I2510 Atherosclerotic heart disease of native coronary artery without angina pectoris: Secondary | ICD-10-CM | POA: Diagnosis not present

## 2019-06-11 DIAGNOSIS — I10 Essential (primary) hypertension: Secondary | ICD-10-CM | POA: Diagnosis not present

## 2019-06-11 DIAGNOSIS — E7800 Pure hypercholesterolemia, unspecified: Secondary | ICD-10-CM | POA: Diagnosis not present

## 2019-06-11 DIAGNOSIS — I5022 Chronic systolic (congestive) heart failure: Secondary | ICD-10-CM | POA: Diagnosis not present

## 2019-06-11 LAB — HEPATIC FUNCTION PANEL
ALT: 9 IU/L (ref 0–44)
AST: 16 IU/L (ref 0–40)
Albumin: 4.2 g/dL (ref 3.6–4.6)
Alkaline Phosphatase: 135 IU/L — ABNORMAL HIGH (ref 39–117)
Bilirubin Total: 0.3 mg/dL (ref 0.0–1.2)
Bilirubin, Direct: 0.1 mg/dL (ref 0.00–0.40)
Total Protein: 6.5 g/dL (ref 6.0–8.5)

## 2019-06-11 LAB — LIPID PANEL
Chol/HDL Ratio: 3.1 ratio (ref 0.0–5.0)
Cholesterol, Total: 128 mg/dL (ref 100–199)
HDL: 41 mg/dL (ref >39)
LDL Chol Calc (NIH): 67 mg/dL (ref 0–99)
Triglycerides: 109 mg/dL (ref 0–149)
VLDL Cholesterol Cal: 20 mg/dL (ref 5–40)

## 2019-06-11 LAB — VITAMIN D 1,25 DIHYDROXY
Vitamin D 1, 25 (OH)2 Total: 50 pg/mL
Vitamin D2 1, 25 (OH)2: <10 pg/mL
Vitamin D3 1, 25 (OH)2: 45 pg/mL

## 2019-06-11 MED ORDER — LOSARTAN POTASSIUM 50 MG PO TABS: 50.0000 mg | ORAL_TABLET | Freq: Every day | ORAL | 1 refills | Status: DC

## 2019-06-11 MED ORDER — FUROSEMIDE 20 MG PO TABS: ORAL_TABLET | ORAL | 3 refills | Status: DC

## 2019-06-11 NOTE — Patient Instructions (Addendum)
Reduce lasix to 20 mg daily  You may stop Plavix after November 21   Continue your other therapy  Follow up in 6 months.

## 2019-07-11 DIAGNOSIS — Z23 Encounter for immunization: Secondary | ICD-10-CM | POA: Diagnosis not present

## 2019-07-16 ENCOUNTER — Other Ambulatory Visit: Payer: Self-pay

## 2019-07-16 NOTE — Patient Outreach (Signed)
Incoming call for [employer]. Referred to [facility].

## 2019-07-22 ENCOUNTER — Other Ambulatory Visit: Payer: Self-pay

## 2019-07-22 NOTE — Patient Outreach (Signed)
[facility]) Care Management  07/22/2019  Marcus SAMMARCO Sr. 1931/10/28 [id number]   Telephone Screen Referral Date : 07/16/19 Referral Source:EMMI Prevent Referral Reason:Assessment of needed services Insurance: Medicare    Outreach attempt # 1 to patient. .  Patient able to verify HIPAA.  Explained to the patient health coach role and [facility] services.  Patient is open to the call and completed screening.  Social: The patient is a 85 y/o male who lives alone and rates his health as very good.  He states that his daughter Stanton Kidney who is 50 stays 10 minutes away from him if he needs help.  He states that he is independent with his daily living activities.  He denies any falls.  He states that he had his flu shot last week.  He reports that he has transportation to his appointments.  The only equipment that he reports having in the home is eyeglasses that he uses for reading only.   Conditions: The patient denies any condition except for having a stent placed in his heart last year.  Medications:The patient reports having six medications that he takes.  He reports that it will go to five because he is on a blood thinner that will be discontinued next week.   Plan: RN Health Coach will close the case at this time due to no further interventions needed.  The patient states " I am glad to know that I have these services available if I need them"  I will send the patient a successful letter and pamphlet for future reference.  Lazaro Arms RN, BSN, [facility] Direct Dial:  [phone] Fax: [phone]

## 2019-08-08 IMAGING — DX DG CHEST 2V
2 series · 2 of 2 positions shown · non-contrast
Comparison: 02/12/2018

CLINICAL DATA: Chest pain for 1 week

EXAM:
CHEST - 2 VIEW

[w chest pa]
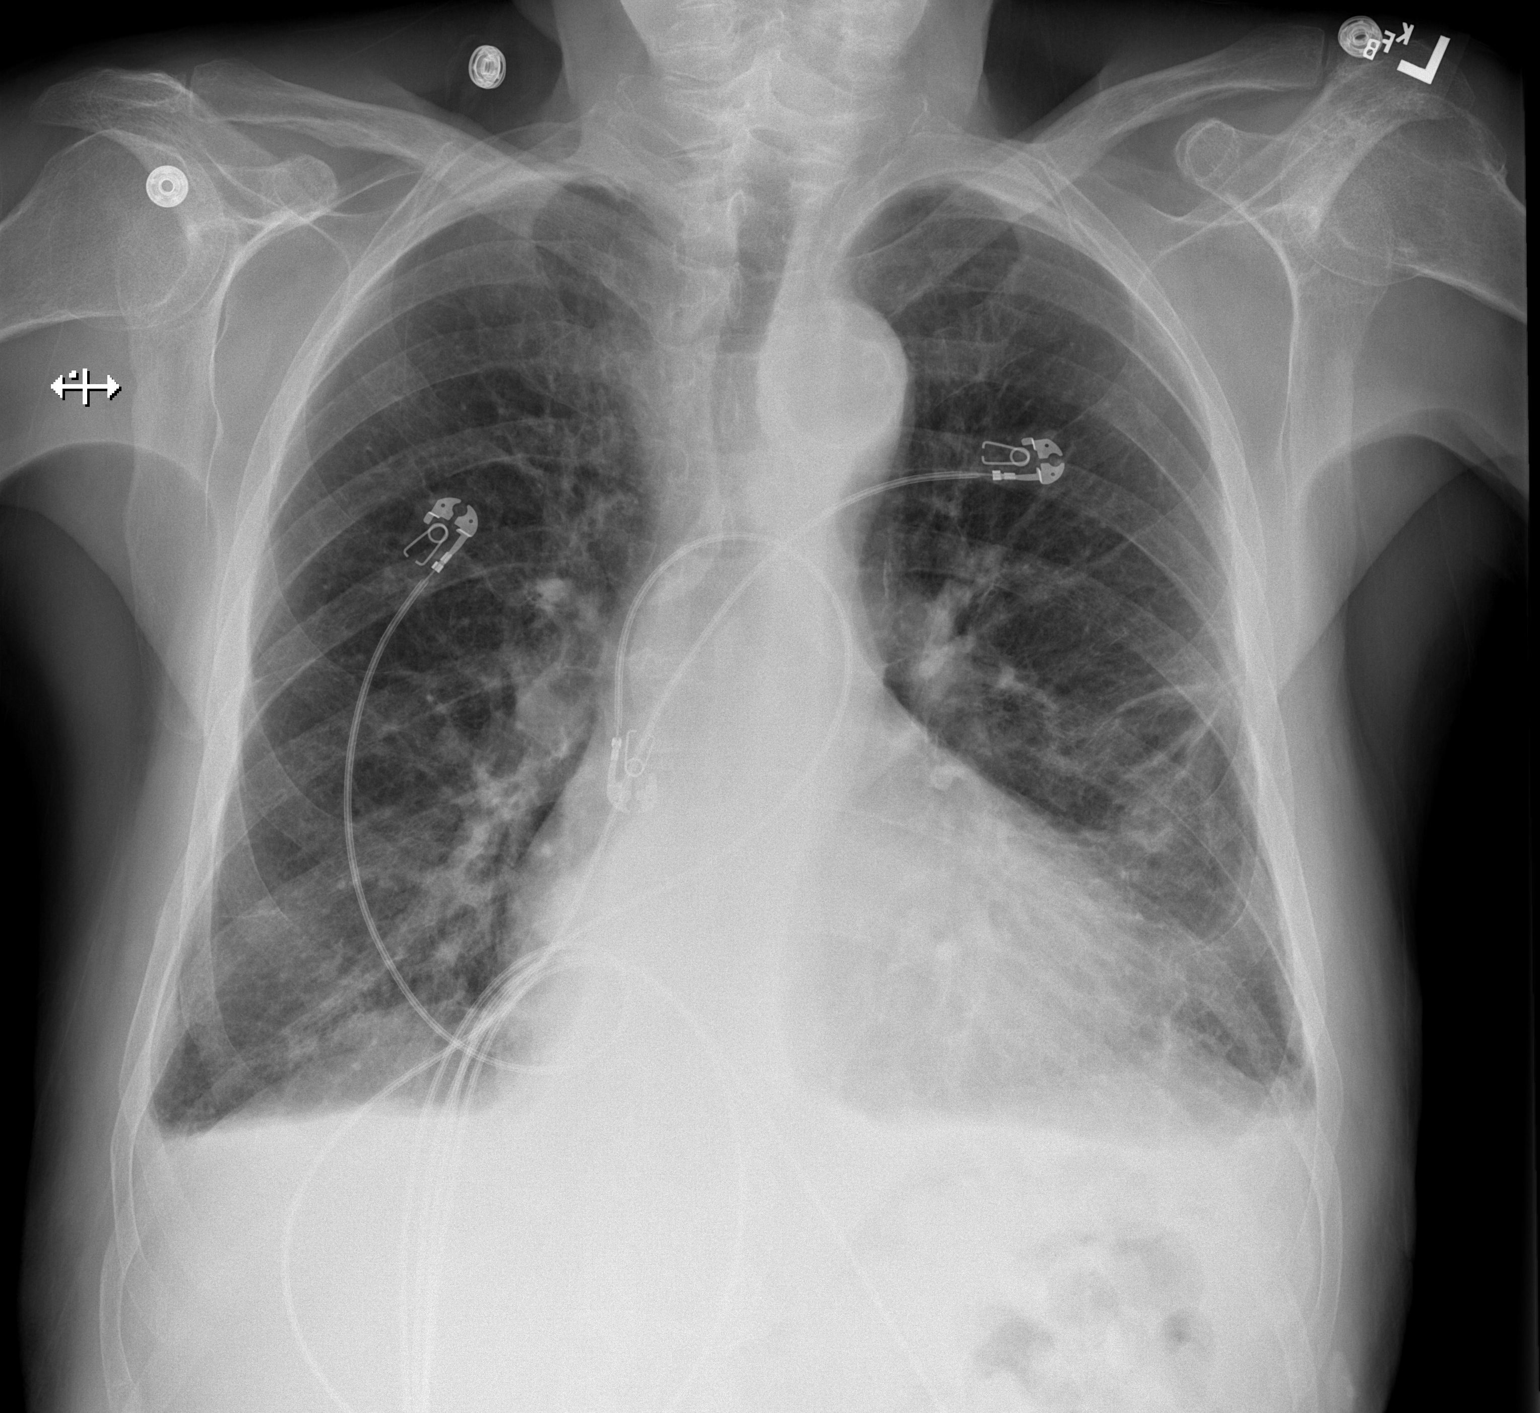

[w chest lat]
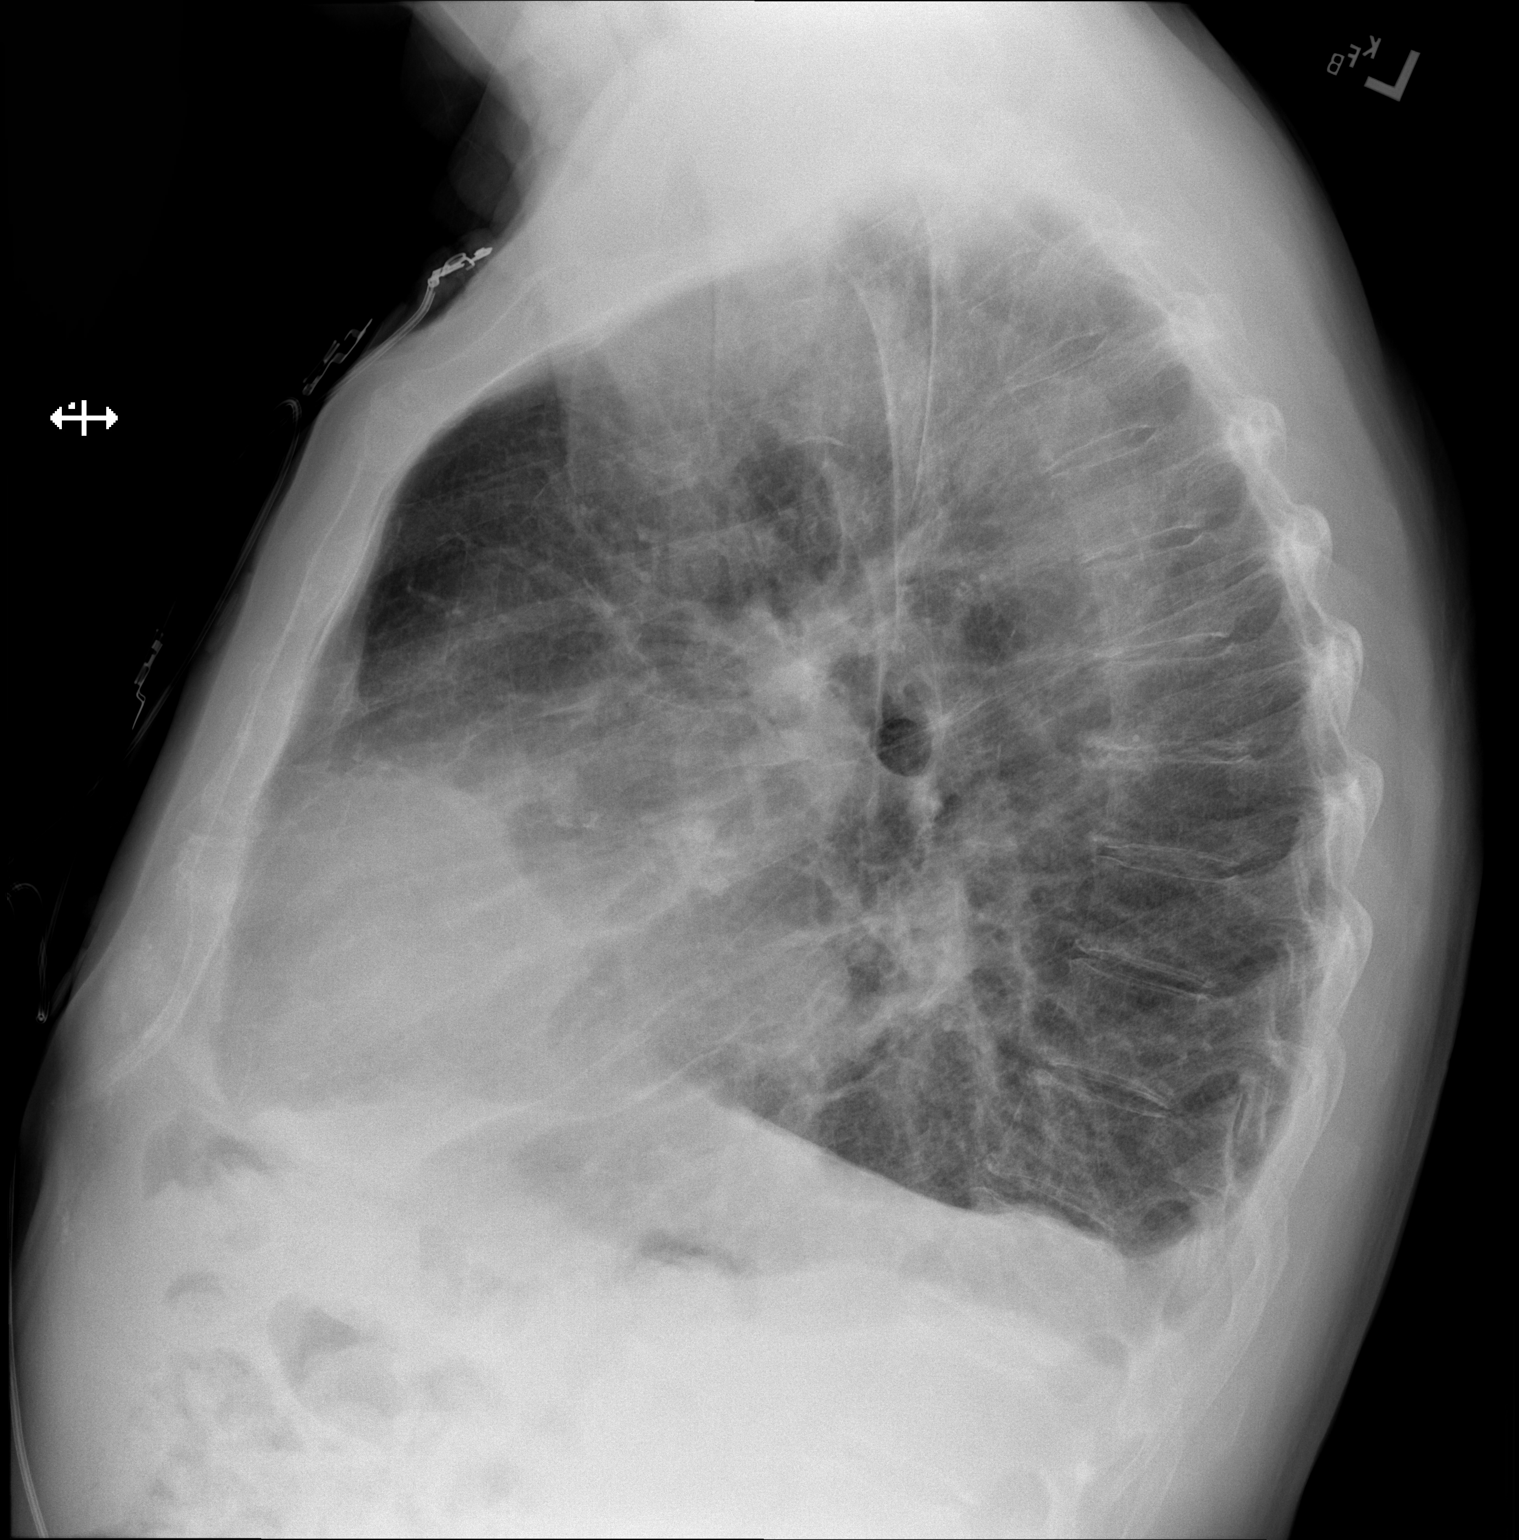

[2 of 2 positions shown; findings below may reference images not displayed]

FINDINGS: Bilateral interstitial and alveolar airspace opacities primarily at
the lung bases. Small bilateral pleural effusions. No pneumothorax.
Stable cardiomegaly. No acute osseous abnormality.
IMPRESSION: Small bilateral pleural effusions and bilateral interstitial and
alveolar airspace opacities. Differential considerations include
bilateral lower lobe pneumonia versus mild pulmonary edema.

## 2019-08-20 IMAGING — DX DG CHEST 2V
2 series · 2 of 2 positions shown · non-contrast
Comparison: 08/20/2018

CLINICAL DATA: Dyspnea status post stent placement x1 week.

EXAM:
CHEST - 2 VIEW

[chest pa]
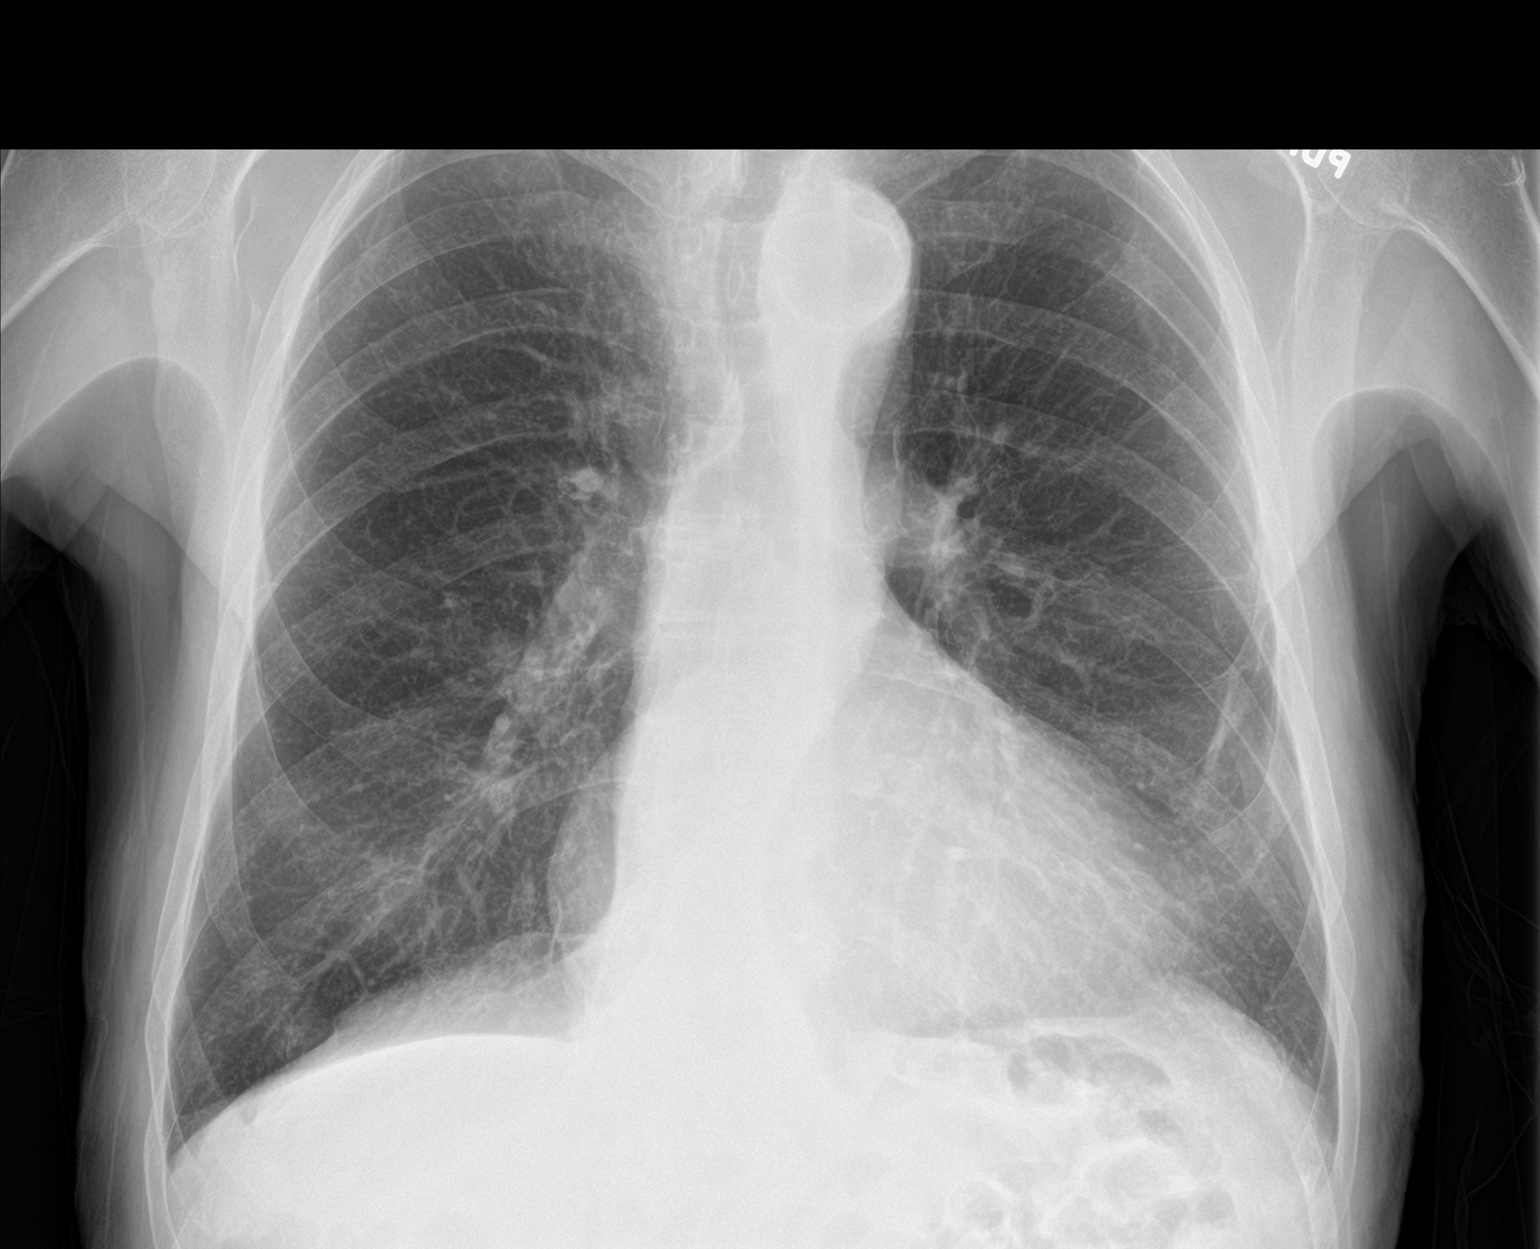

[chest lat]
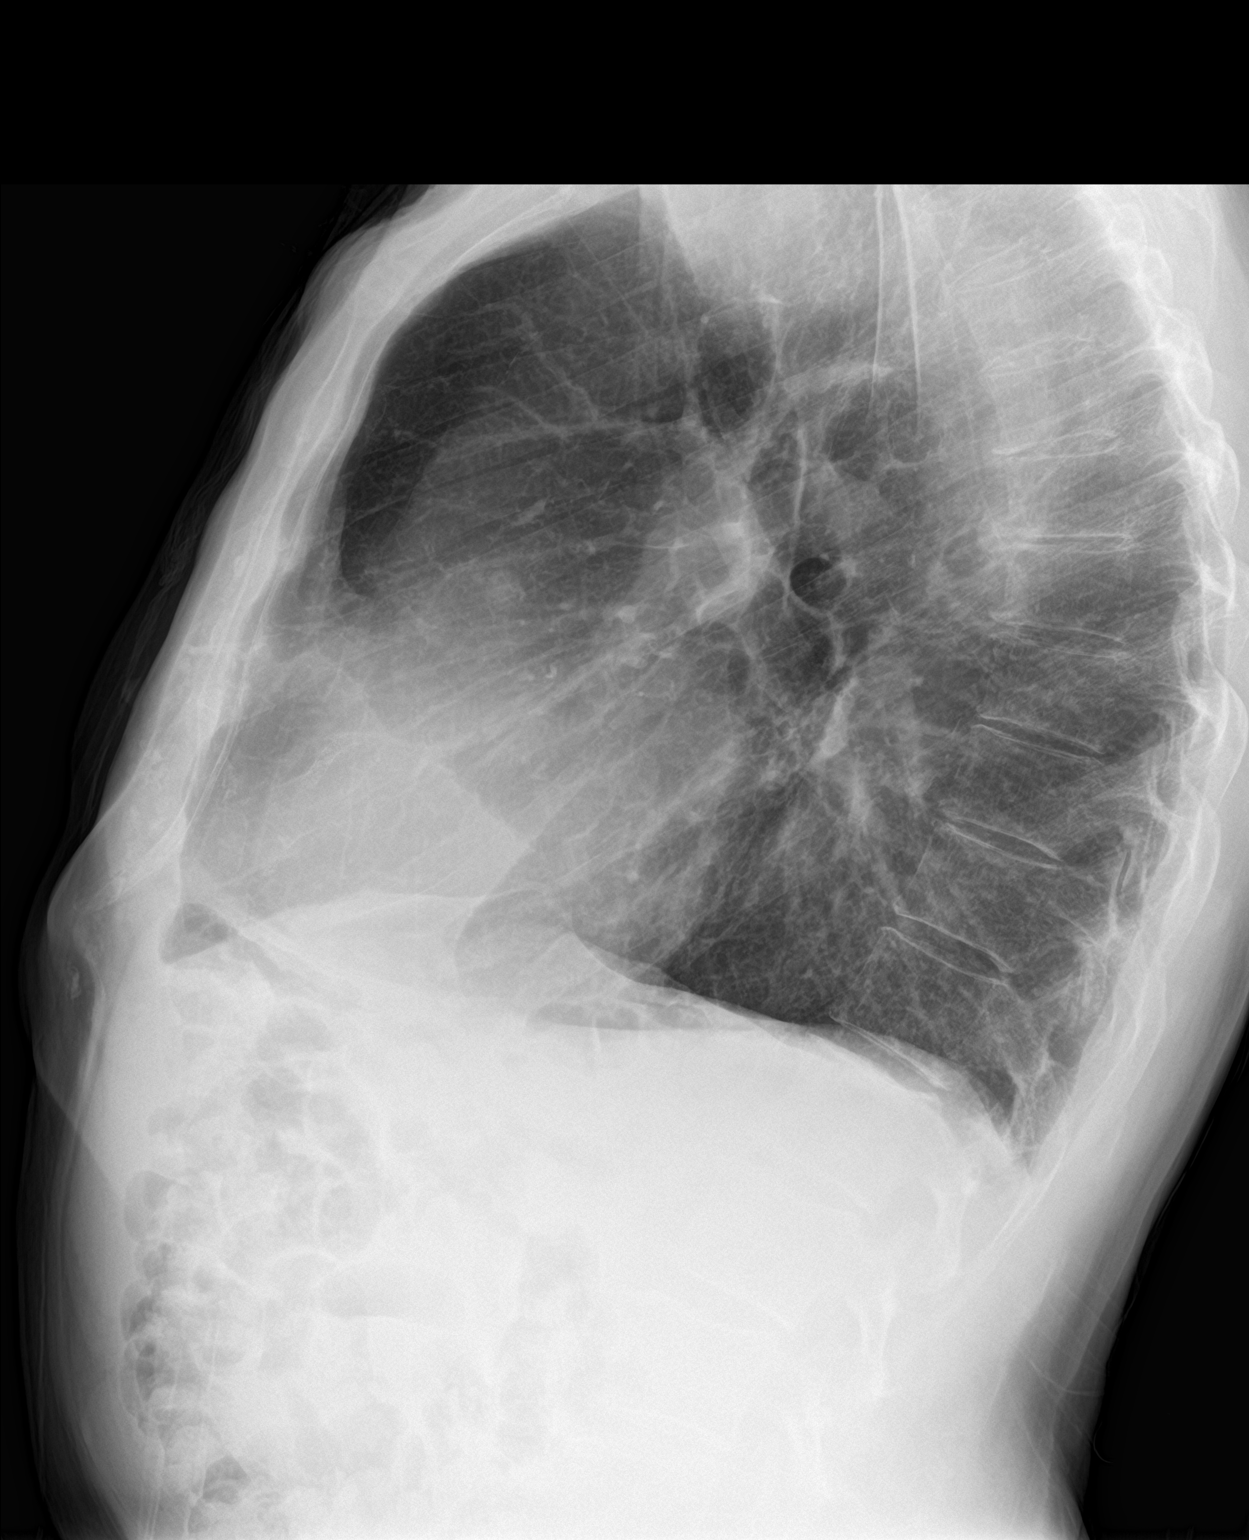

[2 of 2 positions shown; findings below may reference images not displayed]

FINDINGS: Stable cardiomegaly with tortuous atherosclerotic aorta. Stable
subpleural scarring along the periphery the left upper lobe and
lingula. There is emphysematous hyperinflation of the lungs, upper
lobe predominant. No pulmonary consolidation. Resolution of
previously noted bilateral small pleural effusions. No overt
pulmonary edema. Degenerative changes are present about both
shoulders and the thoracic spine.
IMPRESSION: Stable cardiomegaly with atherosclerotic aorta. Hyperinflated lungs
compatible with emphysema. No acute pulmonary consolidation nor
overt pulmonary edema.

## 2019-10-23 ENCOUNTER — Ambulatory Visit: Payer: Medicare Other | Attending: Internal Medicine

## 2019-10-23 DIAGNOSIS — Z23 Encounter for immunization: Secondary | ICD-10-CM | POA: Insufficient documentation

## 2019-10-23 NOTE — Progress Notes (Signed)
   [id number] [facility]  Name:  Marcus WILDEN Sr.    MRN: [id number] DOB: Oct 13, 1931  10/23/2019  [name] was observed post Covid-19 immunization for 15 minutes without incidence. He was provided with Vaccine Information Sheet and instruction to access the V-Safe system.   [name] was instructed to call 911 with any severe reactions post vaccine: Marland Kitchen Difficulty breathing  . Swelling of your face and throat  . A fast heartbeat  . A bad rash all over your body  . Dizziness and weakness    Immunizations Administered    Name Date Dose VIS Date Route   Pfizer COVID-19 Vaccine 10/23/2019 10:05 AM 0.3 mL 09/11/2019 Intramuscular   Manufacturer: [place]   Lot: [id number]   [place]: [id number]

## 2019-11-12 ENCOUNTER — Other Ambulatory Visit: Payer: Self-pay

## 2019-11-12 MED ORDER — LOSARTAN POTASSIUM 50 MG PO TABS: 50.0000 mg | ORAL_TABLET | Freq: Every day | ORAL | 0 refills | Status: DC

## 2019-11-13 ENCOUNTER — Ambulatory Visit: Payer: Medicare Other | Attending: Internal Medicine

## 2019-11-13 DIAGNOSIS — Z23 Encounter for immunization: Secondary | ICD-10-CM | POA: Insufficient documentation

## 2019-11-13 NOTE — Progress Notes (Signed)
   [id number] Vaccination Clinic  Name:  Marcus SEMEDO Sr.    MRN: [id number] DOB: 03/19/1932  11/13/2019  Marcus [name] was observed post Covid-19 immunization for 15 minutes without incidence. He was provided with Vaccine Information Sheet and instruction to access the V-Safe system.   Marcus [name] was instructed to call 911 with any severe reactions post vaccine: Marland Kitchen Difficulty breathing  . Swelling of your face and throat  . A fast heartbeat  . A bad rash all over your body  . Dizziness and weakness    Immunizations Administered    Name Date Dose VIS Date Route   Pfizer COVID-19 Vaccine 11/13/2019 11:01 AM 0.3 mL 09/11/2019 Intramuscular   Manufacturer: [place]   Lot: [id number]   [place]: [id number]

## 2019-11-29 ENCOUNTER — Ambulatory Visit: Payer: Medicare Other

## 2019-11-30 ENCOUNTER — Ambulatory Visit (INDEPENDENT_AMBULATORY_CARE_PROVIDER_SITE_OTHER): Payer: Medicare Other | Admitting: Cardiology

## 2019-11-30 ENCOUNTER — Encounter: Payer: Self-pay | Admitting: Cardiology

## 2019-11-30 ENCOUNTER — Other Ambulatory Visit: Payer: Self-pay

## 2019-11-30 VITALS — BP 119/79 | HR 69 | Temp 97.9°F | Ht 67.0 in | Wt 162.2 lb

## 2019-11-30 DIAGNOSIS — I2510 Atherosclerotic heart disease of native coronary artery without angina pectoris: Secondary | ICD-10-CM

## 2019-11-30 DIAGNOSIS — I10 Essential (primary) hypertension: Secondary | ICD-10-CM | POA: Diagnosis not present

## 2019-11-30 DIAGNOSIS — E7800 Pure hypercholesterolemia, unspecified: Secondary | ICD-10-CM

## 2019-11-30 NOTE — Progress Notes (Signed)
Cardiology Office Note    Date:  11/30/2019   ID:  Marcus Kaufmann Sr., DOB 1932/04/08, MRN [id number]  PCP:  [name], MD  Cardiologist:   Nixie Laube Martinique, MD   No chief complaint on file.     History of Present Illness: [name] is a 86 y.o. male who is seen for follow up CAD. He has a history of myasthenia gravis and remote PAF in the setting of pneumonia July 2016.  He presented 08/20/2018 with a non-ST elevation MI.  08/21/2018 he underwent diagnostic catheterization and intervention with a DES to his RCA.   Patient did have a residual total LAD that Dr [name] attempted to cross but it appeared the wire got subintimal and so the procedure was aborted. He also has a 60% circumflex.  Echocardiogram showed an EF of 40% with moderate to severe MR.  Though his LVEDP at cath was normal he was felt to be volume overloaded.  He was diuresed and sent home on a diuretic as well.    On initial follow up he complained of persistent SOB. Was seen by Dr. Melvyn Novas who felt this was not a pulmonary issue. Later switched from Brilinta to Plavix with significant improvement in symptoms. He was considered for potential CTO PCI of the LAD but since he was asymptomatic this was not recommended.   On follow up today he is doing very well. He denies any chest pain or dyspnea. No edema. Does note his legs fatigue easily in his calves but no pain. BP is well controlled.  He continues to follow a plant based diet and states he doesn't cheat. Reports some type of immune response to statins and/or beta blockers in the past that required him to be on steroids for 2 years.    Past Medical History:  Diagnosis Date  . Acute on chronic combined systolic and diastolic CHF (congestive heart failure) ([place]) 08/22/2018  . Acute on chronic respiratory failure ([place]) 04/15/2015  . CAD in native artery 08/22/2018  . CAP (community acquired pneumonia) 04/13/2015   See cxr 04/13/2015 > admit [facility]    . H/O hiatal  hernia   . Hyperlipidemia LDL goal <70 08/22/2018  . Hypertension 08/22/2018  . Hypothyroidism 04/13/2015  . Myasthenia gravis ([place]) 04/13/2015  . Obesity 04/13/2015  . Paroxysmal A-fib ([place]) 04/16/2015  . S/P angioplasty with stent 08/21/18 DES to RCA 08/22/2018  . Sepsis ([place]) 04/15/2015    Past Surgical History:  Procedure Laterality Date  . AMPUTATION  06/11/2012   Procedure: AMPUTATION DIGIT;  Surgeon: [name], MD;  Location: [facility];  Service: Orthopedics;  Laterality: Left;  revision of amputation  . CORONARY STENT INTERVENTION N/A 08/21/2018   Procedure: CORONARY STENT INTERVENTION;  Surgeon: [name], MD;  Location: [facility];  Service: Cardiovascular;  Laterality: N/A;  . HERNIA REPAIR  1994  . LEFT HEART CATH AND CORONARY ANGIOGRAPHY N/A 08/21/2018   Procedure: LEFT HEART CATH AND CORONARY ANGIOGRAPHY;  Surgeon: [name], MD;  Location: [facility];  Service: Cardiovascular;  Laterality: N/A;  . ORIF SHOULDER FRACTURE  06/13/2012   Procedure: OPEN REDUCTION INTERNAL FIXATION (ORIF) SHOULDER FRACTURE;  Surgeon: [name], MD;  Location: [facility];  Service: Orthopedics;  Laterality: Left;  LEFT SHOULDER OPEN GREATER TUBEROSITY ORIF  . SHOULDER CLOSED REDUCTION  06/11/2012   Procedure: CLOSED MANIPULATION SHOULDER;  Surgeon: [name], MD;  Location: [facility];  Service: Orthopedics;  Laterality: Left;     Current Outpatient Medications  Medication Sig Dispense Refill  . aspirin EC 81 MG EC tablet Take 1 tablet (81 mg total) by mouth daily.    . busPIRone (BUSPAR) 5 MG tablet Take 5 mg by mouth 2 (two) times daily.  0  . clopidogrel (PLAVIX) 75 MG tablet TAKE 4 TABLETS BY MOUTH TOMORROW(STARTING DOSE), THEN FURTHERMORE TAKE 1 TABLET BY MOUTH DAILY 30 tablet 11  . furosemide (LASIX) 20 MG tablet TAKE 1 TABLET BY MOUTH EVERY DAY 90 tablet 3  . levothyroxine (SYNTHROID, LEVOTHROID) 100 MCG tablet Take 100 mcg by mouth daily.   3  . losartan  (COZAAR) 50 MG tablet Take 1 tablet (50 mg total) by mouth daily. 90 tablet 0  . nitroGLYCERIN (NITROSTAT) 0.4 MG SL tablet Place 1 tablet (0.4 mg total) under the tongue every 5 (five) minutes x 3 doses as needed for chest pain. 25 tablet 4  . pyridostigmine (MESTINON) 60 MG tablet Take 1 tablet (60 mg total) by mouth daily. 90 tablet 3   No current facility-administered medications for this visit.    Allergies:   Beta adrenergic blockers and Statins    Social History:  The patient  reports that he quit smoking about 37 years ago. His smoking use included cigarettes. He has a 30.00 pack-year smoking history. He has never used smokeless tobacco. He reports current alcohol use of about 2.0 standard drinks of alcohol per week. He reports that he does not use drugs.   Family History:  The patient's family history includes Healthy in his daughter, sister, and son; Heart attack in his father; Other in his mother.    ROS:  Please see the history of present illness.   Otherwise, review of systems are positive for none.   All other systems are reviewed and negative.    PHYSICAL EXAM: VS:  BP 119/79   Pulse 69   Temp 97.9 F (36.6 C)   Ht 5' 7"  (1.702 m)   Wt 162 lb 3.2 oz (73.6 kg)   SpO2 99%   BMI 25.40 kg/m  , BMI Body mass index is 25.4 kg/m. GEN: Well nourished, well developed, in no acute distress  HEENT: normal  Neck: no JVD, carotid bruits, or masses Cardiac: RRR; no murmurs, rubs, or gallops,no edema  Respiratory:  clear to auscultation bilaterally, normal work of breathing GI: soft, nontender, nondistended, + BS MS: no deformity or atrophy. Pedal pulses are excellent. Skin: warm and dry, no rash Neuro:  Strength and sensation are intact Psych: euthymic mood, full affect   EKG:  EKG is not  ordered today.    Lab Results  Component Value Date   WBC 7.2 09/01/2018   HGB 14.2 09/01/2018   HCT 42.0 09/01/2018   PLT 127.0 (L) 09/01/2018   GLUCOSE 93 09/01/2018   CHOL  128 06/05/2019   TRIG 109 06/05/2019   HDL 41 06/05/2019   LDLCALC 67 06/05/2019   ALT 9 06/05/2019   AST 16 06/05/2019   NA 135 09/01/2018   K 4.6 09/01/2018   CL 101 09/01/2018   CREATININE 1.24 09/01/2018   BUN 25 (H) 09/01/2018   CO2 28 09/01/2018   TSH 1.28 10/03/2016      Wt Readings from Last 3 Encounters:  11/30/19 162 lb 3.2 oz (73.6 kg)  06/11/19 160 lb (72.6 kg)  12/03/18 165 lb 5.5 oz (75 kg)      Other studies Reviewed: Additional studies/ records that were  reviewed today include:   Echo 10/22/16: Study Conclusions  - Left ventricle: The cavity size was normal. There was moderate   concentric hypertrophy. Systolic function was normal. The   estimated ejection fraction was in the range of 55% to 60%. Wall   motion was normal; there were no regional wall motion   abnormalities. Features are consistent with a pseudonormal left   ventricular filling pattern, with concomitant abnormal relaxation   and increased filling pressure (grade 2 diastolic dysfunction). - Aortic valve: There was mild regurgitation. Valve area (VTI):   2.17 cm^2. - Mitral valve: Calcified annulus. There was mild regurgitation. - Left atrium: The atrium was moderately dilated. - Pulmonary arteries: PA peak pressure: 32 mm Hg (S).   Echo 08/21/18: Study Conclusions  - Left ventricle: The cavity size was normal. Wall thickness was   increased in a pattern of mild LVH. Mid to apical inferior   hypokinesis. Hypokinesis of the apex. The estimated ejection   fraction was 45%. Features are consistent with a pseudonormal   left ventricular filling pattern, with concomitant abnormal   relaxation and increased filling pressure (grade 2 diastolic   dysfunction). - Aortic valve: Trileaflet; moderately calcified leaflets. There   was mild stenosis. There was moderate eccentric regurgitation.   Mean gradient (S): 10 mm Hg. Valve area (VTI): 1.73 cm^2. - Mitral valve: Moderately calcified  annulus. Mildly calcified   leaflets. There is some degree of restriction of the posterior   leaflet. There was moderate to severe regurgitation. - Left atrium: The atrium was moderately dilated. - Right ventricle: The cavity size was normal. Systolic function   was normal. - Right atrium: The atrium was moderately to severely dilated. - Tricuspid valve: There was moderate regurgitation. Peak RV-RA   gradient 39 mmHg. - Pulmonary arteries: PA peak pressure: 47 mm Hg (S). - Systemic veins: IVC measured 2.5 cm with > 50% respirophasic   variation, suggesting RA pressure 8 mmHg.  Impressions:  - Normal LV size with mild LV hypertrophy. EF 45% with mid to   apical inferior hypokinesis and hypokinesis of the apex. Moderate   diastolic dysfunction. Normal RV size and systolic function.   Biatrial enlargement. Moderate to severe mitral regurgitation   with restriction of the posterior leaflet. Mild aortic stenosis   with moderate eccentric regurgitation. Mild pulmonary   hypertension.   Cardiac cath/PCI: 08/21/18: CORONARY STENT INTERVENTION  LEFT HEART CATH AND CORONARY ANGIOGRAPHY  Conclusion     Prox RCA lesion is 95% stenosed.  Ost Cx lesion is 60% stenosed.  Prox Cx lesion is 60% stenosed.  Prox LAD lesion is 100% stenosed.  A drug-eluting stent was successfully placed.  Post intervention, there is a 0% residual stenosis.  There is moderate left ventricular systolic dysfunction.  LV end diastolic pressure is mildly elevated.  The left ventricular ejection fraction is 35-45% by visual estimate.   Marcus MATEJ SAPPENFIELD Sr. is a 86 y.o. male    [id number] LOCATION:  FACILITY: [facility]  PHYSICIAN: Quay Burow, M.D. Mar 27, 1932   DATE OF PROCEDURE:  08/21/2018  DATE OF DISCHARGE:     CARDIAC CATHETERIZATION / PCI DES RCA    History obtained from chart review.Marcus [name] is an 65M with paroxysmal atrial fibrillation,myastheniagravis, hyperlipidemia  and thrombocytopenia here with NSTEMI.    PROCEDURE DESCRIPTION:   The patient was brought to the second floor [facility] cath lab in the postabsorptive state. He was premedicated with IV Versed and fentanyl. His right wristwas prepped and  shaved in usual sterile fashion. Xylocaine 1% was used for local anesthesia. A 6 French sheath was inserted into the right radial artery using standard Seldinger technique. The patient received 4000units  of heparin intravenously.  A 5 [place] TIG catheter and pigtail catheters were used for selective coronary angiography and left ventriculography respectively.  Isovue dye was used for the entirety of the case.  Retrograde aorta, left ventricular and pullback pressures were recorded.  Radial cocktail was administered via the SideArm sheath.  Patient received Brilinta 180 mg p.o. followed by Angiomax bolus and infusion with a therapeutic ACT demonstrated.  Using a 6 [place] JR4 guide catheter, 0.14 pro-water guidewire and a 2 mm x 12 mm balloon predilatation was performed to the proximal dominant RCA.  I then placed a 3 mm x 60 mm long Synergy drug-eluting stent across the diseased segment and deployed at 14 to 16 atm.  I then postdilated with a 3.25 x 12 mm long noncompliant balloon up to 16 atm (3.3 mm) resulting reduction with 95% ulcerated proximal dominant RCA plaque to 0% residual.  There was distal spasm resulting in ST segment elevation which resolved with administration of intracoronary nitroglycerin.  I then attempted to cross the mid LAD CTO using a 6 [place] XB LAD 4 cm curve guide catheter, pro-water and whisper wire and 1.5 mm balloon.  I did ultimately cross the lesion but I did not think I was intraluminal and ultimately aborted the procedure.   IMPRESSION: Successful proximal RCA PCI and drug-eluting stenting using a synergy drug-eluting stent postdilated to 3.3 mm with unsuccessful attempt at crossing mid LAD CTO.  His LV function is  moderately reduced in the 40% range.  His LAD CTO can be treated in a staged fashion electively.  The sheath was removed and a TR band was placed on the right wrist to achieve patent hemostasis.  The patient left the lab in stable condition.  Quay Burow. MD, [facility] 08/21/2018 5:20 PM     Recommend uninterrupted dual antiplatelet therapy with Aspirin 38m daily and Ticagrelor 933mtwice daily for a minimum of 12 months (ACS - Class I recommendation).     ASSESSMENT AND PLAN:  1.  CAD with s/p NSTEMI in November 2019 secondary to ruptured plaque lesion in the RCA. S/p DES. He has CTO of the mid LAD but is  asymptomatic. Given lack of symptoms or LV dysfunction in the anterior wall I do not see an indication to attempt CTO PCI of the LAD. Will continue  ASA.  Intolerant of Brilinta.   2. Hyperlipidemia. LDL now 67 at goal on dietary therapy. Given prior reaction apparently to statins will focus on dietary modification for now.   3. LV dysfunction acutely at time of MI. EF 45%. Continue ARB. Euvolemic. No beta blocker due to history of intolerance. Now off lasix.  4. HTN. well controlled.   5. Myastenia gravis in remission  6. Hypothyroidism.   7. Leg fatigue. I suspect this is more related to neuropathy. He is not interested in checking dopplers and actually pulses are good.    Current medicines are reviewed at length with the patient today.  The patient does not have concerns regarding medicines.  The following changes have been made:  no change  Follow up in one year  Signed, Teyton Pattillo JoMartiniqueMD  11/30/2019 8:48 AM    [id number] [phone] [phone]

## 2019-12-21 DIAGNOSIS — H04123 Dry eye syndrome of bilateral lacrimal glands: Secondary | ICD-10-CM | POA: Diagnosis not present

## 2019-12-21 DIAGNOSIS — H1820 Unspecified corneal edema: Secondary | ICD-10-CM | POA: Diagnosis not present

## 2019-12-23 DIAGNOSIS — H18231 Secondary corneal edema, right eye: Secondary | ICD-10-CM | POA: Diagnosis not present

## 2019-12-23 DIAGNOSIS — H16233 Neurotrophic keratoconjunctivitis, bilateral: Secondary | ICD-10-CM | POA: Diagnosis not present

## 2019-12-23 DIAGNOSIS — B0052 Herpesviral keratitis: Secondary | ICD-10-CM | POA: Diagnosis not present

## 2019-12-28 DIAGNOSIS — H16233 Neurotrophic keratoconjunctivitis, bilateral: Secondary | ICD-10-CM | POA: Diagnosis not present

## 2019-12-28 DIAGNOSIS — B0052 Herpesviral keratitis: Secondary | ICD-10-CM | POA: Diagnosis not present

## 2019-12-28 DIAGNOSIS — H18231 Secondary corneal edema, right eye: Secondary | ICD-10-CM | POA: Diagnosis not present

## 2020-01-04 DIAGNOSIS — B0052 Herpesviral keratitis: Secondary | ICD-10-CM | POA: Diagnosis not present

## 2020-01-04 DIAGNOSIS — H16233 Neurotrophic keratoconjunctivitis, bilateral: Secondary | ICD-10-CM | POA: Diagnosis not present

## 2020-01-04 DIAGNOSIS — H18231 Secondary corneal edema, right eye: Secondary | ICD-10-CM | POA: Diagnosis not present

## 2020-01-12 DIAGNOSIS — E038 Other specified hypothyroidism: Secondary | ICD-10-CM | POA: Diagnosis not present

## 2020-01-12 DIAGNOSIS — Z125 Encounter for screening for malignant neoplasm of prostate: Secondary | ICD-10-CM | POA: Diagnosis not present

## 2020-01-12 DIAGNOSIS — M859 Disorder of bone density and structure, unspecified: Secondary | ICD-10-CM | POA: Diagnosis not present

## 2020-01-12 DIAGNOSIS — E7849 Other hyperlipidemia: Secondary | ICD-10-CM | POA: Diagnosis not present

## 2020-01-13 DIAGNOSIS — D696 Thrombocytopenia, unspecified: Secondary | ICD-10-CM | POA: Diagnosis not present

## 2020-01-13 DIAGNOSIS — D7589 Other specified diseases of blood and blood-forming organs: Secondary | ICD-10-CM | POA: Diagnosis not present

## 2020-01-19 DIAGNOSIS — I2510 Atherosclerotic heart disease of native coronary artery without angina pectoris: Secondary | ICD-10-CM | POA: Diagnosis not present

## 2020-01-19 DIAGNOSIS — J439 Emphysema, unspecified: Secondary | ICD-10-CM | POA: Diagnosis not present

## 2020-01-19 DIAGNOSIS — Z0000 Encounter for general adult medical examination without abnormal findings: Secondary | ICD-10-CM | POA: Diagnosis not present

## 2020-01-19 DIAGNOSIS — M859 Disorder of bone density and structure, unspecified: Secondary | ICD-10-CM | POA: Diagnosis not present

## 2020-01-19 DIAGNOSIS — R82998 Other abnormal findings in urine: Secondary | ICD-10-CM | POA: Diagnosis not present

## 2020-01-19 DIAGNOSIS — E785 Hyperlipidemia, unspecified: Secondary | ICD-10-CM | POA: Diagnosis not present

## 2020-01-19 DIAGNOSIS — I10 Essential (primary) hypertension: Secondary | ICD-10-CM | POA: Diagnosis not present

## 2020-01-19 DIAGNOSIS — B351 Tinea unguium: Secondary | ICD-10-CM | POA: Diagnosis not present

## 2020-01-19 DIAGNOSIS — I5189 Other ill-defined heart diseases: Secondary | ICD-10-CM | POA: Diagnosis not present

## 2020-01-19 DIAGNOSIS — I700 Atherosclerosis of aorta: Secondary | ICD-10-CM | POA: Diagnosis not present

## 2020-01-19 DIAGNOSIS — D6489 Other specified anemias: Secondary | ICD-10-CM | POA: Diagnosis not present

## 2020-01-19 DIAGNOSIS — D696 Thrombocytopenia, unspecified: Secondary | ICD-10-CM | POA: Diagnosis not present

## 2020-01-19 DIAGNOSIS — D649 Anemia, unspecified: Secondary | ICD-10-CM | POA: Insufficient documentation

## 2020-01-19 DIAGNOSIS — Z1389 Encounter for screening for other disorder: Secondary | ICD-10-CM | POA: Diagnosis not present

## 2020-01-26 DIAGNOSIS — Z961 Presence of intraocular lens: Secondary | ICD-10-CM | POA: Diagnosis not present

## 2020-01-26 DIAGNOSIS — H18231 Secondary corneal edema, right eye: Secondary | ICD-10-CM | POA: Diagnosis not present

## 2020-01-26 DIAGNOSIS — H1820 Unspecified corneal edema: Secondary | ICD-10-CM | POA: Diagnosis not present

## 2020-01-27 ENCOUNTER — Encounter: Payer: Self-pay | Admitting: Podiatry

## 2020-01-27 ENCOUNTER — Ambulatory Visit (INDEPENDENT_AMBULATORY_CARE_PROVIDER_SITE_OTHER): Payer: Medicare Other | Admitting: Podiatry

## 2020-01-27 ENCOUNTER — Other Ambulatory Visit: Payer: Self-pay

## 2020-01-27 VITALS — Temp 98.3°F

## 2020-01-27 DIAGNOSIS — M79675 Pain in left toe(s): Secondary | ICD-10-CM

## 2020-01-27 DIAGNOSIS — M2012 Hallux valgus (acquired), left foot: Secondary | ICD-10-CM | POA: Diagnosis not present

## 2020-01-27 DIAGNOSIS — M79674 Pain in right toe(s): Secondary | ICD-10-CM | POA: Diagnosis not present

## 2020-01-27 DIAGNOSIS — M2011 Hallux valgus (acquired), right foot: Secondary | ICD-10-CM | POA: Diagnosis not present

## 2020-01-27 DIAGNOSIS — B351 Tinea unguium: Secondary | ICD-10-CM | POA: Diagnosis not present

## 2020-01-27 MED ORDER — NONFORMULARY OR COMPOUNDED ITEM: 3 refills | Status: DC

## 2020-01-27 NOTE — Patient Instructions (Addendum)
[facility] [phone] Antifungal nail solution  Onychomycosis/Fungal Toenails  WHAT IS IT? An infection that lies within the keratin of your nail plate that is caused by a fungus.  WHY ME? Fungal infections affect all ages, sexes, races, and creeds.  There may be many factors that predispose you to a fungal infection such as age, coexisting medical conditions such as diabetes, or an autoimmune disease; stress, medications, fatigue, genetics, etc.  Bottom line: fungus thrives in a warm, moist environment and your shoes offer such a location.  IS IT CONTAGIOUS? Theoretically, yes.  You do not want to share shoes, nail clippers or files with someone who has fungal toenails.  Walking around barefoot in the same room or sleeping in the same bed is unlikely to transfer the organism.  It is important to realize, however, that fungus can spread easily from one nail to the next on the same foot.  HOW DO WE TREAT THIS?  There are several ways to treat this condition.  Treatment may depend on many factors such as age, medications, pregnancy, liver and kidney conditions, etc.  It is best to ask your doctor which options are available to you.  1. No treatment.   Unlike many other medical concerns, you can live with this condition.  However for many people this can be a painful condition and may lead to ingrown toenails or a bacterial infection.  It is recommended that you keep the nails cut short to help reduce the amount of fungal nail. 2. Topical treatment.  These range from herbal remedies to prescription strength nail lacquers.  About 40-50% effective, topicals require twice daily application for approximately 9 to 12 months or until an entirely new nail has grown out.  The most effective topicals are medical grade medications available through physicians offices. 3. Oral antifungal medications.  With an 80-90% cure rate, the most common oral medication requires 3 to 4 months of therapy and stays  in your system for a year as the new nail grows out.  Oral antifungal medications do require blood work to make sure it is a safe drug for you.  A liver function panel will be performed prior to starting the medication and after the first month of treatment.  It is important to have the blood work performed to avoid any harmful side effects.  In general, this medication safe but blood work is required. 4. Laser Therapy.  This treatment is performed by applying a specialized laser to the affected nail plate.  This therapy is noninvasive, fast, and non-painful.  It is not covered by insurance and is therefore, out of pocket.  The results have been very good with a 80-95% cure rate.  The [facility] is the only practice in the area to offer this therapy. 5. Permanent Nail Avulsion.  Removing the entire nail so that a new nail will not grow back. 

## 2020-02-01 NOTE — Progress Notes (Signed)
Subjective: Marcus [name] Sr. presents today referred by Marcus Bowen, MD for complaint of painful mycotic nails b/l that are difficult to trim. Pain interferes with ambulation. Aggravating factors include wearing enclosed shoe gear. He relates no attempt at treatment.   Past Medical History:  Diagnosis Date  . Acute on chronic combined systolic and diastolic CHF (congestive heart failure) ([name]) 08/22/2018  . Acute on chronic respiratory failure ([name]) 04/15/2015  . CAD in native artery 08/22/2018  . CAP (community acquired pneumonia) 04/13/2015   See cxr 04/13/2015 > admit [facility]    . H/O hiatal hernia   . Hyperlipidemia LDL goal <70 08/22/2018  . Hypertension 08/22/2018  . Hypothyroidism 04/13/2015  . Myasthenia gravis ([place]) 04/13/2015  . Obesity 04/13/2015  . Paroxysmal A-fib ([place]) 04/16/2015  . S/P angioplasty with stent 08/21/18 DES to RCA 08/22/2018  . Sepsis ([name]) 04/15/2015     Patient Active Problem List   Diagnosis Date Noted  . Chronic systolic CHF (congestive heart failure) ([facility] [name]) 09/29/2018  . Acute on chronic combined systolic and diastolic CHF (congestive heart failure) ([facility] [name]) 08/22/2018  . Hypertension 08/22/2018  . Hypercholesterolemia 08/22/2018  . CAD in native artery, residual disease in LCX and LAD - 08/22/2018  . S/P angioplasty with stent 08/21/18 DES to RCA 08/22/2018  . Chronic a-fib ([facility] [name]) 08/20/2018  . Chest pain, atypical 08/20/2018  . Chest pain 08/20/2018  . Non-ST elevation (NSTEMI) myocardial infarction (Marcus [name])   . Nonrheumatic mitral valve regurgitation   . Acute on chronic heart failure (Marcus [name])   . Thrombocytopenia ([name]) 10/07/2016  . Dyspnea on exertion 10/03/2016  . B12 deficiency 06/06/2016  . Acute asthmatic bronchitis 12/15/2015  . PAF (paroxysmal atrial fibrillation) ([facility] [name]) 04/16/2015  . Influenza A 04/15/2015  . Sepsis ([facility] [name]) 04/15/2015  . Acute renal failure (Marcus [name]) 04/15/2015  . CAP (community acquired pneumonia) 04/13/2015  . Obesity 04/13/2015   . Myasthenia gravis (Marcus [name]) 04/13/2015  . Hypothyroidism 04/13/2015     Past Surgical History:  Procedure Laterality Date  . AMPUTATION  06/11/2012   Procedure: AMPUTATION DIGIT;  Surgeon: [name], MD;  Location: [name];  Service: Orthopedics;  Laterality: Left;  revision of amputation  . CORONARY STENT INTERVENTION N/A 08/21/2018   Procedure: CORONARY STENT INTERVENTION;  Surgeon: [name], MD;  Location: [name];  Service: Cardiovascular;  Laterality: N/A;  . HERNIA REPAIR  1994  . LEFT HEART CATH AND CORONARY ANGIOGRAPHY N/A 08/21/2018   Procedure: LEFT HEART CATH AND CORONARY ANGIOGRAPHY;  Surgeon: [name], MD;  Location: [facility];  Service: Cardiovascular;  Laterality: N/A;  . ORIF SHOULDER FRACTURE  06/13/2012   Procedure: OPEN REDUCTION INTERNAL FIXATION (ORIF) SHOULDER FRACTURE;  Surgeon: [name], MD;  Location: [name];  Service: Orthopedics;  Laterality: Left;  LEFT SHOULDER OPEN GREATER TUBEROSITY ORIF  . SHOULDER CLOSED REDUCTION  06/11/2012   Procedure: CLOSED MANIPULATION SHOULDER;  Surgeon: [name], MD;  Location: [name];  Service: Orthopedics;  Laterality: Left;     Current Outpatient Medications on File Prior to Visit  Medication Sig Dispense Refill  . aspirin EC 81 MG EC tablet Take 1 tablet (81 mg total) by mouth daily.    . busPIRone (BUSPAR) 5 MG tablet Take 5 mg by mouth 2 (two) times daily.  0  . levothyroxine (SYNTHROID) 112 MCG tablet Take 112 mcg by mouth daily.    Marland Kitchen losartan (COZAAR) 50 MG tablet Take 1 tablet (50 mg total) by mouth  daily. 90 tablet 0  . BESIVANCE 0.6 % SUSP Place 1 drop into the right eye daily.    Marland Kitchen ivermectin (STROMECTOL) 3 MG TABS tablet SMARTSIG:5 Tablet(s) By Mouth Once a Week    . MAXITROL 3.5-10000-0.1 OINT 1 Small Amount Right Eye every evening    . nitroGLYCERIN (NITROSTAT) 0.4 MG SL tablet Place 1 tablet (0.4 mg total) under the tongue every 5 (five) minutes x 3 doses as needed  for chest pain. (Patient not taking: Reported on 01/27/2020) 25 tablet 4  . prednisoLONE acetate (PRED FORTE) 1 % ophthalmic suspension     . valACYclovir (VALTREX) 500 MG tablet Take 500 mg by mouth 2 (two) times daily.     No current facility-administered medications on file prior to visit.     Allergies  Allergen Reactions  . Beta Adrenergic Blockers Hives  . Statins Hives     Social History   Occupational History  . Not on file  Tobacco Use  . Smoking status: Former Smoker    Packs/day: 1.00    Years: 30.00    Pack years: 30.00    Types: Cigarettes    Quit date: 01/10/1982    Years since quitting: 38.0  . Smokeless tobacco: Never Used  Substance and Sexual Activity  . Alcohol use: Yes    Alcohol/week: 2.0 standard drinks    Types: 2 Glasses of wine per week    Comment: 3-4 per week   . Drug use: No  . Sexual activity: Never     Family History  Problem Relation Age of Onset  . Other Mother        Deceased, 54  . Heart attack Father        Deceased, 8  . Healthy Sister   . Healthy Daughter        x 4  . Healthy Son        x 3     Immunization History  Administered Date(s) Administered  . PFIZER SARS-COV-2 Vaccination 10/23/2019, 11/13/2019  . Pneumococcal Conjugate-13 04/21/2014  . Pneumococcal Polysaccharide-23 07/08/2016  . Zoster 07/03/2015     Objective: Vitals:   01/27/20 1318  Temp: 98.3 F (36.8 C)    Pt is a pleasant 86 y.o. Caucasian male, WD, WN  in NAD.  AAO x 3.   Vascular Examination:  Capillary refill time to digits immediate b/l. Palpable DP pulses b/l. Palpable PT pulses b/l. Pedal hair sparse b/l. Skin temperature gradient within normal limits b/l. No edema noted b/l. No pain with calf compression b/l.  Dermatological Examination: Pedal skin with normal turgor, texture and tone bilaterally. No open wounds bilaterally. No interdigital macerations bilaterally. Toenails 1-5 b/l elongated, dystrophic, thickened, crumbly with subungual  debris and tenderness to dorsal palpation.  Musculoskeletal: Normal muscle strength 5/5 to all lower extremity muscle groups bilaterally. No pain crepitus or joint limitation noted with ROM b/l. Hallux valgus with bunion deformity noted b/l.  Neurological: Protective sensation intact 5/5 intact bilaterally with 10g monofilament b/l. Vibratory sensation intact b/l. Proprioception intact bilaterally. Babinski reflex negative b/l. Clonus negative b/l.  Assessment: 1. Pain due to onychomycosis of toenails of both feet   2. Hallux valgus, acquired, bilateral     Plan: -Discussed topical, laser and oral medication. Patient opted for topical treatment with compounded medication. Rx written for nonformulary compounding topical antifungal: [facility]: Antifungal cream - Terbinafine 3%, Fluconazole 2%, Tea Tree Oil 5%, Urea 10%, Ibuprofen 2% in DMSO Suspension #33ml. Apply to the affected nail(s)  at bedtime. -Toenails 1-5 b/l were debrided in length and girth with sterile nail nippers and dremel without iatrogenic bleeding.  -Patient to continue soft, supportive shoe gear daily. -Patient to report any pedal injuries to medical professional immediately. -Patient/POA to call should there be question/concern in the interim.  Return in about 3 months (around 04/27/2020) for nail trim.

## 2020-03-01 ENCOUNTER — Emergency Department (HOSPITAL_COMMUNITY): Payer: Medicare Other

## 2020-03-01 ENCOUNTER — Encounter (HOSPITAL_COMMUNITY): Payer: Self-pay

## 2020-03-01 ENCOUNTER — Other Ambulatory Visit: Payer: Self-pay

## 2020-03-01 ENCOUNTER — Emergency Department (HOSPITAL_COMMUNITY)
Admission: EM | Admit: 2020-03-01 | Discharge: 2020-03-02 | Disposition: A | Discharge status: Home / Self Care | Payer: Medicare Other | Attending: Emergency Medicine | Admitting: Emergency Medicine

## 2020-03-01 DIAGNOSIS — I110 Hypertensive heart disease with heart failure: Secondary | ICD-10-CM | POA: Insufficient documentation

## 2020-03-01 DIAGNOSIS — Z7982 Long term (current) use of aspirin: Secondary | ICD-10-CM | POA: Diagnosis not present

## 2020-03-01 DIAGNOSIS — I252 Old myocardial infarction: Secondary | ICD-10-CM | POA: Insufficient documentation

## 2020-03-01 DIAGNOSIS — I483 Typical atrial flutter: Secondary | ICD-10-CM | POA: Insufficient documentation

## 2020-03-01 DIAGNOSIS — E039 Hypothyroidism, unspecified: Secondary | ICD-10-CM | POA: Insufficient documentation

## 2020-03-01 DIAGNOSIS — E875 Hyperkalemia: Secondary | ICD-10-CM | POA: Diagnosis not present

## 2020-03-01 DIAGNOSIS — Z87891 Personal history of nicotine dependence: Secondary | ICD-10-CM | POA: Diagnosis not present

## 2020-03-01 DIAGNOSIS — Z79899 Other long term (current) drug therapy: Secondary | ICD-10-CM | POA: Diagnosis not present

## 2020-03-01 DIAGNOSIS — I5042 Chronic combined systolic (congestive) and diastolic (congestive) heart failure: Secondary | ICD-10-CM | POA: Insufficient documentation

## 2020-03-01 DIAGNOSIS — G7000 Myasthenia gravis without (acute) exacerbation: Secondary | ICD-10-CM | POA: Insufficient documentation

## 2020-03-01 DIAGNOSIS — R600 Localized edema: Secondary | ICD-10-CM | POA: Diagnosis not present

## 2020-03-01 DIAGNOSIS — R079 Chest pain, unspecified: Secondary | ICD-10-CM | POA: Diagnosis not present

## 2020-03-01 DIAGNOSIS — R0602 Shortness of breath: Secondary | ICD-10-CM | POA: Diagnosis present

## 2020-03-01 DIAGNOSIS — Z7901 Long term (current) use of anticoagulants: Secondary | ICD-10-CM | POA: Insufficient documentation

## 2020-03-01 DIAGNOSIS — I10 Essential (primary) hypertension: Secondary | ICD-10-CM

## 2020-03-01 LAB — CBC
HCT: 34.6 % — ABNORMAL LOW (ref 39.0–52.0)
Hemoglobin: 11.1 g/dL — ABNORMAL LOW (ref 13.0–17.0)
MCH: 33.9 pg (ref 26.0–34.0)
MCHC: 32.1 g/dL (ref 30.0–36.0)
MCV: 105.8 fL — ABNORMAL HIGH (ref 80.0–100.0)
Platelets: 120 10*3/uL — ABNORMAL LOW (ref 150–400)
RBC: 3.27 MIL/uL — ABNORMAL LOW (ref 4.22–5.81)
RDW: 15.5 % (ref 11.5–15.5)
WBC: 6.2 10*3/uL (ref 4.0–10.5)
nRBC: 0 % (ref 0.0–0.2)

## 2020-03-01 LAB — BASIC METABOLIC PANEL
Anion gap: 8 (ref 5–15)
BUN: 28 mg/dL — ABNORMAL HIGH (ref 8–23)
CO2: 23 mmol/L (ref 22–32)
Calcium: 8.8 mg/dL — ABNORMAL LOW (ref 8.9–10.3)
Chloride: 108 mmol/L (ref 98–111)
Creatinine, Ser: 1.2 mg/dL (ref 0.61–1.24)
GFR calc Af Amer: >60 mL/min (ref >60)
GFR calc non Af Amer: 55 mL/min — ABNORMAL LOW (ref >60)
Glucose, Bld: 92 mg/dL (ref 70–99)
Potassium: 5.8 mmol/L — ABNORMAL HIGH (ref 3.5–5.1)
Sodium: 139 mmol/L (ref 135–145)

## 2020-03-01 LAB — TROPONIN I (HIGH SENSITIVITY)
Troponin I (High Sensitivity): 15 ng/L (ref <18)
Troponin I (High Sensitivity): 18 ng/L — ABNORMAL HIGH (ref <18)

## 2020-03-01 LAB — MAGNESIUM: Magnesium: 2.3 mg/dL (ref 1.7–2.4)

## 2020-03-01 LAB — PHOSPHORUS: Phosphorus: 3.4 mg/dL (ref 2.5–4.6)

## 2020-03-01 LAB — POTASSIUM: Potassium: 5.8 mmol/L — ABNORMAL HIGH (ref 3.5–5.1)

## 2020-03-01 MED ORDER — ISOSORBIDE MONONITRATE ER 30 MG PO TB24: 60.0000 mg | ORAL_TABLET | Freq: Every day | ORAL | Status: DC

## 2020-03-01 MED ORDER — SODIUM ZIRCONIUM CYCLOSILICATE 10 G PO PACK
10.0000 g | PACK | Freq: Once | ORAL | Status: AC
  Administered 2020-03-01: 10 g via ORAL
  Filled 2020-03-01: qty 1

## 2020-03-01 MED ORDER — SODIUM CHLORIDE 0.9% FLUSH
3.0000 mL | Freq: Once | INTRAVENOUS | Status: AC
  Administered 2020-03-01: 3 mL via INTRAVENOUS

## 2020-03-01 NOTE — ED Provider Notes (Addendum)
[facility] EMERGENCY DEPARTMENT Provider Note   CSN: [id number] Arrival date & time: 03/01/20  1556     History Chief Complaint  Patient presents with  . CP/SOB    Marcus Kaufmann Sr. is a 86 y.o. male.  The history is provided by the patient and medical records. No language interpreter was used.   Marcus A Rusnak Sr. is a 86 y.o. male who presents to the Emergency Department complaining of shortness of breath. He presents the emergency department for evaluation of shortness of breath and discomfort in his left upper extremity. Symptoms happened when he walked to his mailbox earlier today. He felt as if he might pass out. He called his cardiologist, who recommended he presents to the emergency department for further evaluation. Since that episode he has been completely asymptomatic. He denies any headache, chest pain, abdominal pain, nausea, vomiting, diaphoresis, leg swelling or pain. He has been compliant with his medications and has had no recent changes. Symptoms were severe, but now resolved. He lives alone. No tobacco, alcohol, drug use.     Past Medical History:  Diagnosis Date  . Acute on chronic combined systolic and diastolic CHF (congestive heart failure) ([place]) 08/22/2018  . Acute on chronic respiratory failure ([place]) 04/15/2015  . CAD in native artery 08/22/2018  . CAP (community acquired pneumonia) 04/13/2015   See cxr 04/13/2015 > admit [facility]    . H/O hiatal hernia   . Hyperlipidemia LDL goal <70 08/22/2018  . Hypertension 08/22/2018  . Hypothyroidism 04/13/2015  . Myasthenia gravis ([place]) 04/13/2015  . Obesity 04/13/2015  . Paroxysmal A-fib ([place]) 04/16/2015  . S/P angioplasty with stent 08/21/18 DES to RCA 08/22/2018  . Sepsis ([place]) 04/15/2015    Patient Active Problem List   Diagnosis Date Noted  . Chronic systolic CHF (congestive heart failure) ([place]) 09/29/2018  . Acute on chronic combined systolic and diastolic CHF (congestive heart failure) ([place])  08/22/2018  . Hypertension 08/22/2018  . Hypercholesterolemia 08/22/2018  . CAD in native artery, residual disease in LCX and LAD - 08/22/2018  . S/P angioplasty with stent 08/21/18 DES to RCA 08/22/2018  . Chronic a-fib ([place]) 08/20/2018  . Chest pain, atypical 08/20/2018  . Chest pain 08/20/2018  . Non-ST elevation (NSTEMI) myocardial infarction ([place])   . Nonrheumatic mitral valve regurgitation   . Acute on chronic heart failure ([place])   . Thrombocytopenia ([place]) 10/07/2016  . Dyspnea on exertion 10/03/2016  . B12 deficiency 06/06/2016  . Acute asthmatic bronchitis 12/15/2015  . PAF (paroxysmal atrial fibrillation) ([place]) 04/16/2015  . Influenza A 04/15/2015  . Sepsis ([place]) 04/15/2015  . Acute renal failure ([place]) 04/15/2015  . CAP (community acquired pneumonia) 04/13/2015  . Obesity 04/13/2015  . Myasthenia gravis ([place]) 04/13/2015  . Hypothyroidism 04/13/2015    Past Surgical History:  Procedure Laterality Date  . AMPUTATION  06/11/2012   Procedure: AMPUTATION DIGIT;  Surgeon: [name], MD;  Location: [facility];  Service: Orthopedics;  Laterality: Left;  revision of amputation  . CORONARY STENT INTERVENTION N/A 08/21/2018   Procedure: CORONARY STENT INTERVENTION;  Surgeon: [name], MD;  Location: [facility];  Service: Cardiovascular;  Laterality: N/A;  . HERNIA REPAIR  1994  . LEFT HEART CATH AND CORONARY ANGIOGRAPHY N/A 08/21/2018   Procedure: LEFT HEART CATH AND CORONARY ANGIOGRAPHY;  Surgeon: [name], MD;  Location: [facility];  Service: Cardiovascular;  Laterality: N/A;  . ORIF SHOULDER FRACTURE  06/13/2012   Procedure:  OPEN REDUCTION INTERNAL FIXATION (ORIF) SHOULDER FRACTURE;  Surgeon: [name], MD;  Location: [facility];  Service: Orthopedics;  Laterality: Left;  LEFT SHOULDER OPEN GREATER TUBEROSITY ORIF  . SHOULDER CLOSED REDUCTION  06/11/2012   Procedure: CLOSED MANIPULATION SHOULDER;  Surgeon: [name], MD;  Location: [facility];   Service: Orthopedics;  Laterality: Left;       Family History  Problem Relation Age of Onset  . Other Mother        Deceased, 36  . Heart attack Father        Deceased, 59  . Healthy Sister   . Healthy Daughter        x 4  . Healthy Son        x 3    Social History   Tobacco Use  . Smoking status: Former Smoker    Packs/day: 1.00    Years: 30.00    Pack years: 30.00    Types: Cigarettes    Quit date: 01/10/1982    Years since quitting: 38.1  . Smokeless tobacco: Never Used  Substance Use Topics  . Alcohol use: Yes    Alcohol/week: 2.0 standard drinks    Types: 2 Glasses of wine per week    Comment: 3-4 per week   . Drug use: No    Home Medications Prior to Admission medications   Medication Sig Start Date End Date Taking? Authorizing Provider  aspirin EC 81 MG EC tablet Take 1 tablet (81 mg total) by mouth daily. 08/22/18  Yes Isaiah Serge, NP  BESIVANCE 0.6 % SUSP Place 1 drop into the right eye daily. 01/11/20  Yes [provider]  levothyroxine (SYNTHROID) 100 MCG tablet Take 100 mcg by mouth daily before breakfast.   Yes [provider]  losartan (COZAAR) 50 MG tablet Take 1 tablet (50 mg total) by mouth daily. 11/12/19  Yes [place], [name], MD  MAXITROL 3.5-10000-0.1 [place] 1 application into the right eye at bedtime.  01/11/20  Yes [provider]  nitroGLYCERIN (NITROSTAT) 0.4 MG SL tablet Place 1 tablet (0.4 mg total) under the tongue every 5 (five) minutes x 3 doses as needed for chest pain. 08/22/18  Yes Isaiah Serge, NP  amLODipine (NORVASC) 5 MG tablet Take 1 tablet (5 mg total) by mouth daily. 03/02/20   Quintella Reichert, MD  isosorbide mononitrate (IMDUR) 60 MG 24 hr tablet Take 1 tablet (60 mg total) by mouth daily. 03/02/20   Quintella Reichert, MD  NONFORMULARY OR COMPOUNDED ITEM Antifungal solution: Terbinafine 3%, Fluconazole 2%, Tea Tree Oil 5%, Urea 10%, Ibuprofen 2% in DMSO suspension #22mL Patient not taking: Reported on  03/01/2020 01/27/20   Marzetta Board, DPM  Rivaroxaban (XARELTO) 15 MG TABS tablet Take 1 tablet (15 mg total) by mouth daily with supper. 03/02/20   Quintella Reichert, MD    Allergies    Beta adrenergic blockers and Statins  Review of Systems   Review of Systems  All other systems reviewed and are negative.   Physical Exam Updated Vital Signs BP (!) 167/75   Pulse (!) 52   Temp 98.4 F (36.9 C) (Axillary)   Resp 17   Ht 5\' 8"  (1.727 m)   Wt 74.8 kg   SpO2 99%   BMI 25.09 kg/m   Physical Exam Vitals and nursing note reviewed.  Constitutional:      Appearance: He is well-developed.  HENT:     Head: Normocephalic and atraumatic.  Cardiovascular:  Rate and Rhythm: Normal rate and regular rhythm.     Heart sounds: No murmur.  Pulmonary:     Effort: Pulmonary effort is normal. No respiratory distress.     Breath sounds: Normal breath sounds.  Abdominal:     Palpations: Abdomen is soft.     Tenderness: There is no abdominal tenderness. There is no guarding or rebound.  Musculoskeletal:        General: No tenderness.     Comments: One plus pitting edema to bilateral lower extremities  Skin:    General: Skin is warm and dry.  Neurological:     Mental Status: He is alert and oriented to person, place, and time.  Psychiatric:        Behavior: Behavior normal.     ED Results / Procedures / Treatments   Labs (all labs ordered are listed, but only abnormal results are displayed) Labs Reviewed  BASIC METABOLIC PANEL - Abnormal; Notable for the following components:      Result Value   Potassium 5.8 (*)    BUN 28 (*)    Calcium 8.8 (*)    GFR calc non Af Amer 55 (*)    All other components within normal limits  CBC - Abnormal; Notable for the following components:   RBC 3.27 (*)    Hemoglobin 11.1 (*)    HCT 34.6 (*)    MCV 105.8 (*)    Platelets 120 (*)    All other components within normal limits  POTASSIUM - Abnormal; Notable for the following components:    Potassium 5.8 (*)    All other components within normal limits  TROPONIN I (HIGH SENSITIVITY) - Abnormal; Notable for the following components:   Troponin I (High Sensitivity) 18 (*)    All other components within normal limits  MAGNESIUM  PHOSPHORUS  TROPONIN I (HIGH SENSITIVITY)    EKG EKG Interpretation  Date/Time:  Tuesday March 01 2020 16:24:23 EDT Ventricular Rate:  62 PR Interval:    QRS Duration: 132 QT Interval:  448 QTC Calculation: 454 R Axis:   -43 Text Interpretation: Atrial flutter with 4:1 A-V conduction Left axis deviation Right bundle branch block Minimal voltage criteria for LVH, may be normal variant ( R in aVL ) Abnormal ECG Confirmed by Quintella Reichert [phone]) on 03/01/2020 8:15:27 PM   Radiology DG Chest 2 View  Result Date: 03/01/2020 CLINICAL DATA:  Chest pain EXAM: CHEST - 2 VIEW COMPARISON:  09/01/2018 FINDINGS: There is hyperinflation of the lungs compatible with COPD. Heart is normal size. Increased markings at the left base, likely scarring. Right lung clear. No effusions or acute bony abnormality. IMPRESSION: COPD/chronic changes.  No active disease. Electronically Signed   By: Rolm Baptise M.D.   On: 03/01/2020 17:26    Procedures Procedures (including critical care time)  Medications Ordered in ED Medications  sodium chloride flush (NS) 0.9 % injection 3 mL (3 mLs Intravenous Given 03/01/20 2105)  sodium zirconium cyclosilicate (LOKELMA) packet 10 g (10 g Oral Given 03/01/20 2238)    ED Course  I have reviewed the triage vital signs and the nursing notes.  Pertinent labs & imaging results that were available during my care of the patient were reviewed by me and considered in my medical decision making (see chart for details).    MDM Rules/Calculators/A&P                     patient with history of coronary artery disease here  for evaluation of episode of shortness of breath with exertion. He is asymptomatic on evaluation the emergency department.  EKG demonstrates rate control atrial flutter. Labs are significant for hyperkalemia. Discussed with patient recommendation for admission given his hyperkalemia, concerning symptoms and marked hypertension on ED evaluation. Patient refuses admission to the hospital at this time. Discussed the case with Dr. Kalman Shan, with cardiology. Will discontinue his Cozaar and start imdur 60, amlodipine five as well as Xarelto for new onset atrial flutter. Patient was given a dose of Lokelma in the emergency department. Discussed with patient that he is welcome to return anytime for repeat evaluation. Outpatient follow-up discussed.  CHA2DS2/VAS Stroke Risk Points  Current as of about an hour ago     5 >= 2 Points: High Risk  1 - 1.99 Points: Medium Risk  0 Points: Low Risk    Last Change: N/A      Details    This score determines the patient's risk of having a stroke if the  patient has atrial fibrillation.       Points Metrics  1 Has Congestive Heart Failure:  Yes    Current as of about an hour ago  1 Has Vascular Disease:  Yes    Current as of about an hour ago  1 Has Hypertension:  Yes    Current as of about an hour ago  2 Age:  26    Current as of about an hour ago  0 Has Diabetes:  No    Current as of about an hour ago  0 Had Stroke:  No  Had TIA:  No  Had thromboembolism:  No    Current as of about an hour ago  0 Male:  No    Current as of about an hour ago            Final Clinical Impression(s) / ED Diagnoses Final diagnoses:  Typical atrial flutter (HCC)  Hyperkalemia  Hypertension, unspecified type    Rx / DC Orders ED Discharge Orders         Ordered    amLODipine (NORVASC) 5 MG tablet  Daily     03/02/20 0017    isosorbide mononitrate (IMDUR) 60 MG 24 hr tablet  Daily     03/02/20 0017    Rivaroxaban (XARELTO) 15 MG TABS tablet  Daily with supper     03/02/20 0017           Quintella Reichert, MD 03/02/20 Corey Skains    Quintella Reichert, MD 03/10/20 1240

## 2020-03-01 NOTE — ED Triage Notes (Signed)
Patient complains of SOB and CP since lunch time. Complains of pain radiating down left arm. On assessment all discomfort resolved. Patient alert and oriented, NAD

## 2020-03-01 NOTE — Telephone Encounter (Signed)
Spoke with patient. Patient has been having shortness of breath for the last few days with minimal exertion. Today patient has "mild" chest pain and left arm discomfort. No dizziness or sweating. O2 saturation 98-99%. Due to active chest pain patient advised to go to the nearest emergency room for evaluation. Patient verbalized understanding.

## 2020-03-02 MED ORDER — RIVAROXABAN 15 MG PO TABS
15.0000 mg | ORAL_TABLET | Freq: Every day | ORAL | 0 refills | Status: DC
Start: 2020-03-02 — End: 2020-03-21

## 2020-03-02 MED ORDER — AMLODIPINE BESYLATE 5 MG PO TABS
5.0000 mg | ORAL_TABLET | Freq: Every day | ORAL | 0 refills | Status: DC
Start: 2020-03-02 — End: 2020-03-21

## 2020-03-02 MED ORDER — ISOSORBIDE MONONITRATE ER 60 MG PO TB24: 60.0000 mg | ORAL_TABLET | Freq: Every day | ORAL | 0 refills | Status: DC

## 2020-03-02 NOTE — ED Notes (Signed)
Discharge instructions reviewed with pt. Pt verbalized understanding.   

## 2020-03-03 NOTE — H&P (View-Only) (Signed)
Cardiology Office Note    Date:  03/04/2020   ID:  Marcus Kaufmann Sr., DOB [birth year], MRN [id number]  PCP:  [name], MD  Cardiologist:   Shefali Ng [place], MD   Chief Complaint  Patient presents with  . Atrial Flutter  . Coronary Artery Disease      History of Present Illness: Marcus A Compere Sr. is a 86 y.o. male who is seen for follow up CAD. He has a history of myasthenia gravis and remote PAF in the setting of pneumonia July 2016.  He presented 08/20/2018 with a non-ST elevation MI.  08/21/2018 he underwent diagnostic catheterization and intervention with a DES to his RCA.   Patient did have a residual total LAD that Dr [name] attempted to cross but it appeared the wire got subintimal and so the procedure was aborted. He also has a 60% circumflex.  Echocardiogram showed an EF of 40% with moderate to severe MR.  Though his LVEDP at cath was normal he was felt to be volume overloaded.  He was diuresed and sent home on a diuretic as well.    On initial follow up he complained of persistent SOB. Was seen by Dr. Melvyn Novas who felt this was not a pulmonary issue. Later switched from Brilinta to Plavix with significant improvement in symptoms. He was considered for potential CTO PCI of the LAD but since he was asymptomatic this was not recommended.   He was seen recently in the ED on 03/01/20 after experiencing an episode of dyspnea and left arm pain while walking to his mailbox. He states prior to this he was very inactive and decided to start walking. He walked 30 minutes a day for 4 days before this occurred. In the ED he was found to be in atrial flutter with 4:1 conduction and controlled rate. He was severely hypertensive. Potassium was elevated at 5.8. troponin was 15>18. CXR NAD. Admission was considered but patient refused. Losartan was stopped and he was started on amlodipine and Imdur. Also placed on Xarelto. He has not started on any of these medications.   Notes BP at home is  typically 160/80. Had normal potassium on last lab work with Dr Forde Dandy. Notes now he is very exhausted with any exertion and lightheaded. No chest or arm pain. No dyspnea.     Past Medical History:  Diagnosis Date  . Acute on chronic combined systolic and diastolic CHF (congestive heart failure) ([place]) 08/22/2018  . Acute on chronic respiratory failure ([place]) 04/15/2015  . CAD in native artery 08/22/2018  . CAP (community acquired pneumonia) 04/13/2015   See cxr 04/13/2015 > admit [facility]    . H/O hiatal hernia   . Hyperlipidemia LDL goal <70 08/22/2018  . Hypertension 08/22/2018  . Hypothyroidism 04/13/2015  . Myasthenia gravis ([place]) 04/13/2015  . Obesity 04/13/2015  . Paroxysmal A-fib ([place]) 04/16/2015  . S/P angioplasty with stent 08/21/18 DES to RCA 08/22/2018  . Sepsis ([place]) 04/15/2015    Past Surgical History:  Procedure Laterality Date  . AMPUTATION  06/11/2012   Procedure: AMPUTATION DIGIT;  Surgeon: [name], MD;  Location: [facility];  Service: Orthopedics;  Laterality: Left;  revision of amputation  . CORONARY STENT INTERVENTION N/A 08/21/2018   Procedure: CORONARY STENT INTERVENTION;  Surgeon: [name], MD;  Location: [facility];  Service: Cardiovascular;  Laterality: N/A;  . HERNIA REPAIR  1994  . LEFT HEART CATH AND CORONARY ANGIOGRAPHY N/A 08/21/2018   Procedure: LEFT HEART  CATH AND CORONARY ANGIOGRAPHY;  Surgeon: [name], MD;  Location: [facility];  Service: Cardiovascular;  Laterality: N/A;  . ORIF SHOULDER FRACTURE  06/13/2012   Procedure: OPEN REDUCTION INTERNAL FIXATION (ORIF) SHOULDER FRACTURE;  Surgeon: [name], MD;  Location: [facility];  Service: Orthopedics;  Laterality: Left;  LEFT SHOULDER OPEN GREATER TUBEROSITY ORIF  . SHOULDER CLOSED REDUCTION  06/11/2012   Procedure: CLOSED MANIPULATION SHOULDER;  Surgeon: [name], MD;  Location: [facility];  Service: Orthopedics;  Laterality: Left;     Current Outpatient Medications    Medication Sig Dispense Refill  . BESIVANCE 0.6 % SUSP Place 1 drop into the right eye daily.    Marland Kitchen levothyroxine (SYNTHROID) 100 MCG tablet Take 100 mcg by mouth daily before breakfast.    . MAXITROL 3.5-10000-0.1 OINT Place 1 application into the right eye at bedtime.     . nitroGLYCERIN (NITROSTAT) 0.4 MG SL tablet Place 1 tablet (0.4 mg total) under the tongue every 5 (five) minutes x 3 doses as needed for chest pain. 25 tablet 4  . amLODipine (NORVASC) 5 MG tablet Take 1 tablet (5 mg total) by mouth daily. (Patient not taking: Reported on 03/04/2020) 30 tablet 0  . Rivaroxaban (XARELTO) 15 MG TABS tablet Take 1 tablet (15 mg total) by mouth daily with supper. (Patient not taking: Reported on 03/04/2020) 30 tablet 0   No current facility-administered medications for this visit.    Allergies:   Beta adrenergic blockers and Statins    Social History:  The patient  reports that he quit smoking about 38 years ago. His smoking use included cigarettes. He has a 30.00 pack-year smoking history. He has never used smokeless tobacco. He reports current alcohol use of about 2.0 standard drinks of alcohol per week. He reports that he does not use drugs.   Family History:  The patient's family history includes Healthy in his daughter, sister, and son; Heart attack in his father; Other in his mother.    ROS:  Please see the history of present illness.   Otherwise, review of systems are positive for none.   All other systems are reviewed and negative.    PHYSICAL EXAM: VS:  BP (!) 198/102   Pulse (!) 56   Temp (!) 97.2 F (36.2 C)   Ht _0  (1.727 m)   Wt 164 lb 9.6 oz (74.7 kg)   BMI 25.03 kg/m  , BMI Body mass index is 25.03 kg/m. GEN: Well nourished, well developed, in no acute distress  HEENT: normal  Neck: no JVD, carotid bruits, or masses Cardiac: IRRR; gr 2/6 systolic murmur RUSB, rubs, or gallops,no edema  Respiratory:  clear to auscultation bilaterally, normal work of breathing GI:  soft, nontender, nondistended, + BS MS: no deformity or atrophy. Pedal pulses are excellent. Skin: warm and dry, no rash Neuro:  Strength and sensation are intact Psych: euthymic mood, full affect   EKG:  EKG is ordered today. ECG done 03/01/20 showed atrial flutter rate 59. LAFB, RBBB. Atrial flutter new since September 2020. Ecg today is unchanged with Aflutter rate 56. I have personally reviewed and interpreted this study.     Lab Results  Component Value Date   WBC 6.2 03/01/2020   HGB 11.1 (L) 03/01/2020   HCT 34.6 (L) 03/01/2020   PLT 120 (L) 03/01/2020   GLUCOSE 92 03/01/2020   CHOL 128 06/05/2019   TRIG 109 06/05/2019   HDL 41 06/05/2019   [place]  67 06/05/2019   ALT 9 06/05/2019   AST 16 06/05/2019   NA 139 03/01/2020   K 5.8 (H) 03/01/2020   CL 108 03/01/2020   CREATININE 1.20 03/01/2020   BUN 28 (H) 03/01/2020   CO2 23 03/01/2020   TSH 1.28 10/03/2016   Dated 01/12/20: cholesterol 135, triglycerides 93, HDL 36, LDL 80.TSH normal.    Wt Readings from Last 3 Encounters:  03/04/20 164 lb 9.6 oz (74.7 kg)  03/01/20 165 lb (74.8 kg)  11/30/19 162 lb 3.2 oz (73.6 kg)      Other studies Reviewed: Additional studies/ records that were reviewed today include:   Echo 10/22/16: Study Conclusions  - Left ventricle: The cavity size was normal. There was moderate   concentric hypertrophy. Systolic function was normal. The   estimated ejection fraction was in the range of 55% to 60%. Wall   motion was normal; there were no regional wall motion   abnormalities. Features are consistent with a pseudonormal left   ventricular filling pattern, with concomitant abnormal relaxation   and increased filling pressure (grade 2 diastolic dysfunction). - Aortic valve: There was mild regurgitation. Valve area (VTI):   2.17 cm^2. - Mitral valve: Calcified annulus. There was mild regurgitation. - Left atrium: The atrium was moderately dilated. - Pulmonary arteries: PA peak  pressure: 32 mm Hg (S).   Echo 08/21/18: Study Conclusions  - Left ventricle: The cavity size was normal. Wall thickness was   increased in a pattern of mild LVH. Mid to apical inferior   hypokinesis. Hypokinesis of the apex. The estimated ejection   fraction was 45%. Features are consistent with a pseudonormal   left ventricular filling pattern, with concomitant abnormal   relaxation and increased filling pressure (grade 2 diastolic   dysfunction). - Aortic valve: Trileaflet; moderately calcified leaflets. There   was mild stenosis. There was moderate eccentric regurgitation.   Mean gradient (S): 10 mm Hg. Valve area (VTI): 1.73 cm^2. - Mitral valve: Moderately calcified annulus. Mildly calcified   leaflets. There is some degree of restriction of the posterior   leaflet. There was moderate to severe regurgitation. - Left atrium: The atrium was moderately dilated. - Right ventricle: The cavity size was normal. Systolic function   was normal. - Right atrium: The atrium was moderately to severely dilated. - Tricuspid valve: There was moderate regurgitation. Peak RV-RA   gradient 39 mmHg. - Pulmonary arteries: PA peak pressure: 47 mm Hg (S). - Systemic veins: IVC measured 2.5 cm with > 50% respirophasic   variation, suggesting RA pressure 8 mmHg.  Impressions:  - Normal LV size with mild LV hypertrophy. EF 45% with mid to   apical inferior hypokinesis and hypokinesis of the apex. Moderate   diastolic dysfunction. Normal RV size and systolic function.   Biatrial enlargement. Moderate to severe mitral regurgitation   with restriction of the posterior leaflet. Mild aortic stenosis   with moderate eccentric regurgitation. Mild pulmonary   hypertension.   Cardiac cath/PCI: 08/21/18: CORONARY STENT INTERVENTION  LEFT HEART CATH AND CORONARY ANGIOGRAPHY  Conclusion     Prox RCA lesion is 95% stenosed.  Ost Cx lesion is 60% stenosed.  Prox Cx lesion is 60%  stenosed.  Prox LAD lesion is 100% stenosed.  A drug-eluting stent was successfully placed.  Post intervention, there is a 0% residual stenosis.  There is moderate left ventricular systolic dysfunction.  LV end diastolic pressure is mildly elevated.  The left ventricular ejection fraction is 35-45% by visual  estimate.   Marcus KAYSHAWN OZBURN Sr. is a 86 y.o. male    [id number] LOCATION:  FACILITY: [name]  PHYSICIAN: Quay Burow, M.D. [birth year]   DATE OF PROCEDURE:  08/21/2018  DATE OF DISCHARGE:     CARDIAC CATHETERIZATION / PCI DES RCA    History obtained from chart review.Mr. Fuqua is an 7M with paroxysmal atrial fibrillation,myastheniagravis, hyperlipidemia and thrombocytopenia here with NSTEMI.    PROCEDURE DESCRIPTION:   The patient was brought to the second floor [facility] cath lab in the postabsorptive state. He was premedicated with IV Versed and fentanyl. His right wristwas prepped and shaved in usual sterile fashion. Xylocaine 1% was used for local anesthesia. A 6 French sheath was inserted into the right radial artery using standard Seldinger technique. The patient received 4000units  of heparin intravenously.  A 5 [place] TIG catheter and pigtail catheters were used for selective coronary angiography and left ventriculography respectively.  Isovue dye was used for the entirety of the case.  Retrograde aorta, left ventricular and pullback pressures were recorded.  Radial cocktail was administered via the SideArm sheath.  Patient received Brilinta 180 mg p.o. followed by Angiomax bolus and infusion with a therapeutic ACT demonstrated.  Using a 6 [place] JR4 guide catheter, 0.14 pro-water guidewire and a 2 mm x 12 mm balloon predilatation was performed to the proximal dominant RCA.  I then placed a 3 mm x 60 mm long Synergy drug-eluting stent across the diseased segment and deployed at 14 to 16 atm.  I then postdilated with a 3.25 x 12 mm long  noncompliant balloon up to 16 atm (3.3 mm) resulting reduction with 95% ulcerated proximal dominant RCA plaque to 0% residual.  There was distal spasm resulting in ST segment elevation which resolved with administration of intracoronary nitroglycerin.  I then attempted to cross the mid LAD CTO using a 6 [place] XB LAD 4 cm curve guide catheter, pro-water and whisper wire and 1.5 mm balloon.  I did ultimately cross the lesion but I did not think I was intraluminal and ultimately aborted the procedure.   IMPRESSION: Successful proximal RCA PCI and drug-eluting stenting using a synergy drug-eluting stent postdilated to 3.3 mm with unsuccessful attempt at crossing mid LAD CTO.  His LV function is moderately reduced in the 40% range.  His LAD CTO can be treated in a staged fashion electively.  The sheath was removed and a TR band was placed on the right wrist to achieve patent hemostasis.  The patient left the lab in stable condition.  Quay Burow. MD, [facility] 08/21/2018 5:20 PM     Recommend uninterrupted dual antiplatelet therapy with Aspirin 14m daily and Ticagrelor 964mtwice daily for a minimum of 12 months (ACS - Class I recommendation).     ASSESSMENT AND PLAN:  1.  CAD with s/p NSTEMI in November 2019 secondary to ruptured plaque lesion in the RCA. S/p DES. He has CTO of the mid LAD but has been asymptomatic. Given lack of symptoms or LV dysfunction in the anterior wall we have not recommended CTO PCI of the LAD. Will stop ASA due to need to go on anticoagulation.  Intolerant of Brilinta.   2. Hyperlipidemia. LDL now 67 at goal on dietary therapy. Given prior reaction apparently to statins will focus on dietary modification for now.   3. LV dysfunction acutely at time of MI. EF 45%. Off ARB due to elevated potassium. This may have been spurious since prior potassium levels were OK.  Euvolemic. No beta blocker due to history of bradycardia. Update Echo.  4. HTN. poorly controlled.  Go ahead and take amlodipine 5 mg daily  5. Myastenia gravis in remission  6. Hypothyroidism.   7. Atrial flutter new onset. Rate controlled with 4:1 conduction. On no AV nodal blocking agents. He is significantly symptomatic. Start Xarelto 15 mg daily. No ASA or NSAIDs. Check Echo. Follow up in 3-4 weeks for consideration of DCCV.   Current medicines are reviewed at length with the patient today.  The patient does not have concerns regarding medicines.  The following changes have been made:  no change  Follow up in 3-4 weeks.  Signed, Calven Gilkes Martinique, MD  03/04/2020 8:53 AM    [facility] [address], [place], [place], [address] Phone [phone], Fax [phone]

## 2020-03-03 NOTE — Progress Notes (Signed)
Cardiology Office Note    Date:  03/04/2020   ID:  Marcus [name] Sr., DOB [birth year], MRN [id number]  PCP:  [name], MD  Cardiologist:   Fernando Torry Martinique, MD   Chief Complaint  Patient presents with  . Atrial Flutter  . Coronary Artery Disease      History of Present Illness: Marcus A Burkman Sr. is a 86 y.o. male who is seen for follow up CAD. He has a history of myasthenia gravis and remote PAF in the setting of pneumonia July 2016.  He presented 08/20/2018 with a non-ST elevation MI.  08/21/2018 he underwent diagnostic catheterization and intervention with a DES to his RCA.   Patient did have a residual total LAD that Dr [name] attempted to cross but it appeared the wire got subintimal and so the procedure was aborted. He also has a 60% circumflex.  Echocardiogram showed an EF of 40% with moderate to severe MR.  Though his LVEDP at cath was normal he was felt to be volume overloaded.  He was diuresed and sent home on a diuretic as well.    On initial follow up he complained of persistent SOB. Was seen by Dr. Melvyn Novas who felt this was not a pulmonary issue. Later switched from Brilinta to Plavix with significant improvement in symptoms. He was considered for potential CTO PCI of the LAD but since he was asymptomatic this was not recommended.   He was seen recently in the ED on 03/01/20 after experiencing an episode of dyspnea and left arm pain while walking to his mailbox. He states prior to this he was very inactive and decided to start walking. He walked 30 minutes a day for 4 days before this occurred. In the ED he was found to be in atrial flutter with 4:1 conduction and controlled rate. He was severely hypertensive. Potassium was elevated at 5.8. troponin was 15>18. CXR NAD. Admission was considered but patient refused. Losartan was stopped and he was started on amlodipine and Imdur. Also placed on Xarelto. He has not started on any of these medications.   Notes BP at home is  typically 160/80. Had normal potassium on last lab work with Dr Forde Dandy. Notes now he is very exhausted with any exertion and lightheaded. No chest or arm pain. No dyspnea.     Past Medical History:  Diagnosis Date  . Acute on chronic combined systolic and diastolic CHF (congestive heart failure) ([place]) 08/22/2018  . Acute on chronic respiratory failure ([place]) 04/15/2015  . CAD in native artery 08/22/2018  . CAP (community acquired pneumonia) 04/13/2015   See cxr 04/13/2015 > admit [facility]    . H/O hiatal hernia   . Hyperlipidemia LDL goal <70 08/22/2018  . Hypertension 08/22/2018  . Hypothyroidism 04/13/2015  . Myasthenia gravis ([place]) 04/13/2015  . Obesity 04/13/2015  . Paroxysmal A-fib ([place]) 04/16/2015  . S/P angioplasty with stent 08/21/18 DES to RCA 08/22/2018  . Sepsis ([place]) 04/15/2015    Past Surgical History:  Procedure Laterality Date  . AMPUTATION  06/11/2012   Procedure: AMPUTATION DIGIT;  Surgeon: [name], MD;  Location: [facility];  Service: Orthopedics;  Laterality: Left;  revision of amputation  . CORONARY STENT INTERVENTION N/A 08/21/2018   Procedure: CORONARY STENT INTERVENTION;  Surgeon: [name], MD;  Location: [facility];  Service: Cardiovascular;  Laterality: N/A;  . HERNIA REPAIR  1994  . LEFT HEART CATH AND CORONARY ANGIOGRAPHY N/A 08/21/2018   Procedure: LEFT HEART  CATH AND CORONARY ANGIOGRAPHY;  Surgeon: Berry, Jonathan J, MD;  Location: [facility];  Service: Cardiovascular;  Laterality: N/A;  . ORIF SHOULDER FRACTURE  06/13/2012   Procedure: OPEN REDUCTION INTERNAL FIXATION (ORIF) SHOULDER FRACTURE;  Surgeon: Steven R Norris, MD;  Location: [facility];  Service: Orthopedics;  Laterality: Left;  LEFT SHOULDER OPEN GREATER TUBEROSITY ORIF  . SHOULDER CLOSED REDUCTION  06/11/2012   Procedure: CLOSED MANIPULATION SHOULDER;  Surgeon: Fred W Ortmann, MD;  Location: [facility];  Service: Orthopedics;  Laterality: Left;     Current Outpatient Medications    Medication Sig Dispense Refill  . BESIVANCE 0.6 % SUSP Place 1 drop into the right eye daily.    . levothyroxine (SYNTHROID) 100 MCG tablet Take 100 mcg by mouth daily before breakfast.    . MAXITROL 3.5-10000-0.1 OINT Place 1 application into the right eye at bedtime.     . nitroGLYCERIN (NITROSTAT) 0.4 MG SL tablet Place 1 tablet (0.4 mg total) under the tongue every 5 (five) minutes x 3 doses as needed for chest pain. 25 tablet 4  . amLODipine (NORVASC) 5 MG tablet Take 1 tablet (5 mg total) by mouth daily. (Patient not taking: Reported on 03/04/2020) 30 tablet 0  . Rivaroxaban (XARELTO) 15 MG TABS tablet Take 1 tablet (15 mg total) by mouth daily with supper. (Patient not taking: Reported on 03/04/2020) 30 tablet 0   No current facility-administered medications for this visit.    Allergies:   Beta adrenergic blockers and Statins    Social History:  The patient  reports that he quit smoking about 38 years ago. His smoking use included cigarettes. He has a 30.00 pack-year smoking history. He has never used smokeless tobacco. He reports current alcohol use of about 2.0 standard drinks of alcohol per week. He reports that he does not use drugs.   Family History:  The patient's family history includes Healthy in his daughter, sister, and son; Heart attack in his father; Other in his mother.    ROS:  Please see the history of present illness.   Otherwise, review of systems are positive for none.   All other systems are reviewed and negative.    PHYSICAL EXAM: VS:  BP (!) 198/102   Pulse (!) 56   Temp (!) 97.2 F (36.2 C)   Ht 5' 8" (1.727 m)   Wt 164 lb 9.6 oz (74.7 kg)   BMI 25.03 kg/m  , BMI Body mass index is 25.03 kg/m. GEN: Well nourished, well developed, in no acute distress  HEENT: normal  Neck: no JVD, carotid bruits, or masses Cardiac: IRRR; gr 2/6 systolic murmur RUSB, rubs, or gallops,no edema  Respiratory:  clear to auscultation bilaterally, normal work of breathing GI:  soft, nontender, nondistended, + BS MS: no deformity or atrophy. Pedal pulses are excellent. Skin: warm and dry, no rash Neuro:  Strength and sensation are intact Psych: euthymic mood, full affect   EKG:  EKG is ordered today. ECG done 03/01/20 showed atrial flutter rate 59. LAFB, RBBB. Atrial flutter new since September 2020. Ecg today is unchanged with Aflutter rate 56. I have personally reviewed and interpreted this study.     Lab Results  Component Value Date   WBC 6.2 03/01/2020   HGB 11.1 (L) 03/01/2020   HCT 34.6 (L) 03/01/2020   PLT 120 (L) 03/01/2020   GLUCOSE 92 03/01/2020   CHOL 128 06/05/2019   TRIG 109 06/05/2019   HDL 41 06/05/2019   LDLCALC   67 06/05/2019   ALT 9 06/05/2019   AST 16 06/05/2019   NA 139 03/01/2020   K 5.8 (H) 03/01/2020   CL 108 03/01/2020   CREATININE 1.20 03/01/2020   BUN 28 (H) 03/01/2020   CO2 23 03/01/2020   TSH 1.28 10/03/2016   Dated 01/12/20: cholesterol 135, triglycerides 93, HDL 36, LDL 80.TSH normal.    Wt Readings from Last 3 Encounters:  03/04/20 164 lb 9.6 oz (74.7 kg)  03/01/20 165 lb (74.8 kg)  11/30/19 162 lb 3.2 oz (73.6 kg)      Other studies Reviewed: Additional studies/ records that were reviewed today include:   Echo 10/22/16: Study Conclusions  - Left ventricle: The cavity size was normal. There was moderate   concentric hypertrophy. Systolic function was normal. The   estimated ejection fraction was in the range of 55% to 60%. Wall   motion was normal; there were no regional wall motion   abnormalities. Features are consistent with a pseudonormal left   ventricular filling pattern, with concomitant abnormal relaxation   and increased filling pressure (grade 2 diastolic dysfunction). - Aortic valve: There was mild regurgitation. Valve area (VTI):   2.17 cm^2. - Mitral valve: Calcified annulus. There was mild regurgitation. - Left atrium: The atrium was moderately dilated. - Pulmonary arteries: PA peak  pressure: 32 mm Hg (S).   Echo 08/21/18: Study Conclusions  - Left ventricle: The cavity size was normal. Wall thickness was   increased in a pattern of mild LVH. Mid to apical inferior   hypokinesis. Hypokinesis of the apex. The estimated ejection   fraction was 45%. Features are consistent with a pseudonormal   left ventricular filling pattern, with concomitant abnormal   relaxation and increased filling pressure (grade 2 diastolic   dysfunction). - Aortic valve: Trileaflet; moderately calcified leaflets. There   was mild stenosis. There was moderate eccentric regurgitation.   Mean gradient (S): 10 mm Hg. Valve area (VTI): 1.73 cm^2. - Mitral valve: Moderately calcified annulus. Mildly calcified   leaflets. There is some degree of restriction of the posterior   leaflet. There was moderate to severe regurgitation. - Left atrium: The atrium was moderately dilated. - Right ventricle: The cavity size was normal. Systolic function   was normal. - Right atrium: The atrium was moderately to severely dilated. - Tricuspid valve: There was moderate regurgitation. Peak RV-RA   gradient 39 mmHg. - Pulmonary arteries: PA peak pressure: 47 mm Hg (S). - Systemic veins: IVC measured 2.5 cm with > 50% respirophasic   variation, suggesting RA pressure 8 mmHg.  Impressions:  - Normal LV size with mild LV hypertrophy. EF 45% with mid to   apical inferior hypokinesis and hypokinesis of the apex. Moderate   diastolic dysfunction. Normal RV size and systolic function.   Biatrial enlargement. Moderate to severe mitral regurgitation   with restriction of the posterior leaflet. Mild aortic stenosis   with moderate eccentric regurgitation. Mild pulmonary   hypertension.   Cardiac cath/PCI: 08/21/18: CORONARY STENT INTERVENTION  LEFT HEART CATH AND CORONARY ANGIOGRAPHY  Conclusion     Prox RCA lesion is 95% stenosed.  Ost Cx lesion is 60% stenosed.  Prox Cx lesion is 60%  stenosed.  Prox LAD lesion is 100% stenosed.  A drug-eluting stent was successfully placed.  Post intervention, there is a 0% residual stenosis.  There is moderate left ventricular systolic dysfunction.  LV end diastolic pressure is mildly elevated.  The left ventricular ejection fraction is 35-45% by visual  estimate.   Marcus KAYSHAWN OZBURN Sr. is a 86 y.o. male    [id number] LOCATION:  FACILITY: [name]  PHYSICIAN: Quay Burow, M.D. [birth year]   DATE OF PROCEDURE:  08/21/2018  DATE OF DISCHARGE:     CARDIAC CATHETERIZATION / PCI DES RCA    History obtained from chart review.Mr. Fuqua is an 7M with paroxysmal atrial fibrillation,myastheniagravis, hyperlipidemia and thrombocytopenia here with NSTEMI.    PROCEDURE DESCRIPTION:   The patient was brought to the second floor [facility]iac cath lab in the postabsorptive state. He was premedicated with IV Versed and fentanyl. His right wristwas prepped and shaved in usual sterile fashion. Xylocaine 1% was used for local anesthesia. A 6 French sheath was inserted into the right radial artery using standard Seldinger technique. The patient received 4000units  of heparin intravenously.  A 5 [place] TIG catheter and pigtail catheters were used for selective coronary angiography and left ventriculography respectively.  Isovue dye was used for the entirety of the case.  Retrograde aorta, left ventricular and pullback pressures were recorded.  Radial cocktail was administered via the SideArm sheath.  Patient received Brilinta 180 mg p.o. followed by Angiomax bolus and infusion with a therapeutic ACT demonstrated.  Using a 6 [place] JR4 guide catheter, 0.14 pro-water guidewire and a 2 mm x 12 mm balloon predilatation was performed to the proximal dominant RCA.  I then placed a 3 mm x 60 mm long Synergy drug-eluting stent across the diseased segment and deployed at 14 to 16 atm.  I then postdilated with a 3.25 x 12 mm long  noncompliant balloon up to 16 atm (3.3 mm) resulting reduction with 95% ulcerated proximal dominant RCA plaque to 0% residual.  There was distal spasm resulting in ST segment elevation which resolved with administration of intracoronary nitroglycerin.  I then attempted to cross the mid LAD CTO using a 6 [place] XB LAD 4 cm curve guide catheter, pro-water and whisper wire and 1.5 mm balloon.  I did ultimately cross the lesion but I did not think I was intraluminal and ultimately aborted the procedure.   IMPRESSION: Successful proximal RCA PCI and drug-eluting stenting using a synergy drug-eluting stent postdilated to 3.3 mm with unsuccessful attempt at crossing mid LAD CTO.  His LV function is moderately reduced in the 40% range.  His LAD CTO can be treated in a staged fashion electively.  The sheath was removed and a TR band was placed on the right wrist to achieve patent hemostasis.  The patient left the lab in stable condition.  Quay Burow. MD, [facility] 08/21/2018 5:20 PM     Recommend uninterrupted dual antiplatelet therapy with Aspirin 14m daily and Ticagrelor 964mtwice daily for a minimum of 12 months (ACS - Class I recommendation).     ASSESSMENT AND PLAN:  1.  CAD with s/p NSTEMI in November 2019 secondary to ruptured plaque lesion in the RCA. S/p DES. He has CTO of the mid LAD but has been asymptomatic. Given lack of symptoms or LV dysfunction in the anterior wall we have not recommended CTO PCI of the LAD. Will stop ASA due to need to go on anticoagulation.  Intolerant of Brilinta.   2. Hyperlipidemia. LDL now 67 at goal on dietary therapy. Given prior reaction apparently to statins will focus on dietary modification for now.   3. LV dysfunction acutely at time of MI. EF 45%. Off ARB due to elevated potassium. This may have been spurious since prior potassium levels were OK.  Euvolemic. No beta blocker due to history of bradycardia. Update Echo.  4. HTN. poorly controlled.  Go ahead and take amlodipine 5 mg daily  5. Myastenia gravis in remission  6. Hypothyroidism.   7. Atrial flutter new onset. Rate controlled with 4:1 conduction. On no AV nodal blocking agents. He is significantly symptomatic. Start Xarelto 15 mg daily. No ASA or NSAIDs. Check Echo. Follow up in 3-4 weeks for consideration of DCCV.   Current medicines are reviewed at length with the patient today.  The patient does not have concerns regarding medicines.  The following changes have been made:  no change  Follow up in 3-4 weeks.  Signed, Nirel Babler Martinique, MD  03/04/2020 8:53 AM    [facility] [address], [place], [place], [address] Phone [phone], Fax [phone]

## 2020-03-04 ENCOUNTER — Ambulatory Visit (INDEPENDENT_AMBULATORY_CARE_PROVIDER_SITE_OTHER): Payer: Medicare Other | Admitting: Cardiology

## 2020-03-04 ENCOUNTER — Encounter: Payer: Self-pay | Admitting: Cardiology

## 2020-03-04 ENCOUNTER — Other Ambulatory Visit: Payer: Self-pay

## 2020-03-04 VITALS — BP 198/102 | HR 56 | Temp 97.2°F | Ht 68.0 in | Wt 164.6 lb

## 2020-03-04 DIAGNOSIS — I483 Typical atrial flutter: Secondary | ICD-10-CM | POA: Diagnosis not present

## 2020-03-04 DIAGNOSIS — I5022 Chronic systolic (congestive) heart failure: Secondary | ICD-10-CM

## 2020-03-04 DIAGNOSIS — I10 Essential (primary) hypertension: Secondary | ICD-10-CM

## 2020-03-04 DIAGNOSIS — I2510 Atherosclerotic heart disease of native coronary artery without angina pectoris: Secondary | ICD-10-CM

## 2020-03-04 DIAGNOSIS — E7800 Pure hypercholesterolemia, unspecified: Secondary | ICD-10-CM | POA: Diagnosis not present

## 2020-03-04 DIAGNOSIS — I4892 Unspecified atrial flutter: Secondary | ICD-10-CM | POA: Diagnosis not present

## 2020-03-04 NOTE — Patient Instructions (Signed)
Go ahead and start Xarelto 15 mg daily. Stop taking ASA. Do not use nonsteroidal anti-inflammatory drugs like Aleve, Advil, ibuprofen, Mobic  Start Amlodipine 5 mg daily  Stay off losartan for now.   You do not need to take Imdur  We will schedule you for an Echocardiogram  Follow up in 3 weeks.

## 2020-03-07 ENCOUNTER — Other Ambulatory Visit: Payer: Self-pay

## 2020-03-07 ENCOUNTER — Ambulatory Visit (HOSPITAL_COMMUNITY): Payer: Medicare Other | Attending: Cardiovascular Disease

## 2020-03-07 DIAGNOSIS — I4892 Unspecified atrial flutter: Secondary | ICD-10-CM | POA: Insufficient documentation

## 2020-03-07 DIAGNOSIS — I2510 Atherosclerotic heart disease of native coronary artery without angina pectoris: Secondary | ICD-10-CM | POA: Diagnosis not present

## 2020-03-08 DIAGNOSIS — H1820 Unspecified corneal edema: Secondary | ICD-10-CM | POA: Diagnosis not present

## 2020-03-10 ENCOUNTER — Telehealth: Payer: Self-pay

## 2020-03-10 DIAGNOSIS — Z01812 Encounter for preprocedural laboratory examination: Secondary | ICD-10-CM

## 2020-03-10 DIAGNOSIS — I480 Paroxysmal atrial fibrillation: Secondary | ICD-10-CM

## 2020-03-10 NOTE — Telephone Encounter (Addendum)
Pt scheduled for DCCV for Atrial Flutter per Dr. Martinique for Dr. [name] on March 24, 2020 with covid testing 03/21/20. Pt had labs 03/01/20 cbc and bmet. Msg to Malachy Mood if pt will need repeat labs prior to his procedure.   OK we can go ahead and arrange cardioversion after June 23rd and keep appointment with me on the 29th   Peter Martinique MD, [facility]    Pt verbalized understanding of his instructions and also sent to his My Chart.   Message sent to [place].   Pt to keep 03/29/20 appt with Dr. Martinique as previously planned.

## 2020-03-11 ENCOUNTER — Other Ambulatory Visit: Payer: Self-pay | Admitting: Cardiology

## 2020-03-11 DIAGNOSIS — H0102B Squamous blepharitis left eye, upper and lower eyelids: Secondary | ICD-10-CM | POA: Diagnosis not present

## 2020-03-11 DIAGNOSIS — H02131 Senile ectropion of right upper eyelid: Secondary | ICD-10-CM | POA: Diagnosis not present

## 2020-03-11 DIAGNOSIS — Z961 Presence of intraocular lens: Secondary | ICD-10-CM | POA: Diagnosis not present

## 2020-03-11 DIAGNOSIS — H02134 Senile ectropion of left upper eyelid: Secondary | ICD-10-CM | POA: Diagnosis not present

## 2020-03-11 DIAGNOSIS — H0102A Squamous blepharitis right eye, upper and lower eyelids: Secondary | ICD-10-CM | POA: Diagnosis not present

## 2020-03-11 DIAGNOSIS — H18231 Secondary corneal edema, right eye: Secondary | ICD-10-CM | POA: Diagnosis not present

## 2020-03-14 ENCOUNTER — Other Ambulatory Visit: Payer: Self-pay | Admitting: Cardiology

## 2020-03-14 ENCOUNTER — Other Ambulatory Visit: Payer: Self-pay

## 2020-03-14 DIAGNOSIS — Z01812 Encounter for preprocedural laboratory examination: Secondary | ICD-10-CM

## 2020-03-14 DIAGNOSIS — I480 Paroxysmal atrial fibrillation: Secondary | ICD-10-CM

## 2020-03-14 NOTE — Telephone Encounter (Signed)
Spoke to patient advised he will need to repeat bmet this week before cardioversion scheduled 03/24/20.

## 2020-03-15 DIAGNOSIS — Z01812 Encounter for preprocedural laboratory examination: Secondary | ICD-10-CM | POA: Diagnosis not present

## 2020-03-15 DIAGNOSIS — I480 Paroxysmal atrial fibrillation: Secondary | ICD-10-CM | POA: Diagnosis not present

## 2020-03-16 LAB — BASIC METABOLIC PANEL
BUN/Creatinine Ratio: 15 (ref 10–24)
BUN: 19 mg/dL (ref 8–27)
CO2: 21 mmol/L (ref 20–29)
Calcium: 8.5 mg/dL — ABNORMAL LOW (ref 8.6–10.2)
Chloride: 105 mmol/L (ref 96–106)
Creatinine, Ser: 1.25 mg/dL (ref 0.76–1.27)
GFR calc Af Amer: 60 mL/min/{1.73_m2} (ref >59)
GFR calc non Af Amer: 52 mL/min/{1.73_m2} — ABNORMAL LOW (ref >59)
Glucose: 90 mg/dL (ref 65–99)
Potassium: 5.4 mmol/L — ABNORMAL HIGH (ref 3.5–5.2)
Sodium: 138 mmol/L (ref 134–144)

## 2020-03-21 ENCOUNTER — Other Ambulatory Visit (HOSPITAL_COMMUNITY)
Admission: RE | Admit: 2020-03-21 | Discharge: 2020-03-21 | Disposition: A | Discharge status: Home / Self Care | Payer: Medicare Other | Source: Ambulatory Visit | Attending: Cardiovascular Disease | Admitting: Cardiovascular Disease

## 2020-03-21 DIAGNOSIS — Z20822 Contact with and (suspected) exposure to covid-19: Secondary | ICD-10-CM | POA: Diagnosis not present

## 2020-03-21 DIAGNOSIS — Z01812 Encounter for preprocedural laboratory examination: Secondary | ICD-10-CM | POA: Diagnosis present

## 2020-03-21 LAB — SARS CORONAVIRUS 2 (TAT 6-24 HRS): SARS Coronavirus 2: NEGATIVE

## 2020-03-21 MED ORDER — AMLODIPINE BESYLATE 5 MG PO TABS: 5.0000 mg | ORAL_TABLET | Freq: Every day | ORAL | 5 refills | Status: DC

## 2020-03-21 MED ORDER — RIVAROXABAN 15 MG PO TABS: 15.0000 mg | ORAL_TABLET | Freq: Every day | ORAL | 5 refills | Status: DC

## 2020-03-23 ENCOUNTER — Other Ambulatory Visit (HOSPITAL_COMMUNITY): Payer: Medicare Other

## 2020-03-24 ENCOUNTER — Ambulatory Visit (HOSPITAL_COMMUNITY): Payer: Medicare Other | Admitting: Certified Registered Nurse Anesthetist

## 2020-03-24 ENCOUNTER — Ambulatory Visit (HOSPITAL_COMMUNITY)
Admission: RE | Admit: 2020-03-24 | Discharge: 2020-03-24 | Disposition: A | Discharge status: Home / Self Care | Payer: Medicare Other | Attending: Cardiovascular Disease | Admitting: Cardiovascular Disease

## 2020-03-24 ENCOUNTER — Encounter (HOSPITAL_COMMUNITY): Payer: Self-pay | Admitting: Cardiovascular Disease

## 2020-03-24 ENCOUNTER — Encounter (HOSPITAL_COMMUNITY)
Admission: RE | Disposition: A | Discharge status: Home / Self Care | Payer: Self-pay | Source: Home / Self Care | Attending: Cardiovascular Disease

## 2020-03-24 ENCOUNTER — Other Ambulatory Visit: Payer: Self-pay

## 2020-03-24 DIAGNOSIS — I480 Paroxysmal atrial fibrillation: Secondary | ICD-10-CM | POA: Insufficient documentation

## 2020-03-24 DIAGNOSIS — G7000 Myasthenia gravis without (acute) exacerbation: Secondary | ICD-10-CM | POA: Insufficient documentation

## 2020-03-24 DIAGNOSIS — I5042 Chronic combined systolic (congestive) and diastolic (congestive) heart failure: Secondary | ICD-10-CM | POA: Insufficient documentation

## 2020-03-24 DIAGNOSIS — Z79890 Hormone replacement therapy (postmenopausal): Secondary | ICD-10-CM | POA: Insufficient documentation

## 2020-03-24 DIAGNOSIS — I4892 Unspecified atrial flutter: Secondary | ICD-10-CM | POA: Diagnosis not present

## 2020-03-24 DIAGNOSIS — I252 Old myocardial infarction: Secondary | ICD-10-CM | POA: Diagnosis not present

## 2020-03-24 DIAGNOSIS — Z7901 Long term (current) use of anticoagulants: Secondary | ICD-10-CM | POA: Diagnosis not present

## 2020-03-24 DIAGNOSIS — Z79899 Other long term (current) drug therapy: Secondary | ICD-10-CM | POA: Diagnosis not present

## 2020-03-24 DIAGNOSIS — I110 Hypertensive heart disease with heart failure: Secondary | ICD-10-CM | POA: Insufficient documentation

## 2020-03-24 DIAGNOSIS — I4819 Other persistent atrial fibrillation: Secondary | ICD-10-CM | POA: Diagnosis not present

## 2020-03-24 DIAGNOSIS — Z888 Allergy status to other drugs, medicaments and biological substances status: Secondary | ICD-10-CM | POA: Insufficient documentation

## 2020-03-24 DIAGNOSIS — Z6825 Body mass index (BMI) 25.0-25.9, adult: Secondary | ICD-10-CM | POA: Diagnosis not present

## 2020-03-24 DIAGNOSIS — Z87891 Personal history of nicotine dependence: Secondary | ICD-10-CM | POA: Diagnosis not present

## 2020-03-24 DIAGNOSIS — I2510 Atherosclerotic heart disease of native coronary artery without angina pectoris: Secondary | ICD-10-CM | POA: Insufficient documentation

## 2020-03-24 DIAGNOSIS — E785 Hyperlipidemia, unspecified: Secondary | ICD-10-CM | POA: Insufficient documentation

## 2020-03-24 DIAGNOSIS — E039 Hypothyroidism, unspecified: Secondary | ICD-10-CM | POA: Insufficient documentation

## 2020-03-24 DIAGNOSIS — Z955 Presence of coronary angioplasty implant and graft: Secondary | ICD-10-CM | POA: Insufficient documentation

## 2020-03-24 DIAGNOSIS — E669 Obesity, unspecified: Secondary | ICD-10-CM | POA: Insufficient documentation

## 2020-03-24 HISTORY — DX: Other complications of anesthesia, initial encounter: T88.59XA

## 2020-03-24 HISTORY — PX: CARDIOVERSION: SHX1299

## 2020-03-24 SURGERY — CARDIOVERSION
Anesthesia: General

## 2020-03-24 MED ORDER — PROPOFOL 10 MG/ML IV BOLUS
INTRAVENOUS | Status: DC | PRN
  Administered 2020-03-24: 60 mg via INTRAVENOUS

## 2020-03-24 MED ORDER — SODIUM CHLORIDE 0.9 % IV SOLN: INTRAVENOUS | Status: DC

## 2020-03-24 MED ORDER — SODIUM CHLORIDE 0.9 % IV SOLN: INTRAVENOUS | Status: DC | PRN

## 2020-03-24 MED ORDER — LIDOCAINE 2% (20 MG/ML) 5 ML SYRINGE
INTRAMUSCULAR | Status: DC | PRN
  Administered 2020-03-24: 50 mg via INTRAVENOUS

## 2020-03-24 NOTE — Interval H&P Note (Signed)
History and Physical Interval Note:  03/24/2020 2:33 PM  Marcus A Smiles Sr.  has presented today for surgery, with the diagnosis of AFLUTTER.  The various methods of treatment have been discussed with the patient and family. After consideration of risks, benefits and other options for treatment, the patient has consented to  Procedure(s): CARDIOVERSION (N/A) as a surgical intervention.  The patient's history has been reviewed, patient examined, no change in status, stable for surgery.  I have reviewed the patient's chart and labs.  Questions were answered to the patient's satisfaction.     Mertie Moores

## 2020-03-24 NOTE — Anesthesia Preprocedure Evaluation (Signed)
Anesthesia Evaluation  Patient identified by MRN, date of birth, ID band Patient awake    Reviewed: Allergy & Precautions, NPO status , Patient's Chart, lab work & pertinent test results  History of Anesthesia Complications (+) history of anesthetic complications  Airway Mallampati: II  TM Distance: >3 FB Neck ROM: Full    Dental no notable dental hx. (+) Teeth Intact, Caps   Pulmonary asthma , pneumonia, resolved, former smoker,    Pulmonary exam normal breath sounds clear to auscultation       Cardiovascular hypertension, Pt. on medications + CAD, + Past MI and +CHF  Normal cardiovascular exam+ dysrhythmias Atrial Fibrillation  Rhythm:Regular Rate:Normal  Echo 03/07/20 1. Left ventricular ejection fraction, by estimation, is 60 to 65%. The left ventricle has normal function. The left ventricle has no regional wall motion abnormalities. Left ventricular diastolic function could not be evaluated.  2. Right ventricular systolic function is normal. The right ventricular size is normal. There is mildly elevated pulmonary artery systolic pressure.  3. Left atrial size was severely dilated.  4. Right atrial size was severely dilated.  5. The mitral valve is normal in structure. Mild mitral valve  regurgitation. No evidence of mitral stenosis.  6. Tricuspid valve regurgitation is moderate.  7. The aortic valve is grossly normal. Aortic valve regurgitation is mild. No aortic stenosis is present.  8. Aortic dilatation noted. There is mild dilatation of the ascending  aorta measuring 39 mm.    Neuro/Psych Hx/o Myasthenia gravis- in remission  Neuromuscular disease    GI/Hepatic Neg liver ROS, hiatal hernia,   Endo/Other  Hypothyroidism   Renal/GU Renal disease  negative genitourinary   Musculoskeletal   Abdominal   Peds  Hematology negative hematology ROS (+)   Anesthesia Other Findings   Reproductive/Obstetrics                              Anesthesia Physical Anesthesia Plan  ASA: III  Anesthesia Plan: General   Post-op Pain Management:    Induction: Intravenous  PONV Risk Score and Plan: 2  Airway Management Planned: Natural Airway and Mask  Additional Equipment:   Intra-op Plan:   Post-operative Plan:   Informed Consent: I have reviewed the patients History and Physical, chart, labs and discussed the procedure including the risks, benefits and alternatives for the proposed anesthesia with the patient or authorized representative who has indicated his/her understanding and acceptance.     Dental advisory given  Plan Discussed with: CRNA  Anesthesia Plan Comments:         Anesthesia Quick Evaluation

## 2020-03-24 NOTE — Anesthesia Postprocedure Evaluation (Signed)
Anesthesia Post Note  Patient: Marcus [name].  Procedure(s) Performed: CARDIOVERSION (N/A )     Patient location during evaluation: PACU Anesthesia Type: General Level of consciousness: awake and alert and oriented Pain management: pain level controlled Vital Signs Assessment: post-procedure vital signs reviewed and stable Respiratory status: spontaneous breathing, nonlabored ventilation and respiratory function stable Cardiovascular status: blood pressure returned to baseline and stable Postop Assessment: no apparent nausea or vomiting Anesthetic complications: no   No complications documented.  Last Vitals:  Vitals:   03/24/20 1130 03/24/20 1140  BP: (!) 189/78 (!) 174/90  Pulse: 72 74  Resp: (!) 23 17  Temp:    SpO2: 99% 97%    Last Pain:  Vitals:   03/24/20 1140  TempSrc:   PainSc: 0-No pain                 Jaquesha Boroff A.

## 2020-03-24 NOTE — Transfer of Care (Signed)
Immediate Anesthesia Transfer of Care Note  Patient: Marcus A Firestine Sr.  Procedure(s) Performed: CARDIOVERSION (N/A )  Patient Location: Endoscopy Unit  Anesthesia Type:General  Level of Consciousness: awake and drowsy  Airway & Oxygen Therapy: Patient Spontanous Breathing  Post-op Assessment: Report given to RN and Post -op Vital signs reviewed and stable  Post vital signs: Reviewed and stable  Last Vitals:  Vitals Value Taken Time  BP    Temp    Pulse    Resp    SpO2      Last Pain:  Vitals:   03/24/20 1050  TempSrc: Oral         Complications: No complications documented.

## 2020-03-24 NOTE — Discharge Instructions (Signed)
Electrical Cardioversion Electrical cardioversion is the delivery of a jolt of electricity to restore a normal rhythm to the heart. A rhythm that is too fast or is not regular keeps the heart from pumping well. In this procedure, sticky patches or metal paddles are placed on the chest to deliver electricity to the heart from a device. This procedure may be done in an emergency if:  There is low or no blood pressure as a result of the heart rhythm.  Normal rhythm must be restored as fast as possible to protect the brain and heart from further damage.  It may save a life. This may also be a scheduled procedure for irregular or fast heart rhythms that are not immediately life-threatening. Tell a health care provider about:  Any allergies you have.  All medicines you are taking, including vitamins, herbs, eye drops, creams, and over-the-counter medicines.  Any problems you or family members have had with anesthetic medicines.  Any blood disorders you have.  Any surgeries you have had.  Any medical conditions you have.  Whether you are pregnant or may be pregnant. What are the risks? Generally, this is a safe procedure. However, problems may occur, including:  Allergic reactions to medicines.  A blood clot that breaks free and travels to other parts of your body.  The possible return of an abnormal heart rhythm within hours or days after the procedure.  Your heart stopping (cardiac arrest). This is rare. What happens before the procedure? Medicines  Your health care provider may have you start taking: ? Blood-thinning medicines (anticoagulants) so your blood does not clot as easily. ? Medicines to help stabilize your heart rate and rhythm.  Ask your health care provider about: ? Changing or stopping your regular medicines. This is especially important if you are taking diabetes medicines or blood thinners. ? Taking medicines such as aspirin and ibuprofen. These medicines can  thin your blood. Do not take these medicines unless your health care provider tells you to take them. ? Taking over-the-counter medicines, vitamins, herbs, and supplements. General instructions  Follow instructions from your health care provider about eating or drinking restrictions.  Plan to have someone take you home from the hospital or clinic.  If you will be going home right after the procedure, plan to have someone with you for 24 hours.  Ask your health care provider what steps will be taken to help prevent infection. These may include washing your skin with a germ-killing soap. What happens during the procedure?   An IV will be inserted into one of your veins.  Sticky patches (electrodes) or metal paddles may be placed on your chest.  You will be given a medicine to help you relax (sedative).  An electrical shock will be delivered. The procedure may vary among health care providers and hospitals. What can I expect after the procedure?  Your blood pressure, heart rate, breathing rate, and blood oxygen level will be monitored until you leave the hospital or clinic.  Your heart rhythm will be watched to make sure it does not change.  You may have some redness on the skin where the shocks were given. Follow these instructions at home:  Do not drive for 24 hours if you were given a sedative during your procedure.  Take over-the-counter and prescription medicines only as told by your health care provider.  Ask your health care provider how to check your pulse. Check it often.  Rest for 48 hours after the procedure or   as told by your health care provider.  Avoid or limit your caffeine use as told by your health care provider.  Keep all follow-up visits as told by your health care provider. This is important. Contact a health care provider if:  You feel like your heart is beating too quickly or your pulse is not regular.  You have a serious muscle cramp that does not go  away. Get help right away if:  You have discomfort in your chest.  You are dizzy or you feel faint.  You have trouble breathing or you are short of breath.  Your speech is slurred.  You have trouble moving an arm or leg on one side of your body.  Your fingers or toes turn cold or blue. Summary  Electrical cardioversion is the delivery of a jolt of electricity to restore a normal rhythm to the heart.  This procedure may be done right away in an emergency or may be a scheduled procedure if the condition is not an emergency.  Generally, this is a safe procedure.  After the procedure, check your pulse often as told by your health care provider. This information is not intended to replace advice given to you by your health care provider. Make sure you discuss any questions you have with your health care provider. Document Revised: 04/20/2019 Document Reviewed: 04/20/2019 Elsevier Patient Education  2020 Elsevier Inc.  

## 2020-03-24 NOTE — Anesthesia Procedure Notes (Signed)
Procedure Name: General with mask airway Date/Time: 03/24/2020 11:05 AM Performed by: Harden Mo, CRNA Pre-anesthesia Checklist: Patient identified, Emergency Drugs available, Suction available and Patient being monitored Patient Re-evaluated:Patient Re-evaluated prior to induction Oxygen Delivery Method: Ambu bag Preoxygenation: Pre-oxygenation with 100% oxygen Induction Type: IV induction Placement Confirmation: positive ETCO2 and breath sounds checked- equal and bilateral Dental Injury: Teeth and Oropharynx as per pre-operative assessment

## 2020-03-24 NOTE — CV Procedure (Signed)
.     Cardioversion Note  HEYWOOD TOKUNAGA Sr. [id number] 10/14/1931  Procedure: DC Cardioversion Indications: atrial fib   Procedure Details Consent: Obtained Time Out: Verified patient identification, verified procedure, site/side was marked, verified correct patient position, special equipment/implants available, Radiology Safety Procedures followed,  medications/allergies/relevent history reviewed, required imaging and test results available.  Performed  The patient has been on adequate anticoagulation.  The patient received IV Lidocaine 50 mg followed by Propofol 60 mg IV  for sedation.  Synchronous cardioversion was performed at  120  joules.  The cardioversion was successful     Complications: No apparent complications Patient did tolerate procedure well.   Thayer Headings, Brooke Bonito., MD, [facility] 03/24/2020, 11:16 AM

## 2020-03-25 NOTE — Progress Notes (Signed)
Cardiology Office Note    Date:  03/29/2020   ID:  [name], DOB 1931/10/18, MRN [id number]  PCP:  [name], MD  Cardiologist:   Erma Raiche Martinique, MD   No chief complaint on file.     History of Present Illness: GERAD CORNELIO Sr. is a 86 y.o. male who is seen for follow up CAD. He has a history of myasthenia gravis and remote PAF in the setting of pneumonia July 2016.  He presented 08/20/2018 with a non-ST elevation MI.  08/21/2018 he underwent diagnostic catheterization and intervention with a DES to his RCA.   Patient did have a residual total LAD that Dr [name] attempted to cross but it appeared the wire got subintimal and so the procedure was aborted. He also has a 60% circumflex.  Echocardiogram showed an EF of 40% with moderate to severe MR.  Though his LVEDP at cath was normal he was felt to be volume overloaded.  He was diuresed and sent home on a diuretic as well.    On initial follow up he complained of persistent SOB. Was seen by Dr. Melvyn Novas who felt this was not a pulmonary issue. Later switched from Brilinta to Plavix with significant improvement in symptoms. He was considered for potential CTO PCI of the LAD but since he was asymptomatic this was not recommended.   He was seen recently in the ED on 03/01/20 after experiencing an episode of dyspnea and left arm pain while walking to his mailbox. He states prior to this he was very inactive and decided to start walking. He walked 30 minutes a day for 4 days before this occurred. In the ED he was found to be in atrial flutter with 4:1 conduction and controlled rate. He was severely hypertensive. Potassium was elevated at 5.8. troponin was 15>18. CXR NAD. Admission was considered but patient refused. Losartan was stopped and he was started on amlodipine. Also placed on Xarelto. Repeat Echo done and showed improvement in LV function with normal EF. Only mild MR. Severe biatrial enlargement. After anticoagulation for 3-4  weeks he did undergo successful DCCV on 03/24/20. He did call this past weekend with complaints of increased swelling. He was started on lasix 20 mg daily. Noted significant improvement in his edema. He has lost 5 lbs and weight is back to baseline. He is feeling much better.   Past Medical History:  Diagnosis Date   Acute on chronic combined systolic and diastolic CHF (congestive heart failure) ([employer]) 08/22/2018   Acute on chronic respiratory failure ([place]) 04/15/2015   CAD in native artery 08/22/2018   CAP (community acquired pneumonia) 04/13/2015   See cxr 04/13/2015 > admit [facility]     Complication of anesthesia    H/O hiatal hernia    Hyperlipidemia LDL goal <70 08/22/2018   Hypertension 08/22/2018   Hypothyroidism 04/13/2015   Myasthenia gravis ([place]) 04/13/2015   Obesity 04/13/2015   Paroxysmal A-fib ([place]) 04/16/2015   S/P angioplasty with stent 08/21/18 DES to RCA 08/22/2018   Sepsis ([place]) 04/15/2015    Past Surgical History:  Procedure Laterality Date   AMPUTATION  06/11/2012   Procedure: AMPUTATION DIGIT;  Surgeon: [name], MD;  Location: [facility];  Service: Orthopedics;  Laterality: Left;  revision of amputation   CARDIOVERSION N/A 03/24/2020   Procedure: CARDIOVERSION;  Surgeon: [name], [name], MD;  Location: [facility] ENDOSCOPY;  Service: Cardiovascular;  Laterality: N/A;   CORONARY STENT INTERVENTION N/A 08/21/2018   Procedure: CORONARY  STENT INTERVENTION;  Surgeon: [name], MD;  Location: [facility] CV LAB;  Service: Cardiovascular;  Laterality: N/A;   HERNIA REPAIR  1994   LEFT HEART CATH AND CORONARY ANGIOGRAPHY N/A 08/21/2018   Procedure: LEFT HEART CATH AND CORONARY ANGIOGRAPHY;  Surgeon: [name], MD;  Location: [facility] CV LAB;  Service: Cardiovascular;  Laterality: N/A;   ORIF SHOULDER FRACTURE  06/13/2012   Procedure: OPEN REDUCTION INTERNAL FIXATION (ORIF) SHOULDER FRACTURE;  Surgeon: [name], MD;  Location: [facility];  Service:  Orthopedics;  Laterality: Left;  LEFT SHOULDER OPEN GREATER TUBEROSITY ORIF   SHOULDER CLOSED REDUCTION  06/11/2012   Procedure: CLOSED MANIPULATION SHOULDER;  Surgeon: [name], MD;  Location: [facility];  Service: Orthopedics;  Laterality: Left;     Current Outpatient Medications  Medication Sig Dispense Refill   amLODipine (NORVASC) 5 MG tablet Take 1 tablet (5 mg total) by mouth daily. 30 tablet 5   Ascorbic Acid (VITAMIN C) 1000 MG tablet Take 1,000 mg by mouth daily.     cholecalciferol (VITAMIN D3) 25 MCG (1000 UNIT) tablet Take 1,000 Units by mouth daily.     furosemide (LASIX) 20 MG tablet Take 1 tablet (20 mg total) by mouth daily. 90 tablet 3   levothyroxine (SYNTHROID) 100 MCG tablet Take 100 mcg by mouth daily before breakfast.     nitroGLYCERIN (NITROSTAT) 0.4 MG SL tablet Place 1 tablet (0.4 mg total) under the tongue every 5 (five) minutes x 3 doses as needed for chest pain. 25 tablet 4   Rivaroxaban (XARELTO) 15 MG TABS tablet Take 1 tablet (15 mg total) by mouth daily with supper. 30 tablet 5   sodium chloride (MURO 128) 5 % ophthalmic ointment Place 1 drop into the right eye in the morning, at noon, in the evening, and at bedtime.     No current facility-administered medications for this visit.    Allergies:   Beta adrenergic blockers and Statins    Social History:  The patient  reports that he quit smoking about 38 years ago. His smoking use included cigarettes. He has a 30.00 pack-year smoking history. He has never used smokeless tobacco. He reports current alcohol use of about 2.0 standard drinks of alcohol per week. He reports that he does not use drugs.   Family History:  The patient's family history includes Healthy in his daughter, sister, and son; Heart attack in his father; Other in his mother.    ROS:  Please see the history of present illness.   Otherwise, review of systems are positive for none.   All other systems are reviewed and negative.     PHYSICAL EXAM: VS:  BP (!) 168/84    Pulse 71    Ht 5' 8"  (1.727 m)    Wt 159 lb 6.4 oz (72.3 kg)    SpO2 100%    BMI 24.24 kg/m  , BMI Body mass index is 24.24 kg/m. GEN: Well nourished, well developed, in no acute distress  HEENT: normal  Neck: no JVD, carotid bruits, or masses Cardiac: RRR; gr 1/6 systolic murmur RUSB, rubs, or gallops,no edema  Respiratory:  clear to auscultation bilaterally, normal work of breathing GI: soft, nontender, nondistended, + BS MS: no deformity or atrophy. Pedal pulses are excellent. Skin: warm and dry, no rash Neuro:  Strength and sensation are intact Psych: euthymic mood, full affect   EKG:  EKG is ordered today. It  shows NSR rate 69. LAFB, RBBB. I  have personally reviewed and interpreted this study.     Lab Results  Component Value Date   WBC 6.2 03/01/2020   HGB 11.1 (L) 03/01/2020   HCT 34.6 (L) 03/01/2020   PLT 120 (L) 03/01/2020   GLUCOSE 90 03/15/2020   CHOL 128 06/05/2019   TRIG 109 06/05/2019   HDL 41 06/05/2019   LDLCALC 67 06/05/2019   ALT 9 06/05/2019   AST 16 06/05/2019   NA 138 03/15/2020   K 5.4 (H) 03/15/2020   CL 105 03/15/2020   CREATININE 1.25 03/15/2020   BUN 19 03/15/2020   CO2 21 03/15/2020   TSH 1.28 10/03/2016   Dated 01/12/20: cholesterol 135, triglycerides 93, HDL 36, LDL 80.TSH normal.    Wt Readings from Last 3 Encounters:  03/29/20 159 lb 6.4 oz (72.3 kg)  03/04/20 164 lb 9.6 oz (74.7 kg)  03/01/20 165 lb (74.8 kg)      Other studies Reviewed: Additional studies/ records that were reviewed today include:   Echo 10/22/16: Study Conclusions  - Left ventricle: The cavity size was normal. There was moderate   concentric hypertrophy. Systolic function was normal. The   estimated ejection fraction was in the range of 55% to 60%. Wall   motion was normal; there were no regional wall motion   abnormalities. Features are consistent with a pseudonormal left   ventricular filling pattern, with  concomitant abnormal relaxation   and increased filling pressure (grade 2 diastolic dysfunction). - Aortic valve: There was mild regurgitation. Valve area (VTI):   2.17 cm^2. - Mitral valve: Calcified annulus. There was mild regurgitation. - Left atrium: The atrium was moderately dilated. - Pulmonary arteries: PA peak pressure: 32 mm Hg (S).   Echo 08/21/18: Study Conclusions  - Left ventricle: The cavity size was normal. Wall thickness was   increased in a pattern of mild LVH. Mid to apical inferior   hypokinesis. Hypokinesis of the apex. The estimated ejection   fraction was 45%. Features are consistent with a pseudonormal   left ventricular filling pattern, with concomitant abnormal   relaxation and increased filling pressure (grade 2 diastolic   dysfunction). - Aortic valve: Trileaflet; moderately calcified leaflets. There   was mild stenosis. There was moderate eccentric regurgitation.   Mean gradient (S): 10 mm Hg. Valve area (VTI): 1.73 cm^2. - Mitral valve: Moderately calcified annulus. Mildly calcified   leaflets. There is some degree of restriction of the posterior   leaflet. There was moderate to severe regurgitation. - Left atrium: The atrium was moderately dilated. - Right ventricle: The cavity size was normal. Systolic function   was normal. - Right atrium: The atrium was moderately to severely dilated. - Tricuspid valve: There was moderate regurgitation. Peak RV-RA   gradient 39 mmHg. - Pulmonary arteries: PA peak pressure: 47 mm Hg (S). - Systemic veins: IVC measured 2.5 cm with > 50% respirophasic   variation, suggesting RA pressure 8 mmHg.  Impressions:  - Normal LV size with mild LV hypertrophy. EF 45% with mid to   apical inferior hypokinesis and hypokinesis of the apex. Moderate   diastolic dysfunction. Normal RV size and systolic function.   Biatrial enlargement. Moderate to severe mitral regurgitation   with restriction of the posterior leaflet. Mild  aortic stenosis   with moderate eccentric regurgitation. Mild pulmonary   hypertension.   Cardiac cath/PCI: 08/21/18: CORONARY STENT INTERVENTION  LEFT HEART CATH AND CORONARY ANGIOGRAPHY  Conclusion     Prox RCA lesion is 95% stenosed.  Ost Cx lesion is 60% stenosed.  Prox Cx lesion is 60% stenosed.  Prox LAD lesion is 100% stenosed.  A drug-eluting stent was successfully placed.  Post intervention, there is a 0% residual stenosis.  There is moderate left ventricular systolic dysfunction.  LV end diastolic pressure is mildly elevated.  The left ventricular ejection fraction is 35-45% by visual estimate.   Tevan ARUL FARABEE Sr. is a 86 y.o. male    [id number] LOCATION:  FACILITY: [facility]  PHYSICIAN: Quay Burow, M.D. [birth year]   DATE OF PROCEDURE:  08/21/2018  DATE OF DISCHARGE:     CARDIAC CATHETERIZATION / PCI DES RCA    History obtained from chart review.Mr. Trickett is an 55M with paroxysmal atrial fibrillation,myastheniagravis, hyperlipidemia and thrombocytopenia here with NSTEMI.    PROCEDURE DESCRIPTION:   The patient was brought to the second floor [facility] cath lab in the postabsorptive state. He was premedicated with IV Versed and fentanyl. His right wristwas prepped and shaved in usual sterile fashion. Xylocaine 1% was used for local anesthesia. A 6 French sheath was inserted into the right radial artery using standard Seldinger technique. The patient received 4000units  of heparin intravenously.  A 5 [place] TIG catheter and pigtail catheters were used for selective coronary angiography and left ventriculography respectively.  Isovue dye was used for the entirety of the case.  Retrograde aorta, left ventricular and pullback pressures were recorded.  Radial cocktail was administered via the SideArm sheath.  Patient received Brilinta 180 mg p.o. followed by Angiomax bolus and infusion with a therapeutic ACT demonstrated.   Using a 6 [place] JR4 guide catheter, 0.14 pro-water guidewire and a 2 mm x 12 mm balloon predilatation was performed to the proximal dominant RCA.  I then placed a 3 mm x 60 mm long Synergy drug-eluting stent across the diseased segment and deployed at 14 to 16 atm.  I then postdilated with a 3.25 x 12 mm long noncompliant balloon up to 16 atm (3.3 mm) resulting reduction with 95% ulcerated proximal dominant RCA plaque to 0% residual.  There was distal spasm resulting in ST segment elevation which resolved with administration of intracoronary nitroglycerin.  I then attempted to cross the mid LAD CTO using a 6 [place] XB LAD 4 cm curve guide catheter, pro-water and whisper wire and 1.5 mm balloon.  I did ultimately cross the lesion but I did not think I was intraluminal and ultimately aborted the procedure.   IMPRESSION: Successful proximal RCA PCI and drug-eluting stenting using a synergy drug-eluting stent postdilated to 3.3 mm with unsuccessful attempt at crossing mid LAD CTO.  His LV function is moderately reduced in the 40% range.  His LAD CTO can be treated in a staged fashion electively.  The sheath was removed and a TR band was placed on the right wrist to achieve patent hemostasis.  The patient left the lab in stable condition.  Quay Burow. MD, [facility] 08/21/2018 5:20 PM     Recommend uninterrupted dual antiplatelet therapy with Aspirin 47m daily and Ticagrelor 952mtwice daily for a minimum of 12 months (ACS - Class I recommendation).   Echo 03/07/20: IMPRESSIONS    1. Left ventricular ejection fraction, by estimation, is 60 to 65%. The  left ventricle has normal function. The left ventricle has no regional  wall motion abnormalities. Left ventricular diastolic function could not  be evaluated.  2. Right ventricular systolic function is normal. The right ventricular  size is normal. There is mildly elevated pulmonary  artery systolic  pressure.  3. Left atrial size was  severely dilated.  4. Right atrial size was severely dilated.  5. The mitral valve is normal in structure. Mild mitral valve  regurgitation. No evidence of mitral stenosis.  6. Tricuspid valve regurgitation is moderate.  7. The aortic valve is grossly normal. Aortic valve regurgitation is  mild. No aortic stenosis is present.  8. Aortic dilatation noted. There is mild dilatation of the ascending  aorta measuring 39 mm.   ASSESSMENT AND PLAN:  1.  CAD with s/p NSTEMI in November 2019 secondary to ruptured plaque lesion in the RCA. S/p DES. He has CTO of the mid LAD but has been asymptomatic. Given lack of symptoms or LV dysfunction in the anterior wall we have not recommended CTO PCI of the LAD. Off ASA due to need to go on anticoagulation.  Intolerant of Brilinta in the past.   2. Hyperlipidemia. LDL now 67 at goal on dietary therapy. Given prior reaction apparently to statins will focus on dietary modification for now.   3. LV dysfunction acutely at time of MI. EF 45%. Off ARB due to elevated potassium.   Euvolemic. No beta blocker due to history of bradycardia. Repeat recent Echo showed normal EF.   4. HTN. Elevated. On amlodipine 5 mg daily and lasix  5. Myastenia gravis in remission  6. Hypothyroidism.   7. Atrial flutter new onset. Rate controlled with 4:1 conduction. On no AV nodal blocking agents. He was significantly symptomatic. Now on  Xarelto 15 mg daily. No ASA or NSAIDs. S/p successful DCCV on 03/24/20. We discussed staying on Xarelto without interruption for 4 weeks. Is planning to have corneal transplant in one month at [facility]. Can hold for 48 hours. We discussed that he is at higher risk for recurrent Atrial flutter due to biatrial enlargement. If so will refer to EP for consideration of AAD therapy or ablation. I am afraid with his slow ventricular response with flutter if we do use an AAD he may require a pacemaker.    Current medicines are reviewed at length with the  patient today.  The patient does not have concerns regarding medicines.  The following changes have been made:  no change  Follow up in 3 months.  Signed, Shiro Ellerman Martinique, MD  03/29/2020 5:17 PM    [facility] [address], [place], [place], [address] Phone [phone], Fax [phone]

## 2020-03-26 ENCOUNTER — Other Ambulatory Visit: Payer: Self-pay | Admitting: Cardiology

## 2020-03-26 MED ORDER — FUROSEMIDE 20 MG PO TABS
20.0000 mg | ORAL_TABLET | Freq: Every day | ORAL | 3 refills | Status: DC
Start: 2020-03-26 — End: 2020-12-08

## 2020-03-29 ENCOUNTER — Encounter: Payer: Self-pay | Admitting: Cardiology

## 2020-03-29 ENCOUNTER — Ambulatory Visit (INDEPENDENT_AMBULATORY_CARE_PROVIDER_SITE_OTHER): Payer: Medicare Other | Admitting: Cardiology

## 2020-03-29 ENCOUNTER — Other Ambulatory Visit: Payer: Self-pay

## 2020-03-29 VITALS — BP 168/84 | HR 71 | Ht 68.0 in | Wt 159.4 lb

## 2020-03-29 DIAGNOSIS — I5022 Chronic systolic (congestive) heart failure: Secondary | ICD-10-CM

## 2020-03-29 DIAGNOSIS — I10 Essential (primary) hypertension: Secondary | ICD-10-CM

## 2020-03-29 DIAGNOSIS — I2510 Atherosclerotic heart disease of native coronary artery without angina pectoris: Secondary | ICD-10-CM

## 2020-03-29 DIAGNOSIS — I4892 Unspecified atrial flutter: Secondary | ICD-10-CM | POA: Diagnosis not present

## 2020-03-29 NOTE — Patient Instructions (Signed)
Medication Instructions:  Continue same medications *If you need a refill on your cardiac medications before your next appointment, please call your pharmacy*   Lab Work: [facility] next week  Testing/Procedures: None ordered   Follow-Up: At [facility], you and your health needs are our priority.  As part of our continuing mission to provide you with exceptional heart care, we have created designated Provider Care Teams.  These Care Teams include your primary Cardiologist (physician) and Advanced Practice Providers (APPs -  Physician Assistants and Nurse Practitioners) who all work together to provide you with the care you need, when you need it.  We recommend signing up for the patient portal called "MyChart".  Sign up information is provided on this After Visit Summary.  MyChart is used to connect with patients for Virtual Visits (Telemedicine).  Patients are able to view lab/test results, encounter notes, upcoming appointments, etc.  Non-urgent messages can be sent to your provider as well.   To learn more about what you can do with MyChart, go to [email or web address].    Your next appointment:  Tuesday 06/07/20 at 11:40 am   The format for your next appointment:Office    Provider: Dr.[name]

## 2020-04-07 LAB — BASIC METABOLIC PANEL
BUN/Creatinine Ratio: 16 (ref 10–24)
BUN: 20 mg/dL (ref 8–27)
CO2: 23 mmol/L (ref 20–29)
Calcium: 9 mg/dL (ref 8.6–10.2)
Chloride: 101 mmol/L (ref 96–106)
Creatinine, Ser: 1.28 mg/dL — ABNORMAL HIGH (ref 0.76–1.27)
GFR calc Af Amer: 59 mL/min/{1.73_m2} — ABNORMAL LOW (ref >59)
GFR calc non Af Amer: 51 mL/min/{1.73_m2} — ABNORMAL LOW (ref >59)
Glucose: 132 mg/dL — ABNORMAL HIGH (ref 65–99)
Potassium: 4.6 mmol/L (ref 3.5–5.2)
Sodium: 134 mmol/L (ref 134–144)

## 2020-04-13 DIAGNOSIS — H02134 Senile ectropion of left upper eyelid: Secondary | ICD-10-CM | POA: Diagnosis not present

## 2020-04-13 DIAGNOSIS — H0102B Squamous blepharitis left eye, upper and lower eyelids: Secondary | ICD-10-CM | POA: Diagnosis not present

## 2020-04-13 DIAGNOSIS — H18231 Secondary corneal edema, right eye: Secondary | ICD-10-CM | POA: Diagnosis not present

## 2020-04-13 DIAGNOSIS — H0102A Squamous blepharitis right eye, upper and lower eyelids: Secondary | ICD-10-CM | POA: Diagnosis not present

## 2020-04-13 DIAGNOSIS — B0052 Herpesviral keratitis: Secondary | ICD-10-CM | POA: Diagnosis not present

## 2020-04-13 DIAGNOSIS — Z961 Presence of intraocular lens: Secondary | ICD-10-CM | POA: Diagnosis not present

## 2020-04-13 DIAGNOSIS — H02131 Senile ectropion of right upper eyelid: Secondary | ICD-10-CM | POA: Diagnosis not present

## 2020-04-15 NOTE — Telephone Encounter (Signed)
He should not stop Xarelto until after July 24 (4 weeks post cardioversion). After that I think it would be safe to interrupt it for 2 days for procedure  Jemel Ono Martinique MD, [facility]

## 2020-04-18 DIAGNOSIS — H18231 Secondary corneal edema, right eye: Secondary | ICD-10-CM | POA: Insufficient documentation

## 2020-04-21 DIAGNOSIS — H04123 Dry eye syndrome of bilateral lacrimal glands: Secondary | ICD-10-CM | POA: Diagnosis not present

## 2020-04-21 DIAGNOSIS — H18231 Secondary corneal edema, right eye: Secondary | ICD-10-CM | POA: Diagnosis not present

## 2020-04-27 ENCOUNTER — Other Ambulatory Visit: Payer: Self-pay

## 2020-04-27 ENCOUNTER — Encounter: Payer: Self-pay | Admitting: Podiatry

## 2020-04-27 ENCOUNTER — Ambulatory Visit (INDEPENDENT_AMBULATORY_CARE_PROVIDER_SITE_OTHER): Payer: Medicare Other | Admitting: Podiatry

## 2020-04-27 DIAGNOSIS — M79674 Pain in right toe(s): Secondary | ICD-10-CM

## 2020-04-27 DIAGNOSIS — M79675 Pain in left toe(s): Secondary | ICD-10-CM

## 2020-04-27 DIAGNOSIS — B351 Tinea unguium: Secondary | ICD-10-CM

## 2020-04-28 DIAGNOSIS — H6123 Impacted cerumen, bilateral: Secondary | ICD-10-CM | POA: Diagnosis not present

## 2020-04-30 NOTE — Progress Notes (Signed)
Subjective: Marcus A Kimble Sr. presents today for follow up of painful mycotic nails b/l that are difficult to trim. Pain interferes with ambulation. Aggravating factors include wearing enclosed shoe gear. Pain relieved with periodic professional debridement. Patient states he received topical antifungal from [facility].  He has brought the medication into the office with him on today's visit.  He would like instructions as to how to correctly apply the medication to his toenails.  Past Medical History:  Diagnosis Date  . Acute on chronic combined systolic and diastolic CHF (congestive heart failure) ([place]) 08/22/2018  . Acute on chronic respiratory failure ([place]) 04/15/2015  . CAD in native artery 08/22/2018  . CAP (community acquired pneumonia) 04/13/2015   See cxr 04/13/2015 > admit [facility]    . Complication of anesthesia   . H/O hiatal hernia   . Hyperlipidemia LDL goal <70 08/22/2018  . Hypertension 08/22/2018  . Hypothyroidism 04/13/2015  . Myasthenia gravis ([place]) 04/13/2015  . Obesity 04/13/2015  . Paroxysmal A-fib ([place]) 04/16/2015  . S/P angioplasty with stent 08/21/18 DES to RCA 08/22/2018  . Sepsis ([place]) 04/15/2015     Patient Active Problem List   Diagnosis Date Noted  . Persistent atrial fibrillation ([place])   . Chronic systolic CHF (congestive heart failure) ([place]) 09/29/2018  . Acute on chronic combined systolic and diastolic CHF (congestive heart failure) ([place]) 08/22/2018  . Hypertension 08/22/2018  . Hypercholesterolemia 08/22/2018  . CAD in native artery, residual disease in LCX and LAD - 08/22/2018  . S/P angioplasty with stent 08/21/18 DES to RCA 08/22/2018  . Chronic a-fib ([place]) 08/20/2018  . Chest pain, atypical 08/20/2018  . Chest pain 08/20/2018  . Non-ST elevation (NSTEMI) myocardial infarction ([place])   . Nonrheumatic mitral valve regurgitation   . Acute on chronic heart failure ([place])   . Thrombocytopenia ([place]) 10/07/2016  . Dyspnea on exertion 10/03/2016  .  B12 deficiency 06/06/2016  . Acute asthmatic bronchitis 12/15/2015  . PAF (paroxysmal atrial fibrillation) ([place]) 04/16/2015  . Influenza A 04/15/2015  . Sepsis ([place]) 04/15/2015  . Acute renal failure ([place]) 04/15/2015  . CAP (community acquired pneumonia) 04/13/2015  . Obesity 04/13/2015  . Myasthenia gravis ([place]) 04/13/2015  . Hypothyroidism 04/13/2015     Past Surgical History:  Procedure Laterality Date  . AMPUTATION  06/11/2012   Procedure: AMPUTATION DIGIT;  Surgeon: [name], MD;  Location: [facility];  Service: Orthopedics;  Laterality: Left;  revision of amputation  . CARDIOVERSION N/A 03/24/2020   Procedure: CARDIOVERSION;  Surgeon: [name], [name], MD;  Location: [facility] ENDOSCOPY;  Service: Cardiovascular;  Laterality: N/A;  . CORONARY STENT INTERVENTION N/A 08/21/2018   Procedure: CORONARY STENT INTERVENTION;  Surgeon: [name], MD;  Location: [facility] CV LAB;  Service: Cardiovascular;  Laterality: N/A;  . HERNIA REPAIR  1994  . LEFT HEART CATH AND CORONARY ANGIOGRAPHY N/A 08/21/2018   Procedure: LEFT HEART CATH AND CORONARY ANGIOGRAPHY;  Surgeon: [name], MD;  Location: [facility] CV LAB;  Service: Cardiovascular;  Laterality: N/A;  . ORIF SHOULDER FRACTURE  06/13/2012   Procedure: OPEN REDUCTION INTERNAL FIXATION (ORIF) SHOULDER FRACTURE;  Surgeon: [name], MD;  Location: [facility];  Service: Orthopedics;  Laterality: Left;  LEFT SHOULDER OPEN GREATER TUBEROSITY ORIF  . SHOULDER CLOSED REDUCTION  06/11/2012   Procedure: CLOSED MANIPULATION SHOULDER;  Surgeon: [name], MD;  Location: [facility];  Service: Orthopedics;  Laterality: Left;     Current Outpatient Medications on File Prior to Visit  Medication Sig Dispense Refill  . amLODipine (NORVASC) 5 MG tablet Take 1 tablet (5 mg total) by mouth daily. 30 tablet 5  . Ascorbic Acid (VITAMIN C) 1000 MG tablet Take 1,000 mg by mouth daily.    . cholecalciferol (VITAMIN D3) 25 MCG (1000 UNIT) tablet Take  1,000 Units by mouth daily.    . furosemide (LASIX) 20 MG tablet Take 1 tablet (20 mg total) by mouth daily. 90 tablet 3  . levothyroxine (SYNTHROID) 100 MCG tablet Take 100 mcg by mouth daily before breakfast.    . nitroGLYCERIN (NITROSTAT) 0.4 MG SL tablet Place 1 tablet (0.4 mg total) under the tongue every 5 (five) minutes x 3 doses as needed for chest pain. 25 tablet 4  . Rivaroxaban (XARELTO) 15 MG TABS tablet Take 1 tablet (15 mg total) by mouth daily with supper. 30 tablet 5  . sodium chloride (MURO 128) 5 % ophthalmic ointment Place 1 drop into the right eye in the morning, at noon, in the evening, and at bedtime.     No current facility-administered medications on file prior to visit.     Allergies  Allergen Reactions  . Beta Adrenergic Blockers Hives  . Statins Hives     Social History   Occupational History  . Not on file  Tobacco Use  . Smoking status: Former Smoker    Packs/day: 1.00    Years: 30.00    Pack years: 30.00    Types: Cigarettes    Quit date: 01/10/1982    Years since quitting: 38.3  . Smokeless tobacco: Never Used  Vaping Use  . Vaping Use: Never used  Substance and Sexual Activity  . Alcohol use: Yes    Alcohol/week: 2.0 standard drinks    Types: 2 Glasses of wine per week    Comment: 3-4 per week   . Drug use: No  . Sexual activity: Never     Family History  Problem Relation Age of Onset  . Other Mother        Deceased, 48  . Heart attack Father        Deceased, 57  . Healthy Sister   . Healthy Daughter        x 4  . Healthy Son        x 3     Immunization History  Administered Date(s) Administered  . PFIZER SARS-COV-2 Vaccination 10/23/2019, 11/13/2019  . Pneumococcal Conjugate-13 04/21/2014  . Pneumococcal Polysaccharide-23 07/08/2016  . Zoster 07/03/2015     Objective: There were no vitals filed for this visit.  Pt is a pleasant 86 y.o. Caucasian male, WD, WN  in NAD.  AAO x 3.   Vascular Examination:  Capillary refill  time to digits immediate b/l. Palpable DP pulses b/l. Palpable PT pulses b/l. Pedal hair sparse b/l. Skin temperature gradient within normal limits b/l. No edema noted b/l. No pain with calf compression b/l.  Dermatological Examination: Pedal skin with normal turgor, texture and tone bilaterally. No open wounds bilaterally. No interdigital macerations bilaterally. Toenails 1-5 b/l elongated, dystrophic, thickened, crumbly with subungual debris and tenderness to dorsal palpation.  Musculoskeletal: Normal muscle strength 5/5 to all lower extremity muscle groups bilaterally. No pain crepitus or joint limitation noted with ROM b/l. Hallux valgus with bunion deformity noted b/l.  Neurological: Protective sensation intact 5/5 intact bilaterally with 10g monofilament b/l. Vibratory sensation intact b/l. Proprioception intact bilaterally. Babinski reflex negative b/l. Clonus negative b/l.  Assessment: 1. Pain due to onychomycosis of toenails  of both feet     Plan: -Instructed patient visually as to how to correctly apply his topical antifungal medication on today's visit.  Patient related understanding. -Toenails 1-5 b/l were debrided in length and girth with sterile nail nippers and dremel without iatrogenic bleeding.  -Patient to continue soft, supportive shoe gear daily. -Patient to report any pedal injuries to medical professional immediately. -Patient/POA to call should there be question/concern in the interim.  Return in about 3 months (around 07/28/2020).

## 2020-05-11 DIAGNOSIS — C44319 Basal cell carcinoma of skin of other parts of face: Secondary | ICD-10-CM | POA: Diagnosis not present

## 2020-05-11 DIAGNOSIS — L570 Actinic keratosis: Secondary | ICD-10-CM | POA: Diagnosis not present

## 2020-05-11 DIAGNOSIS — Z85828 Personal history of other malignant neoplasm of skin: Secondary | ICD-10-CM | POA: Diagnosis not present

## 2020-05-11 DIAGNOSIS — C44311 Basal cell carcinoma of skin of nose: Secondary | ICD-10-CM | POA: Diagnosis not present

## 2020-05-11 DIAGNOSIS — L814 Other melanin hyperpigmentation: Secondary | ICD-10-CM | POA: Diagnosis not present

## 2020-05-11 DIAGNOSIS — L821 Other seborrheic keratosis: Secondary | ICD-10-CM | POA: Diagnosis not present

## 2020-06-01 DIAGNOSIS — H02134 Senile ectropion of left upper eyelid: Secondary | ICD-10-CM | POA: Diagnosis not present

## 2020-06-01 DIAGNOSIS — H18231 Secondary corneal edema, right eye: Secondary | ICD-10-CM | POA: Diagnosis not present

## 2020-06-01 DIAGNOSIS — Z961 Presence of intraocular lens: Secondary | ICD-10-CM | POA: Diagnosis not present

## 2020-06-01 DIAGNOSIS — B0052 Herpesviral keratitis: Secondary | ICD-10-CM | POA: Diagnosis not present

## 2020-06-01 DIAGNOSIS — H01003 Unspecified blepharitis right eye, unspecified eyelid: Secondary | ICD-10-CM | POA: Diagnosis not present

## 2020-06-01 DIAGNOSIS — H01006 Unspecified blepharitis left eye, unspecified eyelid: Secondary | ICD-10-CM | POA: Diagnosis not present

## 2020-06-01 DIAGNOSIS — H02131 Senile ectropion of right upper eyelid: Secondary | ICD-10-CM | POA: Diagnosis not present

## 2020-06-02 DIAGNOSIS — I252 Old myocardial infarction: Secondary | ICD-10-CM | POA: Diagnosis not present

## 2020-06-02 DIAGNOSIS — Z79899 Other long term (current) drug therapy: Secondary | ICD-10-CM | POA: Diagnosis not present

## 2020-06-02 DIAGNOSIS — I4891 Unspecified atrial fibrillation: Secondary | ICD-10-CM | POA: Diagnosis not present

## 2020-06-02 DIAGNOSIS — I110 Hypertensive heart disease with heart failure: Secondary | ICD-10-CM | POA: Diagnosis not present

## 2020-06-02 DIAGNOSIS — H1811 Bullous keratopathy, right eye: Secondary | ICD-10-CM | POA: Diagnosis not present

## 2020-06-02 DIAGNOSIS — Z87891 Personal history of nicotine dependence: Secondary | ICD-10-CM | POA: Diagnosis not present

## 2020-06-02 DIAGNOSIS — I10 Essential (primary) hypertension: Secondary | ICD-10-CM | POA: Diagnosis not present

## 2020-06-02 DIAGNOSIS — Z7901 Long term (current) use of anticoagulants: Secondary | ICD-10-CM | POA: Diagnosis not present

## 2020-06-02 DIAGNOSIS — I272 Pulmonary hypertension, unspecified: Secondary | ICD-10-CM | POA: Diagnosis not present

## 2020-06-02 DIAGNOSIS — G7000 Myasthenia gravis without (acute) exacerbation: Secondary | ICD-10-CM | POA: Diagnosis not present

## 2020-06-02 DIAGNOSIS — E039 Hypothyroidism, unspecified: Secondary | ICD-10-CM | POA: Diagnosis not present

## 2020-06-02 DIAGNOSIS — H18511 Endothelial corneal dystrophy, right eye: Secondary | ICD-10-CM | POA: Diagnosis not present

## 2020-06-03 DIAGNOSIS — Z48810 Encounter for surgical aftercare following surgery on the sense organs: Secondary | ICD-10-CM | POA: Diagnosis not present

## 2020-06-03 DIAGNOSIS — Z961 Presence of intraocular lens: Secondary | ICD-10-CM | POA: Diagnosis not present

## 2020-06-03 DIAGNOSIS — Z9841 Cataract extraction status, right eye: Secondary | ICD-10-CM | POA: Diagnosis not present

## 2020-06-03 DIAGNOSIS — B0052 Herpesviral keratitis: Secondary | ICD-10-CM | POA: Diagnosis not present

## 2020-06-03 DIAGNOSIS — H0102A Squamous blepharitis right eye, upper and lower eyelids: Secondary | ICD-10-CM | POA: Diagnosis not present

## 2020-06-03 DIAGNOSIS — Z947 Corneal transplant status: Secondary | ICD-10-CM | POA: Diagnosis not present

## 2020-06-03 DIAGNOSIS — H02134 Senile ectropion of left upper eyelid: Secondary | ICD-10-CM | POA: Diagnosis not present

## 2020-06-03 DIAGNOSIS — H02131 Senile ectropion of right upper eyelid: Secondary | ICD-10-CM | POA: Diagnosis not present

## 2020-06-03 DIAGNOSIS — H0102B Squamous blepharitis left eye, upper and lower eyelids: Secondary | ICD-10-CM | POA: Diagnosis not present

## 2020-06-03 DIAGNOSIS — Z9842 Cataract extraction status, left eye: Secondary | ICD-10-CM | POA: Diagnosis not present

## 2020-06-07 ENCOUNTER — Ambulatory Visit: Payer: Medicare Other | Admitting: Cardiovascular Disease

## 2020-06-07 DIAGNOSIS — Z961 Presence of intraocular lens: Secondary | ICD-10-CM | POA: Diagnosis not present

## 2020-06-07 DIAGNOSIS — H02131 Senile ectropion of right upper eyelid: Secondary | ICD-10-CM | POA: Diagnosis not present

## 2020-06-07 DIAGNOSIS — H0102B Squamous blepharitis left eye, upper and lower eyelids: Secondary | ICD-10-CM | POA: Diagnosis not present

## 2020-06-07 DIAGNOSIS — Z947 Corneal transplant status: Secondary | ICD-10-CM | POA: Diagnosis not present

## 2020-06-07 DIAGNOSIS — Z9841 Cataract extraction status, right eye: Secondary | ICD-10-CM | POA: Diagnosis not present

## 2020-06-07 DIAGNOSIS — Z9842 Cataract extraction status, left eye: Secondary | ICD-10-CM | POA: Diagnosis not present

## 2020-06-07 DIAGNOSIS — Z48810 Encounter for surgical aftercare following surgery on the sense organs: Secondary | ICD-10-CM | POA: Diagnosis not present

## 2020-06-07 DIAGNOSIS — H0102A Squamous blepharitis right eye, upper and lower eyelids: Secondary | ICD-10-CM | POA: Diagnosis not present

## 2020-06-07 DIAGNOSIS — B0052 Herpesviral keratitis: Secondary | ICD-10-CM | POA: Diagnosis not present

## 2020-06-07 DIAGNOSIS — H02134 Senile ectropion of left upper eyelid: Secondary | ICD-10-CM | POA: Diagnosis not present

## 2020-06-07 DIAGNOSIS — H18231 Secondary corneal edema, right eye: Secondary | ICD-10-CM | POA: Diagnosis not present

## 2020-06-27 DIAGNOSIS — Z23 Encounter for immunization: Secondary | ICD-10-CM | POA: Diagnosis not present

## 2020-07-05 DIAGNOSIS — Z85828 Personal history of other malignant neoplasm of skin: Secondary | ICD-10-CM | POA: Diagnosis not present

## 2020-07-05 DIAGNOSIS — C44311 Basal cell carcinoma of skin of nose: Secondary | ICD-10-CM | POA: Diagnosis not present

## 2020-09-03 NOTE — Progress Notes (Signed)
Cardiology Office Note    Date:  09/08/2020   ID:  Marcus [name] Sr., DOB 24-Nov-1931, MRN [id number]  PCP:  [name], MD  Cardiologist:   Tia Gelb [place], MD   Chief Complaint  Patient presents with  . Atrial Flutter      History of Present Illness: Marcus A Decarlo Sr. is a 86 y.o. male who is seen for follow up CAD. He has a history of myasthenia gravis and remote PAF in the setting of pneumonia July 2016.  He presented 08/20/2018 with a non-ST elevation MI.  08/21/2018 he underwent diagnostic catheterization and intervention with a DES to his RCA.   Patient did have a residual total LAD that Dr [name] attempted to cross but it appeared the wire got subintimal and so the procedure was aborted. He also has a 60% circumflex.  Echocardiogram showed an EF of 40% with moderate to severe MR.  Though his LVEDP at cath was normal he was felt to be volume overloaded.  He was diuresed and sent home on a diuretic as well.    On initial follow up he complained of persistent SOB. Was seen by Dr. Melvyn Novas who felt this was not a pulmonary issue. Later switched from Brilinta to Plavix with significant improvement in symptoms. He was considered for potential CTO PCI of the LAD but since he was asymptomatic this was not recommended.   He was seen recently in the ED on 03/01/20 after experiencing an episode of dyspnea and left arm pain while walking to his mailbox. He states prior to this he was very inactive and decided to start walking. He walked 30 minutes a day for 4 days before this occurred. In the ED he was found to be in atrial flutter with 4:1 conduction and controlled rate. He was severely hypertensive. Potassium was elevated at 5.8. troponin was 15>18. CXR NAD. Admission was considered but patient refused. Losartan was stopped and he was started on amlodipine. Also placed on Xarelto. Repeat Echo done and showed improvement in LV function with normal EF. Only mild MR. Severe biatrial enlargement.  After anticoagulation for 3-4 weeks he did undergo successful DCCV on 03/24/20. He was started on lasix 20 mg daily. Noted significant improvement in his edema. When seen on June 29 was maintaining NSR.  On follow up today he notes a localized pain over his left breast. Feels like he is being pinched with pliers. Initially worse with deep breath but this has resolved. He notes for the past 6 weeks that he has severe chills after lunch. He has to get in bed and bundle up with blankets. States he has been tested for Covid and is negative. Denies any fever, cough, dysuria, abdominal pain or skin infection. No change in medical history otherwise. Feels his breathing is good. No edema.   Past Medical History:  Diagnosis Date  . Acute on chronic combined systolic and diastolic CHF (congestive heart failure) ([place]) 08/22/2018  . Acute on chronic respiratory failure ([name]) 04/15/2015  . CAD in native artery 08/22/2018  . CAP (community acquired pneumonia) 04/13/2015   See cxr 04/13/2015 > admit [facility]h    . Complication of anesthesia   . H/O hiatal hernia   . Hyperlipidemia LDL goal <70 08/22/2018  . Hypertension 08/22/2018  . Hypothyroidism 04/13/2015  . Myasthenia gravis ([place]) 04/13/2015  . Obesity 04/13/2015  . Paroxysmal A-fib ([place]) 04/16/2015  . S/P angioplasty with stent 08/21/18 DES to RCA 08/22/2018  . Sepsis ([place]) 04/15/2015  Past Surgical History:  Procedure Laterality Date  . AMPUTATION  06/11/2012   Procedure: AMPUTATION DIGIT;  Surgeon: [name], MD;  Location: [facility];  Service: Orthopedics;  Laterality: Left;  revision of amputation  . CARDIOVERSION N/A 03/24/2020   Procedure: CARDIOVERSION;  Surgeon: [name], [name], MD;  Location: [facility] ENDOSCOPY;  Service: Cardiovascular;  Laterality: N/A;  . CORONARY STENT INTERVENTION N/A 08/21/2018   Procedure: CORONARY STENT INTERVENTION;  Surgeon: [name], MD;  Location: [facility] CV LAB;  Service: Cardiovascular;  Laterality: N/A;  .  HERNIA REPAIR  1994  . LEFT HEART CATH AND CORONARY ANGIOGRAPHY N/A 08/21/2018   Procedure: LEFT HEART CATH AND CORONARY ANGIOGRAPHY;  Surgeon: [name], MD;  Location: [facility] CV LAB;  Service: Cardiovascular;  Laterality: N/A;  . ORIF SHOULDER FRACTURE  06/13/2012   Procedure: OPEN REDUCTION INTERNAL FIXATION (ORIF) SHOULDER FRACTURE;  Surgeon: [name], MD;  Location: [facility];  Service: Orthopedics;  Laterality: Left;  LEFT SHOULDER OPEN GREATER TUBEROSITY ORIF  . SHOULDER CLOSED REDUCTION  06/11/2012   Procedure: CLOSED MANIPULATION SHOULDER;  Surgeon: [name], MD;  Location: [facility];  Service: Orthopedics;  Laterality: Left;     Current Outpatient Medications  Medication Sig Dispense Refill  . amLODipine (NORVASC) 5 MG tablet Take 1 tablet (5 mg total) by mouth daily. 30 tablet 5  . Ascorbic Acid (VITAMIN C) 1000 MG tablet Take 1,000 mg by mouth daily.    . cholecalciferol (VITAMIN D3) 25 MCG (1000 UNIT) tablet Take 1,000 Units by mouth daily.    . furosemide (LASIX) 20 MG tablet Take 1 tablet (20 mg total) by mouth daily. 90 tablet 3  . levothyroxine (SYNTHROID) 100 MCG tablet Take 100 mcg by mouth daily before breakfast.    . nitroGLYCERIN (NITROSTAT) 0.4 MG SL tablet Place 1 tablet (0.4 mg total) under the tongue every 5 (five) minutes x 3 doses as needed for chest pain. 25 tablet 4  . Rivaroxaban (XARELTO) 15 MG TABS tablet Take 1 tablet (15 mg total) by mouth daily with supper. 30 tablet 5  . sodium chloride (MURO 128) 5 % ophthalmic ointment Place 1 drop into the right eye in the morning, at noon, in the evening, and at bedtime.     No current facility-administered medications for this visit.    Allergies:   Beta adrenergic blockers and Statins    Social History:  The patient  reports that he quit smoking about 38 years ago. His smoking use included cigarettes. He has a 30.00 pack-year smoking history. He has never used smokeless tobacco. He reports current  alcohol use of about 2.0 standard drinks of alcohol per week. He reports that he does not use drugs.   Family History:  The patient's family history includes Healthy in his daughter, sister, and son; Heart attack in his father; Other in his mother.    ROS:  Please see the history of present illness.   Otherwise, review of systems are positive for none.   All other systems are reviewed and negative.    PHYSICAL EXAM: VS:  BP (!) 175/82   Pulse 68   Ht 5' 8"  (1.727 m)   Wt 156 lb 12.8 oz (71.1 kg)   SpO2 100%   BMI 23.84 kg/m  , BMI Body mass index is 23.84 kg/m. GEN: Well nourished, well developed, in no acute distress  HEENT: normal  Neck: no JVD, carotid bruits, or masses Cardiac: RRR; gr 1/6  systolic murmur RUSB, rubs, or gallops,no edema  Respiratory:  clear to auscultation bilaterally, normal work of breathing GI: soft, nontender, nondistended, + BS MS: no deformity or atrophy. Pedal pulses are excellent. Skin: warm and dry, no rash Neuro:  Strength and sensation are intact Psych: euthymic mood, full affect   EKG:  EKG is ordered today. It  shows atrial flutter with rate 67. LAFB, RBBB. I have personally reviewed and interpreted this study.     Lab Results  Component Value Date   WBC 6.2 03/01/2020   HGB 11.1 (L) 03/01/2020   HCT 34.6 (L) 03/01/2020   PLT 120 (L) 03/01/2020   GLUCOSE 132 (H) 04/07/2020   CHOL 128 06/05/2019   TRIG 109 06/05/2019   HDL 41 06/05/2019   LDLCALC 67 06/05/2019   ALT 9 06/05/2019   AST 16 06/05/2019   NA 134 04/07/2020   K 4.6 04/07/2020   CL 101 04/07/2020   CREATININE 1.28 (H) 04/07/2020   BUN 20 04/07/2020   CO2 23 04/07/2020   TSH 1.28 10/03/2016   Dated 01/12/20: cholesterol 135, triglycerides 93, HDL 36, LDL 80.TSH normal.    Wt Readings from Last 3 Encounters:  09/08/20 156 lb 12.8 oz (71.1 kg)  03/29/20 159 lb 6.4 oz (72.3 kg)  03/04/20 164 lb 9.6 oz (74.7 kg)      Other studies Reviewed: Additional studies/  records that were reviewed today include:   Echo 10/22/16: Study Conclusions  - Left ventricle: The cavity size was normal. There was moderate   concentric hypertrophy. Systolic function was normal. The   estimated ejection fraction was in the range of 55% to 60%. Wall   motion was normal; there were no regional wall motion   abnormalities. Features are consistent with a pseudonormal left   ventricular filling pattern, with concomitant abnormal relaxation   and increased filling pressure (grade 2 diastolic dysfunction). - Aortic valve: There was mild regurgitation. Valve area (VTI):   2.17 cm^2. - Mitral valve: Calcified annulus. There was mild regurgitation. - Left atrium: The atrium was moderately dilated. - Pulmonary arteries: PA peak pressure: 32 mm Hg (S).   Echo 08/21/18: Study Conclusions  - Left ventricle: The cavity size was normal. Wall thickness was   increased in a pattern of mild LVH. Mid to apical inferior   hypokinesis. Hypokinesis of the apex. The estimated ejection   fraction was 45%. Features are consistent with a pseudonormal   left ventricular filling pattern, with concomitant abnormal   relaxation and increased filling pressure (grade 2 diastolic   dysfunction). - Aortic valve: Trileaflet; moderately calcified leaflets. There   was mild stenosis. There was moderate eccentric regurgitation.   Mean gradient (S): 10 mm Hg. Valve area (VTI): 1.73 cm^2. - Mitral valve: Moderately calcified annulus. Mildly calcified   leaflets. There is some degree of restriction of the posterior   leaflet. There was moderate to severe regurgitation. - Left atrium: The atrium was moderately dilated. - Right ventricle: The cavity size was normal. Systolic function   was normal. - Right atrium: The atrium was moderately to severely dilated. - Tricuspid valve: There was moderate regurgitation. Peak RV-RA   gradient 39 mmHg. - Pulmonary arteries: PA peak pressure: 47 mm Hg (S). -  Systemic veins: IVC measured 2.5 cm with > 50% respirophasic   variation, suggesting RA pressure 8 mmHg.  Impressions:  - Normal LV size with mild LV hypertrophy. EF 45% with mid to   apical inferior hypokinesis and hypokinesis  of the apex. Moderate   diastolic dysfunction. Normal RV size and systolic function.   Biatrial enlargement. Moderate to severe mitral regurgitation   with restriction of the posterior leaflet. Mild aortic stenosis   with moderate eccentric regurgitation. Mild pulmonary   hypertension.   Cardiac cath/PCI: 08/21/18: CORONARY STENT INTERVENTION  LEFT HEART CATH AND CORONARY ANGIOGRAPHY  Conclusion     Prox RCA lesion is 95% stenosed.  Ost Cx lesion is 60% stenosed.  Prox Cx lesion is 60% stenosed.  Prox LAD lesion is 100% stenosed.  A drug-eluting stent was successfully placed.  Post intervention, there is a 0% residual stenosis.  There is moderate left ventricular systolic dysfunction.  LV end diastolic pressure is mildly elevated.  The left ventricular ejection fraction is 35-45% by visual estimate.   Gifford AKIL HOOS Sr. is a 86 y.o. male    [id number] LOCATION:  FACILITY: [facility]  PHYSICIAN: Quay Burow, M.D. 04-Aug-1932   DATE OF PROCEDURE:  08/21/2018  DATE OF DISCHARGE:     CARDIAC CATHETERIZATION / PCI DES RCA    History obtained from chart review.Mr. Potempa is an 63M with paroxysmal atrial fibrillation,myastheniagravis, hyperlipidemia and thrombocytopenia here with NSTEMI.    PROCEDURE DESCRIPTION:   The patient was brought to the second floor [facility]iac cath lab in the postabsorptive state. He was premedicated with IV Versed and fentanyl. His right wristwas prepped and shaved in usual sterile fashion. Xylocaine 1% was used for local anesthesia. A 6 French sheath was inserted into the right radial artery using standard Seldinger technique. The patient received 4000units  of heparin intravenously.   A 5 [place] TIG catheter and pigtail catheters were used for selective coronary angiography and left ventriculography respectively.  Isovue dye was used for the entirety of the case.  Retrograde aorta, left ventricular and pullback pressures were recorded.  Radial cocktail was administered via the SideArm sheath.  Patient received Brilinta 180 mg p.o. followed by Angiomax bolus and infusion with a therapeutic ACT demonstrated.  Using a 6 [place] JR4 guide catheter, 0.14 pro-water guidewire and a 2 mm x 12 mm balloon predilatation was performed to the proximal dominant RCA.  I then placed a 3 mm x 60 mm long Synergy drug-eluting stent across the diseased segment and deployed at 14 to 16 atm.  I then postdilated with a 3.25 x 12 mm long noncompliant balloon up to 16 atm (3.3 mm) resulting reduction with 95% ulcerated proximal dominant RCA plaque to 0% residual.  There was distal spasm resulting in ST segment elevation which resolved with administration of intracoronary nitroglycerin.  I then attempted to cross the mid LAD CTO using a 6 [place] XB LAD 4 cm curve guide catheter, pro-water and whisper wire and 1.5 mm balloon.  I did ultimately cross the lesion but I did not think I was intraluminal and ultimately aborted the procedure.   IMPRESSION: Successful proximal RCA PCI and drug-eluting stenting using a synergy drug-eluting stent postdilated to 3.3 mm with unsuccessful attempt at crossing mid LAD CTO.  His LV function is moderately reduced in the 40% range.  His LAD CTO can be treated in a staged fashion electively.  The sheath was removed and a TR band was placed on the right wrist to achieve patent hemostasis.  The patient left the lab in stable condition.  Quay Burow. MD, [facility] 08/21/2018 5:20 PM     Recommend uninterrupted dual antiplatelet therapy with Aspirin 78m daily and Ticagrelor 933mtwice daily for a minimum  of 12 months (ACS - Class I recommendation).   Echo 03/07/20:  IMPRESSIONS    1. Left ventricular ejection fraction, by estimation, is 60 to 65%. The  left ventricle has normal function. The left ventricle has no regional  wall motion abnormalities. Left ventricular diastolic function could not  be evaluated.  2. Right ventricular systolic function is normal. The right ventricular  size is normal. There is mildly elevated pulmonary artery systolic  pressure.  3. Left atrial size was severely dilated.  4. Right atrial size was severely dilated.  5. The mitral valve is normal in structure. Mild mitral valve  regurgitation. No evidence of mitral stenosis.  6. Tricuspid valve regurgitation is moderate.  7. The aortic valve is grossly normal. Aortic valve regurgitation is  mild. No aortic stenosis is present.  8. Aortic dilatation noted. There is mild dilatation of the ascending  aorta measuring 39 mm.   ASSESSMENT AND PLAN:  1.  CAD with s/p NSTEMI in November 2019 secondary to ruptured plaque lesion in the RCA. S/p DES. He has CTO of the mid LAD but has been asymptomatic. Given lack of symptoms or LV dysfunction in the anterior wall we have not recommended CTO PCI of the LAD. Off ASA due to need to be onanticoagulation.  Intolerant of Brilinta in the past. He is having some very atypical localized pain that does not sound anginal at all.   2. Hyperlipidemia. LDL now 67 at goal on dietary therapy. Given prior reaction apparently to statins will focus on dietary modification for now.   3. LV dysfunction acutely at time of MI. EF 45%. Off ARB due to elevated potassium.   Euvolemic. No beta blocker due to history of bradycardia. Repeat recent Echo showed normal EF.   4. HTN. Elevated. On amlodipine 5 mg daily and lasix. Patient reports better BP readings at home. Continue amlodipine.   5. Myastenia gravis in remission  6. Hypothyroidism.   7. Atrial flutter - recurrent. Rate is well controlled. He does not appear to be symptomatic at this  time.  On no AV nodal blocking agents.  Now on  Xarelto 15 mg daily. No ASA or NSAIDs. S/p successful DCCV on 03/24/20 but now recurrent. At this point I would continue with current medication.  I am afraid with his slow ventricular response with flutter if we do use an AAD he may require a pacemaker.   8. Daily chills of unclear etiology. No localizing symptoms of infection. Recommend follow up with primary care.    Current medicines are reviewed at length with the patient today.  The patient does not have concerns regarding medicines.  The following changes have been made:  no change  Follow up in 6 months.  Signed, Kena Limon Martinique, MD  09/08/2020 4:35 PM    [facility] [address], [place], [place], [address] Phone [phone], Fax [phone]

## 2020-09-05 ENCOUNTER — Other Ambulatory Visit: Payer: Self-pay

## 2020-09-05 ENCOUNTER — Ambulatory Visit (INDEPENDENT_AMBULATORY_CARE_PROVIDER_SITE_OTHER): Payer: Medicare Other | Admitting: Podiatry

## 2020-09-05 ENCOUNTER — Encounter: Payer: Self-pay | Admitting: Podiatry

## 2020-09-05 DIAGNOSIS — M79675 Pain in left toe(s): Secondary | ICD-10-CM

## 2020-09-05 DIAGNOSIS — M2012 Hallux valgus (acquired), left foot: Secondary | ICD-10-CM

## 2020-09-05 DIAGNOSIS — M79674 Pain in right toe(s): Secondary | ICD-10-CM

## 2020-09-05 DIAGNOSIS — Z20822 Contact with and (suspected) exposure to covid-19: Secondary | ICD-10-CM | POA: Diagnosis not present

## 2020-09-05 DIAGNOSIS — M2011 Hallux valgus (acquired), right foot: Secondary | ICD-10-CM

## 2020-09-05 DIAGNOSIS — B351 Tinea unguium: Secondary | ICD-10-CM | POA: Diagnosis not present

## 2020-09-05 NOTE — Progress Notes (Signed)
Subjective:  Patient ID: Marcus Kaufmann Sr., male    DOB: [birth year],  MRN: [id number]  Marcus Jetty Peeks Sr. presents to clinic today for painful thick toenails that are difficult to trim. Pain interferes with ambulation. Aggravating factors include wearing enclosed shoe gear. Pain is relieved with periodic professional debridement..  Patient states he did order and received the compounded topical antifungal solution from [facility], but has not started to use it yet.  He also states he would like to go back to 3 month appointments, because 4 months was too long for him as he started to feel his toenails hitting the end of his shoe.  Review of Systems: Negative except as noted in the HPI. Past Medical History:  Diagnosis Date  . Acute on chronic combined systolic and diastolic CHF (congestive heart failure) ([place]) 08/22/2018  . Acute on chronic respiratory failure ([place]) 04/15/2015  . CAD in native artery 08/22/2018  . CAP (community acquired pneumonia) 04/13/2015   See cxr 04/13/2015 > admit [facility]    . Complication of anesthesia   . H/O hiatal hernia   . Hyperlipidemia LDL goal <70 08/22/2018  . Hypertension 08/22/2018  . Hypothyroidism 04/13/2015  . Myasthenia gravis ([place]) 04/13/2015  . Obesity 04/13/2015  . Paroxysmal A-fib ([place]) 04/16/2015  . S/P angioplasty with stent 08/21/18 DES to RCA 08/22/2018  . Sepsis ([place]) 04/15/2015   Past Surgical History:  Procedure Laterality Date  . AMPUTATION  06/11/2012   Procedure: AMPUTATION DIGIT;  Surgeon: [name], MD;  Location: [place];  Service: Orthopedics;  Laterality: Left;  revision of amputation  . CARDIOVERSION N/A 03/24/2020   Procedure: CARDIOVERSION;  Surgeon: [name], [name], MD;  Location: [facility] ENDOSCOPY;  Service: Cardiovascular;  Laterality: N/A;  . CORONARY STENT INTERVENTION N/A 08/21/2018   Procedure: CORONARY STENT INTERVENTION;  Surgeon: [name], MD;  Location: [place] CV LAB;  Service: Cardiovascular;   Laterality: N/A;  . HERNIA REPAIR  1994  . LEFT HEART CATH AND CORONARY ANGIOGRAPHY N/A 08/21/2018   Procedure: LEFT HEART CATH AND CORONARY ANGIOGRAPHY;  Surgeon: [name], MD;  Location: [place] CV LAB;  Service: Cardiovascular;  Laterality: N/A;  . ORIF SHOULDER FRACTURE  06/13/2012   Procedure: OPEN REDUCTION INTERNAL FIXATION (ORIF) SHOULDER FRACTURE;  Surgeon: [name], MD;  Location: [place];  Service: Orthopedics;  Laterality: Left;  LEFT SHOULDER OPEN GREATER TUBEROSITY ORIF  . SHOULDER CLOSED REDUCTION  06/11/2012   Procedure: CLOSED MANIPULATION SHOULDER;  Surgeon: [name], MD;  Location: [place];  Service: Orthopedics;  Laterality: Left;    Current Outpatient Medications:  .  amLODipine (NORVASC) 5 MG tablet, Take 1 tablet (5 mg total) by mouth daily., Disp: 30 tablet, Rfl: 5 .  Ascorbic Acid (VITAMIN C) 1000 MG tablet, Take 1,000 mg by mouth daily., Disp: , Rfl:  .  cholecalciferol (VITAMIN D3) 25 MCG (1000 UNIT) tablet, Take 1,000 Units by mouth daily., Disp: , Rfl:  .  furosemide (LASIX) 20 MG tablet, Take 1 tablet (20 mg total) by mouth daily., Disp: 90 tablet, Rfl: 3 .  levothyroxine (SYNTHROID) 100 MCG tablet, Take 100 mcg by mouth daily before breakfast., Disp: , Rfl:  .  nitroGLYCERIN (NITROSTAT) 0.4 MG SL tablet, Place 1 tablet (0.4 mg total) under the tongue every 5 (five) minutes x 3 doses as needed for chest pain., Disp: 25 tablet, Rfl: 4 .  Rivaroxaban (XARELTO) 15 MG TABS tablet, Take 1 tablet (15 mg total) by  mouth daily with supper., Disp: 30 tablet, Rfl: 5 .  sodium chloride (MURO 128) 5 % ophthalmic ointment, Place 1 drop into the right eye in the morning, at noon, in the evening, and at bedtime., Disp: , Rfl:  Allergies  Allergen Reactions  . Beta Adrenergic Blockers Hives  . Statins Hives   Social History   Occupational History  . Not on file  Tobacco Use  . Smoking status: Former Smoker    Packs/day: 1.00    Years: 30.00    Pack  years: 30.00    Types: Cigarettes    Quit date: 01/10/1982    Years since quitting: 38.6  . Smokeless tobacco: Never Used  Vaping Use  . Vaping Use: Never used  Substance and Sexual Activity  . Alcohol use: Yes    Alcohol/week: 2.0 standard drinks    Types: 2 Glasses of wine per week    Comment: 3-4 per week   . Drug use: No  . Sexual activity: Never    Objective:   Constitutional Marcus A Lafon Sr. is a pleasant 86 y.o. Caucasian male, WD, WN in NAD. AAO x 3.   Vascular Capillary refill time to digits immediate b/l. Palpable pedal pulses b/l LE. Pedal hair sparse. Lower extremity skin temperature gradient within normal limits. No pain with calf compression LLE. No cyanosis or clubbing noted.  Neurologic Normal speech. Oriented to person, place, and time. Protective sensation intact 5/5 intact bilaterally with 10g monofilament b/l. Vibratory sensation intact b/l. Proprioception intact bilaterally.  Dermatologic Pedal skin with normal turgor, texture and tone bilaterally. No open wounds bilaterally. No interdigital macerations bilaterally. Toenails 1-5 b/l elongated, discolored, dystrophic, thickened, crumbly with subungual debris and tenderness to dorsal palpation.  Orthopedic: Normal muscle strength 5/5 to all lower extremity muscle groups bilaterally. No pain crepitus or joint limitation noted with ROM b/l. Hallux valgus with bunion deformity noted b/l lower extremities.   Radiographs: None Assessment:   1. Pain due to onychomycosis of toenails of both feet   2. Hallux valgus, acquired, bilateral    Plan:  Patient was evaluated and treated and all questions answered.  Onychomycosis with pain -Nails palliatively debridement as below -Educated on self-care  Procedure: Nail Debridement Rationale: Pain Type of Debridement: manual, sharp debridement. Instrumentation: Nail nipper, rotary burr. Number of Nails: 10  -Examined patient. -He has received Rx for compounded topical  antifungal medication from [place] and has not used it. He states he will attempt to use it as he has problems reaching -Patient to continue soft, supportive shoe gear daily. -Toenails 1-5 b/l were debrided in length and girth with sterile nail nippers and dremel without iatrogenic bleeding.  -Patient to report any pedal injuries to medical professional immediately. -Patient/POA to call should there be question/concern in the interim.  Return in about 3 months (around 12/04/2020) for painful mycotic toenails.  Marzetta Board, DPM

## 2020-09-06 DIAGNOSIS — Z23 Encounter for immunization: Secondary | ICD-10-CM | POA: Diagnosis not present

## 2020-09-07 ENCOUNTER — Telehealth: Payer: Self-pay | Admitting: *Deleted

## 2020-09-07 NOTE — Telephone Encounter (Signed)
Left message to call back   Marcus [name], Marcus A Sr. to [place], [name], MD     09/06/20 7:03 PM Dear Dr Martinique: I have an appointment on the ninth at four pm.For  the past few weeks I have been suffering from chills usually starting around lunch time and most days requiring me to go to bed with blankets to stay warm.I had a covid test yesterday with a negative result and have no other symptoms.I also have been experiencing a moderate but nearly constant pain on the left side of my chest.It is not like that requiring nitro but more of a pinching pain.Is there any test you can prescribe that might source the chills .Thanks,RB

## 2020-09-07 NOTE — Telephone Encounter (Signed)
Spoke to patient he stated for the past 3 to 4 weeks he has been having chills after lunch.Stated chills will last the rest of the afternoon.No fever.No cough.He has been having a pinching pain in left chest off and on.Stated he tested negative for covid this past Monday 12/6.Advised he has appointment already scheduled with Dr.[name] tomorrow 12/9 at 4:00 pm.Advised to keep appointment.I will send Dr.[name] this message to make him aware.

## 2020-09-08 ENCOUNTER — Ambulatory Visit (INDEPENDENT_AMBULATORY_CARE_PROVIDER_SITE_OTHER): Payer: Medicare Other | Admitting: Cardiology

## 2020-09-08 ENCOUNTER — Other Ambulatory Visit: Payer: Self-pay

## 2020-09-08 ENCOUNTER — Encounter: Payer: Self-pay | Admitting: Cardiology

## 2020-09-08 VITALS — BP 175/82 | HR 68 | Ht 68.0 in | Wt 156.8 lb

## 2020-09-08 DIAGNOSIS — I10 Essential (primary) hypertension: Secondary | ICD-10-CM | POA: Diagnosis not present

## 2020-09-08 DIAGNOSIS — I5022 Chronic systolic (congestive) heart failure: Secondary | ICD-10-CM

## 2020-09-08 DIAGNOSIS — I4892 Unspecified atrial flutter: Secondary | ICD-10-CM

## 2020-09-08 DIAGNOSIS — I2510 Atherosclerotic heart disease of native coronary artery without angina pectoris: Secondary | ICD-10-CM

## 2020-09-19 ENCOUNTER — Other Ambulatory Visit: Payer: Self-pay | Admitting: Cardiology

## 2020-09-23 ENCOUNTER — Other Ambulatory Visit: Payer: Self-pay | Admitting: Cardiology

## 2020-11-17 DIAGNOSIS — E785 Hyperlipidemia, unspecified: Secondary | ICD-10-CM | POA: Diagnosis not present

## 2020-11-17 DIAGNOSIS — R6883 Chills (without fever): Secondary | ICD-10-CM | POA: Diagnosis not present

## 2020-11-17 DIAGNOSIS — J439 Emphysema, unspecified: Secondary | ICD-10-CM | POA: Diagnosis not present

## 2020-11-17 DIAGNOSIS — I4892 Unspecified atrial flutter: Secondary | ICD-10-CM | POA: Diagnosis not present

## 2020-11-17 DIAGNOSIS — E039 Hypothyroidism, unspecified: Secondary | ICD-10-CM | POA: Diagnosis not present

## 2020-11-17 DIAGNOSIS — N401 Enlarged prostate with lower urinary tract symptoms: Secondary | ICD-10-CM | POA: Diagnosis not present

## 2020-11-17 DIAGNOSIS — I2510 Atherosclerotic heart disease of native coronary artery without angina pectoris: Secondary | ICD-10-CM | POA: Diagnosis not present

## 2020-11-17 DIAGNOSIS — M8580 Other specified disorders of bone density and structure, unspecified site: Secondary | ICD-10-CM | POA: Diagnosis not present

## 2020-11-17 DIAGNOSIS — H1820 Unspecified corneal edema: Secondary | ICD-10-CM | POA: Diagnosis not present

## 2020-11-17 DIAGNOSIS — D696 Thrombocytopenia, unspecified: Secondary | ICD-10-CM | POA: Diagnosis not present

## 2020-11-17 DIAGNOSIS — I10 Essential (primary) hypertension: Secondary | ICD-10-CM | POA: Diagnosis not present

## 2020-11-17 DIAGNOSIS — D649 Anemia, unspecified: Secondary | ICD-10-CM | POA: Diagnosis not present

## 2020-12-05 ENCOUNTER — Ambulatory Visit (INDEPENDENT_AMBULATORY_CARE_PROVIDER_SITE_OTHER): Payer: Medicare Other | Admitting: Podiatry

## 2020-12-05 ENCOUNTER — Encounter: Payer: Self-pay | Admitting: Podiatry

## 2020-12-05 ENCOUNTER — Telehealth: Payer: Self-pay | Admitting: Internal Medicine

## 2020-12-05 ENCOUNTER — Other Ambulatory Visit: Payer: Self-pay

## 2020-12-05 DIAGNOSIS — H0102B Squamous blepharitis left eye, upper and lower eyelids: Secondary | ICD-10-CM | POA: Diagnosis not present

## 2020-12-05 DIAGNOSIS — G629 Polyneuropathy, unspecified: Secondary | ICD-10-CM | POA: Diagnosis not present

## 2020-12-05 DIAGNOSIS — H0102A Squamous blepharitis right eye, upper and lower eyelids: Secondary | ICD-10-CM | POA: Diagnosis not present

## 2020-12-05 DIAGNOSIS — M79674 Pain in right toe(s): Secondary | ICD-10-CM

## 2020-12-05 DIAGNOSIS — B351 Tinea unguium: Secondary | ICD-10-CM | POA: Diagnosis not present

## 2020-12-05 DIAGNOSIS — H18231 Secondary corneal edema, right eye: Secondary | ICD-10-CM | POA: Diagnosis not present

## 2020-12-05 DIAGNOSIS — M2011 Hallux valgus (acquired), right foot: Secondary | ICD-10-CM

## 2020-12-05 DIAGNOSIS — M79675 Pain in left toe(s): Secondary | ICD-10-CM | POA: Diagnosis not present

## 2020-12-05 NOTE — Progress Notes (Signed)
  Subjective:  Patient ID: Marcus Kaufmann Sr., male    DOB: 12-29-1931,  MRN: [id number]  Marcus Jetty Peeks Sr. presents to clinic today for painful thick toenails that are difficult to trim. Pain interferes with ambulation. Aggravating factors include wearing enclosed shoe gear. Pain is relieved with periodic professional debridement..  Patient would like to discuss neuropathy on today's visit. States he has some symptoms of numbness and tingling, but they are intermittent presently.  Review of Systems: Negative except as noted in the HPI.  Allergies  Allergen Reactions  . Beta Adrenergic Blockers Hives  . Statins Hives   Objective:   Constitutional Marcus A Mckinny Sr. is a pleasant 87 y.o. Caucasian male, WD, WN in NAD. AAO x 3.   Vascular Capillary refill time to digits immediate b/l. Palpable pedal pulses b/l LE. Pedal hair sparse. Lower extremity skin temperature gradient within normal limits. No pain with calf compression LLE. No cyanosis or clubbing noted.  Neurologic Normal speech. Oriented to person, place, and time. Pt has subjective symptoms of neuropathy. Protective sensation intact 5/5 intact bilaterally with 10g monofilament b/l. Vibratory sensation intact b/l. Proprioception intact bilaterally.  Dermatologic Pedal skin with normal turgor, texture and tone bilaterally. No open wounds bilaterally. No interdigital macerations bilaterally. Toenails 1-5 b/l elongated, discolored, dystrophic, thickened, crumbly with subungual debris and tenderness to dorsal palpation.  Orthopedic: Normal muscle strength 5/5 to all lower extremity muscle groups bilaterally. No pain crepitus or joint limitation noted with ROM b/l. Hallux valgus with bunion deformity noted b/l lower extremities.   Radiographs: None Assessment:   1. Pain due to onychomycosis of toenails of both feet   2. Hallux valgus, acquired, bilateral   3. Neuropathy    Plan:  Patient was evaluated and treated and all  questions answered.  Onychomycosis with pain -Nails palliatively debridement as below -Educated on self-care  Procedure: Nail Debridement Rationale: Pain Type of Debridement: manual, sharp debridement. Instrumentation: Nail nipper, rotary burr. Number of Nails: 10  -Examined patient. -We discussed causes and treatment of neuropathy. He has symptoms intermittently and will inform me when they become worse.  -Patient to continue soft, supportive shoe gear daily. -Toenails 1-5 b/l were debrided in length and girth with sterile nail nippers and dremel without iatrogenic bleeding.  -Patient to report any pedal injuries to medical professional immediately. -Patient/POA to call should there be question/concern in the interim.  Return in about 3 months (around 03/07/2021).  Marcus [name], DPM

## 2020-12-05 NOTE — Patient Instructions (Addendum)
Bradley and Daroff's neurology in clinical practice (8th ed., pp. 1853- 1929). Elsevier."> Goldman-Cecil medicine (26th ed., pp. 2489- 2501). Elsevier.">  Peripheral Neuropathy Peripheral neuropathy is a type of nerve damage. It affects nerves that carry signals between the spinal cord and the arms, legs, and the rest of the body (peripheral nerves). It does not affect nerves in the spinal cord or brain. In peripheral neuropathy, one nerve or a group of nerves may be damaged. Peripheral neuropathy is a broad category that includes many specific nerve disorders, like diabetic neuropathy, hereditary neuropathy, and carpal tunnel syndrome. What are the causes? This condition may be caused by:  Diabetes. This is the most common cause of peripheral neuropathy.  Nerve injury.  Pressure or stress on a nerve that lasts a long time.  Lack (deficiency) of B vitamins. This can result from alcoholism, poor diet, or a restricted diet.  Infections.  Autoimmune diseases, such as rheumatoid arthritis and systemic lupus erythematosus.  Nerve diseases that are passed from parent to child (inherited).  Some medicines, such as cancer medicines (chemotherapy).  Poisonous (toxic) substances, such as lead and mercury.  Too little blood flowing to the legs.  Kidney disease.  Thyroid disease. In some cases, the cause of this condition is not known. What are the signs or symptoms? Symptoms of this condition depend on which of your nerves is damaged. Common symptoms include:  Loss of feeling (numbness) in the feet, hands, or both.  Tingling in the feet, hands, or both.  Burning pain.  Very sensitive skin.  Weakness.  Not being able to move a part of the body (paralysis).  Muscle twitching.  Clumsiness or poor coordination.  Loss of balance.  Not being able to control your bladder.  Feeling dizzy.  Sexual problems. How is this diagnosed? Diagnosing and finding the cause of peripheral  neuropathy can be difficult. Your health care provider will take your medical history and do a physical exam. A neurological exam will also be done. This involves checking things that are affected by your brain, spinal cord, and nerves (nervous system). For example, your health care provider will check your reflexes, how you move, and what you can feel. You may have other tests, such as:  Blood tests.  Electromyogram (EMG) and nerve conduction tests. These tests check nerve function and how well the nerves are controlling the muscles.  Imaging tests, such as CT scans or MRI to rule out other causes of your symptoms.  Removing a small piece of nerve to be examined in a lab (nerve biopsy).  Removing and examining a small amount of the fluid that surrounds the brain and spinal cord (lumbar puncture). How is this treated? Treatment for this condition may involve:  Treating the underlying cause of the neuropathy, such as diabetes, kidney disease, or vitamin deficiencies.  Stopping medicines that can cause neuropathy, such as chemotherapy.  Medicine to help relieve pain. Medicines may include: ? Prescription or over-the-counter pain medicine. ? Antiseizure medicine. ? Antidepressants. ? Pain-relieving patches that are applied to painful areas of skin.  Surgery to relieve pressure on a nerve or to destroy a nerve that is causing pain.  Physical therapy to help improve movement and balance.  Devices to help you move around (assistive devices). Follow these instructions at home: Medicines  Take over-the-counter and prescription medicines only as told by your health care provider. Do not take any other medicines without first asking your health care provider.  Do not drive or use heavy   machinery while taking prescription pain medicine. Lifestyle  Do not use any products that contain nicotine or tobacco, such as cigarettes and e-cigarettes. Smoking keeps blood from reaching damaged nerves.  If you need help quitting, ask your health care provider.  Avoid or limit alcohol. Too much alcohol can cause a vitamin B deficiency, and vitamin B is needed for healthy nerves.  Eat a healthy diet. This includes: ? Eating foods that are high in fiber, such as fresh fruits and vegetables, whole grains, and beans. ? Limiting foods that are high in fat and processed sugars, such as fried or sweet foods.   General instructions  If you have diabetes, work closely with your health care provider to keep your blood sugar under control.  If you have numbness in your feet: ? Check every day for signs of injury or infection. Watch for redness, warmth, and swelling. ? Wear padded socks and comfortable shoes. These help protect your feet.  Develop a good support system. Living with peripheral neuropathy can be stressful. Consider talking with a mental health specialist or joining a support group.  Use assistive devices and attend physical therapy as told by your health care provider. This may include using a walker or a cane.  Keep all follow-up visits as told by your health care provider. This is important.   Contact a health care provider if:  You have new signs or symptoms of peripheral neuropathy.  You are struggling emotionally from dealing with peripheral neuropathy.  Your pain is not well-controlled. Get help right away if:  You have an injury or infection that is not healing normally.  You develop new weakness in an arm or leg.  You have fallen or do so frequently. Summary  Peripheral neuropathy is when the nerves in the arms, or legs are damaged, resulting in numbness, weakness, or pain.  There are many causes of peripheral neuropathy, including diabetes, pinched nerves, vitamin deficiencies, autoimmune disease, and hereditary conditions.  Diagnosing and finding the cause of peripheral neuropathy can be difficult. Your health care provider will take your medical history, do a  physical exam, and do tests, including blood tests and nerve function tests.  Treatment involves treating the underlying cause of the neuropathy and taking medicines to help control pain. Physical therapy and assistive devices may also help. This information is not intended to replace advice given to you by your health care provider. Make sure you discuss any questions you have with your health care provider. Document Revised: 06/28/2020 Document Reviewed: 06/28/2020 Elsevier Patient Education  2021 Elsevier Inc.  

## 2020-12-05 NOTE — Telephone Encounter (Signed)
Called and spoke with patient who states he has been having increased shortness of breath the last couple weeks. Advised patient that Dr. Melvyn Novas is in [place] this week, has no openings next week and then will be back in [place] the week after so I could get his scheduled with APP or not with Dr. Melvyn Novas until 3/28. Patient stated that he only wanted to see Dr. Melvyn Novas and was willing to drive to [place] to see him. Patient has been scheduled with Dr. Melvyn Novas in [place] for Thursday 3/10. Nothing further needed at this time.

## 2020-12-08 ENCOUNTER — Ambulatory Visit (INDEPENDENT_AMBULATORY_CARE_PROVIDER_SITE_OTHER): Payer: Medicare Other | Admitting: Internal Medicine

## 2020-12-08 ENCOUNTER — Encounter: Payer: Self-pay | Admitting: Internal Medicine

## 2020-12-08 ENCOUNTER — Other Ambulatory Visit: Payer: Self-pay

## 2020-12-08 ENCOUNTER — Ambulatory Visit (HOSPITAL_COMMUNITY)
Admission: RE | Admit: 2020-12-08 | Discharge: 2020-12-08 | Disposition: A | Discharge status: Home / Self Care | Payer: Medicare Other | Source: Ambulatory Visit | Attending: Internal Medicine | Admitting: Internal Medicine

## 2020-12-08 DIAGNOSIS — R0600 Dyspnea, unspecified: Secondary | ICD-10-CM | POA: Diagnosis not present

## 2020-12-08 DIAGNOSIS — R0609 Other forms of dyspnea: Secondary | ICD-10-CM

## 2020-12-08 NOTE — Progress Notes (Unsigned)
Subjective:     Patient ID: Marcus [name], male   DOB: 04/21/1932     MRN: [id number]    Brief patient profile:  88   yowm MG on daily prednisone since 2013 quit smoking around 1976 with pfts wnl 12/31/16 but suspected component of AB suppressed on chronic pred for MG    History of Present Illness   12/15/2015 acute extended ov/Wert re: cough /sob on prednisone 20 x one week  prior to [place]  Patient presents with  . Acute Visit    Pt c/o sob and cough x 3 days. Cough is prod with white sputum.   acute onset s travel this episode, comfortable at rest though more difficult in supine position due to sob/cough  last night first time this happened since dx of CAP but comfortable at rest sitting. rec dulera 100 Take 2 puffs first thing in am and then another 2 puffs about 12 hours later.  For cough / congestion mucinex dm 1200 mg every 12 hours Levaquin 514m daily x 7 days   NP eval 08/12/18 "chest burning" Prednisone 171mtablet  >>> 2 tabs for 5 days (2068maily), then 1 tab for 5 days (14m81mily), then resume 5 mg daily If not improving please contact our office or present to ER     08/19/2018 acute extended ov/Wert re: chest burning  Chief Complaint  Patient presents with  . Acute Visit    Still has "burning in lungs"- notices after exertionand he finds it uncomfortable to lie down.  He also c/o hoarseness today.   onset 08/09/18 while exerting gen ant chest burning assoc with doe and breathing got better at rest  but burning continued, non radiating   s n or v or diaphoresis and described as constant =sitting / supine resolved completely p taking  Pm ativan 08/17/18 and when woke up  100% better am 08/18/18 and stayed better until am of ov then  woke up with burning pain not related to breathing but this time just isolated to L parasternal where previously was bilateral.  No assoc cough/ no pain brought on by cough or deep breath or walking or relation to meals.  No flare  of weakness from MG, no better on prednisone rx. rec Protonix 40 mg Take 30- 60 min before your first and last meals of the day  (or pepcid 20 mg otc twice daily after meals, though this is not as effective as protonix)  Continue prednisone 5 mg daily  GERD  diet  check labs > c/w chf  / NSTEMI  > to ER > stent done  RCA/ ef 35-45 % with LVEDP 18 p diuresis     10/09/2018    ov/Wert re: prednisone 5mg 57m Chief Complaint  Patient presents with  . Follow-up    Breathing has improved and he no longer has the burning sensation like before. He c/o feeling light headed all the time.   Dyspnea:    MMRC2 = can't walk a nl pace on a flat grade s sob but does fine slow and flat   Cough: none Sleeping: bed flat/ pillows get him to 20 egrees  SABA use: none  02: none  rec You need to check your blood pressure and pulse  standing both while you are having bad symptoms and when are symptom free  - write them down and talk to your heart doctor about this and how to use /adjust  your  lasix dose  No pulmonary problem identified so follow up is as needed     12/08/2020  Re-establish  ov/[place] office/Wert re: off pred x 18 months and no recurrent dyshagia/diplopia or other symptoms he assoc wwith MG but more sob/ fatigue off pred vs on Chief Complaint  Patient presents with  . Follow-up    Pt states just had a physical and his PCP rec that he have f/u cxr. He states his breathing has been worse for the past several months. He states gets tired and SOB walking approx 1 block.    Dyspnea: one year worse fatigue s really limiting sob still a couple of aisles at [employer] leaning on the cart/ no HC  Cough: none  Sleeping: able to lie flat and 3 pillows x years  SABA use: none  02:none  Covid status: vax x 3  Lung cancer screening: n/a as quit smoking 1983  No HA/ no viz issues  Taking intervectin prophylactically since pandemic "3 billion people have taken it"      No obvious day to day or  daytime variability or assoc excess/ purulent sputum or mucus plugs or hemoptysis or cp or chest tightness, subjective wheeze or overt sinus or hb symptoms.   Sleeping as above  without nocturnal  or early am exacerbation  of respiratory  c/o's or need for noct saba. Also denies any obvious fluctuation of symptoms with weather or environmental changes or other aggravating or alleviating factors except as outlined above   No unusual exposure hx or h/o childhood pna/ asthma or knowledge of premature birth.  Current Allergies, Complete Past Medical History, Past Surgical History, Family History, and Social History were reviewed in [employer] record.  ROS  The following are not active complaints unless bolded Hoarseness, sore throat, dysphagia, dental problems, itching, sneezing,  nasal congestion or discharge of excess mucus or purulent secretions, ear ache,   fever, chills, sweats, unintended wt loss or wt gain, classically pleuritic or exertional cp,  orthopnea pnd or arm/hand swelling  or leg swelling, presyncope, palpitations, abdominal pain, anorexia, nausea, vomiting, diarrhea  or change in bowel habits or change in bladder habits, change in stools or change in urine, dysuria, hematuria,  rash, arthralgias/myalgias , visual complaints, headache, numbness, weakness or ataxia or problems with walking or coordination,  change in mood or  memory.        Current Meds  Medication Sig  . amLODipine (NORVASC) 5 MG tablet TAKE 1 TABLET(5 MG) BY MOUTH DAILY  . Ascorbic Acid (VITAMIN C) 1000 MG tablet Take 1,000 mg by mouth daily.  . cholecalciferol (VITAMIN D3) 25 MCG (1000 UNIT) tablet Take 1,000 Units by mouth daily.  Marland Kitchen ivermectin (STROMECTOL) 3 MG TABS tablet SMARTSIG:4 Tablet(s) By Mouth Every 2 Weeks  . sodium chloride (MURO 128) 5 % ophthalmic ointment Place 1 drop into the right eye in the morning, at noon, in the evening, and at bedtime.  Alveda Reasons 15 MG TABS tablet TAKE 1  TABLET(15 MG) BY MOUTH DAILY WITH SUPPER                Objective:   Physical Exam     12/08/2020          162  10/09/2018            156  09/01/2018          146  08/19/2018        168  05/19/2018  163  10/03/2016          185  01/03/2016          209   12/15/2015       206   04/13/15 210 lb 6.4 oz (95.437 kg)  06/13/12 205 lb 7.5 oz (93.2 kg)  06/11/12 202 lb (91.627 kg)      Vital signs reviewed  12/08/2020  - Note at rest 02 sats  98% on RA   General appearance:    Elderly wm a bit frail but nad  / a bit awkward getting up from chair to exam table   HEENT : pt wearing mask not removed for exam due to covid -19 concerns.    NECK :  without JVD/Nodes/TM/ nl carotid upstrokes bilaterally   LUNGS: no acc muscle use,  Nl contour chest which is clear to A and P bilaterally without cough on insp or exp maneuvers   CV:  RRR  no s3 or murmur or increase in P2, and no edema   ABD:  soft and nontender with nl inspiratory excursion in the supine position. No bruits or organomegaly appreciated, bowel sounds nl  MS:  Slt awkward gait/ ext warm without deformities, calf tenderness, cyanosis or clubbing No obvious joint restrictions   SKIN: warm and dry without lesions    NEURO:  alert, approp, nl sensorium with  no motor or cerebellar deficits apparent.       CXR PA and Lateral:   12/08/2020 :    I personally reviewed images and agree with radiology impression as follows:    Baseline appearance of the chest.  No evidence of acute disease.         labs reviewd from Dr Forde Dandy 11/2020 :    ESR 75 - o/w ok                    Assessment:

## 2020-12-08 NOTE — Patient Instructions (Addendum)
Please call Dr Posey Pronto re your leg weakness and numbness and high ESR   Please remember to go to the  x-ray department  @  [facility] for your tests - we will call you with the results when they are available      Please stop the Ivermectin as the risk of serious covid infection in you is minimal

## 2020-12-09 ENCOUNTER — Encounter: Payer: Self-pay | Admitting: Internal Medicine

## 2020-12-09 NOTE — Assessment & Plan Note (Addendum)
Onset  2017 in pt with MG since 2013  - spirometry 01/03/2016   FVC  3.91  10/03/2016  Walked RA x 3 laps @ 185 ft each stopped due to  End of study, nl pace, no sob or desat   Min fatigue - Spirometry 10/03/2016  FVC  5.13  But f/v loop not physiologic  - Echo 10/22/2016  Features are consistent with a pseudonormal left ventricular filling pattern, with concomitant abnormal relaxation and increased filling pressure (grade 2 diastolic dysfunction). - Aortic valve: There was mild regurgitation. Valve area (VTI): 2.17 cm^2. - Mitral valve: Calcified annulus. There was mild regurgitation. - Left atrium: The atrium was moderately dilated. - Pulmonary arteries: PA peak pressure: 32 mm Hg (S). PFTs 12/31/16  wnl with FVC 3.46 (96%)  - 08/19/2018  Walked RA x 2 laps @ 216f each stopped due to  End of study, no sob/no cp/no desats - 09/01/2018   Walked RA x one lap = 210 ft - stopped due to  Sob/ sats still 98%, pulse only 74 and no chest burning - off prednisone around early 2020  -  12/08/2020   Walked RA  approx   500 ft  @ avg pace  stopped due to  Legs gave out, no sob/ sats still 97-98%      I don't see any evidence at al of a lung condition here so rec finish up with cards f/u as planned and contact neurology as this is likely mild MG or he's between flares at present.   No need for pulmonary f/u is as needed  I am concerned about the high ESR and use of Intervectin but referred him back to neuro/ IM to address these loose end   Each maintenance medication was reviewed in detail including emphasizing most importantly the difference between maintenance and prns and under what circumstances the prns are to be triggered using an action plan format where appropriate.  Total time for H and P, chart review, counseling,  directly observing portions of ambulatory 02 saturation study/ and generating customized AVS unique to this office visit / same day charting => 40 min

## 2020-12-09 NOTE — Progress Notes (Signed)
Cardiology Office Note    Date:  12/12/2020   ID:  Marcus Kaufmann Sr., DOB [birth year], MRN [id number]  PCP:  [name], MD  Cardiologist:   Durwin Davisson [place], MD   Chief Complaint  Patient presents with  . Follow-up    12 months.  . Shortness of Breath  . Headache      History of Present Illness: Marcus GROMAN Sr. is a 87 y.o. male who is seen for follow up CAD. He has a history of myasthenia gravis and remote PAF in the setting of pneumonia July 2016.  He presented 08/20/2018 with a non-ST elevation MI.  08/21/2018 he underwent diagnostic catheterization and intervention with a DES to his RCA.   Patient did have a residual total LAD that Dr [name] attempted to cross but it appeared the wire got subintimal and so the procedure was aborted. He also has a 60% circumflex.  Echocardiogram showed an EF of 40% with moderate to severe MR.  Though his LVEDP at cath was normal he was felt to be volume overloaded.  He was diuresed and sent home on a diuretic as well.    On initial follow up he complained of persistent SOB. Was seen by Dr. Melvyn Novas who felt this was not a pulmonary issue. Later switched from Brilinta to Plavix with significant improvement in symptoms. He was considered for potential CTO PCI of the LAD but since he was asymptomatic this was not recommended.   He was seen recently in the ED on 03/01/20 after experiencing an episode of dyspnea and left arm pain while walking to his mailbox. He states prior to this he was very inactive and decided to start walking. He walked 30 minutes a day for 4 days before this occurred. In the ED he was found to be in atrial flutter with 4:1 conduction and controlled rate. He was severely hypertensive. Potassium was elevated at 5.8. troponin was 15>18. CXR NAD. Admission was considered but patient refused. Losartan was stopped and he was started on amlodipine. Also placed on Xarelto. Repeat Echo done and showed improvement in LV function with normal  EF. Only mild MR. Severe biatrial enlargement. After anticoagulation for 3-4 weeks he did undergo successful DCCV on 03/24/20. He was started on lasix 20 mg daily. Noted significant improvement in his edema. When seen on June 29 was maintaining NSR.  On follow up today he reports he is not feeling well. Notes if he stands very long his legs go numb. He feels weak. Has daily chills in the afternoon without fever. No rashes. Had lab work in February. CBC, UA, and TSH OK. Sed rate was 70. LDL 91. Was seen by Dr Melvyn Novas this week and CXR was OK. No palpitations, edema, chest pain.  Past Medical History:  Diagnosis Date  . Acute on chronic combined systolic and diastolic CHF (congestive heart failure) ([place]) 08/22/2018  . Acute on chronic respiratory failure ([place]) 04/15/2015  . CAD in native artery 08/22/2018  . CAP (community acquired pneumonia) 04/13/2015   See cxr 04/13/2015 > admit [facility]    . Complication of anesthesia   . H/O hiatal hernia   . Hyperlipidemia LDL goal <70 08/22/2018  . Hypertension 08/22/2018  . Hypothyroidism 04/13/2015  . Myasthenia gravis ([place]) 04/13/2015  . Obesity 04/13/2015  . Paroxysmal A-fib ([place]) 04/16/2015  . S/P angioplasty with stent 08/21/18 DES to RCA 08/22/2018  . Sepsis ([place]) 04/15/2015    Past Surgical History:  Procedure Laterality Date  .  AMPUTATION  06/11/2012   Procedure: AMPUTATION DIGIT;  Surgeon: [name], MD;  Location: [place];  Service: Orthopedics;  Laterality: Left;  revision of amputation  . CARDIOVERSION N/A 03/24/2020   Procedure: CARDIOVERSION;  Surgeon: [name], [name], MD;  Location: [facility] ENDOSCOPY;  Service: Cardiovascular;  Laterality: N/A;  . CORONARY STENT INTERVENTION N/A 08/21/2018   Procedure: CORONARY STENT INTERVENTION;  Surgeon: [name], MD;  Location: [place] CV LAB;  Service: Cardiovascular;  Laterality: N/A;  . HERNIA REPAIR  1994  . LEFT HEART CATH AND CORONARY ANGIOGRAPHY N/A 08/21/2018   Procedure: LEFT HEART CATH AND  CORONARY ANGIOGRAPHY;  Surgeon: [name], MD;  Location: [place] CV LAB;  Service: Cardiovascular;  Laterality: N/A;  . ORIF SHOULDER FRACTURE  06/13/2012   Procedure: OPEN REDUCTION INTERNAL FIXATION (ORIF) SHOULDER FRACTURE;  Surgeon: [name], MD;  Location: [place];  Service: Orthopedics;  Laterality: Left;  LEFT SHOULDER OPEN GREATER TUBEROSITY ORIF  . SHOULDER CLOSED REDUCTION  06/11/2012   Procedure: CLOSED MANIPULATION SHOULDER;  Surgeon: [name], MD;  Location: [place];  Service: Orthopedics;  Laterality: Left;     Current Outpatient Medications  Medication Sig Dispense Refill  . amLODipine (NORVASC) 5 MG tablet TAKE 1 TABLET(5 MG) BY MOUTH DAILY 30 tablet 5  . Ascorbic Acid (VITAMIN C) 1000 MG tablet Take 1,000 mg by mouth daily.    . cholecalciferol (VITAMIN D3) 25 MCG (1000 UNIT) tablet Take 1,000 Units by mouth daily.    . sodium chloride (MURO 128) 5 % ophthalmic ointment Place 1 drop into the right eye in the morning, at noon, in the evening, and at bedtime.    Alveda Reasons 15 MG TABS tablet TAKE 1 TABLET(15 MG) BY MOUTH DAILY WITH SUPPER 30 tablet 5   No current facility-administered medications for this visit.    Allergies:   Beta adrenergic blockers and Statins    Social History:  The patient  reports that he quit smoking about 38 years ago. His smoking use included cigarettes. He has a 30.00 pack-year smoking history. He has never used smokeless tobacco. He reports current alcohol use of about 2.0 standard drinks of alcohol per week. He reports that he does not use drugs.   Family History:  The patient's family history includes Healthy in his daughter, sister, and son; Heart attack in his father; Other in his mother.    ROS:  Please see the history of present illness.   Otherwise, review of systems are positive for none.   All other systems are reviewed and negative.    PHYSICAL EXAM: VS:  BP (!) 156/76 (BP Location: Left Arm, Patient Position:  Sitting, Cuff Size: Normal)   Pulse 76   Ht _0  (1.727 m)   Wt 160 lb (72.6 kg)   BMI 24.33 kg/m  , BMI Body mass index is 24.33 kg/m. GEN: Well nourished, well developed, in no acute distress  HEENT: normal  Neck: no JVD, carotid bruits, or masses Cardiac: RRR; gr 1/6 systolic murmur RUSB, rubs, or gallops,no edema  Respiratory:  clear to auscultation bilaterally, normal work of breathing GI: soft, nontender, nondistended, + BS MS: no deformity or atrophy. Pedal pulses are excellent. Skin: warm and dry, no rash Neuro:  Strength and sensation are intact Psych: euthymic mood, full affect   EKG:  EKG is not ordered today.      Lab Results  Component Value Date   WBC 6.2 03/01/2020  HGB 11.1 (L) 03/01/2020   HCT 34.6 (L) 03/01/2020   PLT 120 (L) 03/01/2020   GLUCOSE 132 (H) 04/07/2020   CHOL 128 06/05/2019   TRIG 109 06/05/2019   HDL 41 06/05/2019   LDLCALC 67 06/05/2019   ALT 9 06/05/2019   AST 16 06/05/2019   NA 134 04/07/2020   K 4.6 04/07/2020   CL 101 04/07/2020   CREATININE 1.28 (H) 04/07/2020   BUN 20 04/07/2020   CO2 23 04/07/2020   TSH 1.28 10/03/2016   Dated 01/12/20: cholesterol 135, triglycerides 93, HDL 36, LDL 80.TSH normal.  Dated 11/17/20: cholesterol 152, triglycerides 84, HDL 44, LDL 91. UA, chemistries normal    Wt Readings from Last 3 Encounters:  12/12/20 160 lb (72.6 kg)  12/08/20 162 lb (73.5 kg)  09/08/20 156 lb 12.8 oz (71.1 kg)      Other studies Reviewed: Additional studies/ records that were reviewed today include:   Echo 10/22/16: Study Conclusions  - Left ventricle: The cavity size was normal. There was moderate   concentric hypertrophy. Systolic function was normal. The   estimated ejection fraction was in the range of 55% to 60%. Wall   motion was normal; there were no regional wall motion   abnormalities. Features are consistent with a pseudonormal left   ventricular filling pattern, with concomitant abnormal  relaxation   and increased filling pressure (grade 2 diastolic dysfunction). - Aortic valve: There was mild regurgitation. Valve area (VTI):   2.17 cm^2. - Mitral valve: Calcified annulus. There was mild regurgitation. - Left atrium: The atrium was moderately dilated. - Pulmonary arteries: PA peak pressure: 32 mm Hg (S).   Echo 08/21/18: Study Conclusions  - Left ventricle: The cavity size was normal. Wall thickness was   increased in a pattern of mild LVH. Mid to apical inferior   hypokinesis. Hypokinesis of the apex. The estimated ejection   fraction was 45%. Features are consistent with a pseudonormal   left ventricular filling pattern, with concomitant abnormal   relaxation and increased filling pressure (grade 2 diastolic   dysfunction). - Aortic valve: Trileaflet; moderately calcified leaflets. There   was mild stenosis. There was moderate eccentric regurgitation.   Mean gradient (S): 10 mm Hg. Valve area (VTI): 1.73 cm^2. - Mitral valve: Moderately calcified annulus. Mildly calcified   leaflets. There is some degree of restriction of the posterior   leaflet. There was moderate to severe regurgitation. - Left atrium: The atrium was moderately dilated. - Right ventricle: The cavity size was normal. Systolic function   was normal. - Right atrium: The atrium was moderately to severely dilated. - Tricuspid valve: There was moderate regurgitation. Peak RV-RA   gradient 39 mmHg. - Pulmonary arteries: PA peak pressure: 47 mm Hg (S). - Systemic veins: IVC measured 2.5 cm with > 50% respirophasic   variation, suggesting RA pressure 8 mmHg.  Impressions:  - Normal LV size with mild LV hypertrophy. EF 45% with mid to   apical inferior hypokinesis and hypokinesis of the apex. Moderate   diastolic dysfunction. Normal RV size and systolic function.   Biatrial enlargement. Moderate to severe mitral regurgitation   with restriction of the posterior leaflet. Mild aortic stenosis    with moderate eccentric regurgitation. Mild pulmonary   hypertension.   Cardiac cath/PCI: 08/21/18: CORONARY STENT INTERVENTION  LEFT HEART CATH AND CORONARY ANGIOGRAPHY  Conclusion     Prox RCA lesion is 95% stenosed.  Ost Cx lesion is 60% stenosed.  Prox Cx lesion is  60% stenosed.  Prox LAD lesion is 100% stenosed.  A drug-eluting stent was successfully placed.  Post intervention, there is a 0% residual stenosis.  There is moderate left ventricular systolic dysfunction.  LV end diastolic pressure is mildly elevated.  The left ventricular ejection fraction is 35-45% by visual estimate.   Marcus MONTEE TALLMAN Sr. is a 87 y.o. male    [id number] LOCATION:  FACILITY: [facility]  PHYSICIAN: Quay Burow, M.D. 1932/06/27   DATE OF PROCEDURE:  08/21/2018  DATE OF DISCHARGE:     CARDIAC CATHETERIZATION / PCI DES RCA    History obtained from chart review.Marcus [name] is an 68M with paroxysmal atrial fibrillation,myastheniagravis, hyperlipidemia and thrombocytopenia here with NSTEMI.    PROCEDURE DESCRIPTION:   The patient was brought to the second floor [facility] cath lab in the postabsorptive state. He was premedicated with IV Versed and fentanyl. His right wristwas prepped and shaved in usual sterile fashion. Xylocaine 1% was used for local anesthesia. A 6 French sheath was inserted into the right radial artery using standard Seldinger technique. The patient received 4000units  of heparin intravenously.  A 5 [place] TIG catheter and pigtail catheters were used for selective coronary angiography and left ventriculography respectively.  Isovue dye was used for the entirety of the case.  Retrograde aorta, left ventricular and pullback pressures were recorded.  Radial cocktail was administered via the SideArm sheath.  Patient received Brilinta 180 mg p.o. followed by Angiomax bolus and infusion with a therapeutic ACT demonstrated.  Using a 6 [place] JR4  guide catheter, 0.14 pro-water guidewire and a 2 mm x 12 mm balloon predilatation was performed to the proximal dominant RCA.  I then placed a 3 mm x 60 mm long Synergy drug-eluting stent across the diseased segment and deployed at 14 to 16 atm.  I then postdilated with a 3.25 x 12 mm long noncompliant balloon up to 16 atm (3.3 mm) resulting reduction with 95% ulcerated proximal dominant RCA plaque to 0% residual.  There was distal spasm resulting in ST segment elevation which resolved with administration of intracoronary nitroglycerin.  I then attempted to cross the mid LAD CTO using a 6 [place] XB LAD 4 cm curve guide catheter, pro-water and whisper wire and 1.5 mm balloon.  I did ultimately cross the lesion but I did not think I was intraluminal and ultimately aborted the procedure.   IMPRESSION: Successful proximal RCA PCI and drug-eluting stenting using a synergy drug-eluting stent postdilated to 3.3 mm with unsuccessful attempt at crossing mid LAD CTO.  His LV function is moderately reduced in the 40% range.  His LAD CTO can be treated in a staged fashion electively.  The sheath was removed and a TR band was placed on the right wrist to achieve patent hemostasis.  The patient left the lab in stable condition.  Quay Burow. MD, [facility] 08/21/2018 5:20 PM     Recommend uninterrupted dual antiplatelet therapy with Aspirin 66m daily and Ticagrelor 932mtwice daily for a minimum of 12 months (ACS - Class I recommendation).   Echo 03/07/20: IMPRESSIONS    1. Left ventricular ejection fraction, by estimation, is 60 to 65%. The  left ventricle has normal function. The left ventricle has no regional  wall motion abnormalities. Left ventricular diastolic function could not  be evaluated.  2. Right ventricular systolic function is normal. The right ventricular  size is normal. There is mildly elevated pulmonary artery systolic  pressure.  3. Left atrial size was severely  dilated.  4.  Right atrial size was severely dilated.  5. The mitral valve is normal in structure. Mild mitral valve  regurgitation. No evidence of mitral stenosis.  6. Tricuspid valve regurgitation is moderate.  7. The aortic valve is grossly normal. Aortic valve regurgitation is  mild. No aortic stenosis is present.  8. Aortic dilatation noted. There is mild dilatation of the ascending  aorta measuring 39 mm.   ASSESSMENT AND PLAN:  1.  CAD with s/p NSTEMI in November 2019 secondary to ruptured plaque lesion in the RCA. S/p DES. He has CTO of the mid LAD but has been asymptomatic. Given lack of symptoms or LV dysfunction in the anterior wall we have not recommended CTO PCI of the LAD. Off ASA due to need to be onanticoagulation.  Intolerant of Brilinta in the past. He is not having any angina.  2. Hyperlipidemia. LDL now 91. Given prior reaction apparently to statins will focus on dietary modification for now.   3. LV dysfunction acutely at time of MI. EF 45%. Off ARB due to elevated potassium.   Euvolemic. No beta blocker due to history of bradycardia. Repeat recent Echo showed normal EF.   4. HTN. On amlodipine 5 mg daily. Patient reports better BP readings at home. Continue amlodipine.   5. Myastenia gravis in remission  6. Hypothyroidism. Recent TFTs OK.   7. Atrial flutter - recurrent. Rate is well controlled. He is not symptomatic at this time.  On no AV nodal blocking agents.  Now on  Xarelto 15 mg daily. No ASA or NSAIDs. S/p successful DCCV on 03/24/20 but now recurrent. At this point I would continue with current medication.   8. Daily chills of unclear etiology. No localizing symptoms of infection. Generalized weakness. High sed rate.  Recommend follow up with primary care.    Current medicines are reviewed at length with the patient today.  The patient does not have concerns regarding medicines.  The following changes have been made:  no change  Follow up in 6  months.  Signed, Shakti Fleer Martin[place], MD  12/12/2020 3:58 PM    [facility] [address], [place], [place], [address] Phone [phone], Fax [phone]

## 2020-12-09 NOTE — Telephone Encounter (Signed)
Please advise on pt email, thanks!   Dr. Melvyn Novas: thank you for seeing me yesterday. Could you please advise your thoughts after reviewing my x-ray taken at [facility]. I am seeing Dr Martinique ,my heart doctor on Monday March 14 and like to share your evaluation of  what could be causing my problems.Best regards, Marcus [name]  12/18/1931

## 2020-12-12 ENCOUNTER — Ambulatory Visit (INDEPENDENT_AMBULATORY_CARE_PROVIDER_SITE_OTHER): Payer: Medicare Other | Admitting: Cardiology

## 2020-12-12 ENCOUNTER — Encounter: Payer: Self-pay | Admitting: Cardiology

## 2020-12-12 ENCOUNTER — Other Ambulatory Visit: Payer: Self-pay

## 2020-12-12 VITALS — BP 156/76 | HR 76 | Ht 68.0 in | Wt 160.0 lb

## 2020-12-12 DIAGNOSIS — I483 Typical atrial flutter: Secondary | ICD-10-CM | POA: Diagnosis not present

## 2020-12-12 DIAGNOSIS — I2510 Atherosclerotic heart disease of native coronary artery without angina pectoris: Secondary | ICD-10-CM

## 2020-12-12 DIAGNOSIS — E7800 Pure hypercholesterolemia, unspecified: Secondary | ICD-10-CM | POA: Diagnosis not present

## 2020-12-12 DIAGNOSIS — I10 Essential (primary) hypertension: Secondary | ICD-10-CM

## 2020-12-12 DIAGNOSIS — I5022 Chronic systolic (congestive) heart failure: Secondary | ICD-10-CM | POA: Diagnosis not present

## 2020-12-15 DIAGNOSIS — L821 Other seborrheic keratosis: Secondary | ICD-10-CM | POA: Diagnosis not present

## 2020-12-15 DIAGNOSIS — Z85828 Personal history of other malignant neoplasm of skin: Secondary | ICD-10-CM | POA: Diagnosis not present

## 2020-12-15 DIAGNOSIS — L570 Actinic keratosis: Secondary | ICD-10-CM | POA: Diagnosis not present

## 2020-12-15 DIAGNOSIS — L814 Other melanin hyperpigmentation: Secondary | ICD-10-CM | POA: Diagnosis not present

## 2020-12-15 DIAGNOSIS — L820 Inflamed seborrheic keratosis: Secondary | ICD-10-CM | POA: Diagnosis not present

## 2020-12-19 ENCOUNTER — Encounter: Payer: Self-pay | Admitting: Neurology

## 2020-12-19 ENCOUNTER — Other Ambulatory Visit: Payer: Self-pay

## 2020-12-19 ENCOUNTER — Ambulatory Visit (INDEPENDENT_AMBULATORY_CARE_PROVIDER_SITE_OTHER): Payer: Medicare Other | Admitting: Neurology

## 2020-12-19 ENCOUNTER — Other Ambulatory Visit (INDEPENDENT_AMBULATORY_CARE_PROVIDER_SITE_OTHER): Payer: Medicare Other

## 2020-12-19 VITALS — BP 181/81 | HR 61 | Ht 68.0 in | Wt 158.0 lb

## 2020-12-19 DIAGNOSIS — E538 Deficiency of other specified B group vitamins: Secondary | ICD-10-CM | POA: Diagnosis not present

## 2020-12-19 DIAGNOSIS — R29898 Other symptoms and signs involving the musculoskeletal system: Secondary | ICD-10-CM | POA: Diagnosis not present

## 2020-12-19 DIAGNOSIS — I2510 Atherosclerotic heart disease of native coronary artery without angina pectoris: Secondary | ICD-10-CM | POA: Diagnosis not present

## 2020-12-19 DIAGNOSIS — R292 Abnormal reflex: Secondary | ICD-10-CM | POA: Diagnosis not present

## 2020-12-19 LAB — VITAMIN B12: Vitamin B-12: 353 pg/mL (ref 211–911)

## 2020-12-19 NOTE — Patient Instructions (Addendum)
Check vitamin B12  We will check your blood flow to the legs and call you with the results.  Based on these results, we will decide if MRI lumbar spine is needed

## 2020-12-19 NOTE — Progress Notes (Signed)
Follow-up Visit   Date: 12/19/20   Marcus A Derise Sr. MRN: [id number] DOB: 07-Oct-1931   Interim History: Marcus Kaufmann Sr. is a 87 y.o. right-handed Caucasian male with hypothyroidism, paroxysmal atrial fibrillation, and seropositive myasthenia gravis, and prior tobacco use returning to the clinic with new complaints of bilateral leg numbness.  He has been followed here for myasthenia gravis.  The patient was accompanied to the clinic by self.  He was last seen in the fall of 2019 at which time his MG was well-controlled on prednisone 5mg  and mestinon 60mg .  He was lost to follow-up. Since this time, he has tapered off prednisone and mestinon and has been doing well.  No recurrent double vision, difficulty swallowing/talking, or focal limb weakness.   Today, he has generalized sense of fatigue.  He also complains of numbness involving both legs from the thigh down, which is triggered by standing > 4 minutes.  He is not able to walk as far, but denies having to take breaks.  No back pain.   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   He also reports that every afternoon, he complains of generalized body chills and fatigue.  He finds himself having to stay warm under a blanket.  He has intentionally lost a significant amount of weight after changing his diet to vegan. His labs have been unremarkable.     Medications:  Current Outpatient Medications on File Prior to Visit  Medication Sig Dispense Refill  . amLODipine (NORVASC) 5 MG tablet TAKE 1 TABLET(5 MG) BY MOUTH DAILY 30 tablet 5  . Ascorbic Acid (VITAMIN C) 1000 MG tablet Take 1,000 mg by mouth daily.    . cholecalciferol (VITAMIN D3) 25 MCG (1000 UNIT) tablet Take 1,000 Units by mouth daily.    Marland Kitchen levothyroxine (SYNTHROID) 137 MCG tablet Take 1 tablet by mouth daily.    . sodium chloride (MURO 128) 5 % ophthalmic ointment Place 1 drop into the right eye in the morning, at noon, in the evening, and at bedtime.    Alveda Reasons  15 MG TABS tablet TAKE 1 TABLET(15 MG) BY MOUTH DAILY WITH SUPPER 30 tablet 5   No current facility-administered medications on file prior to visit.    Allergies:  Allergies  Allergen Reactions  . Beta Adrenergic Blockers Hives  . Statins Hives    Review of Systems:  CONSTITUTIONAL: No fevers, chills, night sweats, + intentional weight loss.  EYES: No visual changes or eye pain ENT: No hearing changes.  No history of nose bleeds.   RESPIRATORY: No cough, wheezing or shortness of breath.   CARDIOVASCULAR: Negative for chest pain, and palpitations.   GI: Negative for abdominal discomfort, blood in stools or black stools.  No recent change in bowel habits.   GU:  No history of incontinence.   MUSCLOSKELETAL: No history of joint pain or swelling.  No myalgias.   SKIN: Negative for lesions, rash, and itching.   ENDOCRINE: Negative for cold or heat intolerance, polydipsia or goiter.   PSYCH:  No depression +anxiety symptoms.   NEURO: As Above.   Vital Signs:  BP (!) 181/81   Pulse 61   Ht 5\' 8"  (1.727 m)   Wt 158 lb (71.7 kg)   SpO2 100%   BMI 24.02 kg/m   Neurological Exam: MENTAL STATUS including orientation to time, place, person is intact.  Speech is not dysarthric.    CRANIAL NERVES:  Pupils are round and reactive to light.  Extraocular muscles intact.  No ptosis, no worsening with sustained upgaze.  No facial weakness.  Orbicularis oculi, orbicularis oris, and buccinator is 5/5.  Tongue is midline and motor strength is 5/5.  MOTOR:  Motor strength is 5/5 in all extremities.  Neck flexion is 5/5 throughout. There is no fatigability of muscles.  SENSORY:  Vibration intact throughout  MSRs:                                           Right        Left brachioradialis 2+  2+  biceps 2+  2+  triceps 2+  2+  patellar 3+  3+  ankle jerk 2+  2+   COORDINATION/GAIT:  He is able to rise to stand without using arms.  Gait is stable, narrow-based, and unassisted.  Data: Labs  01/19/2014: Vitamin B6 134 Labs 08/16/2014: AChR binding 7.2*, AChR blocking 46%, AChR modulating 42%   IMPRESSION/PLAN: 1.  Bilateral leg numbness worse with standing  - Check ABI  - Consider MRI lumbar spine going forward  2.  Seropositive myasthenia gravis, bulbar presentation, in remission (dx 2013).  Currently not on prednisone or mestinon. No weakness on exam.    3.  Body chills, fatigue unsure etiology.    - Check vitamin B12  - Follow-up with PCP  Further recommendations pending results.   Thank you for allowing me to participate in patient's care.  If I can answer any additional questions, I would be pleased to do so.    Sincerely,    Seab Axel K. Posey Pronto, DO

## 2020-12-22 ENCOUNTER — Ambulatory Visit: Payer: Medicare Other

## 2020-12-22 ENCOUNTER — Ambulatory Visit
Admission: RE | Admit: 2020-12-22 | Discharge: 2020-12-22 | Disposition: A | Discharge status: Home / Self Care | Payer: Medicare Other | Source: Ambulatory Visit | Attending: Neurology | Admitting: Neurology

## 2020-12-22 ENCOUNTER — Other Ambulatory Visit: Payer: Self-pay

## 2020-12-22 ENCOUNTER — Other Ambulatory Visit (INDEPENDENT_AMBULATORY_CARE_PROVIDER_SITE_OTHER): Payer: Medicare Other

## 2020-12-22 DIAGNOSIS — E538 Deficiency of other specified B group vitamins: Secondary | ICD-10-CM

## 2020-12-22 DIAGNOSIS — M6281 Muscle weakness (generalized): Secondary | ICD-10-CM | POA: Diagnosis not present

## 2020-12-22 DIAGNOSIS — R29898 Other symptoms and signs involving the musculoskeletal system: Secondary | ICD-10-CM

## 2020-12-22 MED ORDER — CYANOCOBALAMIN 1000 MCG/ML IJ SOLN
1000.0000 ug | Freq: Once | INTRAMUSCULAR | Status: AC
Start: 2020-12-22 — End: 2020-12-22
  Administered 2020-12-22: 1000 ug via INTRAMUSCULAR

## 2020-12-29 ENCOUNTER — Ambulatory Visit (INDEPENDENT_AMBULATORY_CARE_PROVIDER_SITE_OTHER): Payer: Medicare Other

## 2020-12-29 ENCOUNTER — Other Ambulatory Visit: Payer: Self-pay

## 2020-12-29 DIAGNOSIS — E538 Deficiency of other specified B group vitamins: Secondary | ICD-10-CM | POA: Diagnosis not present

## 2020-12-29 MED ORDER — CYANOCOBALAMIN 1000 MCG/ML IJ SOLN
1000.0000 ug | Freq: Once | INTRAMUSCULAR | Status: AC
  Administered 2020-12-29: 1000 ug via INTRAMUSCULAR

## 2021-01-05 ENCOUNTER — Ambulatory Visit: Payer: Medicare Other

## 2021-01-05 ENCOUNTER — Other Ambulatory Visit (INDEPENDENT_AMBULATORY_CARE_PROVIDER_SITE_OTHER): Payer: Medicare Other

## 2021-01-05 ENCOUNTER — Other Ambulatory Visit: Payer: Self-pay

## 2021-01-05 DIAGNOSIS — E538 Deficiency of other specified B group vitamins: Secondary | ICD-10-CM

## 2021-01-05 MED ORDER — CYANOCOBALAMIN 1000 MCG/ML IJ SOLN
1000.0000 ug | Freq: Once | INTRAMUSCULAR | Status: AC
  Administered 2021-01-05: 1000 ug via INTRAMUSCULAR

## 2021-01-12 ENCOUNTER — Other Ambulatory Visit: Payer: Self-pay

## 2021-01-12 ENCOUNTER — Ambulatory Visit (INDEPENDENT_AMBULATORY_CARE_PROVIDER_SITE_OTHER): Payer: Medicare Other

## 2021-01-12 DIAGNOSIS — E538 Deficiency of other specified B group vitamins: Secondary | ICD-10-CM | POA: Diagnosis not present

## 2021-01-12 MED ORDER — CYANOCOBALAMIN 1000 MCG/ML IJ SOLN
1000.0000 ug | Freq: Once | INTRAMUSCULAR | Status: AC
  Administered 2021-01-12: 1000 ug via INTRAMUSCULAR

## 2021-01-16 DIAGNOSIS — Z125 Encounter for screening for malignant neoplasm of prostate: Secondary | ICD-10-CM | POA: Diagnosis not present

## 2021-01-16 DIAGNOSIS — E039 Hypothyroidism, unspecified: Secondary | ICD-10-CM | POA: Diagnosis not present

## 2021-01-16 DIAGNOSIS — M859 Disorder of bone density and structure, unspecified: Secondary | ICD-10-CM | POA: Diagnosis not present

## 2021-01-16 DIAGNOSIS — E785 Hyperlipidemia, unspecified: Secondary | ICD-10-CM | POA: Diagnosis not present

## 2021-01-17 ENCOUNTER — Telehealth: Payer: Self-pay | Admitting: Cardiology

## 2021-01-17 ENCOUNTER — Telehealth: Payer: Self-pay

## 2021-01-17 MED ORDER — AMLODIPINE BESYLATE 10 MG PO TABS: 10.0000 mg | ORAL_TABLET | Freq: Every day | ORAL | 3 refills | Status: DC

## 2021-01-17 NOTE — Telephone Encounter (Signed)
In the absence of chest pain I don't feel a troponin level is indicated. Records reveal his BP was severely elevated when he was initially diagnosed with atrial flutter not when he had his NSTEMI in 2019. He has since had recurrent atrial flutter and rate is controlled. When seen last month BP was high but he stated it was better at home. I would recommend increasing amlodipine to 10 mg daily. Intolerant of ARBs due to elevated potassium  Phil Corti Martinique MD, [facility]

## 2021-01-17 NOTE — Telephone Encounter (Signed)
Received a call from [place] with [facility].She stated patient called their office to report his B/P was elevated 210/108.Stated he was requesting a troponin to be done.Stated she wanted Dr.[name]'s advice.Advised I will call patient.  Spoke to patient he stated he checked B/P this morning 210/110 pulse 74.Stated he lives close to [facility] and last time his B/P was elevated he was having heart problems.Stated he wanted to have troponin level checked.Stated he has been feeling ok, no chest pain,sob if over exerts.He has been having chills the last couple of days.No body aches.He is taking Amlodipine 5 mg daily.Advised Dr.[name] is out of office.I will send message to him for advice.

## 2021-01-17 NOTE — Telephone Encounter (Signed)
Spoke to patient Dr.[name]'s advice given.He will increase Amlodipine to 10 mg daily.Advised to call back if B/P continues to be elevated.Stated he wanted Dr.[name] to look at recent lab work done with PCP.Advised I will call and request your recent lab.I will show Dr.[name] when he is back in office.

## 2021-01-24 NOTE — Telephone Encounter (Signed)
He just needs to have a follow-up appointment with either Dr. Martinique or an APP.  He needs evaluation but I am unable to do this via MyChart message.  [place] B[place] T. Audie Box, MD, [place]  [address] [place], [place] [address] [phone]  11:52 AM

## 2021-01-24 NOTE — Telephone Encounter (Signed)
Called patient of Dr. Martinique to discuss MyChart message regarding weight gain & shortness of breath  He reports 10lb weight gain in about 1 week (previously weights were stable for years, per his report) He has LLE edema He reports he sleeps propped up but this is not a change for him He reports being unable to walk across his house without getting short of breath He sees pulm - had a good visit with them in March - on no inhalers Had a visit with [place]ique MD in March also  He used to be on lasix 40mg  >> 20mg  but this was removed from med list in March 2022 Patient said he has been off this for some time as he did not feel he needed it -- patient does have CHF  Advised MD is out of office, will send message to DOD (Dr. Audie Box) to advise

## 2021-01-24 NOTE — Telephone Encounter (Signed)
Called patient and scheduled for visit on 01/27/21 with [name] NP

## 2021-01-25 ENCOUNTER — Other Ambulatory Visit: Payer: Self-pay | Admitting: Endocrinology

## 2021-01-25 DIAGNOSIS — R7989 Other specified abnormal findings of blood chemistry: Secondary | ICD-10-CM

## 2021-01-25 DIAGNOSIS — D696 Thrombocytopenia, unspecified: Secondary | ICD-10-CM | POA: Diagnosis not present

## 2021-01-25 DIAGNOSIS — I2510 Atherosclerotic heart disease of native coronary artery without angina pectoris: Secondary | ICD-10-CM | POA: Diagnosis not present

## 2021-01-25 DIAGNOSIS — R600 Localized edema: Secondary | ICD-10-CM | POA: Diagnosis not present

## 2021-01-25 DIAGNOSIS — E785 Hyperlipidemia, unspecified: Secondary | ICD-10-CM | POA: Diagnosis not present

## 2021-01-25 DIAGNOSIS — J439 Emphysema, unspecified: Secondary | ICD-10-CM | POA: Diagnosis not present

## 2021-01-25 DIAGNOSIS — Z0000 Encounter for general adult medical examination without abnormal findings: Secondary | ICD-10-CM | POA: Diagnosis not present

## 2021-01-25 DIAGNOSIS — Z1339 Encounter for screening examination for other mental health and behavioral disorders: Secondary | ICD-10-CM | POA: Diagnosis not present

## 2021-01-25 DIAGNOSIS — G7000 Myasthenia gravis without (acute) exacerbation: Secondary | ICD-10-CM | POA: Diagnosis not present

## 2021-01-25 DIAGNOSIS — N1831 Chronic kidney disease, stage 3a: Secondary | ICD-10-CM | POA: Diagnosis not present

## 2021-01-25 DIAGNOSIS — I4892 Unspecified atrial flutter: Secondary | ICD-10-CM | POA: Diagnosis not present

## 2021-01-25 DIAGNOSIS — I700 Atherosclerosis of aorta: Secondary | ICD-10-CM | POA: Diagnosis not present

## 2021-01-25 DIAGNOSIS — I129 Hypertensive chronic kidney disease with stage 1 through stage 4 chronic kidney disease, or unspecified chronic kidney disease: Secondary | ICD-10-CM | POA: Diagnosis not present

## 2021-01-25 DIAGNOSIS — R945 Abnormal results of liver function studies: Secondary | ICD-10-CM

## 2021-01-25 DIAGNOSIS — R82998 Other abnormal findings in urine: Secondary | ICD-10-CM | POA: Diagnosis not present

## 2021-01-25 DIAGNOSIS — Z1331 Encounter for screening for depression: Secondary | ICD-10-CM | POA: Diagnosis not present

## 2021-01-26 NOTE — H&P (View-Only) (Signed)
Cardiology Office Note   Date:  01/27/2021   ID:  Marcus Kaufmann Sr., DOB 05/10/1932, MRN [id number]  PCP:  [name], MD  Cardiologist: Dr. Martinique CC: Weight gain and edema    History of Present Illness: Marcus MANGUAL Sr. is a 87 y.o. male who presents for ongoing assessment and management of CAD, with cardiac catheterization 08/21/2018 with intervention of the RCA with a drug-eluting stent.  He did have residual total LAD that Dr. Alvester Chou attempted to cross but it appeared that the wire got subintimal therefore the procedure was aborted.  He was found to have a 60% stenotic circumflex., remote PAF in the setting of pneumonia, with history of myasthenia gravis.Echocardiogram showed an EF of 40% with moderate to severe MR.  The patient was seen in the ED on 03/01/2020 after experiencing dyspnea and left arm pain while walking to his mailbox.  He had previously been very active walking 30 minutes a day.  He was found to be in atrial flutter with 4 1 conduction on arrival to ED and his rate was controlled.  He was also noted to be severely hypertensive.  He was hyperkalemic with a potassium of 5.8.  He was to be admitted but the patient refused.    Treatment included cessation of losartan and institution of amlodipine for blood pressure control.  He was also placed on Xarelto.  At that time his echo was also repeated which showed improvement of LV function to a normal EF with only mild MR.  However he did have severe biatrial enlargement.   After anticoagulation for 4 weeks he underwent a successful DCCV on 03/24/2020.  He was placed on Lasix 20 mg daily with significant improvement in fluid retention and blood pressure.  On follow-up the patient was found to be in normal sinus rhythm on March 29, 2020.  Of note, he is intolerant to Brilinta, and statins.  He was last seen in the office by Dr. Martinique on 12/12/2020 at which time he complained of not feeling well.  This occured when he was standing  for very long periods of time causing his legs to go numb causing him to feel weak.  He also complained of daily chills without fever.  On 01/24/2021 the patient called our office to report a 10 pound weight gain occurring over 1 week's time with lower extremity edema.  He was also endorsed dyspnea on exertion.  Apparently Lasix had been discontinued at some point and he did not have any.  He is here for follow-up to evaluate his symptoms.   He saw his primary care physician Dr. Forde Dandy who drew some labs which showed an elevated BNP 5244.  He was started back on Lasix 20 mg daily which can he has been on for 48 hours and has not noticed any changes in his weight or fluid retention.  He states that he used to walk a lot but has had to stop doing so and is now short of breath just walking across his living room.  He denies any PND or orthopnea.  He admits to eating a lot of salted popcorn over the last week or so.  Most recent labs on additional review did also reveal some elevation in ALT at 94 and AST at 55.  He is not on any statin therapy.  Dr. Charleen Kirks is ordering a CT scan of his abdomen and pelvis to evaluate liver and for any other abnormalities.   Past Medical History:  Diagnosis  Date  . Acute on chronic combined systolic and diastolic CHF (congestive heart failure) ([place]) 08/22/2018  . Acute on chronic respiratory failure ([place]) 04/15/2015  . CAD in native artery 08/22/2018  . CAP (community acquired pneumonia) 04/13/2015   See cxr 04/13/2015 > admit [facility]    . Complication of anesthesia   . H/O hiatal hernia   . Hyperlipidemia LDL goal <70 08/22/2018  . Hypertension 08/22/2018  . Hypothyroidism 04/13/2015  . Myasthenia gravis ([place]) 04/13/2015  . Obesity 04/13/2015  . Paroxysmal A-fib ([place]) 04/16/2015  . S/P angioplasty with stent 08/21/18 DES to RCA 08/22/2018  . Sepsis ([place]) 04/15/2015    Past Surgical History:  Procedure Laterality Date  . AMPUTATION  06/11/2012   Procedure: AMPUTATION  DIGIT;  Surgeon: [name], MD;  Location: [facility];  Service: Orthopedics;  Laterality: Left;  revision of amputation  . CARDIOVERSION N/A 03/24/2020   Procedure: CARDIOVERSION;  Surgeon: [name], [name], MD;  Location: [facility] ENDOSCOPY;  Service: Cardiovascular;  Laterality: N/A;  . CORONARY STENT INTERVENTION N/A 08/21/2018   Procedure: CORONARY STENT INTERVENTION;  Surgeon: [name], MD;  Location: [place] CV LAB;  Service: Cardiovascular;  Laterality: N/A;  . HERNIA REPAIR  1994  . LEFT HEART CATH AND CORONARY ANGIOGRAPHY N/A 08/21/2018   Procedure: LEFT HEART CATH AND CORONARY ANGIOGRAPHY;  Surgeon: [name], MD;  Location: [place] CV LAB;  Service: Cardiovascular;  Laterality: N/A;  . ORIF SHOULDER FRACTURE  06/13/2012   Procedure: OPEN REDUCTION INTERNAL FIXATION (ORIF) SHOULDER FRACTURE;  Surgeon: [name], MD;  Location: [place];  Service: Orthopedics;  Laterality: Left;  LEFT SHOULDER OPEN GREATER TUBEROSITY ORIF  . SHOULDER CLOSED REDUCTION  06/11/2012   Procedure: CLOSED MANIPULATION SHOULDER;  Surgeon: [name], MD;  Location: [place];  Service: Orthopedics;  Laterality: Left;     Current Outpatient Medications  Medication Sig Dispense Refill  . amLODipine (NORVASC) 10 MG tablet Take 1 tablet (10 mg total) by mouth daily. 90 tablet 3  . Ascorbic Acid (VITAMIN C) 1000 MG tablet Take 1,000 mg by mouth daily.    . cholecalciferol (VITAMIN D3) 25 MCG (1000 UNIT) tablet Take 1,000 Units by mouth daily.    . furosemide (LASIX) 40 MG tablet Take 40 mg by mouth.    . levothyroxine (SYNTHROID) 137 MCG tablet Take 1 tablet by mouth daily.    Marland Kitchen lisinopril (ZESTRIL) 2.5 MG tablet Take 1 tablet (2.5 mg total) by mouth daily. 30 tablet 3  . nitroGLYCERIN (NITROSTAT) 0.4 MG SL tablet Place 1 tablet (0.4 mg total) under the tongue every 5 (five) minutes as needed for chest pain. 25 tablet 6  . sodium chloride (MURO 128) 5 % ophthalmic ointment Place 1 drop into  the right eye in the morning, at noon, in the evening, and at bedtime.    Alveda Reasons 15 MG TABS tablet TAKE 1 TABLET(15 MG) BY MOUTH DAILY WITH SUPPER 30 tablet 5   No current facility-administered medications for this visit.    Allergies:   Beta adrenergic blockers and Statins    Social History:  The patient  reports that he quit smoking about 39 years ago. His smoking use included cigarettes. He has a 30.00 pack-year smoking history. He has never used smokeless tobacco. He reports current alcohol use of about 2.0 standard drinks of alcohol per week. He reports that he does not use drugs.   Family History:  The patient's family history includes Healthy in  his daughter, sister, and son; Heart attack in his father; Other in his mother.    ROS: All other systems are reviewed and negative. Unless otherwise mentioned in H&P    PHYSICAL EXAM: VS:  BP (!) 166/80 (BP Location: Left Arm, Patient Position: Sitting, Cuff Size: Normal)   Pulse (!) 58   Ht 5\' 8"  (1.727 m)   Wt 168 lb 9.6 oz (76.5 kg)   SpO2 96%   BMI 25.64 kg/m  , BMI Body mass index is 25.64 kg/m. GEN: Well nourished, well developed, in no acute distress HEENT: normal Neck: no JVD, carotid bruits, or masses Cardiac: RRR, occasional extrasystole; 2/6 systolic murmur heard best at the apex, slight pulsation in the abdomen at the substernal border, rubs, or gallops, 2+ pitting edema in ankles with 1+ pretibial edema  Respiratory:  Clear to auscultation bilaterally, normal work of breathing GI: soft, nontender, nondistended, + BS MS: no deformity or atrophy Skin: warm and dry, no rash Neuro:  Strength and sensation are intact, hard of hearing Psych: euthymic mood, full affect   EKG: Atrial flutter with 2-1 and 3-1 conduction, heart rate of 51 bpm, with right bundle branch block, left anterior fascicular block, with LVH.  (Personally reviewed)  Recent Labs: 03/01/2020: Hemoglobin 11.1; Magnesium 2.3; Platelets 120 04/07/2020:  BUN 20; Creatinine, Ser 1.28; Potassium 4.6; Sodium 134    Lipid Panel    Component Value Date/Time   CHOL 128 06/05/2019 0820   TRIG 109 06/05/2019 0820   HDL 41 06/05/2019 0820   CHOLHDL 3.1 06/05/2019 0820   CHOLHDL 2.8 08/21/2018 0924   VLDL 11 08/21/2018 0924   LDLCALC 67 06/05/2019 0820      Wt Readings from Last 3 Encounters:  01/27/21 168 lb 9.6 oz (76.5 kg)  12/19/20 158 lb (71.7 kg)  12/12/20 160 lb (72.6 kg)      Other studies Reviewed: Echo 08/21/18: Study Conclusions  - Left ventricle: The cavity size was normal. Wall thickness was increased in a pattern of mild LVH. Mid to apical inferior hypokinesis. Hypokinesis of the apex. The estimated ejection fraction was 45%. Features are consistent with a pseudonormal left ventricular filling pattern, with concomitant abnormal relaxation and increased filling pressure (grade 2 diastolic dysfunction). - Aortic valve: Trileaflet; moderately calcified leaflets. There was mild stenosis. There was moderate eccentric regurgitation. Mean gradient (S): 10 mm Hg. Valve area (VTI): 1.73 cm^2. - Mitral valve: Moderately calcified annulus. Mildly calcified leaflets. There is some degree of restriction of the posterior leaflet. There was moderate to severe regurgitation. - Left atrium: The atrium was moderately dilated. - Right ventricle: The cavity size was normal. Systolic function was normal. - Right atrium: The atrium was moderately to severely dilated. - Tricuspid valve: There was moderate regurgitation. Peak RV-RA gradient 39 mmHg. - Pulmonary arteries: PA peak pressure: 47 mm Hg (S). - Systemic veins: IVC measured 2.5 cm with > 50% respirophasic variation, suggesting RA pressure 8 mmHg.  Impressions:  - Normal LV size with mild LV hypertrophy. EF 45% with mid to apical inferior hypokinesis and hypokinesis of the apex. Moderate diastolic dysfunction. Normal RV size and systolic  function. Biatrial enlargement. Moderate to severe mitral regurgitation with restriction of the posterior leaflet. Mild aortic stenosis with moderate eccentric regurgitation. Mild pulmonary hypertension.   Cardiac cath/PCI: 08/21/18: CORONARY STENT INTERVENTION  LEFT HEART CATH AND CORONARY ANGIOGRAPHY  Conclusion     Prox RCA lesion is 95% stenosed.  Ost Cx lesion is 60% stenosed.  Prox Cx  lesion is 60% stenosed.  Prox LAD lesion is 100% stenosed.  A drug-eluting stent was successfully placed.  Post intervention, there is a 0% residual stenosis.  There is moderate left ventricular systolic dysfunction.  LV end diastolic pressure is mildly elevated.  The left ventricular ejection fraction is 35-45% by visual estimate.      ASSESSMENT AND PLAN:  1.  Acute on chronic diastolic CHF: Most recent labs that were drawn by primary care physician on 01/27/2021 revealed proBNP of 5244, creatinine 1.2, GFR 57.4.  He was not found to be anemic.  Potassium 4.7.  I am going to increase his Lasix from 20 mg daily to 40 mg daily, add low-dose lisinopril 2.5 mg daily.  We will order an echocardiogram for changes in LV function, left atrial size, and also to evaluate mitral valve.  He will have a follow-up BMET prior to follow-up appointment.  He is to avoid salty foods.  2.  Hypertension: As above we will add low-dose lisinopril 2.5 mg daily, continue him on amlodipine, and monitor BMET.  I did note that in the past on losartan he did have some hyperkalemia.  With higher dose of Lasix this may even out however I will check a BMET for close follow-up to make sure this is not an issue.  No potassium replacement will be provided at this time.  He is to again avoid salty foods.  3.  Atrial flutter: History of atrial fibrillation, atrial flutter noted on EKG today which may be contributing to his symptoms of fatigue and shortness of breath.  Prior to discussing a cardioversion will  evaluate more closely with echocardiogram to have better evaluation of left atrial size.  He will continue on Xarelto as directed.  There was no evidence of anemia seen on his lab work.  Heart rate is well controlled.  We will discuss on follow-up need to proceed with cardioversion.  4.  Abnormal LFTs: Being followed by primary care physician with a CT scan of the abdomen planned.  5.  Hypothyroidism: Thyroid labs are normal per review of labs per Dr. Forde Dandy.  6.  Myasthenia gravis: Patient states she is in remission currently and does not have any symptoms of numbness or weakness in his hands, face, and is able to do voluntary muscle movements without difficulty.  Current medicines are reviewed at length with the patient today.  I have spent 30 mins dedicated to the care of this patient on the date of this encounter to include pre-visit review of records, assessment, management and diagnostic testing,with shared decision making.  Labs/ tests ordered today include: BMET, echocardiogram Phill Myron. [place] Pugh, ANP, [facility]   01/27/2021 9:14 AM    [facility] [address] Office [phone] Fax [phone]  Notice: This dictation was prepared with Dragon dictation along with smaller phrase technology. Any transcriptional errors that result from this process are unintentional and may not be corrected upon review.

## 2021-01-26 NOTE — Progress Notes (Signed)
Cardiology Office Note   Date:  01/27/2021   ID:  Marcus [name] Sr., DOB 05/10/1932, MRN [id number]  PCP:  [name], MD  Cardiologist: Dr. Martinique CC: Weight gain and edema    History of Present Illness: Marcus MANGUAL Sr. is a 87 y.o. male who presents for ongoing assessment and management of CAD, with cardiac catheterization 08/21/2018 with intervention of the RCA with a drug-eluting stent.  He did have residual total LAD that Dr. Alvester Chou attempted to cross but it appeared that the wire got subintimal therefore the procedure was aborted.  He was found to have a 60% stenotic circumflex., remote PAF in the setting of pneumonia, with history of myasthenia gravis.Echocardiogram showed an EF of 40% with moderate to severe MR.  The patient was seen in the ED on 03/01/2020 after experiencing dyspnea and left arm pain while walking to his mailbox.  He had previously been very active walking 30 minutes a day.  He was found to be in atrial flutter with 4 1 conduction on arrival to ED and his rate was controlled.  He was also noted to be severely hypertensive.  He was hyperkalemic with a potassium of 5.8.  He was to be admitted but the patient refused.    Treatment included cessation of losartan and institution of amlodipine for blood pressure control.  He was also placed on Xarelto.  At that time his echo was also repeated which showed improvement of LV function to a normal EF with only mild MR.  However he did have severe biatrial enlargement.   After anticoagulation for 4 weeks he underwent a successful DCCV on 03/24/2020.  He was placed on Lasix 20 mg daily with significant improvement in fluid retention and blood pressure.  On follow-up the patient was found to be in normal sinus rhythm on March 29, 2020.  Of note, he is intolerant to Brilinta, and statins.  He was last seen in the office by Dr. Martinique on 12/12/2020 at which time he complained of not feeling well.  This occured when he was standing  for very long periods of time causing his legs to go numb causing him to feel weak.  He also complained of daily chills without fever.  On 01/24/2021 the patient called our office to report a 10 pound weight gain occurring over 1 week's time with lower extremity edema.  He was also endorsed dyspnea on exertion.  Apparently Lasix had been discontinued at some point and he did not have any.  He is here for follow-up to evaluate his symptoms.   He saw his primary care physician Dr. Forde Dandy who drew some labs which showed an elevated BNP 5244.  He was started back on Lasix 20 mg daily which can he has been on for 48 hours and has not noticed any changes in his weight or fluid retention.  He states that he used to walk a lot but has had to stop doing so and is now short of breath just walking across his living room.  He denies any PND or orthopnea.  He admits to eating a lot of salted popcorn over the last week or so.  Most recent labs on additional review did also reveal some elevation in ALT at 94 and AST at 55.  He is not on any statin therapy.  Dr. Charleen Kirks is ordering a CT scan of his abdomen and pelvis to evaluate liver and for any other abnormalities.   Past Medical History:  Diagnosis  Date  . Acute on chronic combined systolic and diastolic CHF (congestive heart failure) ([place]) 08/22/2018  . Acute on chronic respiratory failure ([place]) 04/15/2015  . CAD in native artery 08/22/2018  . CAP (community acquired pneumonia) 04/13/2015   See cxr 04/13/2015 > admit [facility]    . Complication of anesthesia   . H/O hiatal hernia   . Hyperlipidemia LDL goal <70 08/22/2018  . Hypertension 08/22/2018  . Hypothyroidism 04/13/2015  . Myasthenia gravis ([place]) 04/13/2015  . Obesity 04/13/2015  . Paroxysmal A-fib ([place]) 04/16/2015  . S/P angioplasty with stent 08/21/18 DES to RCA 08/22/2018  . Sepsis ([place]) 04/15/2015    Past Surgical History:  Procedure Laterality Date  . AMPUTATION  06/11/2012   Procedure: AMPUTATION  DIGIT;  Surgeon: [name], MD;  Location: [facility];  Service: Orthopedics;  Laterality: Left;  revision of amputation  . CARDIOVERSION N/A 03/24/2020   Procedure: CARDIOVERSION;  Surgeon: [name], [name], MD;  Location: [facility] ENDOSCOPY;  Service: Cardiovascular;  Laterality: N/A;  . CORONARY STENT INTERVENTION N/A 08/21/2018   Procedure: CORONARY STENT INTERVENTION;  Surgeon: [name], MD;  Location: [place] CV LAB;  Service: Cardiovascular;  Laterality: N/A;  . HERNIA REPAIR  1994  . LEFT HEART CATH AND CORONARY ANGIOGRAPHY N/A 08/21/2018   Procedure: LEFT HEART CATH AND CORONARY ANGIOGRAPHY;  Surgeon: [name], MD;  Location: [place] CV LAB;  Service: Cardiovascular;  Laterality: N/A;  . ORIF SHOULDER FRACTURE  06/13/2012   Procedure: OPEN REDUCTION INTERNAL FIXATION (ORIF) SHOULDER FRACTURE;  Surgeon: [name], MD;  Location: [place];  Service: Orthopedics;  Laterality: Left;  LEFT SHOULDER OPEN GREATER TUBEROSITY ORIF  . SHOULDER CLOSED REDUCTION  06/11/2012   Procedure: CLOSED MANIPULATION SHOULDER;  Surgeon: [name], MD;  Location: [place];  Service: Orthopedics;  Laterality: Left;     Current Outpatient Medications  Medication Sig Dispense Refill  . amLODipine (NORVASC) 10 MG tablet Take 1 tablet (10 mg total) by mouth daily. 90 tablet 3  . Ascorbic Acid (VITAMIN C) 1000 MG tablet Take 1,000 mg by mouth daily.    . cholecalciferol (VITAMIN D3) 25 MCG (1000 UNIT) tablet Take 1,000 Units by mouth daily.    . furosemide (LASIX) 40 MG tablet Take 40 mg by mouth.    . levothyroxine (SYNTHROID) 137 MCG tablet Take 1 tablet by mouth daily.    Marland Kitchen lisinopril (ZESTRIL) 2.5 MG tablet Take 1 tablet (2.5 mg total) by mouth daily. 30 tablet 3  . nitroGLYCERIN (NITROSTAT) 0.4 MG SL tablet Place 1 tablet (0.4 mg total) under the tongue every 5 (five) minutes as needed for chest pain. 25 tablet 6  . sodium chloride (MURO 128) 5 % ophthalmic ointment Place 1 drop into  the right eye in the morning, at noon, in the evening, and at bedtime.    Alveda Reasons 15 MG TABS tablet TAKE 1 TABLET(15 MG) BY MOUTH DAILY WITH SUPPER 30 tablet 5   No current facility-administered medications for this visit.    Allergies:   Beta adrenergic blockers and Statins    Social History:  The patient  reports that he quit smoking about 39 years ago. His smoking use included cigarettes. He has a 30.00 pack-year smoking history. He has never used smokeless tobacco. He reports current alcohol use of about 2.0 standard drinks of alcohol per week. He reports that he does not use drugs.   Family History:  The patient's family history includes Healthy in  his daughter, sister, and son; Heart attack in his father; Other in his mother.    ROS: All other systems are reviewed and negative. Unless otherwise mentioned in H&P    PHYSICAL EXAM: VS:  BP (!) 166/80 (BP Location: Left Arm, Patient Position: Sitting, Cuff Size: Normal)   Pulse (!) 58   Ht 5\' 8"  (1.727 m)   Wt 168 lb 9.6 oz (76.5 kg)   SpO2 96%   BMI 25.64 kg/m  , BMI Body mass index is 25.64 kg/m. GEN: Well nourished, well developed, in no acute distress HEENT: normal Neck: no JVD, carotid bruits, or masses Cardiac: RRR, occasional extrasystole; 2/6 systolic murmur heard best at the apex, slight pulsation in the abdomen at the substernal border, rubs, or gallops, 2+ pitting edema in ankles with 1+ pretibial edema  Respiratory:  Clear to auscultation bilaterally, normal work of breathing GI: soft, nontender, nondistended, + BS MS: no deformity or atrophy Skin: warm and dry, no rash Neuro:  Strength and sensation are intact, hard of hearing Psych: euthymic mood, full affect   EKG: Atrial flutter with 2-1 and 3-1 conduction, heart rate of 51 bpm, with right bundle branch block, left anterior fascicular block, with LVH.  (Personally reviewed)  Recent Labs: 03/01/2020: Hemoglobin 11.1; Magnesium 2.3; Platelets 120 04/07/2020:  BUN 20; Creatinine, Ser 1.28; Potassium 4.6; Sodium 134    Lipid Panel    Component Value Date/Time   CHOL 128 06/05/2019 0820   TRIG 109 06/05/2019 0820   HDL 41 06/05/2019 0820   CHOLHDL 3.1 06/05/2019 0820   CHOLHDL 2.8 08/21/2018 0924   VLDL 11 08/21/2018 0924   LDLCALC 67 06/05/2019 0820      Wt Readings from Last 3 Encounters:  01/27/21 168 lb 9.6 oz (76.5 kg)  12/19/20 158 lb (71.7 kg)  12/12/20 160 lb (72.6 kg)      Other studies Reviewed: Echo 08/21/18: Study Conclusions  - Left ventricle: The cavity size was normal. Wall thickness was increased in a pattern of mild LVH. Mid to apical inferior hypokinesis. Hypokinesis of the apex. The estimated ejection fraction was 45%. Features are consistent with a pseudonormal left ventricular filling pattern, with concomitant abnormal relaxation and increased filling pressure (grade 2 diastolic dysfunction). - Aortic valve: Trileaflet; moderately calcified leaflets. There was mild stenosis. There was moderate eccentric regurgitation. Mean gradient (S): 10 mm Hg. Valve area (VTI): 1.73 cm^2. - Mitral valve: Moderately calcified annulus. Mildly calcified leaflets. There is some degree of restriction of the posterior leaflet. There was moderate to severe regurgitation. - Left atrium: The atrium was moderately dilated. - Right ventricle: The cavity size was normal. Systolic function was normal. - Right atrium: The atrium was moderately to severely dilated. - Tricuspid valve: There was moderate regurgitation. Peak RV-RA gradient 39 mmHg. - Pulmonary arteries: PA peak pressure: 47 mm Hg (S). - Systemic veins: IVC measured 2.5 cm with > 50% respirophasic variation, suggesting RA pressure 8 mmHg.  Impressions:  - Normal LV size with mild LV hypertrophy. EF 45% with mid to apical inferior hypokinesis and hypokinesis of the apex. Moderate diastolic dysfunction. Normal RV size and systolic  function. Biatrial enlargement. Moderate to severe mitral regurgitation with restriction of the posterior leaflet. Mild aortic stenosis with moderate eccentric regurgitation. Mild pulmonary hypertension.   Cardiac cath/PCI: 08/21/18: CORONARY STENT INTERVENTION  LEFT HEART CATH AND CORONARY ANGIOGRAPHY  Conclusion     Prox RCA lesion is 95% stenosed.  Ost Cx lesion is 60% stenosed.  Prox Cx  lesion is 60% stenosed.  Prox LAD lesion is 100% stenosed.  A drug-eluting stent was successfully placed.  Post intervention, there is a 0% residual stenosis.  There is moderate left ventricular systolic dysfunction.  LV end diastolic pressure is mildly elevated.  The left ventricular ejection fraction is 35-45% by visual estimate.      ASSESSMENT AND PLAN:  1.  Acute on chronic diastolic CHF: Most recent labs that were drawn by primary care physician on 01/27/2021 revealed proBNP of 5244, creatinine 1.2, GFR 57.4.  He was not found to be anemic.  Potassium 4.7.  I am going to increase his Lasix from 20 mg daily to 40 mg daily, add low-dose lisinopril 2.5 mg daily.  We will order an echocardiogram for changes in LV function, left atrial size, and also to evaluate mitral valve.  He will have a follow-up BMET prior to follow-up appointment.  He is to avoid salty foods.  2.  Hypertension: As above we will add low-dose lisinopril 2.5 mg daily, continue him on amlodipine, and monitor BMET.  I did note that in the past on losartan he did have some hyperkalemia.  With higher dose of Lasix this may even out however I will check a BMET for close follow-up to make sure this is not an issue.  No potassium replacement will be provided at this time.  He is to again avoid salty foods.  3.  Atrial flutter: History of atrial fibrillation, atrial flutter noted on EKG today which may be contributing to his symptoms of fatigue and shortness of breath.  Prior to discussing a cardioversion will  evaluate more closely with echocardiogram to have better evaluation of left atrial size.  He will continue on Xarelto as directed.  There was no evidence of anemia seen on his lab work.  Heart rate is well controlled.  We will discuss on follow-up need to proceed with cardioversion.  4.  Abnormal LFTs: Being followed by primary care physician with a CT scan of the abdomen planned.  5.  Hypothyroidism: Thyroid labs are normal per review of labs per Dr. Forde Dandy.  6.  Myasthenia gravis: Patient states she is in remission currently and does not have any symptoms of numbness or weakness in his hands, face, and is able to do voluntary muscle movements without difficulty.  Current medicines are reviewed at length with the patient today.  I have spent 30 mins dedicated to the care of this patient on the date of this encounter to include pre-visit review of records, assessment, management and diagnostic testing,with shared decision making.  Labs/ tests ordered today include: BMET, echocardiogram Phill Myron. West Pugh, ANP, [facility]   01/27/2021 9:14 AM    [facility] [place] [address] Office [phone] Fax [phone]  Notice: This dictation was prepared with Dragon dictation along with smaller phrase technology. Any transcriptional errors that result from this process are unintentional and may not be corrected upon review.

## 2021-01-27 ENCOUNTER — Encounter: Payer: Self-pay | Admitting: Adult Health

## 2021-01-27 ENCOUNTER — Ambulatory Visit (INDEPENDENT_AMBULATORY_CARE_PROVIDER_SITE_OTHER): Payer: Medicare Other | Admitting: Adult Health

## 2021-01-27 ENCOUNTER — Other Ambulatory Visit: Payer: Self-pay

## 2021-01-27 VITALS — BP 166/80 | HR 58 | Ht 68.0 in | Wt 168.6 lb

## 2021-01-27 DIAGNOSIS — I4892 Unspecified atrial flutter: Secondary | ICD-10-CM

## 2021-01-27 DIAGNOSIS — I519 Heart disease, unspecified: Secondary | ICD-10-CM | POA: Diagnosis not present

## 2021-01-27 DIAGNOSIS — Z79899 Other long term (current) drug therapy: Secondary | ICD-10-CM | POA: Diagnosis not present

## 2021-01-27 DIAGNOSIS — I2510 Atherosclerotic heart disease of native coronary artery without angina pectoris: Secondary | ICD-10-CM

## 2021-01-27 MED ORDER — NITROGLYCERIN 0.4 MG SL SUBL: 0.4000 mg | SUBLINGUAL_TABLET | SUBLINGUAL | 6 refills | Status: DC | PRN

## 2021-01-27 MED ORDER — LISINOPRIL 2.5 MG PO TABS: 2.5000 mg | ORAL_TABLET | Freq: Every day | ORAL | 3 refills | Status: DC

## 2021-01-27 NOTE — Patient Instructions (Signed)
.  Medication Instructions:  START LISINOPRIL 2.5MG  DAILY *If you need a refill on your cardiac medications before your next appointment, please call your pharmacy*  Lab Work: BMET ON [facility] ECHO If you have labs (blood work) drawn today and your tests are completely normal, you will receive your results only by:  [place] (if you have MyChart) OR A paper copy in the mail.  If you have any lab test that is abnormal or we need to change your treatment, we will call you to review the results. You may go to any [facility] that is convenient for you however, we do have a lab in our office that is able to assist you. You DO NOT need an appointment for our lab. The lab is open 8:00am and closes at 4:00pm. Lunch 12:45 - 1:45pm.  Testing/Procedures: Echocardiogram - Your physician has requested that you have an echocardiogram. Echocardiography is a painless test that uses sound waves to create images of your heart. It provides your doctor with information about the size and shape of your heart and how well your heart's chambers and valves are working. This procedure takes approximately one hour. There are no restrictions for this procedure. This will be performed at our [facility] location - [address].  Special Instructions PLEASE READ AND FOLLOW SALTY 6-ATTACHED-1,800 mg daily  Follow-Up: Your next appointment:  AFTER ECHO  In Person with Peter Martinique, MD OR IF UNAVAILABLE Jory Sims, FNP-C  At [facility], you and your health needs are our priority.  As part of our continuing mission to provide you with exceptional heart care, we have created designated Provider Care Teams.  These Care Teams include your primary Cardiologist (physician) and Advanced Practice Providers (APPs -  Physician Assistants and Nurse Practitioners) who all work together to provide you with the care you need, when you need it.

## 2021-01-30 NOTE — Telephone Encounter (Signed)
Would check sats at rest and with walking and if over 88% would not qualify for 02 - because he has an acute change in sob he probably wouldn't qualify anyway and if getting worse awaiting cardiac eval then should go to ER, not to try to solve the problem with 02 which would just be like putting on a bandaid for a serious bleed.

## 2021-01-30 NOTE — Telephone Encounter (Signed)
Per pt:   I saw Dr Melvyn Novas in [place]  a few weeks ago.My walking oxygen level was 97 then and checks around that level all the time ..I have been diagnosed with Congestive heart failure with a BNP score of 5000.I find the least effort now makes me short of breath and wondered if while I am waiting a month to get an echocardiogram and other tests if I would not be more comfortable if I had an inogen oxygen condenser.I planned to just purchase one on my own and wanted to make sure that using one would not be detrimental to my lungs? Thank you,RB I will send my lab result from last Wednesday in just a few minutes.

## 2021-01-30 NOTE — Telephone Encounter (Signed)
FYI:   Collection Date: 01/25/2021 10:55:00  Received: 01/26/2021 06:07:12  Report: 01/25/2021  10:55:00  Requesting Physician:South, Annie Main          Ordering Physician: [name]       BNP pro - TPB NAME          VALUE         REFERENCE RANGE F        NT-proBNP  5,244  H       0-486 (pg/mL) -                The following cut-points have been suggested for the -                use of proBNP for the diagnostic evaluation of heart -                failure (HF) in patients with acute dyspnea: -                                                                     . -                Modality                     Age           Optimal Cut -                                           (years)            Point -                ------------------------------------------------------ -                Diagnosis (rule in HF)        <50            450 pg/mL -                                          50 - 75            900 pg/mL -                                              >75           1800 pg/mL -                Exclusion (rule out HF)  Age independent     300 pg/mL

## 2021-01-30 NOTE — Telephone Encounter (Signed)
Would check sats walking - if over 88% would not qualify  If less than this would need first to have the reason explored/ corrected and I agree with Dr Forde Dandy that sounds cardiac and should be seen ASAP or go to ER if getting worse awaiting appt, esp if any worsening  orthopnea.

## 2021-01-30 NOTE — Telephone Encounter (Signed)
Please advise on pt email. I did let him know OV would be needed if he is requesting o2. He would need to qualify. No [place] openings this wk.   Oman, Markon Pamala Hurry, MD 17 hours ago (5:26 PM)      Dr Melvyn Novas: since seeing you 3 weeks ago in your [place] office I suddenly became unable to walk from my den to the kitchen without being short of breath.one week prior to that occurring I had been walking 30 minutes a day fairly briskly without any problem.While waiting to have an echocardiogram  which is not available till 5/25 would it be helpful  for me to have an oxygen condenser inogen makes?   T-proBNP    5,244  H       0-486 (pg/mL) -                The following cut-points have been suggested for the -                use of proBNP for the diagnostic evaluation of heart -                failure (HF) in patients with acute dyspnea: -                                                                     . -                Modality                     Age           Optimal Cut -                                           (years)            Point -                ------------------------------------------------------ -                Diagnosis (rule in HF)        <50            450 pg/mL -                                          50 - 75            900 pg/mL -                                              >75           1800 pg/mL -                Exclusion (rule out HF)  Age independent     300 pg/mL -  Result:  Accession ID:        [id number] Notes:          Sheela Stack 01/26/2021 01:58:23 PM > heart failure test is way up, check back with CARDS (send note and labs to [place] [place]) Carlis Abbott, Eugene Garnet 01/27/2021 01:06:40 PM > LMTC. Gerre Pebbles 01/27/2021 02:22:22 PM > pt notified. labs and note faxed.

## 2021-02-01 DIAGNOSIS — H02131 Senile ectropion of right upper eyelid: Secondary | ICD-10-CM | POA: Diagnosis not present

## 2021-02-01 DIAGNOSIS — H02134 Senile ectropion of left upper eyelid: Secondary | ICD-10-CM | POA: Diagnosis not present

## 2021-02-01 DIAGNOSIS — H18231 Secondary corneal edema, right eye: Secondary | ICD-10-CM | POA: Diagnosis not present

## 2021-02-01 DIAGNOSIS — Z947 Corneal transplant status: Secondary | ICD-10-CM | POA: Diagnosis not present

## 2021-02-01 DIAGNOSIS — Z961 Presence of intraocular lens: Secondary | ICD-10-CM | POA: Diagnosis not present

## 2021-02-02 DIAGNOSIS — Z79899 Other long term (current) drug therapy: Secondary | ICD-10-CM | POA: Diagnosis not present

## 2021-02-02 DIAGNOSIS — I519 Heart disease, unspecified: Secondary | ICD-10-CM | POA: Diagnosis not present

## 2021-02-02 DIAGNOSIS — I4892 Unspecified atrial flutter: Secondary | ICD-10-CM | POA: Diagnosis not present

## 2021-02-02 NOTE — Telephone Encounter (Signed)
Called pt to schedule an appt for tomorrow but, pt was able to move up his ECHO to tomorrow @ 2pm. There are not any  Appointments later in the afternoon.

## 2021-02-03 ENCOUNTER — Ambulatory Visit (HOSPITAL_COMMUNITY): Payer: Medicare Other | Attending: Internal Medicine

## 2021-02-03 ENCOUNTER — Other Ambulatory Visit: Payer: Self-pay

## 2021-02-03 DIAGNOSIS — I5022 Chronic systolic (congestive) heart failure: Secondary | ICD-10-CM | POA: Diagnosis not present

## 2021-02-03 DIAGNOSIS — R5383 Other fatigue: Secondary | ICD-10-CM | POA: Diagnosis not present

## 2021-02-03 DIAGNOSIS — Z87891 Personal history of nicotine dependence: Secondary | ICD-10-CM | POA: Diagnosis not present

## 2021-02-03 DIAGNOSIS — J984 Other disorders of lung: Secondary | ICD-10-CM | POA: Diagnosis not present

## 2021-02-03 DIAGNOSIS — I519 Heart disease, unspecified: Secondary | ICD-10-CM

## 2021-02-03 DIAGNOSIS — I214 Non-ST elevation (NSTEMI) myocardial infarction: Secondary | ICD-10-CM | POA: Diagnosis not present

## 2021-02-03 DIAGNOSIS — I4892 Unspecified atrial flutter: Secondary | ICD-10-CM | POA: Insufficient documentation

## 2021-02-03 DIAGNOSIS — I4819 Other persistent atrial fibrillation: Secondary | ICD-10-CM | POA: Diagnosis not present

## 2021-02-03 LAB — BASIC METABOLIC PANEL
BUN/Creatinine Ratio: 22 (ref 10–24)
BUN: 30 mg/dL — ABNORMAL HIGH (ref 8–27)
CO2: 23 mmol/L (ref 20–29)
Calcium: 9.1 mg/dL (ref 8.6–10.2)
Chloride: 102 mmol/L (ref 96–106)
Creatinine, Ser: 1.39 mg/dL — ABNORMAL HIGH (ref 0.76–1.27)
Glucose: 100 mg/dL — ABNORMAL HIGH (ref 65–99)
Potassium: 5.3 mmol/L — ABNORMAL HIGH (ref 3.5–5.2)
Sodium: 137 mmol/L (ref 134–144)
eGFR: 49 mL/min/{1.73_m2} — ABNORMAL LOW (ref >59)

## 2021-02-03 LAB — ECHOCARDIOGRAM COMPLETE
AR max vel: 1.82 cm2
AV Area VTI: 1.7 cm2
AV Area mean vel: 1.8 cm2
AV Mean grad: 15.3 mmHg
AV Peak grad: 27.4 mmHg
Ao pk vel: 2.62 m/s
Area-P 1/2: 4.39 cm2
P 1/2 time: 623 msec
S' Lateral: 3.1 cm

## 2021-02-06 ENCOUNTER — Other Ambulatory Visit: Payer: Self-pay

## 2021-02-06 NOTE — Telephone Encounter (Signed)
He needs to stop lisinopril due to hyperkalemia. His Echo looked good with normal LV function. No significant valvular abnormality. I  suspect he has diastolic CHF related to his new onset atrial flutter. I would increase lasix to 40 mg bid. Providing he hasn't missed any Xarelto doses I think we should proceed with cardioversion to restore NSR.  Yanelle Sousa Martinique MD, [facility]

## 2021-02-06 NOTE — Telephone Encounter (Signed)
Spoke to patient Dr.[name]'s advice given.Stated he is taking Lasix 20 mg daily.Dr.[name] advised to take 40 mg daily.Stop Lisinopril.Stated he has not missed taking any doses of Xarelto.Cardioversion scheduled 02/14/21 at 9:00 am with Dr.Schumann.He will pick up instructions and have pre procedure labs 02/10/21 at 8:00 am.

## 2021-02-07 ENCOUNTER — Other Ambulatory Visit (HOSPITAL_COMMUNITY): Payer: Medicare Other

## 2021-02-07 ENCOUNTER — Telehealth: Payer: Self-pay | Admitting: Cardiology

## 2021-02-07 NOTE — Telephone Encounter (Signed)
Patient was returning call to talk to Dr. Doug Sou nurse. Said that he would be willing to talk with any nurse to see what's going on.

## 2021-02-07 NOTE — Telephone Encounter (Signed)
Returned the call to the patient. He stated that he cannot come on Friday for lab work for his cardioversion. He will come on Thursday instead. He will come to the [place] office. Instructions given.    Covid test is Friday the 13th.

## 2021-02-08 ENCOUNTER — Telehealth: Payer: Self-pay

## 2021-02-08 DIAGNOSIS — I4819 Other persistent atrial fibrillation: Secondary | ICD-10-CM

## 2021-02-08 DIAGNOSIS — Z01812 Encounter for preprocedural laboratory examination: Secondary | ICD-10-CM

## 2021-02-08 NOTE — Telephone Encounter (Signed)
  You are scheduled for a Cardioversion on Tuesday 02/14/21 with Dr.Schumann. Please arrive at the [facility] (Main Entrance A) at [facility]: [address] [place], [place] [address] at 8:00 am.  DIET: Nothing to eat or drink after midnight except a sip of water with medications (see medication instructions below)  Medication Instructions: Hold Furosemide morning of procedure  Continue your anticoagulant: Xarelto You will need to continue your anticoagulant after your procedure until you  are told by your  Provider that it is safe to stop   Labs: Bmet,cbc to done Friday 02/10/21 at [place] office.  Covid test to be done Friday 02/10/21 at 1:10 pm at [facility] until after Cardioversion.   You must have a responsible person to drive you home and stay in the waiting area during your procedure. Failure to do so could result in cancellation.  Bring your insurance cards.  *Special Note: Every effort is made to have your procedure done on time. Occasionally there are emergencies that occur at the hospital that may cause delays. Please be patient if a delay does occur.

## 2021-02-09 ENCOUNTER — Other Ambulatory Visit (HOSPITAL_COMMUNITY): Payer: Medicare Other

## 2021-02-09 DIAGNOSIS — Z01812 Encounter for preprocedural laboratory examination: Secondary | ICD-10-CM | POA: Diagnosis not present

## 2021-02-09 DIAGNOSIS — I4819 Other persistent atrial fibrillation: Secondary | ICD-10-CM | POA: Diagnosis not present

## 2021-02-09 LAB — BASIC METABOLIC PANEL
BUN/Creatinine Ratio: 24 (ref 10–24)
BUN: 29 mg/dL — ABNORMAL HIGH (ref 8–27)
CO2: 22 mmol/L (ref 20–29)
Calcium: 8.8 mg/dL (ref 8.6–10.2)
Chloride: 102 mmol/L (ref 96–106)
Creatinine, Ser: 1.22 mg/dL (ref 0.76–1.27)
Glucose: 91 mg/dL (ref 65–99)
Potassium: 4.9 mmol/L (ref 3.5–5.2)
Sodium: 139 mmol/L (ref 134–144)
eGFR: 58 mL/min/{1.73_m2} — ABNORMAL LOW (ref >59)

## 2021-02-10 ENCOUNTER — Other Ambulatory Visit: Payer: Self-pay

## 2021-02-10 ENCOUNTER — Other Ambulatory Visit: Payer: Self-pay | Admitting: Cardiology

## 2021-02-10 ENCOUNTER — Other Ambulatory Visit (HOSPITAL_COMMUNITY)
Admission: RE | Admit: 2021-02-10 | Discharge: 2021-02-10 | Disposition: A | Discharge status: Home / Self Care | Payer: Medicare Other | Source: Ambulatory Visit | Attending: Cardiology | Admitting: Cardiology

## 2021-02-10 ENCOUNTER — Ambulatory Visit
Admission: RE | Admit: 2021-02-10 | Discharge: 2021-02-10 | Disposition: A | Discharge status: Home / Self Care | Payer: Medicare Other | Source: Ambulatory Visit | Attending: Endocrinology | Admitting: Endocrinology

## 2021-02-10 ENCOUNTER — Ambulatory Visit: Payer: Medicare Other | Admitting: Physician Assistant

## 2021-02-10 DIAGNOSIS — Z01812 Encounter for preprocedural laboratory examination: Secondary | ICD-10-CM | POA: Insufficient documentation

## 2021-02-10 DIAGNOSIS — R7989 Other specified abnormal findings of blood chemistry: Secondary | ICD-10-CM | POA: Diagnosis not present

## 2021-02-10 DIAGNOSIS — J90 Pleural effusion, not elsewhere classified: Secondary | ICD-10-CM | POA: Diagnosis not present

## 2021-02-10 DIAGNOSIS — D7389 Other diseases of spleen: Secondary | ICD-10-CM | POA: Diagnosis not present

## 2021-02-10 DIAGNOSIS — Z20822 Contact with and (suspected) exposure to covid-19: Secondary | ICD-10-CM | POA: Insufficient documentation

## 2021-02-10 DIAGNOSIS — I714 Abdominal aortic aneurysm, without rupture: Secondary | ICD-10-CM | POA: Diagnosis not present

## 2021-02-10 LAB — CBC WITH DIFFERENTIAL/PLATELET
Basophils Absolute: 0.1 10*3/uL (ref 0.0–0.2)
Basos: 1 %
EOS (ABSOLUTE): 0.2 10*3/uL (ref 0.0–0.4)
Eos: 3 %
Hematocrit: 37.3 % — ABNORMAL LOW (ref 37.5–51.0)
Hemoglobin: 12.8 g/dL — ABNORMAL LOW (ref 13.0–17.7)
Immature Grans (Abs): 0 10*3/uL (ref 0.0–0.1)
Immature Granulocytes: 1 %
Lymphocytes Absolute: 1 10*3/uL (ref 0.7–3.1)
Lymphs: 17 %
MCH: 32.8 pg (ref 26.6–33.0)
MCHC: 34.3 g/dL (ref 31.5–35.7)
MCV: 96 fL (ref 79–97)
Monocytes Absolute: 0.5 10*3/uL (ref 0.1–0.9)
Monocytes: 8 %
Neutrophils Absolute: 4 10*3/uL (ref 1.4–7.0)
Neutrophils: 70 %
Platelets: 133 10*3/uL — ABNORMAL LOW (ref 150–450)
RBC: 3.9 x10E6/uL — ABNORMAL LOW (ref 4.14–5.80)
RDW: 13.5 % (ref 11.6–15.4)
WBC: 5.7 10*3/uL (ref 3.4–10.8)

## 2021-02-10 MED ORDER — IOPAMIDOL (ISOVUE-300) INJECTION 61%
100.0000 mL | Freq: Once | INTRAVENOUS | Status: AC | PRN
  Administered 2021-02-10: 100 mL via INTRAVENOUS

## 2021-02-11 LAB — SARS CORONAVIRUS 2 (TAT 6-24 HRS): SARS Coronavirus 2: NEGATIVE

## 2021-02-13 ENCOUNTER — Ambulatory Visit: Payer: Medicare Other

## 2021-02-14 ENCOUNTER — Ambulatory Visit (HOSPITAL_COMMUNITY)
Admission: RE | Admit: 2021-02-14 | Discharge: 2021-02-14 | Disposition: A | Discharge status: Home / Self Care | Payer: Medicare Other | Attending: Cardiology | Admitting: Cardiology

## 2021-02-14 ENCOUNTER — Encounter (HOSPITAL_COMMUNITY)
Admission: RE | Disposition: A | Discharge status: Home / Self Care | Payer: Medicare Other | Source: Home / Self Care | Attending: Cardiology

## 2021-02-14 ENCOUNTER — Ambulatory Visit (HOSPITAL_COMMUNITY): Payer: Medicare Other | Admitting: Anesthesiology

## 2021-02-14 ENCOUNTER — Encounter (HOSPITAL_COMMUNITY): Payer: Self-pay | Admitting: Cardiology

## 2021-02-14 ENCOUNTER — Other Ambulatory Visit: Payer: Self-pay

## 2021-02-14 DIAGNOSIS — I214 Non-ST elevation (NSTEMI) myocardial infarction: Secondary | ICD-10-CM | POA: Diagnosis not present

## 2021-02-14 DIAGNOSIS — Z79899 Other long term (current) drug therapy: Secondary | ICD-10-CM | POA: Diagnosis not present

## 2021-02-14 DIAGNOSIS — Z87891 Personal history of nicotine dependence: Secondary | ICD-10-CM | POA: Insufficient documentation

## 2021-02-14 DIAGNOSIS — Z8249 Family history of ischemic heart disease and other diseases of the circulatory system: Secondary | ICD-10-CM | POA: Diagnosis not present

## 2021-02-14 DIAGNOSIS — I4819 Other persistent atrial fibrillation: Secondary | ICD-10-CM | POA: Diagnosis not present

## 2021-02-14 DIAGNOSIS — Z8619 Personal history of other infectious and parasitic diseases: Secondary | ICD-10-CM | POA: Insufficient documentation

## 2021-02-14 DIAGNOSIS — I110 Hypertensive heart disease with heart failure: Secondary | ICD-10-CM | POA: Insufficient documentation

## 2021-02-14 DIAGNOSIS — E669 Obesity, unspecified: Secondary | ICD-10-CM | POA: Diagnosis not present

## 2021-02-14 DIAGNOSIS — D696 Thrombocytopenia, unspecified: Secondary | ICD-10-CM | POA: Diagnosis not present

## 2021-02-14 DIAGNOSIS — Z955 Presence of coronary angioplasty implant and graft: Secondary | ICD-10-CM | POA: Insufficient documentation

## 2021-02-14 DIAGNOSIS — Z888 Allergy status to other drugs, medicaments and biological substances status: Secondary | ICD-10-CM | POA: Diagnosis not present

## 2021-02-14 DIAGNOSIS — I480 Paroxysmal atrial fibrillation: Secondary | ICD-10-CM | POA: Diagnosis not present

## 2021-02-14 DIAGNOSIS — I5033 Acute on chronic diastolic (congestive) heart failure: Secondary | ICD-10-CM | POA: Insufficient documentation

## 2021-02-14 DIAGNOSIS — G7000 Myasthenia gravis without (acute) exacerbation: Secondary | ICD-10-CM | POA: Insufficient documentation

## 2021-02-14 DIAGNOSIS — I4892 Unspecified atrial flutter: Secondary | ICD-10-CM | POA: Diagnosis not present

## 2021-02-14 DIAGNOSIS — I2510 Atherosclerotic heart disease of native coronary artery without angina pectoris: Secondary | ICD-10-CM | POA: Insufficient documentation

## 2021-02-14 DIAGNOSIS — E875 Hyperkalemia: Secondary | ICD-10-CM | POA: Diagnosis not present

## 2021-02-14 DIAGNOSIS — E039 Hypothyroidism, unspecified: Secondary | ICD-10-CM | POA: Insufficient documentation

## 2021-02-14 DIAGNOSIS — Z7901 Long term (current) use of anticoagulants: Secondary | ICD-10-CM | POA: Insufficient documentation

## 2021-02-14 DIAGNOSIS — Z79890 Hormone replacement therapy (postmenopausal): Secondary | ICD-10-CM | POA: Diagnosis not present

## 2021-02-14 DIAGNOSIS — E7800 Pure hypercholesterolemia, unspecified: Secondary | ICD-10-CM | POA: Diagnosis not present

## 2021-02-14 HISTORY — PX: CARDIOVERSION: SHX1299

## 2021-02-14 SURGERY — CARDIOVERSION
Anesthesia: General

## 2021-02-14 MED ORDER — LIDOCAINE HCL (CARDIAC) PF 100 MG/5ML IV SOSY
PREFILLED_SYRINGE | INTRAVENOUS | Status: DC | PRN
  Administered 2021-02-14: 50 mg via INTRATRACHEAL

## 2021-02-14 MED ORDER — SODIUM CHLORIDE 0.9 % IV SOLN: INTRAVENOUS | Status: DC | PRN

## 2021-02-14 MED ORDER — PROPOFOL 10 MG/ML IV BOLUS
INTRAVENOUS | Status: DC | PRN
  Administered 2021-02-14: 50 mg via INTRAVENOUS

## 2021-02-14 NOTE — Anesthesia Preprocedure Evaluation (Addendum)
Anesthesia Evaluation  Patient identified by MRN, date of birth, ID band Patient awake    Reviewed: Allergy & Precautions, NPO status , Patient's Chart, lab work & pertinent test results, reviewed documented beta blocker date and time   History of Anesthesia Complications (+) history of anesthetic complications  Airway Mallampati: II  TM Distance: >3 FB Neck ROM: Full    Dental  (+) Poor Dentition, Caps, Dental Advisory Given, Chipped,    Pulmonary shortness of breath and with exertion, asthma , pneumonia, former smoker,    breath sounds clear to auscultation + decreased breath sounds      Cardiovascular hypertension, Pt. on medications + CAD, + Past MI, + Cardiac Stents and +CHF  + dysrhythmias Atrial Fibrillation  Rhythm:Irregular Rate:Normal  EKG 01/27/21 Atrial flutter, LVH, RBBB, LAFB  Echo 02/03/21 1. Left ventricular ejection fraction, by estimation, is 60 to 65%. The left ventricle has normal function. The left ventricle has no regional wall motion abnormalities. There is mild left ventricular hypertrophy. Left ventricular diastolic function could not be evaluated.  2. Right ventricular systolic function is normal. The right ventricular size is normal. There is normal pulmonary artery systolic pressure. The estimated right ventricular systolic pressure is 22.2 mmHg.  3. Left atrial size was moderately dilated.  4. Right atrial size was moderately dilated.  5. The mitral valve is degenerative. Mild mitral valve regurgitation. Moderate mitral annular calcification.  6. The tricuspid valve is abnormal. Tricuspid valve regurgitation is moderate.  7. The aortic valve is tricuspid. Aortic valve regurgitation is mild to moderate. Mild aortic valve stenosis. Aortic valve area, by VTI measures 1.70 cm. Aortic valve mean gradient measures 15.2 mmHg. Aortic valve Vmax  measures 2.62 m/s.  8. Aortic dilatation noted. There is mild  dilatation of the ascending aorta, measuring 40 mm.  9. The inferior vena cava is dilated in size with >50% respiratory variability, suggesting right atrial pressure of 8 mmHg.   Comparison(s): Changes from prior study are noted. 03/07/2020: LVEF 60-65%,  severe biatrial enlargement, moderate TR, mild AI.    Neuro/Psych Myasthenia gravis  Neuromuscular disease negative psych ROS   GI/Hepatic Neg liver ROS, hiatal hernia,   Endo/Other  Hypothyroidism Hyperlipidemia  Renal/GU ARFRenal disease  negative genitourinary   Musculoskeletal negative musculoskeletal ROS (+)   Abdominal   Peds  Hematology Xarelto therapy- last dose 5/16 no missed dosages   Anesthesia Other Findings   Reproductive/Obstetrics                            Anesthesia Physical Anesthesia Plan  ASA: III  Anesthesia Plan: General   Post-op Pain Management:    Induction: Intravenous  PONV Risk Score and Plan: 2 and Treatment may vary due to age or medical condition  Airway Management Planned: Natural Airway and Mask  Additional Equipment:   Intra-op Plan:   Post-operative Plan:   Informed Consent: I have reviewed the patients History and Physical, chart, labs and discussed the procedure including the risks, benefits and alternatives for the proposed anesthesia with the patient or authorized representative who has indicated his/her understanding and acceptance.     Dental advisory given  Plan Discussed with: CRNA and Anesthesiologist  Anesthesia Plan Comments:        Anesthesia Quick Evaluation

## 2021-02-14 NOTE — CV Procedure (Signed)
Procedure:   DCCV  Indication:  Symptomatic atrial flutter  Procedure Note:  The patient signed informed consent.  They have had had therapeutic anticoagulation with Xarelto greater than 3 weeks.  Anesthesia was administered by Dr. Royce Macadamia and Haze Boyden, CRNA. Adequate airway was maintained throughout and vital followed per protocol.  They were cardioverted x 1 with 100J of biphasic synchronized energy.  They converted to NSR with rate 60-70s.  There were no apparent complications.  The patient had normal neuro status and respiratory status post procedure with vitals stable as recorded elsewhere.    Follow up:  They will continue on current medical therapy and follow up with cardiology as scheduled.  Oswaldo Milian, MD 02/14/2021 8:53 AM

## 2021-02-14 NOTE — Interval H&P Note (Signed)
History and Physical Interval Note:  02/14/2021 8:31 AM  Marcus A Rohrig Sr.  has presented today for surgery, with the diagnosis of AFIB.  The various methods of treatment have been discussed with the patient and family. After consideration of risks, benefits and other options for treatment, the patient has consented to  Procedure(s): CARDIOVERSION (N/A) as a surgical intervention.  The patient's history has been reviewed, patient examined, no change in status, stable for surgery.  I have reviewed the patient's chart and labs.  Questions were answered to the patient's satisfaction.     [name]

## 2021-02-14 NOTE — Anesthesia Postprocedure Evaluation (Signed)
Anesthesia Post Note  Patient: Marcus [name].  Procedure(s) Performed: CARDIOVERSION (N/A )     Patient location during evaluation: PACU Anesthesia Type: General Level of consciousness: awake and alert and oriented Pain management: pain level controlled Vital Signs Assessment: post-procedure vital signs reviewed and stable Respiratory status: spontaneous breathing, nonlabored ventilation and respiratory function stable Cardiovascular status: blood pressure returned to baseline and stable Postop Assessment: no apparent nausea or vomiting Anesthetic complications: no   No complications documented.  Last Vitals:  Vitals:   02/14/21 0801 02/14/21 0850  BP: (!) 170/71 (!) 152/72  Pulse: (!) 54 69  Resp: 20 17  Temp: 36.6 C (!) 36.2 C  SpO2: 100% 100%    Last Pain:  Vitals:   02/14/21 0850  TempSrc: Temporal  PainSc: 0-No pain                 Isauro Skelley A.

## 2021-02-14 NOTE — Transfer of Care (Signed)
Immediate Anesthesia Transfer of Care Note  Patient: Marcus [name].  Procedure(s) Performed: CARDIOVERSION (N/A )  Patient Location: Endoscopy Unit  Anesthesia Type:General  Level of Consciousness: drowsy and patient cooperative  Airway & Oxygen Therapy: Patient Spontanous Breathing and Patient connected to face mask oxygen  Post-op Assessment: Report given to RN and Post -op Vital signs reviewed and stable  Post vital signs: Reviewed and stable  Last Vitals:  Vitals Value Taken Time  BP 157/70   Temp    Pulse 66   Resp    SpO2 100     Last Pain:  Vitals:   02/14/21 0801  TempSrc: Tympanic  PainSc: 0-No pain         Complications: No complications documented.

## 2021-02-14 NOTE — Anesthesia Procedure Notes (Signed)
Procedure Name: General with mask airway Date/Time: 02/14/2021 8:38 AM Performed by: Kathryne Hitch, CRNA Pre-anesthesia Checklist: Patient identified, Emergency Drugs available, Suction available and Patient being monitored Patient Re-evaluated:Patient Re-evaluated prior to induction Oxygen Delivery Method: Simple face mask Preoxygenation: Pre-oxygenation with 100% oxygen Induction Type: IV induction Placement Confirmation: positive ETCO2 Dental Injury: Teeth and Oropharynx as per pre-operative assessment

## 2021-02-14 NOTE — Discharge Instructions (Signed)
Electrical Cardioversion Electrical cardioversion is the delivery of a jolt of electricity to restore a normal rhythm to the heart. A rhythm that is too fast or is not regular keeps the heart from pumping well. In this procedure, sticky patches or metal paddles are placed on the chest to deliver electricity to the heart from a device. This procedure may be done in an emergency if:  There is low or no blood pressure as a result of the heart rhythm.  Normal rhythm must be restored as fast as possible to protect the brain and heart from further damage.  It may save a life. This may also be a scheduled procedure for irregular or fast heart rhythms that are not immediately life-threatening. Tell a health care provider about:  Any allergies you have.  All medicines you are taking, including vitamins, herbs, eye drops, creams, and over-the-counter medicines.  Any problems you or family members have had with anesthetic medicines.  Any blood disorders you have.  Any surgeries you have had.  Any medical conditions you have.  Whether you are pregnant or may be pregnant. What are the risks? Generally, this is a safe procedure. However, problems may occur, including:  Allergic reactions to medicines.  A blood clot that breaks free and travels to other parts of your body.  The possible return of an abnormal heart rhythm within hours or days after the procedure.  Your heart stopping (cardiac arrest). This is rare. What happens before the procedure? Medicines  Your health care provider may have you start taking: ? Blood-thinning medicines (anticoagulants) so your blood does not clot as easily. ? Medicines to help stabilize your heart rate and rhythm.  Ask your health care provider about: ? Changing or stopping your regular medicines. This is especially important if you are taking diabetes medicines or blood thinners. ? Taking medicines such as aspirin and ibuprofen. These medicines can  thin your blood. Do not take these medicines unless your health care provider tells you to take them. ? Taking over-the-counter medicines, vitamins, herbs, and supplements. General instructions  Follow instructions from your health care provider about eating or drinking restrictions.  Plan to have someone take you home from the hospital or clinic.  If you will be going home right after the procedure, plan to have someone with you for 24 hours.  Ask your health care provider what steps will be taken to help prevent infection. These may include washing your skin with a germ-killing soap. What happens during the procedure?  An IV will be inserted into one of your veins.  Sticky patches (electrodes) or metal paddles may be placed on your chest.  You will be given a medicine to help you relax (sedative).  An electrical shock will be delivered. The procedure may vary among health care providers and hospitals.   What can I expect after the procedure?  Your blood pressure, heart rate, breathing rate, and blood oxygen level will be monitored until you leave the hospital or clinic.  Your heart rhythm will be watched to make sure it does not change.  You may have some redness on the skin where the shocks were given. Follow these instructions at home:  Do not drive for 24 hours if you were given a sedative during your procedure.  Take over-the-counter and prescription medicines only as told by your health care provider.  Ask your health care provider how to check your pulse. Check it often.  Rest for 48 hours after the procedure   or as told by your health care provider.  Avoid or limit your caffeine use as told by your health care provider.  Keep all follow-up visits as told by your health care provider. This is important. Contact a health care provider if:  You feel like your heart is beating too quickly or your pulse is not regular.  You have a serious muscle cramp that does not go  away. Get help right away if:  You have discomfort in your chest.  You are dizzy or you feel faint.  You have trouble breathing or you are short of breath.  Your speech is slurred.  You have trouble moving an arm or leg on one side of your body.  Your fingers or toes turn cold or blue. Summary  Electrical cardioversion is the delivery of a jolt of electricity to restore a normal rhythm to the heart.  This procedure may be done right away in an emergency or may be a scheduled procedure if the condition is not an emergency.  Generally, this is a safe procedure.  After the procedure, check your pulse often as told by your health care provider. This information is not intended to replace advice given to you by your health care provider. Make sure you discuss any questions you have with your health care provider. Document Revised: 04/20/2019 Document Reviewed: 04/20/2019 Elsevier Patient Education  2021 [employer]vier Inc.  

## 2021-02-20 ENCOUNTER — Ambulatory Visit: Payer: Medicare Other

## 2021-02-20 ENCOUNTER — Other Ambulatory Visit: Payer: Self-pay

## 2021-02-20 ENCOUNTER — Other Ambulatory Visit: Payer: Self-pay | Admitting: Cardiology

## 2021-02-20 DIAGNOSIS — E538 Deficiency of other specified B group vitamins: Secondary | ICD-10-CM

## 2021-02-20 MED ORDER — CYANOCOBALAMIN 1000 MCG/ML IJ SOLN
1000.0000 ug | Freq: Once | INTRAMUSCULAR | Status: AC
  Administered 2021-02-20: 1000 ug via INTRAMUSCULAR

## 2021-02-20 NOTE — Telephone Encounter (Signed)
85m, 76.5kg, scr 1.22 02/09/21, lovw/lawrence 12/2920, ccr 40

## 2021-02-20 NOTE — Telephone Encounter (Signed)
Let's continue the higher lasix dose until we see in the office. Should try and restrict fluid to  1.5 liters/day  Marcus [name] Martinique MD, [facility]

## 2021-02-22 ENCOUNTER — Other Ambulatory Visit (HOSPITAL_COMMUNITY): Payer: Medicare Other

## 2021-02-22 NOTE — Progress Notes (Signed)
Cardiology Office Note   Date:  02/24/2021   ID:  Marcus Kaufmann Sr., DOB [birth year], MRN [id number]  PCP:  [name], MD  Cardiologist:  Dr. Martinique  No chief complaint on file.    History of Present Illness: Marcus CHALOUX Sr. is a 87 y.o. male who presents for follow-up after admission for cardioversion in the setting of symptomatic atrial fibrillation.  He has a history of coronary artery disease with cardiac catheterization in 08/21/2018 with intervention of the RCA with a DES.  He did have residual total LAD that Dr. Alvester Chou attempted to cross but it appeared the wire got subintimal and therefore the procedure was aborted.  The patient had had a successful cardioversion on 03/24/2020, was continued on Xarelto.    Also has a history of diastolic heart failure, has been treated with Lasix by primary care physician Dr. Forde Dandy.  He was last seen in the office on 01/27/2021 with recurrent shortness of breath fatigue.  His Lasix was increased that time to 40 mg daily and low-dose lisinopril 2.5 mg added.  I repeated his echocardiogram and discussed cardioversion with the patient.  Dr. Martinique reviewed his echo and did recommend that he be scheduled for cardioversion as he was so symptomatic.  On 02/14/2021 the patient under want biphasic synchronized cardioversion and converted to normal sinus rhythm with a rate of 60 to 70s.,  Completed by Dr. Alda Lea.  He was to continue Lasix 40 mg daily with reassessment today in the office concerning his symptoms.   He comes today feeling much better concerning his symptoms of shortness of breath and dyspnea on exertion.  However he is now complaining of dizziness and near syncope with blurred vision when he stands up.  He has not passed out.  He denies any rapid palpitations, bleeding, or significant fatigue.  Past Medical History:  Diagnosis Date  . Acute on chronic combined systolic and diastolic CHF (congestive heart failure) ([place]) 08/22/2018  . Acute  on chronic respiratory failure ([place]) 04/15/2015  . CAD in native artery 08/22/2018  . CAP (community acquired pneumonia) 04/13/2015   See cxr 04/13/2015 > admit [facility]    . Complication of anesthesia   . H/O hiatal hernia   . Hyperlipidemia LDL goal <70 08/22/2018  . Hypertension 08/22/2018  . Hypothyroidism 04/13/2015  . Myasthenia gravis ([place]) 04/13/2015  . Obesity 04/13/2015  . Paroxysmal A-fib ([place]) 04/16/2015  . S/P angioplasty with stent 08/21/18 DES to RCA 08/22/2018  . Sepsis ([place]) 04/15/2015    Past Surgical History:  Procedure Laterality Date  . AMPUTATION  06/11/2012   Procedure: AMPUTATION DIGIT;  Surgeon: [name], MD;  Location: [place];  Service: Orthopedics;  Laterality: Left;  revision of amputation  . CARDIOVERSION N/A 03/24/2020   Procedure: CARDIOVERSION;  Surgeon: [name] [name], MD;  Location[facility] ENDOSCOPY;  Service: Cardiovascular;  Laterality: N/A;  . CARDIOVERSION N/A 02/14/2021   Procedure: CARDIOVERSION;  Surgeon: [name], MD;  Location: [facility] ENDOSCOPY;  Service: Cardiovascular;  Laterality: N/A;  . CORONARY STENT INTERVENTION N/A 08/21/2018   Procedure: CORONARY STENT INTERVENTION;  Surgeon: [name], MD;  Location: [place] CV LAB;  Service: Cardiovascular;  Laterality: N/A;  . HERNIA REPAIR  1994  . LEFT HEART CATH AND CORONARY ANGIOGRAPHY N/A 08/21/2018   Procedure: LEFT HEART CATH AND CORONARY ANGIOGRAPHY;  Surgeon: [name], MD;  Location: [place] CV LAB;  Service: Cardiovascular;  Laterality: N/A;  . ORIF SHOULDER FRACTURE  06/13/2012   Procedure: OPEN REDUCTION INTERNAL FIXATION (ORIF) SHOULDER FRACTURE;  Surgeon: [name], MD;  Location: [place];  Service: Orthopedics;  Laterality: Left;  LEFT SHOULDER OPEN GREATER TUBEROSITY ORIF  . SHOULDER CLOSED REDUCTION  06/11/2012   Procedure: CLOSED MANIPULATION SHOULDER;  Surgeon: [name], MD;  Location: [place];  Service: Orthopedics;  Laterality: Left;      Current Outpatient Medications  Medication Sig Dispense Refill  . amLODipine (NORVASC) 10 MG tablet Take 1 tablet (10 mg total) by mouth daily. 90 tablet 3  . Ascorbic Acid (VITAMIN C) 1000 MG tablet Take 1,000 mg by mouth daily.    . cholecalciferol (VITAMIN D3) 25 MCG (1000 UNIT) tablet Take 1,000 Units by mouth daily.    . furosemide (LASIX) 20 MG tablet Take 1 tablet (20 mg total) by mouth daily. 90 tablet 0  . hydroxypropyl methylcellulose / hypromellose (ISOPTO TEARS / GONIOVISC) 2.5 % ophthalmic solution Place 1 drop into both eyes 4 (four) times daily as needed (dry/irritated eyes).    Marland Kitchen levothyroxine (SYNTHROID) 137 MCG tablet Take 137 mcg by mouth daily before breakfast.    . nitroGLYCERIN (NITROSTAT) 0.4 MG SL tablet Place 1 tablet (0.4 mg total) under the tongue every 5 (five) minutes as needed for chest pain. (Patient taking differently: Place 0.4 mg under the tongue every 5 (five) minutes x 3 doses as needed for chest pain.) 25 tablet 6  . XARELTO 15 MG TABS tablet TAKE 1 TABLET(15 MG) BY MOUTH DAILY WITH SUPPER 90 tablet 1   No current facility-administered medications for this visit.    Allergies:   Ace inhibitors, Beta adrenergic blockers, and Statins    Social History:  The patient  reports that he quit smoking about 39 years ago. His smoking use included cigarettes. He has a 30.00 pack-year smoking history. He has never used smokeless tobacco. He reports current alcohol use of about 2.0 standard drinks of alcohol per week. He reports that he does not use drugs.   Family History:  The patient's family history includes Healthy in his daughter, sister, and son; Heart attack in his father; Other in his mother.    ROS: All other systems are reviewed and negative. Unless otherwise mentioned in H&P    PHYSICAL EXAM: VS:  BP 111/67 (BP Location: Right Arm, Patient Position: Standing, Cuff Size: Normal)   Pulse (!) 58   Ht 5\' 8"  (1.727 m)   Wt 145 lb 12.8 oz (66.1 kg)    SpO2 99%   BMI 22.17 kg/m  , BMI Body mass index is 22.17 kg/m. GEN: Well nourished, well developed, in no acute distress HEENT: normal Neck: no JVD, carotid bruits, or masses Cardiac: RRR; bradycardic, soft systolic murmur heard on the left sternal border murmurs, rubs, or gallops,no edema  Respiratory:  Clear to auscultation bilaterally, normal work of breathing GI: soft, nontender, nondistended, + BS MS: no deformity or atrophy Skin: warm and dry, no rash Neuro:  Strength and sensation are intact Psych: euthymic mood, full affect   EKG: Sinus bradycardia heart rate of 54 bpm, left axis deviation with right bundle branch block.  (Personally reviewed)  Recent Labs: 03/01/2020: Magnesium 2.3 02/09/2021: BUN 29; Creatinine, Ser 1.22; Hemoglobin 12.8; Platelets 133; Potassium 4.9; Sodium 139    Lipid Panel    Component Value Date/Time   CHOL 128 06/05/2019 0820   TRIG 109 06/05/2019 0820   HDL 41 06/05/2019 0820   CHOLHDL 3.1 06/05/2019 0820   CHOLHDL  2.8 08/21/2018 0924   VLDL 11 08/21/2018 0924   LDLCALC 67 06/05/2019 0820      Wt Readings from Last 3 Encounters:  02/24/21 145 lb 12.8 oz (66.1 kg)  02/14/21 155 lb (70.3 kg)  01/27/21 168 lb 9.6 oz (76.5 kg)      Other studies Reviewed:  Echocardiogram 02/03/2021 1. Left ventricular ejection fraction, by estimation, is 60 to 65%. The  left ventricle has normal function. The left ventricle has no regional  wall motion abnormalities. There is mild left ventricular hypertrophy.  Left ventricular diastolic function  could not be evaluated.  2. Right ventricular systolic function is normal. The right ventricular  size is normal. There is normal pulmonary artery systolic pressure. The  estimated right ventricular systolic pressure is 19.1 mmHg.  3. Left atrial size was moderately dilated.  4. Right atrial size was moderately dilated.  5. The mitral valve is degenerative. Mild mitral valve regurgitation.  Moderate  mitral annular calcification.  6. The tricuspid valve is abnormal. Tricuspid valve regurgitation is  moderate.  7. The aortic valve is tricuspid. Aortic valve regurgitation is mild to  moderate. Mild aortic valve stenosis. Aortic valve area, by VTI measures  1.70 cm. Aortic valve mean gradient measures 15.2 mmHg. Aortic valve Vmax  measures 2.62 m/s.  8. Aortic dilatation noted. There is mild dilatation of the ascending  aorta, measuring 40 mm.  9. The inferior vena cava is dilated in size with >50% respiratory  variability, suggesting right atrial pressure of 8 mmHg.  Comparison(s): Changes from prior study are noted. 03/07/2020: LVEF 60-65%,  severe biatrial enlargement, moderate TR, mild AI.   ASSESSMENT AND PLAN:  1.  Paroxysmal atrial fibrillation: Status post cardioversion now in sinus bradycardia with right bundle branch block.  His symptoms have significantly improved concerning his shortness of breath and dyspnea on exertion.  He is not on AV nodal blocking agents, but remains on Xarelto 15 mg daily.  No bleeding issues.  Checking CBC.  2.  Orthostatic hypotension: On review of orthostatic vital signs, the patient did have a 30 point drop in blood pressure from lying to standing, BP of 147/77 with a pulse of 53 decreasing to 111/67 with a pulse of 58.  Therefore I will decrease his Lasix from 40 mg daily to 20 mg daily.  I will repeat a BMET and a TSH.  3.  Chronic combined systolic diastolic heart failure: There is no evidence of volume overload seen on exam today.  Decreasing Lasix to 20 mg daily.  We will see him again in a month to recheck his status.  Current medicines are reviewed at length with the patient today.  I have spent 40 minutes  dedicated to the care of this patient on the date of this encounter to include pre-visit review of records, assessment, management and diagnostic testing,with shared decision making.  Labs/ tests ordered today include: BMET, TSH.,   CBC Phill Myron. West Pugh, ANP, [facility]   02/24/2021 10:17 AM    [facility] [address] Office [phone] Fax [phone]  Notice: This dictation was prepared with Dragon dictation along with smaller phrase technology. Any transcriptional errors that result from this process are unintentional and may not be corrected upon review.

## 2021-02-24 ENCOUNTER — Ambulatory Visit (INDEPENDENT_AMBULATORY_CARE_PROVIDER_SITE_OTHER): Payer: Medicare Other | Admitting: Adult Health

## 2021-02-24 ENCOUNTER — Encounter: Payer: Self-pay | Admitting: Adult Health

## 2021-02-24 ENCOUNTER — Other Ambulatory Visit: Payer: Self-pay

## 2021-02-24 VITALS — BP 111/67 | HR 58 | Ht 68.0 in | Wt 145.8 lb

## 2021-02-24 DIAGNOSIS — I2510 Atherosclerotic heart disease of native coronary artery without angina pectoris: Secondary | ICD-10-CM

## 2021-02-24 DIAGNOSIS — I480 Paroxysmal atrial fibrillation: Secondary | ICD-10-CM

## 2021-02-24 DIAGNOSIS — I951 Orthostatic hypotension: Secondary | ICD-10-CM | POA: Diagnosis not present

## 2021-02-24 DIAGNOSIS — I5042 Chronic combined systolic (congestive) and diastolic (congestive) heart failure: Secondary | ICD-10-CM

## 2021-02-24 DIAGNOSIS — D696 Thrombocytopenia, unspecified: Secondary | ICD-10-CM

## 2021-02-24 LAB — BASIC METABOLIC PANEL
BUN/Creatinine Ratio: 24 (ref 10–24)
BUN: 31 mg/dL — ABNORMAL HIGH (ref 8–27)
CO2: 23 mmol/L (ref 20–29)
Calcium: 9.1 mg/dL (ref 8.6–10.2)
Chloride: 100 mmol/L (ref 96–106)
Creatinine, Ser: 1.3 mg/dL — ABNORMAL HIGH (ref 0.76–1.27)
Glucose: 87 mg/dL (ref 65–99)
Potassium: 4.7 mmol/L (ref 3.5–5.2)
Sodium: 138 mmol/L (ref 134–144)
eGFR: 54 mL/min/{1.73_m2} — ABNORMAL LOW (ref >59)

## 2021-02-24 LAB — TSH: TSH: 1.15 u[IU]/mL (ref 0.450–4.500)

## 2021-02-24 LAB — CBC
Hematocrit: 38 % (ref 37.5–51.0)
Hemoglobin: 12.6 g/dL — ABNORMAL LOW (ref 13.0–17.7)
MCH: 31.2 pg (ref 26.6–33.0)
MCHC: 33.2 g/dL (ref 31.5–35.7)
MCV: 94 fL (ref 79–97)
Platelets: 141 10*3/uL — ABNORMAL LOW (ref 150–450)
RBC: 4.04 x10E6/uL — ABNORMAL LOW (ref 4.14–5.80)
RDW: 13.3 % (ref 11.6–15.4)
WBC: 6.3 10*3/uL (ref 3.4–10.8)

## 2021-02-24 MED ORDER — FUROSEMIDE 20 MG PO TABS: 20.0000 mg | ORAL_TABLET | Freq: Every day | ORAL | 0 refills | Status: DC

## 2021-02-24 NOTE — Patient Instructions (Signed)
Medication Instructions:  Stop Lasix 40 mg. Start Lasix 20 mg (1 Tablet Daily) *If you need a refill on your cardiac medications before your next appointment, please call your pharmacy*   Lab Work: [facility] If you have labs (blood work) drawn today and your tests are completely normal, you will receive your results only by: Marland Kitchen MyChart Message (if you have MyChart) OR . A paper copy in the mail If you have any lab test that is abnormal or we need to change your treatment, we will call you to review the results.   Testing/Procedures: No Testing   Follow-Up: At [facility], you and your health needs are our priority.  As part of our continuing mission to provide you with exceptional heart care, we have created designated Provider Care Teams.  These Care Teams include your primary Cardiologist (physician) and Advanced Practice Providers (APPs -  Physician Assistants and Nurse Practitioners) who all work together to provide you with the care you need, when you need it.  Your next appointment:   1 month(s)  The format for your next appointment:   In Person  Provider:   Peter Martinique, MD

## 2021-03-07 ENCOUNTER — Telehealth: Payer: Self-pay | Admitting: *Deleted

## 2021-03-07 DIAGNOSIS — Z79899 Other long term (current) drug therapy: Secondary | ICD-10-CM

## 2021-03-07 NOTE — Telephone Encounter (Signed)
-----   Message from Lendon Colonel, NP sent at 03/02/2021 10:02 AM EDT ----- I have reviewed his labs. Kidney function was strained due to higher doses of lasix which I decreased from 40 ng daily to 20 mg daily. This should improve, He will need follow up BMET either by [place] or his PCP next week. You can order the BMET for him for next week.  The CBC is essentially unchanged but is favoring improvement. TSH is normal.   Marcus [name]

## 2021-03-07 NOTE — Telephone Encounter (Signed)
BMP ordered and lab slip mail to pt

## 2021-03-14 LAB — BASIC METABOLIC PANEL
BUN/Creatinine Ratio: 23 (ref 10–24)
BUN: 26 mg/dL (ref 8–27)
CO2: 22 mmol/L (ref 20–29)
Calcium: 8.6 mg/dL (ref 8.6–10.2)
Chloride: 106 mmol/L (ref 96–106)
Creatinine, Ser: 1.13 mg/dL (ref 0.76–1.27)
Glucose: 83 mg/dL (ref 65–99)
Potassium: 4.8 mmol/L (ref 3.5–5.2)
Sodium: 139 mmol/L (ref 134–144)
eGFR: 63 mL/min/{1.73_m2} (ref >59)

## 2021-03-15 ENCOUNTER — Other Ambulatory Visit: Payer: Self-pay | Admitting: Cardiology

## 2021-03-15 DIAGNOSIS — Z961 Presence of intraocular lens: Secondary | ICD-10-CM | POA: Diagnosis not present

## 2021-03-15 DIAGNOSIS — Z947 Corneal transplant status: Secondary | ICD-10-CM | POA: Diagnosis not present

## 2021-03-15 DIAGNOSIS — H02131 Senile ectropion of right upper eyelid: Secondary | ICD-10-CM | POA: Diagnosis not present

## 2021-03-15 DIAGNOSIS — H02134 Senile ectropion of left upper eyelid: Secondary | ICD-10-CM | POA: Diagnosis not present

## 2021-03-17 ENCOUNTER — Encounter: Payer: Self-pay | Admitting: Podiatry

## 2021-03-17 ENCOUNTER — Other Ambulatory Visit: Payer: Self-pay

## 2021-03-17 ENCOUNTER — Ambulatory Visit (INDEPENDENT_AMBULATORY_CARE_PROVIDER_SITE_OTHER): Payer: Medicare Other | Admitting: Podiatry

## 2021-03-17 DIAGNOSIS — M79674 Pain in right toe(s): Secondary | ICD-10-CM | POA: Diagnosis not present

## 2021-03-17 DIAGNOSIS — L84 Corns and callosities: Secondary | ICD-10-CM | POA: Diagnosis not present

## 2021-03-17 DIAGNOSIS — M79675 Pain in left toe(s): Secondary | ICD-10-CM

## 2021-03-17 DIAGNOSIS — E538 Deficiency of other specified B group vitamins: Secondary | ICD-10-CM

## 2021-03-17 DIAGNOSIS — G629 Polyneuropathy, unspecified: Secondary | ICD-10-CM

## 2021-03-17 DIAGNOSIS — B351 Tinea unguium: Secondary | ICD-10-CM | POA: Diagnosis not present

## 2021-03-17 NOTE — Progress Notes (Signed)
  Subjective:  Patient ID: Marcus Kaufmann Sr., male    DOB: 1932-02-24,  MRN: [id number]  Marcus Kaufmann Sr. presents to clinic today with h/o B-12 deficiency and neuropathy symptoms. He has painful thick toenails that are difficult to trim. Pain interferes with ambulation. Aggravating factors include wearing enclosed shoe gear. Pain is relieved with periodic professional debridement.  He is followed by Neurology for Myasthenia Gravis and Vitamin B-12 deficiency.  His PCP is Dr. [name] and last visit was 01/25/2021.  Allergies  Allergen Reactions   Ace Inhibitors Hives   Beta Adrenergic Blockers Hives   Statins Hives    Review of Systems: Negative except as noted in the HPI. Objective:   Constitutional Marcus A Matura Sr. is a pleasant 87 y.o. Caucasian male, in NAD. AAO x 3.   Vascular Capillary refill time to digits immediate b/l. Palpable pedal pulses b/l LE. Pedal hair sparse. Lower extremity skin temperature gradient within normal limits. No pain with calf compression b/l. No edema noted b/l lower extremities. No cyanosis or clubbing noted.  Neurologic Normal speech. Oriented to person, place, and time. Pt has subjective symptoms of neuropathy. Protective sensation intact 5/5 intact bilaterally with 10g monofilament b/l. Vibratory sensation intact b/l.  Dermatologic Pedal skin with normal turgor, texture and tone b/l lower extremities No open wounds b/l lower extremities No interdigital macerations b/l lower extremities Toenails 1-5 b/l elongated, discolored, dystrophic, thickened, crumbly with subungual debris and tenderness to dorsal palpation. Hyperkeratotic lesion(s) submet head 5 left foot and submet head 5 right foot.  No erythema, no edema, no drainage, no fluctuance.  Orthopedic: Normal muscle strength 5/5 to all lower extremity muscle groups bilaterally. No pain crepitus or joint limitation noted with ROM b/l. Hallux valgus with bunion deformity noted b/l lower  extremities.   Radiographs: None Assessment:   1. Pain due to onychomycosis of toenails of both feet   2. Callus   3. Neuropathy   4. B12 deficiency    Plan:  -Patient was evaluated and treated and all questions answered. -Examined patient. -Patient to continue soft, supportive shoe gear daily. -Toenails 1-5 b/l were debrided in length and girth with sterile nail nippers and dremel without iatrogenic bleeding.  -Callus(es) submet head 5 left foot and submet head 5 right foot pared utilizing sterile scalpel blade without complication or incident. Total number debrided =2. -Patient to report any pedal injuries to medical professional immediately. -Patient/POA to call should there be question/concern in the interim.  Return in about 3 months (around 06/17/2021).  Marzetta Board, DPM

## 2021-03-23 ENCOUNTER — Encounter: Payer: Self-pay | Admitting: Podiatry

## 2021-03-24 ENCOUNTER — Ambulatory Visit (INDEPENDENT_AMBULATORY_CARE_PROVIDER_SITE_OTHER): Payer: Medicare Other

## 2021-03-24 ENCOUNTER — Other Ambulatory Visit: Payer: Self-pay

## 2021-03-24 DIAGNOSIS — E538 Deficiency of other specified B group vitamins: Secondary | ICD-10-CM | POA: Diagnosis not present

## 2021-03-24 MED ORDER — CYANOCOBALAMIN 1000 MCG/ML IJ SOLN
1000.0000 ug | Freq: Once | INTRAMUSCULAR | Status: AC
  Administered 2021-03-24: 1000 ug via INTRAMUSCULAR

## 2021-03-24 NOTE — Progress Notes (Signed)
Patient received B12 shot. Waited 10 minutes and had no reactions or concerns.

## 2021-04-24 ENCOUNTER — Other Ambulatory Visit: Payer: Self-pay

## 2021-04-24 ENCOUNTER — Ambulatory Visit (INDEPENDENT_AMBULATORY_CARE_PROVIDER_SITE_OTHER): Payer: Medicare Other

## 2021-04-24 DIAGNOSIS — E538 Deficiency of other specified B group vitamins: Secondary | ICD-10-CM | POA: Diagnosis not present

## 2021-04-24 MED ORDER — CYANOCOBALAMIN 1000 MCG/ML IJ SOLN
1000.0000 ug | Freq: Once | INTRAMUSCULAR | Status: AC
  Administered 2021-04-24: 1000 ug via INTRAMUSCULAR

## 2021-05-15 DIAGNOSIS — B078 Other viral warts: Secondary | ICD-10-CM | POA: Diagnosis not present

## 2021-05-15 DIAGNOSIS — L814 Other melanin hyperpigmentation: Secondary | ICD-10-CM | POA: Diagnosis not present

## 2021-05-15 DIAGNOSIS — Z85828 Personal history of other malignant neoplasm of skin: Secondary | ICD-10-CM | POA: Diagnosis not present

## 2021-05-15 DIAGNOSIS — D692 Other nonthrombocytopenic purpura: Secondary | ICD-10-CM | POA: Diagnosis not present

## 2021-05-15 DIAGNOSIS — D1801 Hemangioma of skin and subcutaneous tissue: Secondary | ICD-10-CM | POA: Diagnosis not present

## 2021-05-15 DIAGNOSIS — D2272 Melanocytic nevi of left lower limb, including hip: Secondary | ICD-10-CM | POA: Diagnosis not present

## 2021-05-15 DIAGNOSIS — L570 Actinic keratosis: Secondary | ICD-10-CM | POA: Diagnosis not present

## 2021-05-15 DIAGNOSIS — L110 Acquired keratosis follicularis: Secondary | ICD-10-CM | POA: Diagnosis not present

## 2021-05-15 DIAGNOSIS — L821 Other seborrheic keratosis: Secondary | ICD-10-CM | POA: Diagnosis not present

## 2021-05-15 DIAGNOSIS — D485 Neoplasm of uncertain behavior of skin: Secondary | ICD-10-CM | POA: Diagnosis not present

## 2021-05-24 ENCOUNTER — Other Ambulatory Visit: Payer: Self-pay | Admitting: Adult Health

## 2021-05-24 ENCOUNTER — Other Ambulatory Visit: Payer: Self-pay

## 2021-05-24 ENCOUNTER — Ambulatory Visit: Payer: Medicare Other

## 2021-05-24 MED ORDER — CYANOCOBALAMIN 1000 MCG/ML IJ SOLN
1000.0000 ug | Freq: Once | INTRAMUSCULAR | Status: AC
  Administered 2021-05-24: 1000 ug via INTRAMUSCULAR

## 2021-05-25 ENCOUNTER — Telehealth: Payer: Self-pay

## 2021-05-25 NOTE — Telephone Encounter (Signed)
Spoke to patient about letter he mailed Dr.[name].Dr.[name] advised no cardiac testing is needed before his trip to [place].He advised he would like to check you before your trip.Appointment was offered in 11/11.Patient hung up phone.Called patient back left message on personal voice mail if he wants to schedule appointment with Dr.[name] to call back.

## 2021-06-06 ENCOUNTER — Telehealth: Payer: Self-pay | Admitting: Cardiology

## 2021-06-06 NOTE — Telephone Encounter (Signed)
Returned call to patient of Dr. Martinique who reports dizziness, instability while standing for 1 year. He reports symptoms have worsened recently. He reports BP and HR are good and no drops in readings that he has noticed. Advised this is difficult to manage over the phone so he has been scheduled for appointment with [name] NP on 06/09/21

## 2021-06-06 NOTE — Telephone Encounter (Signed)
Pt sent this Via MyChart to the schding pool:      It is not like an inner ear thing .It is a feeling that I may just lose consciousness if I don't quickly get to a chair.I have never passed out but have felt I was going to more often.It happens when I stand in one place as when I am preparing food etc in the kitchen.My HR and BP and oxygen rate are all normal.I have no afib .I have been exploring my symptoms online and it seems that they are most like pots and I have no idea what pots stands for.It has been going on for quite some time but has me basically unable to carry on normal living.RB     Good Morning [name] Can you tell me a little more about your Dizziness?    STAT if patient feels like he/she is going to faint   Are you dizzy now?   Do you feel faint or have you passed out?   Do you have any other symptoms?   Have you checked your HR and BP (record if available)?      ----- Message -----      From:Marcus A Armon Sr.      Sent:06/06/2021  7:09 AM EDT        [id number] Martinique, MD   Subject:Appointment Request  Reason for visit: Office Visit  Comments: I have been experiencing an increasing  lack of stability while standing in place more than a few minutes.It has been much worse the past week and prevents me for normal activities.

## 2021-06-07 NOTE — Progress Notes (Signed)
Cardiology Office Note   Date:  06/09/2021   ID:  Marcus Kaufmann Sr., DOB Feb 26, 1932, MRN [id number]  PCP:  [name], MD  Cardiologist:  Dr. Martinique  No chief complaint on file.    History of Present Illness: Marcus PHINNEY Sr. is a 87 y.o. male who presents for ongoing assessment and management of symptomatic atrial fibrillation status post cardioversion, CAD with cardiac catheterization in 08/21/2018 with intervention of the RCA with a drug-eluting stent.  He did have residual total LAD that Dr. Alvester Chou attempted to cross but it appeared that the wire got subintimal and therefore the procedure was aborted.  Patient had a successful cardioversion on 02/14/2021 and is continued on Xarelto.  Other history includes acute on chronic systolic and diastolic CHF, history of hiatal hernia, hyperlipidemia, hypertension, hypothyroidism.  On last office visit 02/24/2021 he was feeling better concerning his symptoms of shortness of breath and dyspnea on exertion however he was complaining of dizziness and near syncope with blurred vision when he stood up but he has not had any actual syncopal episodes.  At that office visit I decrease his Lasix from 40 mg daily to 20 mg daily as he was found to be orthostatic.  He was not found to be volume overloaded.  He was bradycardic but was not on any AV nodal blocking agents.  He contacted our office on 06/07/2021 feeling foggy headed and short of breath.  He asked for a series of labs to be completed prior to this office visit.  I ordered a TSH, vitamin D, BMET, CBC, and fasting lipids and LFTs.  He will have an EKG at this office visit.  He comes today with complaints of positional dizziness.  He did see his PCP yesterday who discontinued diuretics.  He has not yet found any changes in how he feels off of the diuretic.  The patient is concerned because his daughter is getting married next month and he wants to be able to give her away.  However each time he stands  for any period of time he gets very lightheaded and dizzy and foggy.  He sometimes has blurred vision.  EKG today reveals sinus bradycardia with first-degree block PR interval of 220 ms, with a right bundle branch block which is not new.  The AV block is new.  I have reviewed the labs that he brought with him from his PCP which were drawn on 06/08/2021 he was not found to be anemic kidney function was normal with creatinine of 1.1 GFR was 63.5 sodium and potassium were normal.  I will not repeat labs today.    Past Medical History:  Diagnosis Date   Acute on chronic combined systolic and diastolic CHF (congestive heart failure) ([place]) 08/22/2018   Acute on chronic respiratory failure ([place]) 04/15/2015   CAD in native artery 08/22/2018   CAP (community acquired pneumonia) 04/13/2015   See cxr 04/13/2015 > admit [facility]     Complication of anesthesia    H/O hiatal hernia    Hyperlipidemia LDL goal <70 08/22/2018   Hypertension 08/22/2018   Hypothyroidism 04/13/2015   Myasthenia gravis ([place]) 04/13/2015   Obesity 04/13/2015   Paroxysmal A-fib ([place]) 04/16/2015   S/P angioplasty with stent 08/21/18 DES to RCA 08/22/2018   Sepsis ([place]) 04/15/2015    Past Surgical History:  Procedure Laterality Date   AMPUTATION  06/11/2012   Procedure: AMPUTATION DIGIT;  Surgeon: [name], MD;  Location: [facility];  Service: Orthopedics;  Laterality:  Left;  revision of amputation   CARDIOVERSION N/A 03/24/2020   Procedure: CARDIOVERSION;  Surgeon: [name], [name], MD;  Location: [facility] ENDOSCOPY;  Service: Cardiovascular;  Laterality: N/A;   CARDIOVERSION N/A 02/14/2021   Procedure: CARDIOVERSION;  Surgeon: [name], MD;  Location: [facility] ENDOSCOPY;  Service: Cardiovascular;  Laterality: N/A;   CORONARY STENT INTERVENTION N/A 08/21/2018   Procedure: CORONARY STENT INTERVENTION;  Surgeon: [name], MD;  Location: [facility] CV LAB;  Service: Cardiovascular;  Laterality: N/A;   HERNIA REPAIR  1994   LEFT  HEART CATH AND CORONARY ANGIOGRAPHY N/A 08/21/2018   Procedure: LEFT HEART CATH AND CORONARY ANGIOGRAPHY;  Surgeon: [name], MD;  Location: [facility] CV LAB;  Service: Cardiovascular;  Laterality: N/A;   ORIF SHOULDER FRACTURE  06/13/2012   Procedure: OPEN REDUCTION INTERNAL FIXATION (ORIF) SHOULDER FRACTURE;  Surgeon: [name], MD;  Location: [facility];  Service: Orthopedics;  Laterality: Left;  LEFT SHOULDER OPEN GREATER TUBEROSITY ORIF   SHOULDER CLOSED REDUCTION  06/11/2012   Procedure: CLOSED MANIPULATION SHOULDER;  Surgeon: [name], MD;  Location: [facility];  Service: Orthopedics;  Laterality: Left;     Current Outpatient Medications  Medication Sig Dispense Refill   Ascorbic Acid (VITAMIN C) 1000 MG tablet Take 1,000 mg by mouth daily.     cholecalciferol (VITAMIN D3) 25 MCG (1000 UNIT) tablet Take 1,000 Units by mouth daily.     levothyroxine (SYNTHROID) 137 MCG tablet Take 137 mcg by mouth daily before breakfast.     XARELTO 15 MG TABS tablet TAKE 1 TABLET(15 MG) BY MOUTH DAILY WITH SUPPER 90 tablet 1   amLODipine (NORVASC) 5 MG tablet Take 1 tablet (5 mg total) by mouth daily. 90 tablet 0   nitroGLYCERIN (NITROSTAT) 0.4 MG SL tablet Place 1 tablet (0.4 mg total) under the tongue every 5 (five) minutes as needed for chest pain. (Patient taking differently: Place 0.4 mg under the tongue every 5 (five) minutes x 3 doses as needed for chest pain.) 25 tablet 6   No current facility-administered medications for this visit.    Allergies:   Ace inhibitors, Beta adrenergic blockers, and Statins    Social History:  The patient  reports that he quit smoking about 39 years ago. His smoking use included cigarettes. He has a 30.00 pack-year smoking history. He has never used smokeless tobacco. He reports current alcohol use of about 2.0 standard drinks per week. He reports that he does not use drugs.   Family History:  The patient's family history includes Healthy in his  daughter, sister, and son; Heart attack in his father; Other in his mother.    ROS: All other systems are reviewed and negative. Unless otherwise mentioned in H&P    PHYSICAL EXAM: VS:  Resp 20   Ht '5\' 8"'$  (1.727 m)   Wt 153 lb 3.2 oz (69.5 kg)   SpO2 100%   BMI 23.29 kg/m  , BMI Body mass index is 23.29 kg/m. GEN: Well nourished, well developed, in no acute distress HEENT: normal Neck: no JVD, carotid bruits, or masses Cardiac:RRR; bradycardic, occasional extrasystole, soft systolic murmur heard best at the apex, murmurs, rubs, or gallops,no edema  Respiratory:  Clear to auscultation bilaterally, normal work of breathing GI: soft, nontender, nondistended, + BS MS: no deformity or atrophy Skin: warm and dry, no rash Neuro:  Strength and sensation are intact Psych: euthymic mood, full affect   EKG:  EKG is ordered today. The ekg  ordered today demonstrates sinus bradycardia with right bundle branch block left axis deviation with first-degree AV block PR interval 220 ms , heart rate of 56 bpm.  (Personally reviewed)   Recent Labs: 02/24/2021: Hemoglobin 12.6; Platelets 141; TSH 1.150 03/14/2021: BUN 26; Creatinine, Ser 1.13; Potassium 4.8; Sodium 139    Lipid Panel    Component Value Date/Time   CHOL 128 06/05/2019 0820   TRIG 109 06/05/2019 0820   HDL 41 06/05/2019 0820   CHOLHDL 3.1 06/05/2019 0820   CHOLHDL 2.8 08/21/2018 0924   VLDL 11 08/21/2018 0924   LDLCALC 67 06/05/2019 0820      Wt Readings from Last 3 Encounters:  06/09/21 153 lb 3.2 oz (69.5 kg)  02/24/21 145 lb 12.8 oz (66.1 kg)  02/14/21 155 lb (70.3 kg)      Other studies Reviewed: Echocardiogram 03/01/2021  1. Left ventricular ejection fraction, by estimation, is 60 to 65%. The  left ventricle has normal function. The left ventricle has no regional  wall motion abnormalities. There is mild left ventricular hypertrophy.  Left ventricular diastolic function  could not be evaluated.   2. Right  ventricular systolic function is normal. The right ventricular  size is normal. There is normal pulmonary artery systolic pressure. The  estimated right ventricular systolic pressure is [id number] mmHg.   3. Left atrial size was moderately dilated.   4. Right atrial size was moderately dilated.   5. The mitral valve is degenerative. Mild mitral valve regurgitation.  Moderate mitral annular calcification.   6. The tricuspid valve is abnormal. Tricuspid valve regurgitation is  moderate.   7. The aortic valve is tricuspid. Aortic valve regurgitation is mild to  moderate. Mild aortic valve stenosis. Aortic valve area, by VTI measures  1.70 cm. Aortic valve mean gradient measures 15.2 mmHg. Aortic valve Vmax  measures 2.62 m/s.   8. Aortic dilatation noted. There is mild dilatation of the ascending  aorta, measuring 40 mm.   9. The inferior vena cava is dilated in size with >50% respiratory  variability, suggesting right atrial pressure of 8 mmHg.  Comparison(s): Changes from prior study are noted. 03/07/2020: LVEF 60-65%,  severe biatrial enlargement, moderate TR, mild AI.    Assessment and Plan:   1.  Orthostatic hypotension: Orthostatics were completed today revealing significant drop in his blood pressure from 147/80-100 and 117/66.  He was recently seen by his primary care provider who discontinued diuretic.  I will decrease his amlodipine to 5 mg daily from 10 mg daily.  We may have to consider another antihypertensive agent or to continue to decrease amlodipine dose.  He is allergic to ACE inhibitor's causing hives.  Beta-blocker would not be appropriate due to bradycardia.  2.  Bradycardia: In light of dizziness and lightheadedness with standing, I am going to check his heart rate with a cardiac monitor to evaluate if bradycardia is playing into his dizziness and near syncope.  He is not on AV nodal blocking agents.  This will be worn for 1 week.  3.  Coronary artery disease: History of  intervention to RCA.  He denies any chest pain or dyspnea on exertion.  He is medically compliant.  4.  Atrial fibrillation: He is not on any rate control medications.  Remains on Xarelto 15 mg daily for anticoagulation.  He is currently in sinus rhythm with right bundle branch block, bradycardic in the 50s..  He has had recent labs which she has brought with him today which did not  show evidence of anemia.   Current medicines are reviewed at length with the patient today.  I have spent 45 min's  dedicated to the care of this patient on the date of this encounter to include pre-visit review of records, assessment, management and diagnostic testing,with shared decision making.  Labs/ tests ordered today include: Cardiac monitor for 3 to 7 days.  Phill Myron. West Pugh, ANP, AACC   06/09/2021 12:26 PM    [facility] [place] [address] Office [phone] Fax [phone]  Notice: This dictation was prepared with Dragon dictation along with smaller phrase technology. Any transcriptional errors that result from this process are unintentional and may not be corrected upon review.

## 2021-06-08 DIAGNOSIS — I10 Essential (primary) hypertension: Secondary | ICD-10-CM | POA: Diagnosis not present

## 2021-06-08 DIAGNOSIS — G7000 Myasthenia gravis without (acute) exacerbation: Secondary | ICD-10-CM | POA: Diagnosis not present

## 2021-06-08 DIAGNOSIS — I2510 Atherosclerotic heart disease of native coronary artery without angina pectoris: Secondary | ICD-10-CM | POA: Diagnosis not present

## 2021-06-08 DIAGNOSIS — I951 Orthostatic hypotension: Secondary | ICD-10-CM | POA: Diagnosis not present

## 2021-06-08 DIAGNOSIS — R55 Syncope and collapse: Secondary | ICD-10-CM | POA: Diagnosis not present

## 2021-06-08 DIAGNOSIS — E039 Hypothyroidism, unspecified: Secondary | ICD-10-CM | POA: Diagnosis not present

## 2021-06-08 DIAGNOSIS — I129 Hypertensive chronic kidney disease with stage 1 through stage 4 chronic kidney disease, or unspecified chronic kidney disease: Secondary | ICD-10-CM | POA: Diagnosis not present

## 2021-06-08 DIAGNOSIS — N1831 Chronic kidney disease, stage 3a: Secondary | ICD-10-CM | POA: Diagnosis not present

## 2021-06-09 ENCOUNTER — Other Ambulatory Visit: Payer: Self-pay | Admitting: Adult Health

## 2021-06-09 ENCOUNTER — Ambulatory Visit (INDEPENDENT_AMBULATORY_CARE_PROVIDER_SITE_OTHER): Payer: Medicare Other

## 2021-06-09 ENCOUNTER — Ambulatory Visit (INDEPENDENT_AMBULATORY_CARE_PROVIDER_SITE_OTHER): Payer: Medicare Other | Admitting: Adult Health

## 2021-06-09 ENCOUNTER — Other Ambulatory Visit: Payer: Self-pay

## 2021-06-09 ENCOUNTER — Encounter: Payer: Self-pay | Admitting: Adult Health

## 2021-06-09 VITALS — Resp 20 | Ht 68.0 in | Wt 153.2 lb

## 2021-06-09 DIAGNOSIS — E7800 Pure hypercholesterolemia, unspecified: Secondary | ICD-10-CM

## 2021-06-09 DIAGNOSIS — E039 Hypothyroidism, unspecified: Secondary | ICD-10-CM

## 2021-06-09 DIAGNOSIS — I5043 Acute on chronic combined systolic (congestive) and diastolic (congestive) heart failure: Secondary | ICD-10-CM

## 2021-06-09 DIAGNOSIS — R001 Bradycardia, unspecified: Secondary | ICD-10-CM | POA: Diagnosis not present

## 2021-06-09 DIAGNOSIS — I2510 Atherosclerotic heart disease of native coronary artery without angina pectoris: Secondary | ICD-10-CM

## 2021-06-09 DIAGNOSIS — R55 Syncope and collapse: Secondary | ICD-10-CM

## 2021-06-09 DIAGNOSIS — I951 Orthostatic hypotension: Secondary | ICD-10-CM

## 2021-06-09 DIAGNOSIS — R5382 Chronic fatigue, unspecified: Secondary | ICD-10-CM

## 2021-06-09 MED ORDER — AMLODIPINE BESYLATE 5 MG PO TABS: 5.0000 mg | ORAL_TABLET | Freq: Every day | ORAL | 0 refills | Status: DC

## 2021-06-09 NOTE — Patient Instructions (Addendum)
Medication Instructions:   START TAKING AMLODIPINE 5 MG ONCE A DAY   *If you need a refill on your cardiac medications before your next appointment, please call your pharmacy*   Lab Work: NONE ORDERED  TODAY   If you have labs (blood work) drawn today and your tests are completely normal, you will receive your results only by: [place] (if you have MyChart) OR A paper copy in the mail If you have any lab test that is abnormal or we need to change your treatment, we will call you to review the results.   Testing/Procedures: FOR 3-5 DAYS Your physician has recommended that you wear an event monitor. Event monitors are medical devices that record the heart's electrical activity. Doctors most often [place] these monitors to diagnose arrhythmias. Arrhythmias are problems with the speed or rhythm of the heartbeat. The monitor is a small, portable device. You can wear one while you do your normal daily activities. This is usually used to diagnose what is causing palpitations/syncope (passing out).   ZIO XT- Long Term Monitor Instructions  Your physician has requested you wear a ZIO patch monitor for  3-5  days.  This is a single patch monitor. [employer] supplies one patch monitor per enrollment. Additional stickers are not available. Please do not apply patch if you will be having a Nuclear Stress Test,  Echocardiogram, Cardiac CT, MRI, or Chest Xray during the period you would be wearing the  monitor. The patch cannot be worn during these tests. You cannot remove and re-apply the  ZIO XT patch monitor.  Your ZIO patch monitor will be mailed 3 day USPS to your address on file. It may take 3-5 days  to receive your monitor after you have been enrolled.  Once you have received your monitor, please review the enclosed instructions. Your monitor  has already been registered assigning a specific monitor serial # to you.  Billing and Patient Assistance Program Information  We have supplied  [employer] with any of your insurance information on file for billing purposes. [employer] offers a sliding scale Patient Assistance Program for patients that do not have  insurance, or whose insurance does not completely cover the cost of the ZIO monitor.  You must apply for the Patient Assistance Program to qualify for this discounted rate.  To apply, please call [employer] at [phone], select option 4, select option 2, ask to apply for  Patient Assistance Program. Theodore Demark will ask your household income, and how many people  are in your household. They will quote your out-of-pocket cost based on that information.  [employer] will also be able to set up a 4-month interest-free payment plan if needed.  Applying the monitor   Shave hair from upper left chest.  Hold abrader disc by orange tab. Rub abrader in 40 strokes over the upper left chest as  indicated in your monitor instructions.  Clean area with 4 enclosed alcohol pads. Let dry.  Apply patch as indicated in monitor instructions. Patch will be placed under collarbone on left  side of chest with arrow pointing upward.  Rub patch adhesive wings for 2 minutes. Remove white label marked "1". Remove the white  label marked "2". Rub patch adhesive wings for 2 additional minutes.  While looking in a mirror, press and release button in center of patch. A small green light will  flash 3-4 times. This will be your only indicator that the monitor has been turned on.  Do not shower for the first  24 hours. You may shower after the first 24 hours.  Press the button if you feel a symptom. You will hear a small click. Record Date, Time and  Symptom in the Patient Logbook.  When you are ready to remove the patch, follow instructions on the last 2 pages of Patient  Logbook. Stick patch monitor onto the last page of Patient Logbook.  Place Patient Logbook in the blue and white box. Use locking tab on box and tape box closed  securely. The blue and white box  has prepaid postage on it. Please place it in the mailbox as  soon as possible. Your physician should have your test results approximately 7 days after the  monitor has been mailed back to [facility].  Call [place] [place] at [phone] if you have questions regarding  your ZIO XT patch monitor. Call them immediately if you see an orange light blinking on your  monitor.  If your monitor falls off in less than 4 days, contact our Monitor department at [phone].  If your monitor becomes loose or falls off after 4 days call [employer] at [phone] for  suggestions on securing your monitor   Follow-Up: At [facility], you and your health needs are our priority.  As part of our continuing mission to provide you with exceptional heart care, we have created designated Provider Care Teams.  These Care Teams include your primary Cardiologist (physician) and Advanced Practice Providers (APPs -  Physician Assistants and Nurse Practitioners) who all work together to provide you with the care you need, when you need it.  We recommend signing up for the patient portal called "MyChart".  Sign up information is provided on this After Visit Summary.  MyChart is used to connect with patients for Virtual Visits (Telemedicine).  Patients are able to view lab/test results, encounter notes, upcoming appointments, etc.  Non-urgent messages can be sent to your provider as well.   To learn more about what you can do with MyChart, go to [email or web address].    Your next appointment:   2 week(s) (PER KATHERINE LAWRENCE OR  ANY  AVAILABLE APP)   The format for your next appointment:   In Person  Provider:   You will see one of the following Advanced Practice Providers on your designated Care Team:   Rosaria Ferries, PA-C Caron Presume, PA-C Jory Sims, DNP, ANP    Other Instructions

## 2021-06-09 NOTE — Progress Notes (Unsigned)
Enrolled patient for a 7 day Zio XT monitor to be mailed to patients home   Dr. Martinique to read

## 2021-06-14 DIAGNOSIS — R001 Bradycardia, unspecified: Secondary | ICD-10-CM | POA: Diagnosis not present

## 2021-06-22 DIAGNOSIS — R5383 Other fatigue: Secondary | ICD-10-CM | POA: Diagnosis not present

## 2021-06-23 ENCOUNTER — Ambulatory Visit (INDEPENDENT_AMBULATORY_CARE_PROVIDER_SITE_OTHER): Payer: Medicare Other

## 2021-06-23 ENCOUNTER — Other Ambulatory Visit: Payer: Self-pay

## 2021-06-23 ENCOUNTER — Ambulatory Visit (INDEPENDENT_AMBULATORY_CARE_PROVIDER_SITE_OTHER): Payer: Medicare Other | Admitting: Podiatry

## 2021-06-23 DIAGNOSIS — L84 Corns and callosities: Secondary | ICD-10-CM

## 2021-06-23 DIAGNOSIS — N1831 Chronic kidney disease, stage 3a: Secondary | ICD-10-CM | POA: Insufficient documentation

## 2021-06-23 DIAGNOSIS — I951 Orthostatic hypotension: Secondary | ICD-10-CM | POA: Insufficient documentation

## 2021-06-23 DIAGNOSIS — B351 Tinea unguium: Secondary | ICD-10-CM

## 2021-06-23 DIAGNOSIS — G629 Polyneuropathy, unspecified: Secondary | ICD-10-CM

## 2021-06-23 DIAGNOSIS — M79675 Pain in left toe(s): Secondary | ICD-10-CM | POA: Diagnosis not present

## 2021-06-23 DIAGNOSIS — E538 Deficiency of other specified B group vitamins: Secondary | ICD-10-CM

## 2021-06-23 DIAGNOSIS — R945 Abnormal results of liver function studies: Secondary | ICD-10-CM | POA: Insufficient documentation

## 2021-06-23 DIAGNOSIS — M79674 Pain in right toe(s): Secondary | ICD-10-CM

## 2021-06-23 DIAGNOSIS — R7989 Other specified abnormal findings of blood chemistry: Secondary | ICD-10-CM | POA: Insufficient documentation

## 2021-06-23 MED ORDER — CYANOCOBALAMIN 1000 MCG/ML IJ SOLN
1000.0000 ug | Freq: Once | INTRAMUSCULAR | Status: AC
Start: 2021-06-23 — End: 2021-06-23
  Administered 2021-06-23: 1000 ug via INTRAMUSCULAR

## 2021-06-26 DIAGNOSIS — H903 Sensorineural hearing loss, bilateral: Secondary | ICD-10-CM | POA: Diagnosis not present

## 2021-06-26 DIAGNOSIS — H6123 Impacted cerumen, bilateral: Secondary | ICD-10-CM | POA: Diagnosis not present

## 2021-06-26 DIAGNOSIS — H838X3 Other specified diseases of inner ear, bilateral: Secondary | ICD-10-CM | POA: Diagnosis not present

## 2021-06-27 ENCOUNTER — Encounter: Payer: Self-pay | Admitting: Podiatry

## 2021-06-27 NOTE — Progress Notes (Signed)
Subjective: Marcus Kaufmann Sr. is a 87 y.o. male patient seen today for follow up of  painful thick toenails that are difficult to trim. Pain interferes with ambulation. Aggravating factors include wearing enclosed shoe gear. Pain is relieved with periodic professional debridement.  He is seen for at risk foot care due to Myasthenia Gravis and Vitamin B-12 deficiency.  New problems reported today: None.  PCP is [name], MD. Last visit was: 06/22/2021.  Allergies  Allergen Reactions   Ace Inhibitors Hives   Beta Adrenergic Blockers Hives   Statins Hives    PCP is [name], MD .  Objective: Physical Exam  General: Patient is a pleasant 87 y.o. Caucasian male in NAD. AAO x 3.   Neurovascular Examination: Capillary refill time to digits immediate b/l. Palpable DP pulse(s) b/l lower extremities Palpable PT pulse(s) b/l lower extremities Pedal hair sparse. Lower extremity skin temperature gradient within normal limits. No pain with calf compression b/l. No edema noted b/l lower extremities.  Pt has subjective symptoms of neuropathy. Protective sensation intact 5/5 intact bilaterally with 10g monofilament b/l. Vibratory sensation intact b/l.  Dermatological:  Skin warm and supple b/l lower extremities. Toenails 1-5 b/l elongated, discolored, dystrophic, thickened, crumbly with subungual debris and tenderness to dorsal palpation. Hyperkeratotic lesion(s) submet head 5 left foot and submet head 5 right foot.  No erythema, no edema, no drainage, no fluctuance.  Musculoskeletal:  Normal muscle strength 5/5 to all lower extremity muscle groups bilaterally. Hallux valgus with bunion deformity noted b/l lower extremities.  Assessment: 1. Pain due to onychomycosis of toenails of both feet   2. Callus   3. Neuropathy   4. B12 deficiency    Plan: Patient was evaluated and treated and all questions answered. Consent given for treatment as described below: -No new findings. No  new orders. -Toenails 1-5 b/l were debrided in length and girth with sterile nail nippers and dremel without iatrogenic bleeding.  -Callus(es) submet head 5 left foot and submet head 5 right foot pared utilizing sterile scalpel blade without complication or incident. Total number debrided =2. -Patient to report any pedal injuries to medical professional immediately. -Patient/POA to call should there be question/concern in the interim.  Return in about 3 months (around 09/22/2021).  Marzetta Board, DPM

## 2021-06-28 DIAGNOSIS — R001 Bradycardia, unspecified: Secondary | ICD-10-CM | POA: Diagnosis not present

## 2021-06-29 ENCOUNTER — Telehealth: Payer: Self-pay

## 2021-06-29 NOTE — Telephone Encounter (Signed)
-----   Message from Lendon Colonel, NP sent at 06/29/2021 10:45 AM EDT ----- I have reviewed his cardiac monitor results.  His heart rate did decrease to 29 bpm during sleep but had an average HR of 59 bpm. He was most often in normal rhythm.   Can consider pacemaker if he is symptomatic during the daytime with syncope or near syncope.  Otherwise no changes in his regimen. Please make sure the next appointment is with Dr. Martinique to discuss.

## 2021-06-29 NOTE — Progress Notes (Signed)
Cardiology Office Note   Date:  06/30/2021   ID:  Marcus Kaufmann Sr., DOB 1931-11-10, MRN [id number]  PCP:  [name], MD  Cardiologist: Dr. Martinique CC: Follow Up    History of Present Illness: Marcus Kaufmann Sr. is a 87 y.o. male who presents for ongoing assessment and management of symptomatic atrial fibrillation status post cardioversion, CAD with cardiac catheterization in 08/21/2018 with intervention of the RCA with a drug-eluting stent.  He did have residual total LAD that Dr. [name] attempted to cross but it appeared that the wire got subintimal and therefore the procedure was aborted.  Patient had a successful cardioversion on 02/14/2021 and is continued on Xarelto.  Other history includes acute on chronic systolic and diastolic CHF, history of hiatal hernia, hyperlipidemia, hypertension, hypothyroidism.  On last office visit dated 06/09/2021 he had complaints of positional dizziness.  He did see his PCP who had discontinued diuretics prior to being seen but he did not notice any improvement with changes in medication.  He states each time he stands for any period of time he gets lightheaded and dizzy and foggy with some blurred vision.  EKG revealed sinus bradycardia with first-degree AV block with a PR interval of 220 ms with a right bundle branch block which was not new.  I did review his labs at the time all of which were normal.  I reviewed his recent echocardiogram in May 2022 with normal LVEF of 60 to 65%.  At that office visit, I reduced his amlodipine from 10 mg daily to 5 mg daily.  He is allergic to ACE inhibitor's causing hives, and beta-blocker would not have been appropriate due to underlying bradycardia.  A cardiac monitor was placed on the patient.  The results were made available which revealed normal sinus rhythm with first-degree AV block with an average heart rate of 59 bpm.  Slowest heart rate during was during a period of sleep at 29 bpm.  He had rare PACs and PVCs but  no evidence of atrial fibrillation or arrhythmias to explain dizziness.  In 3 months in 3 months amlodipine dosage of 5 mg daily is listed.  Can he needs refill on the 5 mg of amlodipine.  He states that he continues to have some dizziness if he is standing in 1 position for very long it is subsiding some.  He is out walking 20 minutes a day and denies any dizziness associated with this.  He has had a tiny bit of chest discomfort when he first begins walking but it goes away very quickly.  He is looking forward to his daughter's wedding at the end of October as he will be walking her down the aisle.  He did note that he heard from his primary care physician that his testosterone level was low and his PCP will be addressing that with him.  Past Medical History:  Diagnosis Date   Acute on chronic combined systolic and diastolic CHF (congestive heart failure) ([place]) 08/22/2018   Acute on chronic respiratory failure ([place]) 04/15/2015   CAD in native artery 08/22/2018   CAP (community acquired pneumonia) 04/13/2015   See cxr 04/13/2015 > admit [facility]     Complication of anesthesia    H/O hiatal hernia    Hyperlipidemia LDL goal <70 08/22/2018   Hypertension 08/22/2018   Hypothyroidism 04/13/2015   Myasthenia gravis ([place]) 04/13/2015   Obesity 04/13/2015   Paroxysmal A-fib ([place]) 04/16/2015   S/P angioplasty with stent 08/21/18 DES to RCA  08/22/2018   Sepsis ([place]) 04/15/2015    Past Surgical History:  Procedure Laterality Date   AMPUTATION  06/11/2012   Procedure: AMPUTATION DIGIT;  Surgeon: [name], MD;  Location: [facility];  Service: Orthopedics;  Laterality: Left;  revision of amputation   CARDIOVERSION N/A 03/24/2020   Procedure: CARDIOVERSION;  Surgeon: [name], [name], MD;  Location: [facility]COPY;  Service: Cardiovascular;  Laterality: N/A;   CARDIOVERSION N/A 02/14/2021   Procedure: CARDIOVERSION;  Surgeon: [name], MD;  Location: [facility] ENDOSCOPY;  Service: Cardiovascular;  Laterality:  N/A;   CORONARY STENT INTERVENTION N/A 08/21/2018   Procedure: CORONARY STENT INTERVENTION;  Surgeon: [name], MD;  Location: [facility] CV LAB;  Service: Cardiovascular;  Laterality: N/A;   HERNIA REPAIR  1994   LEFT HEART CATH AND CORONARY ANGIOGRAPHY N/A 08/21/2018   Procedure: LEFT HEART CATH AND CORONARY ANGIOGRAPHY;  Surgeon: [name], MD;  Location: [facility];  Service: Cardiovascular;  Laterality: N/A;   ORIF SHOULDER FRACTURE  06/13/2012   Procedure: OPEN REDUCTION INTERNAL FIXATION (ORIF) SHOULDER FRACTURE;  Surgeon: [name], MD;  Location: [facility];  Service: Orthopedics;  Laterality: Left;  LEFT SHOULDER OPEN GREATER TUBEROSITY ORIF   SHOULDER CLOSED REDUCTION  06/11/2012   Procedure: CLOSED MANIPULATION SHOULDER;  Surgeon: [name], MD;  Location: [facility];  Service: Orthopedics;  Laterality: Left;     Current Outpatient Medications  Medication Sig Dispense Refill   Ascorbic Acid (VITAMIN C) 1000 MG tablet Take 1,000 mg by mouth daily.     cholecalciferol (VITAMIN D3) 25 MCG (1000 UNIT) tablet Take 1,000 Units by mouth daily.     levothyroxine (SYNTHROID) 137 MCG tablet Take 137 mcg by mouth daily before breakfast.     nitroGLYCERIN (NITROSTAT) 0.4 MG SL tablet Place 1 tablet (0.4 mg total) under the tongue every 5 (five) minutes as needed for chest pain. (Patient taking differently: Place 0.4 mg under the tongue every 5 (five) minutes x 3 doses as needed for chest pain.) 25 tablet 6   XARELTO 15 MG TABS tablet TAKE 1 TABLET(15 MG) BY MOUTH DAILY WITH SUPPER 90 tablet 1   amLODipine (NORVASC) 5 MG tablet Take 1 tablet (5 mg total) by mouth daily. 90 tablet 0   No current facility-administered medications for this visit.    Allergies:   Ace inhibitors, Beta adrenergic blockers, and Statins    Social History:  The patient  reports that he quit smoking about 39 years ago. His smoking use included cigarettes. He has a 30.00 pack-year smoking  history. He has never used smokeless tobacco. He reports current alcohol use of about 2.0 standard drinks per week. He reports that he does not use drugs.   Family History:  The patient's family history includes Healthy in his daughter, sister, and son; Heart attack in his father; Other in his mother.    ROS: All other systems are reviewed and negative. Unless otherwise mentioned in H&P    PHYSICAL EXAM: VS:  BP 118/68 (BP Location: Left Arm, Patient Position: Sitting, Cuff Size: Normal)   Pulse 70   Ht 5\' 8"  (1.727 m)   Wt 159 lb 3.2 oz (72.2 kg)   SpO2 100%   BMI 24.21 kg/m  , BMI Body mass index is 24.21 kg/m. GEN: Well nourished, well developed, in no acute distress HEENT: normal Neck: no JVD, carotid bruits, or masses Cardiac: RRR; 1/6 systolic murmurs, rubs, or gallops,no edema  Respiratory:  Clear to auscultation bilaterally, normal work of breathing GI: soft, nontender, nondistended, + BS MS: no deformity or atrophy Skin: warm and dry, no rash Neuro:  Strength and sensation are intact, some hard of hearing Psych: euthymic mood, flat affect   EKG: Not completed this office visit  Recent Labs: 02/24/2021: Hemoglobin 12.6; Platelets 141; TSH 1.150 03/14/2021: BUN 26; Creatinine, Ser 1.13; Potassium 4.8; Sodium 139    Lipid Panel    Component Value Date/Time   CHOL 128 06/05/2019 0820   TRIG 109 06/05/2019 0820   HDL 41 06/05/2019 0820   CHOLHDL 3.1 06/05/2019 0820   CHOLHDL 2.8 08/21/2018 0924   VLDL 11 08/21/2018 0924   LDLCALC 67 06/05/2019 0820      Wt Readings from Last 3 Encounters:  06/30/21 159 lb 3.2 oz (72.2 kg)  06/09/21 153 lb 3.2 oz (69.5 kg)  02/24/21 145 lb 12.8 oz (66.1 kg)      Other studies Reviewed: Cardiac Monitor 06/28/2021    Normal sinus rhythm with first degree AV block. average HR 59. Slowest HR during period of sleep Rare PACs and PVCs. No Afib No arrhythmia to explain dizziness  Echocardiogram 02/03/2021  1. Left ventricular  ejection fraction, by estimation, is 60 to 65%. The  left ventricle has normal function. The left ventricle has no regional  wall motion abnormalities. There is mild left ventricular hypertrophy.  Left ventricular diastolic function  could not be evaluated.   2. Right ventricular systolic function is normal. The right ventricular  size is normal. There is normal pulmonary artery systolic pressure. The  estimated right ventricular systolic pressure is 24.0 mmHg.   3. Left atrial size was moderately dilated.   4. Right atrial size was moderately dilated.   5. The mitral valve is degenerative. Mild mitral valve regurgitation.  Moderate mitral annular calcification.   6. The tricuspid valve is abnormal. Tricuspid valve regurgitation is  moderate.   7. The aortic valve is tricuspid. Aortic valve regurgitation is mild to  moderate. Mild aortic valve stenosis. Aortic valve area, by VTI measures  1.70 cm. Aortic valve mean gradient measures 15.2 mmHg. Aortic valve Vmax  measures 2.62 m/s.   8. Aortic dilatation noted. There is mild dilatation of the ascending  aorta, measuring 40 mm.   9. The inferior vena cava is dilated in size with >50% respiratory  variability, suggesting right atrial pressure of 8 mmHg.  Comparison(s): Changes from prior study are noted. 03/07/2020: LVEF 60-65%,  severe biatrial enlargement, moderate TR, mild AI.   ASSESSMENT AND PLAN:  1.  Orthostatic hypotension: He is feeling much better with decreased dose of amlodipine from 10 mg to 5 mg daily.  We will allow for some permissive hypertension as he did bring a list of his blood pressures with him with those changed.  He is averaging in the 973Z to 329J systolic.  Can consider decreasing even further but I do not want to allow for too high of a blood pressure in someone his age unless he becomes symptomatic.  2.  Bradycardia: Cardiac monitor did reveal first-degree AV block with average heart rate of 59 bpm and lowest at  29 bpm while he was sleeping, no pauses or atrial fib.  He is not having any syncope, near syncope, or chest pain associated with this.  At this point I do not believe he is a candidate for a pacemaker.  If symptoms are more persistent he may need to have a conversation concerning this.  Follow-up appointment with Dr. Martinique in 3 months.  3.  CAD: No significant angina.  He continues on secondary prevention.  He is allergic to statins.  He is not on aspirin as he is also on Xarelto.  4.  Paroxysmal atrial fibrillation: Currently in sinus rhythm, cardiac monitor also did not find any evidence of atrial fibrillation during the time that he was wearing it.  We will continue Xarelto.  5. Low testosterone level: This is self-reported by the patient.  He is following up with primary care to address this.  Current medicines are reviewed at length with the patient today.  I have spent 25 minutes  dedicated to the care of this patient on the date of this encounter to include pre-visit review of records, assessment, management and diagnostic testing,with shared decision making.  Labs/ tests ordered today include: None Phill Myron. West Pugh, ANP, AACC   06/30/2021 9:41 AM    [facility] [place] [address] Office [phone] Fax [phone]  Notice: This dictation was prepared with Dragon dictation along with smaller phrase technology. Any transcriptional errors that result from this process are unintentional and may not be corrected upon review.

## 2021-06-30 ENCOUNTER — Other Ambulatory Visit: Payer: Self-pay

## 2021-06-30 ENCOUNTER — Ambulatory Visit (INDEPENDENT_AMBULATORY_CARE_PROVIDER_SITE_OTHER): Payer: Medicare Other | Admitting: Adult Health

## 2021-06-30 ENCOUNTER — Encounter: Payer: Self-pay | Admitting: Adult Health

## 2021-06-30 VITALS — BP 118/68 | HR 70 | Ht 68.0 in | Wt 159.2 lb

## 2021-06-30 DIAGNOSIS — R001 Bradycardia, unspecified: Secondary | ICD-10-CM

## 2021-06-30 DIAGNOSIS — I480 Paroxysmal atrial fibrillation: Secondary | ICD-10-CM

## 2021-06-30 DIAGNOSIS — I2510 Atherosclerotic heart disease of native coronary artery without angina pectoris: Secondary | ICD-10-CM | POA: Diagnosis not present

## 2021-06-30 DIAGNOSIS — D696 Thrombocytopenia, unspecified: Secondary | ICD-10-CM | POA: Diagnosis not present

## 2021-06-30 DIAGNOSIS — R7989 Other specified abnormal findings of blood chemistry: Secondary | ICD-10-CM

## 2021-06-30 DIAGNOSIS — I10 Essential (primary) hypertension: Secondary | ICD-10-CM

## 2021-06-30 DIAGNOSIS — Z95820 Peripheral vascular angioplasty status with implants and grafts: Secondary | ICD-10-CM | POA: Diagnosis not present

## 2021-06-30 MED ORDER — AMLODIPINE BESYLATE 5 MG PO TABS: 5.0000 mg | ORAL_TABLET | Freq: Every day | ORAL | 0 refills | Status: DC

## 2021-06-30 NOTE — Patient Instructions (Signed)
Medication Instructions:  The current medical regimen is effective;  continue present plan and medications as directed. Please refer to the Current Medication list given to you today.   *If you need a refill on your cardiac medications before your next appointment, please call your pharmacy*  Lab Work:   Testing/Procedures:  NONE    NONE  Follow-Up: Your next appointment:  3 month(s) In Person with Peter Martinique, MD   At [facility], you and your health needs are our priority.  As part of our continuing mission to provide you with exceptional heart care, we have created designated Provider Care Teams.  These Care Teams include your primary Cardiologist (physician) and Advanced Practice Providers (APPs -  Physician Assistants and Nurse Practitioners) who all work together to provide you with the care you need, when you need it.

## 2021-07-18 ENCOUNTER — Other Ambulatory Visit: Payer: Self-pay | Admitting: Physical Medicine and Rehabilitation

## 2021-07-18 DIAGNOSIS — I119 Hypertensive heart disease without heart failure: Secondary | ICD-10-CM

## 2021-07-24 ENCOUNTER — Ambulatory Visit (INDEPENDENT_AMBULATORY_CARE_PROVIDER_SITE_OTHER): Payer: Medicare Other

## 2021-07-24 ENCOUNTER — Other Ambulatory Visit: Payer: Self-pay

## 2021-07-24 DIAGNOSIS — E538 Deficiency of other specified B group vitamins: Secondary | ICD-10-CM | POA: Diagnosis not present

## 2021-07-24 MED ORDER — CYANOCOBALAMIN 1000 MCG/ML IJ SOLN
1000.0000 ug | Freq: Once | INTRAMUSCULAR | Status: AC
  Administered 2021-07-24: 1000 ug via INTRAMUSCULAR

## 2021-07-31 DIAGNOSIS — R7989 Other specified abnormal findings of blood chemistry: Secondary | ICD-10-CM | POA: Diagnosis not present

## 2021-07-31 DIAGNOSIS — I4892 Unspecified atrial flutter: Secondary | ICD-10-CM | POA: Diagnosis not present

## 2021-07-31 DIAGNOSIS — I700 Atherosclerosis of aorta: Secondary | ICD-10-CM | POA: Diagnosis not present

## 2021-07-31 DIAGNOSIS — N401 Enlarged prostate with lower urinary tract symptoms: Secondary | ICD-10-CM | POA: Diagnosis not present

## 2021-07-31 DIAGNOSIS — N1831 Chronic kidney disease, stage 3a: Secondary | ICD-10-CM | POA: Diagnosis not present

## 2021-07-31 DIAGNOSIS — M8580 Other specified disorders of bone density and structure, unspecified site: Secondary | ICD-10-CM | POA: Diagnosis not present

## 2021-07-31 DIAGNOSIS — D696 Thrombocytopenia, unspecified: Secondary | ICD-10-CM | POA: Diagnosis not present

## 2021-07-31 DIAGNOSIS — E039 Hypothyroidism, unspecified: Secondary | ICD-10-CM | POA: Diagnosis not present

## 2021-07-31 DIAGNOSIS — I10 Essential (primary) hypertension: Secondary | ICD-10-CM | POA: Diagnosis not present

## 2021-07-31 DIAGNOSIS — E785 Hyperlipidemia, unspecified: Secondary | ICD-10-CM | POA: Diagnosis not present

## 2021-07-31 DIAGNOSIS — Z23 Encounter for immunization: Secondary | ICD-10-CM | POA: Diagnosis not present

## 2021-07-31 DIAGNOSIS — G7000 Myasthenia gravis without (acute) exacerbation: Secondary | ICD-10-CM | POA: Diagnosis not present

## 2021-08-04 ENCOUNTER — Other Ambulatory Visit: Payer: Self-pay | Admitting: Physical Medicine and Rehabilitation

## 2021-08-07 ENCOUNTER — Inpatient Hospital Stay: Admission: RE | Admit: 2021-08-07 | Payer: Medicare Other | Source: Ambulatory Visit

## 2021-08-13 ENCOUNTER — Other Ambulatory Visit: Payer: Self-pay | Admitting: Adult Health

## 2021-08-18 ENCOUNTER — Other Ambulatory Visit: Payer: Self-pay | Admitting: Adult Health

## 2021-08-23 ENCOUNTER — Other Ambulatory Visit: Payer: Self-pay

## 2021-08-23 MED ORDER — RIVAROXABAN 15 MG PO TABS: ORAL_TABLET | ORAL | 1 refills | Status: DC

## 2021-08-23 NOTE — Telephone Encounter (Signed)
Prescription refill request for Xarelto received.  Indication:Afib Last office visit:9/22 Weight:72.2 kg Age:87 Scr:1.3 CrCl:41.65 ml/min  Prescription refilled

## 2021-08-28 ENCOUNTER — Ambulatory Visit (INDEPENDENT_AMBULATORY_CARE_PROVIDER_SITE_OTHER): Payer: Medicare Other

## 2021-08-28 ENCOUNTER — Other Ambulatory Visit: Payer: Self-pay

## 2021-08-28 DIAGNOSIS — E538 Deficiency of other specified B group vitamins: Secondary | ICD-10-CM | POA: Diagnosis not present

## 2021-08-28 MED ORDER — CYANOCOBALAMIN 1000 MCG/ML IJ SOLN
1000.0000 ug | Freq: Once | INTRAMUSCULAR | Status: AC
Start: 2021-08-28 — End: 2021-08-28
  Administered 2021-08-28: 08:00:00 1000 ug via INTRAMUSCULAR

## 2021-09-04 ENCOUNTER — Other Ambulatory Visit: Payer: Self-pay

## 2021-09-04 MED ORDER — RIVAROXABAN 15 MG PO TABS: ORAL_TABLET | ORAL | 1 refills | Status: DC

## 2021-09-22 ENCOUNTER — Telehealth: Payer: Self-pay | Admitting: Cardiology

## 2021-09-22 ENCOUNTER — Ambulatory Visit (INDEPENDENT_AMBULATORY_CARE_PROVIDER_SITE_OTHER): Payer: Medicare Other | Admitting: Adult Health

## 2021-09-22 ENCOUNTER — Other Ambulatory Visit: Payer: Self-pay

## 2021-09-22 ENCOUNTER — Encounter: Payer: Self-pay | Admitting: Adult Health

## 2021-09-22 VITALS — BP 172/74 | HR 58 | Ht 68.0 in | Wt 165.2 lb

## 2021-09-22 DIAGNOSIS — I4892 Unspecified atrial flutter: Secondary | ICD-10-CM | POA: Diagnosis not present

## 2021-09-22 DIAGNOSIS — I10 Essential (primary) hypertension: Secondary | ICD-10-CM | POA: Diagnosis not present

## 2021-09-22 DIAGNOSIS — E7800 Pure hypercholesterolemia, unspecified: Secondary | ICD-10-CM

## 2021-09-22 DIAGNOSIS — I2510 Atherosclerotic heart disease of native coronary artery without angina pectoris: Secondary | ICD-10-CM

## 2021-09-22 MED ORDER — AMLODIPINE BESYLATE 5 MG PO TABS: 7.5000 mg | ORAL_TABLET | Freq: Every day | ORAL | 3 refills | Status: DC

## 2021-09-22 NOTE — Addendum Note (Signed)
Addended by: Betha Loa F on: 09/22/2021 04:25 PM   Modules accepted: Orders

## 2021-09-22 NOTE — Patient Instructions (Signed)
Medication Instructions:  Take amlodipine 7.5 mg (one and one-half tablets) every day.  *If you need a refill on your cardiac medications before your next appointment, please call your pharmacy*   Follow-Up: At [facility], you and your health needs are our priority.  As part of our continuing mission to provide you with exceptional heart care, we have created designated Provider Care Teams.  These Care Teams include your primary Cardiologist (physician) and Advanced Practice Providers (APPs -  Physician Assistants and Nurse Practitioners) who all work together to provide you with the care you need, when you need it.  We recommend signing up for the patient portal called "MyChart".  Sign up information is provided on this After Visit Summary.  MyChart is used to connect with patients for Virtual Visits (Telemedicine).  Patients are able to view lab/test results, encounter notes, upcoming appointments, etc.  Non-urgent messages can be sent to your provider as well.   To learn more about what you can do with MyChart, go to [email or web address].    Your next appointment:   1 week(s)  The format for your next appointment:   In Person  Provider:   Jory Sims, DNP, ANP

## 2021-09-22 NOTE — Telephone Encounter (Signed)
Appt scheduled at 310pm. Pt notified he will arrive early.

## 2021-09-22 NOTE — Telephone Encounter (Signed)
°  Per MyChart scheduling pool:  My health has deteriorated greatly the past few days.My BP has spiked to around 180-95 and Shortness of breath has reappeared.When my daughter got married I made it down the aisle with her but had to leave immediately to return to my hotel and could not participate in the r rehearsal dinner etc Up until 2 days ago I felt the same with lots of chills and brain fog but now something strange is going on.I have an appointment 12/30 but afraid to go through the weekend without seeing you.[place]

## 2021-09-22 NOTE — Telephone Encounter (Signed)
Returned call to pt he states that he refuses to go to the ER. Pt states that he is having increased BP, dizziness and SOB. Denies any weight gain or any swelling. He states that his BP is 180/89 does not remember what the HR was he states that he is not in AFIB. He states that this started when he was at his daughters wedding in October. Pt is demanding to be seen by Jory Sims, DNP today, informed pt that [facility] is full today seeing pt's. Pt refuses to go to the ER. Will forward to [facility] and Dr Martinique. Please advise.

## 2021-09-22 NOTE — Progress Notes (Signed)
Cardiology Office Note   Date:  09/22/2021   ID:  Marcus Kaufmann Sr., DOB 06-07-1932, MRN [id number]  PCP:  [name], MD  Cardiologist: Dr. Martinique  No chief complaint on file.    History of Present Illness: Marcus FLEGEL Sr. is a 87 y.o. male who presents for for ongoing assessment and management of symptomatic atrial fibrillation status post cardioversion, CAD with cardiac catheterization completed on 08/21/2018 with intervention of the RCA using a drug-eluting stent.  Patient did have residual total LAD Dr. Gwenlyn [name] attempted to cross that it appeared that the wire got some intimal and therefore the procedure was aborted.  Patient also had a successful cardioversion on 02/14/2021, and was continued on Xarelto.  He does have other history to include chronic systolic and diastolic heart failure, history of hiatal hernia, hyperlipidemia, hypertension, and hypothyroidism.  He called our office today insisting on being seen, as he was having increased blood pressure, dizziness, and shortness of breath.  He reported a blood pressure of 180/89.  He cannot tell if he is in atrial fibrillation.  He states that the symptoms began when he was at his daughter's wedding in October but seem to have worsened over the last few days.  He was advised to go to the emergency room but refused and insisted on appointment with me today.  Luckily I did have a cancellation for this afternoon.  On last office visit when I saw him on 06/09/2021 he complained of positional dizziness and had seen his PCP who discontinued diuretics.  He states that each time he stands for any period of time he gets lightheaded dizzy and foggy with some blurred vision.  EKG at that time revealed sinus bradycardia with first-degree AV block and a PR interval of 220 ms with right bundle branch block which was not new.  He comes today very anxious stating that he has been having some blurred vision has been feeling very fatigued and tired.   He states it is been getting worse since October and he was tired of putting up with that.  He states that his blood pressure has been slowly rising.  He has been medically compliant taking his amlodipine as directed.  He denies any fluid retention or swelling.  Past Medical History:  Diagnosis Date   Acute on chronic combined systolic and diastolic CHF (congestive heart failure) ([place]) 08/22/2018   Acute on chronic respiratory failure ([place]) 04/15/2015   CAD in native artery 08/22/2018   CAP (community acquired pneumonia) 04/13/2015   See cxr 04/13/2015 > admit [facility]     Complication of anesthesia    H/O hiatal hernia    Hyperlipidemia LDL goal <70 08/22/2018   Hypertension 08/22/2018   Hypothyroidism 04/13/2015   Myasthenia gravis ([place]) 04/13/2015   Obesity 04/13/2015   Paroxysmal A-fib ([name]) 04/16/2015   S/P angioplasty with stent 08/21/18 DES to RCA 08/22/2018   Sepsis ([place]) 04/15/2015    Past Surgical History:  Procedure Laterality Date   AMPUTATION  06/11/2012   Procedure: AMPUTATION DIGIT;  Surgeon: [name], MD;  Location: [place];  Service: Orthopedics;  Laterality: Left;  revision of amputation   CARDIOVERSION N/A 03/24/2020   Procedure: CARDIOVERSION;  Surgeon: [name] [name], MD;  Location: [facility] ENDOSCOPY;  Service: Cardiovascular;  Laterality: N/A;   CARDIOVERSION N/A 02/14/2021   Procedure: CARDIOVERSION;  Surgeon: [name], MD;  Location: [place];  Service: Cardiovascular;  Laterality: N/A;   CORONARY STENT INTERVENTION N/A 08/21/2018  Procedure: CORONARY STENT INTERVENTION;  Surgeon: [name], MD;  Location: [place] [facility];  Service: Cardiovascular;  Laterality: N/A;   HERNIA REPAIR  1994   LEFT HEART CATH AND CORONARY ANGIOGRAPHY N/A 08/21/2018   Procedure: LEFT HEART CATH AND CORONARY ANGIOGRAPHY;  Surgeon: [name], MD;  Location: [place] [facility];  Service: Cardiovascular;  Laterality: N/A;   ORIF SHOULDER FRACTURE  06/13/2012    Procedure: OPEN REDUCTION INTERNAL FIXATION (ORIF) SHOULDER FRACTURE;  Surgeon: [name], MD;  Location: [place];  Service: Orthopedics;  Laterality: Left;  LEFT SHOULDER OPEN GREATER TUBEROSITY ORIF   SHOULDER CLOSED REDUCTION  06/11/2012   Procedure: CLOSED MANIPULATION SHOULDER;  Surgeon: [name], MD;  Location: [place];  Service: Orthopedics;  Laterality: Left;     Current Outpatient Medications  Medication Sig Dispense Refill   amLODipine (NORVASC) 5 MG tablet Take 1 tablet (5 mg total) by mouth daily. 90 tablet 0   levothyroxine (SYNTHROID) 137 MCG tablet Take 137 mcg by mouth daily before breakfast.     Rivaroxaban (XARELTO) 15 MG TABS tablet TAKE 1 TABLET(15 MG) BY MOUTH DAILY WITH SUPPER 90 tablet 1   Ascorbic Acid (VITAMIN C) 1000 MG tablet Take 1,000 mg by mouth daily.     cholecalciferol (VITAMIN D3) 25 MCG (1000 UNIT) tablet Take 1,000 Units by mouth daily.     nitroGLYCERIN (NITROSTAT) 0.4 MG SL tablet Place 1 tablet (0.4 mg total) under the tongue every 5 (five) minutes as needed for chest pain. (Patient taking differently: Place 0.4 mg under the tongue every 5 (five) minutes x 3 doses as needed for chest pain.) 25 tablet 6   No current facility-administered medications for this visit.    Allergies:   Ace inhibitors, Beta adrenergic blockers, and Statins    Social History:  The patient  reports that he quit smoking about 39 years ago. His smoking use included cigarettes. He has a 30.00 pack-year smoking history. He has never used smokeless tobacco. He reports current alcohol use of about 2.0 standard drinks per week. He reports that he does not use drugs.   Family History:  The patient's family history includes Healthy in his daughter, sister, and son; Heart attack in his father; Other in his mother.    ROS: All other systems are reviewed and negative. Unless otherwise mentioned in H&P    PHYSICAL EXAM: VS:  BP (!) 172/74 (BP Location: Left Arm, Patient Position:  Sitting, Cuff Size: Normal)    Pulse (!) 58    Ht 5\' 8"  (1.727 m)    Wt 165 lb 3.2 oz (74.9 kg)    SpO2 99%    BMI 25.12 kg/m  , BMI Body mass index is 25.12 kg/m. GEN: Well nourished, well developed, in no acute distress HEENT: normal, conjunctive erythema Neck: no JVD, carotid bruits, or masses Cardiac: RRR; 1/6 systolic murmurs, rubs, or gallops,no edema  Respiratory:  Clear to auscultation bilaterally, normal work of breathing GI: soft, nontender, nondistended, + BS MS: no deformity or atrophy Skin: warm and dry, no rash Neuro:  Strength and sensation are intact Psych: euthymic mood, flat affect   EKG:  EKG is ordered today. The ekg ordered today demonstrates atrial flutter with 41 AV conduction with a left axis deviation and right bundle branch block.   Recent Labs: 02/24/2021: Hemoglobin 12.6; Platelets 141; TSH 1.150 03/14/2021: BUN 26; Creatinine, Ser 1.13; Potassium 4.8; Sodium 139    Lipid Panel  Component Value Date/Time   CHOL 128 06/05/2019 0820   TRIG 109 06/05/2019 0820   HDL 41 06/05/2019 0820   CHOLHDL 3.1 06/05/2019 0820   CHOLHDL 2.8 08/21/2018 0924   VLDL 11 08/21/2018 0924   LDLCALC 67 06/05/2019 0820      Wt Readings from Last 3 Encounters:  09/22/21 165 lb 3.2 oz (74.9 kg)  06/30/21 159 lb 3.2 oz (72.2 kg)  06/09/21 153 lb 3.2 oz (69.5 kg)      Other studies Reviewed: Echocardiogram Mar 01, 2021 1. Left ventricular ejection fraction, by estimation, is 60 to 65%. The  left ventricle has normal function. The left ventricle has no regional  wall motion abnormalities. There is mild left ventricular hypertrophy.  Left ventricular diastolic function  could not be evaluated.   2. Right ventricular systolic function is normal. The right ventricular  size is normal. There is normal pulmonary artery systolic pressure. The  estimated right ventricular systolic pressure is 09.3 mmHg.   3. Left atrial size was moderately dilated.   4. Right atrial size was  moderately dilated.   5. The mitral valve is degenerative. Mild mitral valve regurgitation.  Moderate mitral annular calcification.   6. The tricuspid valve is abnormal. Tricuspid valve regurgitation is  moderate.   7. The aortic valve is tricuspid. Aortic valve regurgitation is mild to  moderate. Mild aortic valve stenosis. Aortic valve area, by VTI measures  1.70 cm. Aortic valve mean gradient measures 15.2 mmHg. Aortic valve Vmax  measures 2.62 m/s.   8. Aortic dilatation noted. There is mild dilatation of the ascending  aorta, measuring 40 mm.   9. The inferior vena cava is dilated in size with >50% respiratory  variability, suggesting right atrial pressure of 8 mmHg.   ASSESSMENT AND PLAN:  1.  Hypertension: I rechecked his blood pressure in the clinic room and was [name] to be 168/70.  I will increase his amlodipine from 5 mg to 7.5 mg daily.  He had been up to 10 mg daily but became very lightheaded and dizzy and orthostatic therefore I will only go up a half step and recheck his blood pressure throughout the week at home.  He is to keep his appointment with me on September 29, 2021 on follow-up.  He is intolerant to ACE inhibitor's and beta-blockers.  2.  Atrial flutter: History of atrial fibrillation status post cardioversion on Feb 14, 2021.  He continues on Xarelto.  He is not on any rate control medication as he is bradycardic with heart rate in the 50s.  I will refer him to the A. fib clinic for further evaluation and medication recommendations versus permanent pacemaker (he question whether he was a candidate).  At his age I do not know that a permanent pacemaker would be the best option for him.  Will defer to A. fib clinic for other recommendations.  3.  Coronary artery disease: History of intervention to the RCA using a drug-eluting stent on 08/21/2018 with residual total LAD.  Unable to intervene.  He will continue secondary prevention management with blood pressure control.     4. Hyperlipidemia: He is unable to take statin therapy.  With his age I will not start PCSK9 inhibition therapy.  Can consider Zetia.  We will need to have follow-up fasting lipids and LFTs if not completed by primary care.  My most recent labs reveal an LDL of 96 in 2019.   Current medicines are reviewed at length with the patient today.  I have spent 35 min's  dedicated to the care of this patient on the date of this encounter to include pre-visit review of records, assessment, management and diagnostic testing,with shared decision making.  Labs/ tests ordered today include: None  Phill Myron. West Pugh, ANP, AACC   09/22/2021 3:54 PM    [place] [address] Office [phone] Fax [phone]  Notice: This dictation was prepared with Dragon dictation along with smaller phrase technology. Any transcriptional errors that result from this process are unintentional and may not be corrected upon review.

## 2021-09-26 ENCOUNTER — Ambulatory Visit (INDEPENDENT_AMBULATORY_CARE_PROVIDER_SITE_OTHER): Payer: Medicare Other

## 2021-09-26 ENCOUNTER — Other Ambulatory Visit: Payer: Self-pay

## 2021-09-26 DIAGNOSIS — E538 Deficiency of other specified B group vitamins: Secondary | ICD-10-CM

## 2021-09-26 MED ORDER — CYANOCOBALAMIN 1000 MCG/ML IJ SOLN
1000.0000 ug | Freq: Once | INTRAMUSCULAR | Status: AC
  Administered 2021-09-26: 08:00:00 1000 ug via INTRAMUSCULAR

## 2021-09-26 NOTE — Progress Notes (Signed)
No note needed/ error 

## 2021-09-28 NOTE — Progress Notes (Signed)
Cardiology Clinic Note   Patient Name: Marcus YANES Sr. Date of Encounter: 09/29/2021  Primary Care Provider:  [name], MD Primary Cardiologist:  Peter Martinique, MD  Patient Profile  87 year old Caucasian male with history of symptomatic atrial fibrillation status post cardioversion on 6 03/24/2020, and 02/14/2021 on Xarelto, CHADS VASC Score of 4 (CHF, Hypertension, Age >2), CAD with cardiac catheterization completed on 08/21/2018 with intervention of the RCA using DES with residual total LAD unable to have intervention, chronic systolic diastolic heart failure, hyperlipidemia, hypertension, hypothyroidism, anxiety, 23 and hiatal hernia.  Last seen in the office on 09/22/2021 due to hypertension.  He is intolerant to ACE inhibitor's and beta-blockers.  Past Medical History    Past Medical History:  Diagnosis Date   Acute on chronic combined systolic and diastolic CHF (congestive heart failure) ([place]) 08/22/2018   Acute on chronic respiratory failure ([place]) 04/15/2015   CAD in native artery 08/22/2018   CAP (community acquired pneumonia) 04/13/2015   See cxr 04/13/2015 > admit [facility]     Complication of anesthesia    H/O hiatal hernia    Hyperlipidemia LDL goal <70 08/22/2018   Hypertension 08/22/2018   Hypothyroidism 04/13/2015   Myasthenia gravis ([place]) 04/13/2015   Obesity 04/13/2015   Paroxysmal A-fib ([place]) 04/16/2015   S/P angioplasty with stent 08/21/18 DES to RCA 08/22/2018   Sepsis ([place]) 04/15/2015   Past Surgical History:  Procedure Laterality Date   AMPUTATION  06/11/2012   Procedure: AMPUTATION DIGIT;  Surgeon: [name], MD;  Location: [place];  Service: Orthopedics;  Laterality: Left;  revision of amputation   CARDIOVERSION N/A 03/24/2020   Procedure: CARDIOVERSION;  Surgeon: [name], [name], MD;  Location: [facility] ENDOSCOPY;  Service: Cardiovascular;  Laterality: N/A;   CARDIOVERSION N/A 02/14/2021   Procedure: CARDIOVERSION;  Surgeon: [name], MD;   Location: [facility] ENDOSCOPY;  Service: Cardiovascular;  Laterality: N/A;   CORONARY STENT INTERVENTION N/A 08/21/2018   Procedure: CORONARY STENT INTERVENTION;  Surgeon: [name], MD;  Location: [place] CV LAB;  Service: Cardiovascular;  Laterality: N/A;   HERNIA REPAIR  1994   LEFT HEART CATH AND CORONARY ANGIOGRAPHY N/A 08/21/2018   Procedure: LEFT HEART CATH AND CORONARY ANGIOGRAPHY;  Surgeon: [name], MD;  Location: [place] CV LAB;  Service: Cardiovascular;  Laterality: N/A;   ORIF SHOULDER FRACTURE  06/13/2012   Procedure: OPEN REDUCTION INTERNAL FIXATION (ORIF) SHOULDER FRACTURE;  Surgeon: [name], MD;  Location: [place];  Service: Orthopedics;  Laterality: Left;  LEFT SHOULDER OPEN GREATER TUBEROSITY ORIF   SHOULDER CLOSED REDUCTION  06/11/2012   Procedure: CLOSED MANIPULATION SHOULDER;  Surgeon: [name], MD;  Location: [place];  Service: Orthopedics;  Laterality: Left;    Allergies  Allergies  Allergen Reactions   Ace Inhibitors Hives   Beta Adrenergic Blockers Hives   Statins Hives    History of Present Illness    Marcus [name] is an 87 year old Caucasian male patient we are seeing on close follow-up after being seen last on 09/22/2021 for ongoing assessment and management of uncontrolled hypertension, was also found to be in atrial flutter, rate controlled in the 50's.  He remained on Xarelto.  He was referred to A. fib clinic due to multiple cardioversions in the past and symptomatic atrial fib flutter.  On the last office visit the patient's blood pressure was elevated at 168/70.    He was unable to tolerate amlodipine 10 mg daily due to lightheadedness and had  been on reduced dose of 5 mg daily.  I increased his amlodipine to 7.5 mg daily and he is here for close follow-up concerning his blood pressure control, symptomatic response, and is to bring a copy of his blood pressures with him on this visit.  It is noted that he is intolerant to ACE  inhibitors and beta-blockers.  He comes today feeling worse than he did when I saw him last week.  He is noticing his breathing status fatigue and now some lower extremity edema.  He states he just does not feel like himself anymore.  6 months ago he felt much better.  He is concerned that he may need a pacemaker due to a low heart rate causing him to feel so fatigued.  He states he is a little depressed because of how he has been feeling lately.  He denies any chest pain or significant dyspnea on exertion.  Home Medications    Current Outpatient Medications  Medication Sig Dispense Refill   amLODipine (NORVASC) 5 MG tablet Take 1.5 tablets (7.5 mg total) by mouth daily. 135 tablet 3   cholecalciferol (VITAMIN D3) 25 MCG (1000 UNIT) tablet Take 1,000 Units by mouth daily.     levothyroxine (SYNTHROID) 137 MCG tablet Take 137 mcg by mouth daily before breakfast.     Rivaroxaban (XARELTO) 15 MG TABS tablet TAKE 1 TABLET(15 MG) BY MOUTH DAILY WITH SUPPER 90 tablet 1   Ascorbic Acid (VITAMIN C) 1000 MG tablet Take 1,000 mg by mouth daily.     furosemide (LASIX) 20 MG tablet TAKE 1 TABLET(20 MG) BY MOUTH DAILY 90 tablet 3   nitroGLYCERIN (NITROSTAT) 0.4 MG SL tablet Place 1 tablet (0.4 mg total) under the tongue every 5 (five) minutes as needed for chest pain. 30 tablet 6   No current facility-administered medications for this visit.     Family History    Family History  Problem Relation Age of Onset   Other Mother        Deceased, 69   Heart attack Father        Deceased, 74   Healthy Sister    Healthy Daughter        x 4   Healthy Son        x 3   He indicated that his mother is deceased. He indicated that his father is deceased. He indicated that two of his three sisters are alive. He indicated that the status of his daughter is unknown. He indicated that the status of his son is unknown.  Social History    Social History   Socioeconomic History   Marital status: Divorced     Spouse name: Not on file   Number of children: 7   Years of education: Not on file   Highest education level: Not on file  Occupational History   Not on file  Tobacco Use   Smoking status: Former    Packs/day: 1.00    Years: 30.00    Pack years: 30.00    Types: Cigarettes    Quit date: 01/10/1982    Years since quitting: 39.7   Smokeless tobacco: Never  Vaping Use   Vaping Use: Never used  Substance and Sexual Activity   Alcohol use: Yes    Alcohol/week: 2.0 standard drinks    Types: 2 Glasses of wine per week    Comment: 3-4 per week    Drug use: No   Sexual activity: Never  Other Topics Concern  Not on file  Social History Narrative   Lives alone in a two story home.  Divorced.  Has 7 children.     Retired from [employer].     Education: some college.   Right Handed   Social Determinants of Health   Financial Resource Strain: Not on file  Food Insecurity: Not on file  Transportation Needs: Not on file  Physical Activity: Not on file  Stress: Not on file  Social Connections: Not on file  Intimate Partner Violence: Not on file     Review of Systems    General:  No chills, fever, night sweats or weight changes.  Cardiovascular:  No chest pain, dyspnea on exertion, edema, orthopnea, palpitations, paroxysmal nocturnal dyspnea. Dermatological: No rash, lesions/masses Respiratory: No cough, dyspnea Urologic: No hematuria, dysuria Abdominal:   No nausea, vomiting, diarrhea, bright red blood per rectum, melena, or hematemesis Neurologic:  No visual changes, wkns, changes in mental status. All other systems reviewed and are otherwise negative except as noted above.     Physical Exam    VS:  BP (!) 178/80 (BP Location: Left Arm, Patient Position: Sitting, Cuff Size: Normal)    Pulse (!) 54    Ht 5\' 8"  (1.727 m)    Wt 163 lb (73.9 kg)    SpO2 99%    BMI 24.78 kg/m  , BMI Body mass index is 24.78 kg/m.     GEN: Well nourished, well developed, in no acute  distress. HEENT: normal. Neck: Supple, no JVD, carotid bruits, or masses. Cardiac: RRR, no murmurs, rubs, or gallops. No clubbing, cyanosis, 1+ pitting edema.  Radials/DP/PT 2+ and equal bilaterally.  Respiratory:  Respirations regular and unlabored, clear to auscultation bilaterally.No wheezes GI: Soft, nontender, nondistended, BS + x 4. MS: no deformity or atrophy. Skin: warm and dry, no rash. Neuro:  Strength and sensation are intact. Psych: Normal affect.  Accessory Clinical Findings   ECG personally reviewed by me today-  Atrial flutter with 2:1 conduction HR of 54 bpm. RBBB.- No acute changes  Lab Results  Component Value Date   WBC 6.3 02/24/2021   HGB 12.6 (L) 02/24/2021   HCT 38.0 02/24/2021   MCV 94 02/24/2021   PLT 141 (L) 02/24/2021   Lab Results  Component Value Date   CREATININE 1.13 03/14/2021   BUN 26 03/14/2021   NA 139 03/14/2021   K 4.8 03/14/2021   CL 106 03/14/2021   CO2 22 03/14/2021   Lab Results  Component Value Date   ALT 9 06/05/2019   AST 16 06/05/2019   ALKPHOS 135 (H) 06/05/2019   BILITOT 0.3 06/05/2019   Lab Results  Component Value Date   CHOL 128 06/05/2019   HDL 41 06/05/2019   LDLCALC 67 06/05/2019   TRIG 109 06/05/2019   CHOLHDL 3.1 06/05/2019    No results found for: HGBA1C  Review of Prior Studies: Left Heart Cath with Intervention 08/21/2018 Prox RCA lesion is 95% stenosed. Ost Cx lesion is 60% stenosed. Prox Cx lesion is 60% stenosed. Prox LAD lesion is 100% stenosed. A drug-eluting stent was successfully placed. Post intervention, there is a 0% residual stenosis. There is moderate left ventricular systolic dysfunction. LV end diastolic pressure is mildly elevated. The left ventricular ejection fraction is 35-45% by visual estimate.  Echocardiogram 02/03/2021 1. Left ventricular ejection fraction, by estimation, is 60 to 65%. The  left ventricle has normal function. The left ventricle has no regional  wall motion  abnormalities. There  is mild left ventricular hypertrophy.  Left ventricular diastolic function  could not be evaluated.   2. Right ventricular systolic function is normal. The right ventricular  size is normal. There is normal pulmonary artery systolic pressure. The  estimated right ventricular systolic pressure is 86.7 mmHg.   3. Left atrial size was moderately dilated.   4. Right atrial size was moderately dilated.   5. The mitral valve is degenerative. Mild mitral valve regurgitation.  Moderate mitral annular calcification.   6. The tricuspid valve is abnormal. Tricuspid valve regurgitation is  moderate.   7. The aortic valve is tricuspid. Aortic valve regurgitation is mild to  moderate. Mild aortic valve stenosis. Aortic valve area, by VTI measures  1.70 cm. Aortic valve mean gradient measures 15.2 mmHg. Aortic valve Vmax  measures 2.62 m/s.   8. Aortic dilatation noted. There is mild dilatation of the ascending  aorta, measuring 40 mm.   9. The inferior vena cava is dilated in size with >50% respiratory  variability, suggesting right atrial pressure of 8 mmHg.   Assessment & Plan   1.  Hypertension: Blood pressures not controlled today despite increased dose of amlodipine.  He does have some lower extremity edema which may be related to the increased dose.  However I will begin him on Lasix 20 mg daily to assist with edema and hopefully decrease blood pressure.  He did not tolerate full dose of amlodipine 10 mg daily due to profound dizziness.  May need to consider another regimen as he does not tolerate ACE, ARB, or beta-blockers.  I will repeat his echocardiogram.  Although echo was is completed in May 2022 his symptoms have worsened, would like to evaluate for changes.  2.  CAD: Becoming more fatigued without any chest pain, some shortness of breath.  Most recent cardiac catheterization in 2019 with PCI to the RCA and LAD.  Due to his symptoms of chronic shortness of breath and  uncontrolled hypertension, I will schedule him for a nuclear medicine exercise stress test.  He is requested a cardiac CTA but with stents this is not possible and I have explained this to him.  He is not in favor of repeating cardiac catheterization until test results require this.  I do not think he needs another cath as his only symptoms are fatigue and shortness of breath.  No changes in his medication regimen at this time.  We will wait for his response to the Lasix from above.    3.  Chronic diastolic CHF: Adding Lasix 20 mg daily to his regimen.  Repeating echo to evaluate for changes. BMET in 2 weeks.  He is to weigh himself daily and also takes a blood pressure daily and bring that list back with him on next visit.  4.  Bradycardia: At age 32 I am not certain that he would be a candidate for permanent pacemaker.  But if he becomes severely symptomatic, having pauses, or chest pain, I will discuss with his primary cardiologist Dr. Martinique.  He is not on any rate reducing medications at this time.  5. Chronic Fatigue: Uncertain if his fatigue is related to bradycardia versus anemia versus CAD as this has been chronic for him.  I am ordering CBC to evaluate for anemia.  Current medicines are reviewed at length with the patient today.  I have spent 25 min's  dedicated to the care of this patient on the date of this encounter to include pre-visit review of records, assessment, management  and diagnostic testing,with shared decision making.  Signed, Phill Myron. West Pugh, ANP, AACC   09/29/2021 12:23 PM    [facility] [place] [address] Office [phone] Fax [phone]  Notice: This dictation was prepared with Dragon dictation along with smaller phrase technology. Any transcriptional errors that result from this process are unintentional and may not be corrected upon review.

## 2021-09-29 ENCOUNTER — Ambulatory Visit (INDEPENDENT_AMBULATORY_CARE_PROVIDER_SITE_OTHER): Payer: Medicare Other | Admitting: Adult Health

## 2021-09-29 ENCOUNTER — Other Ambulatory Visit: Payer: Self-pay | Admitting: Adult Health

## 2021-09-29 ENCOUNTER — Other Ambulatory Visit: Payer: Self-pay

## 2021-09-29 ENCOUNTER — Encounter: Payer: Self-pay | Admitting: Adult Health

## 2021-09-29 VITALS — BP 178/80 | HR 54 | Ht 68.0 in | Wt 163.0 lb

## 2021-09-29 DIAGNOSIS — E7800 Pure hypercholesterolemia, unspecified: Secondary | ICD-10-CM | POA: Diagnosis not present

## 2021-09-29 DIAGNOSIS — Z95820 Peripheral vascular angioplasty status with implants and grafts: Secondary | ICD-10-CM | POA: Diagnosis not present

## 2021-09-29 DIAGNOSIS — R5382 Chronic fatigue, unspecified: Secondary | ICD-10-CM | POA: Diagnosis not present

## 2021-09-29 DIAGNOSIS — R001 Bradycardia, unspecified: Secondary | ICD-10-CM | POA: Diagnosis not present

## 2021-09-29 DIAGNOSIS — I10 Essential (primary) hypertension: Secondary | ICD-10-CM | POA: Diagnosis not present

## 2021-09-29 DIAGNOSIS — I2510 Atherosclerotic heart disease of native coronary artery without angina pectoris: Secondary | ICD-10-CM

## 2021-09-29 DIAGNOSIS — I4892 Unspecified atrial flutter: Secondary | ICD-10-CM | POA: Diagnosis not present

## 2021-09-29 MED ORDER — FUROSEMIDE 20 MG PO TABS: 20.0000 mg | ORAL_TABLET | Freq: Every day | ORAL | 1 refills | Status: DC

## 2021-09-29 MED ORDER — NITROGLYCERIN 0.4 MG SL SUBL: 0.4000 mg | SUBLINGUAL_TABLET | SUBLINGUAL | 6 refills | Status: DC | PRN

## 2021-09-29 NOTE — Patient Instructions (Addendum)
Medication Instructions:  Start furosemide 20 mg each day.  *If you need a refill on your cardiac medications before your next appointment, please call your pharmacy*   Lab Work: in 2 weeks BMET  If you have labs (blood work) drawn today and your tests are completely normal, you will receive your results only by: [place] (if you have MyChart) OR A paper copy in the mail If you have any lab test that is abnormal or we need to change your treatment, we will call you to review the results.   Testing/Procedures: Your physician has requested that you have an echocardiogram. Echocardiography is a painless test that uses sound waves to create images of your heart. It provides your doctor with information about the size and shape of your heart and how well your heart's chambers and valves are working. This procedure takes approximately one hour. There are no restrictions for this procedure.  Your physician has requested that you have en exercise stress myoview. For further information please visit [email or web address]. Please follow instruction sheet, as given.   Follow-Up: At [facility], you and your health needs are our priority.  As part of our continuing mission to provide you with exceptional heart care, we have created designated Provider Care Teams.  These Care Teams include your primary Cardiologist (physician) and Advanced Practice Providers (APPs -  Physician Assistants and Nurse Practitioners) who all work together to provide you with the care you need, when you need it.  We recommend signing up for the patient portal called "MyChart".  Sign up information is provided on this After Visit Summary.  MyChart is used to connect with patients for Virtual Visits (Telemedicine).  Patients are able to view lab/test results, encounter notes, upcoming appointments, etc.  Non-urgent messages can be sent to your provider as well.   To learn more about what you can do with MyChart, go to  [email or web address].    Your next appointment:   1 month(s)  The format for your next appointment:   In Person  Provider:   Peter Martinique, MD  or Jory Sims, FNP, DNP   Other Instructions Keep a blood pressure and wt diary and bring with you to your next appointment.

## 2021-10-03 ENCOUNTER — Telehealth (HOSPITAL_COMMUNITY): Payer: Self-pay | Admitting: *Deleted

## 2021-10-03 NOTE — Telephone Encounter (Signed)
Close encounter 

## 2021-10-05 ENCOUNTER — Ambulatory Visit (HOSPITAL_COMMUNITY)
Admission: RE | Admit: 2021-10-05 | Discharge: 2021-10-05 | Disposition: A | Discharge status: Home / Self Care | Payer: Medicare Other | Source: Ambulatory Visit | Attending: Cardiology | Admitting: Cardiology

## 2021-10-05 ENCOUNTER — Other Ambulatory Visit: Payer: Self-pay

## 2021-10-05 DIAGNOSIS — I2510 Atherosclerotic heart disease of native coronary artery without angina pectoris: Secondary | ICD-10-CM | POA: Insufficient documentation

## 2021-10-05 LAB — MYOCARDIAL PERFUSION IMAGING
Base ST Depression (mm): 0 mm
LV dias vol: 164 mL (ref 62–150)
LV sys vol: 76 mL
Nuc Stress EF: 54 %
Peak HR: 73 {beats}/min
Rest HR: 62 {beats}/min
Rest Nuclear Isotope Dose: 11 mCi
SDS: 6
SRS: 5
SSS: 11
ST Depression (mm): 0 mm
Stress Nuclear Isotope Dose: 30.5 mCi
TID: 1

## 2021-10-05 MED ORDER — TECHNETIUM TC 99M TETROFOSMIN IV KIT
30.5000 | PACK | Freq: Once | INTRAVENOUS | Status: AC | PRN
  Administered 2021-10-05: 30.5 via INTRAVENOUS
  Filled 2021-10-05: qty 31

## 2021-10-05 MED ORDER — TECHNETIUM TC 99M TETROFOSMIN IV KIT
11.0000 | PACK | Freq: Once | INTRAVENOUS | Status: AC | PRN
  Administered 2021-10-05: 11 via INTRAVENOUS
  Filled 2021-10-05: qty 11

## 2021-10-05 MED ORDER — AMINOPHYLLINE 25 MG/ML IV SOLN
75.0000 mg | Freq: Once | INTRAVENOUS | Status: AC
  Administered 2021-10-05: 75 mg via INTRAVENOUS

## 2021-10-05 MED ORDER — REGADENOSON 0.4 MG/5ML IV SOLN
0.4000 mg | Freq: Once | INTRAVENOUS | Status: AC
  Administered 2021-10-05: 0.4 mg via INTRAVENOUS

## 2021-10-06 ENCOUNTER — Ambulatory Visit (INDEPENDENT_AMBULATORY_CARE_PROVIDER_SITE_OTHER): Payer: Medicare Other | Admitting: Podiatry

## 2021-10-06 ENCOUNTER — Telehealth: Payer: Self-pay

## 2021-10-06 ENCOUNTER — Encounter: Payer: Self-pay | Admitting: Podiatry

## 2021-10-06 DIAGNOSIS — G629 Polyneuropathy, unspecified: Secondary | ICD-10-CM

## 2021-10-06 DIAGNOSIS — M79674 Pain in right toe(s): Secondary | ICD-10-CM

## 2021-10-06 DIAGNOSIS — L84 Corns and callosities: Secondary | ICD-10-CM

## 2021-10-06 DIAGNOSIS — E538 Deficiency of other specified B group vitamins: Secondary | ICD-10-CM

## 2021-10-06 DIAGNOSIS — Q828 Other specified congenital malformations of skin: Secondary | ICD-10-CM

## 2021-10-06 DIAGNOSIS — M79675 Pain in left toe(s): Secondary | ICD-10-CM

## 2021-10-06 DIAGNOSIS — B351 Tinea unguium: Secondary | ICD-10-CM | POA: Diagnosis not present

## 2021-10-06 NOTE — Telephone Encounter (Addendum)
Called patient regarding results regarding stress test. Patient advised of results. Patient scheduled for f/u visit on 10/11/2021 with Dr. Harl Bowie patient was advised of appointment and had understanding of results.----- Message from Lendon Colonel, NP sent at 10/06/2021  7:37 AM EST ----- I have reviewed stress test results.  They are abnormal and patient will need to be brought in to discuss/plan cardiac cath. Please get him scheduled with first available APP or cardiologist.   [name]

## 2021-10-10 NOTE — Progress Notes (Signed)
°  Subjective:  Patient ID: Marcus Kaufmann Sr., male    DOB: 1932/06/28,  MRN: [id number]  Marcus Kaufmann Sr. presents to clinic today for at risk foot care with history of peripheral neuropathy and painful porokeratotic lesion(s) bilaterally and painful mycotic toenails that limit ambulation. Painful toenails interfere with ambulation. Aggravating factors include wearing enclosed shoe gear. Pain is relieved with periodic professional debridement. Painful porokeratotic lesions are aggravated when weightbearing with and without shoegear. Pain is relieved with periodic professional debridement.  Patient relates no new pedal problems on today's visit.  PCP is Marcus Bowen, MD , and last visit was 07/31/2021.  Allergies  Allergen Reactions   Ace Inhibitors Hives   Beta Adrenergic Blockers Hives   Statins Hives    Review of Systems: Negative except as noted in the HPI. Objective:   Constitutional Marcus A Fuchs Sr. is a pleasant 88 y.o. Caucasian male, WD, WN in NAD. AAO x 3.   Vascular CFT immediate b/l LE. Palpable DP/PT pulses b/l LE. Digital hair sparse b/l. Skin temperature gradient WNL b/l. No pain with calf compression b/l. No edema noted b/l. No cyanosis or clubbing noted b/l LE.  Neurologic Normal speech. Oriented to person, place, and time. Pt has subjective symptoms of neuropathy. Protective sensation intact 5/5 intact bilaterally with 10g monofilament b/l. Vibratory sensation intact b/l.  Dermatologic Pedal integument with normal turgor, texture and tone BLE. No open wounds b/l LE. No interdigital macerations noted b/l LE. Toenails 1-5 b/l elongated, discolored, dystrophic, thickened, crumbly with subungual debris and tenderness to dorsal palpation. Porokeratotic lesion(s) submet head 5 right foot. No erythema, no edema, no drainage, no fluctuance. Preulcerative lesion noted submet head 5 left foot. There is visible subdermal hemorrhage. There is no surrounding erythema, no edema,  no drainage, no odor, no fluctuance.  Orthopedic: Normal muscle strength 5/5 to all lower extremity muscle groups bilaterally. HAV with bunion deformity noted b/l LE.Marland Kitchen No pain, crepitus or joint limitation noted with ROM b/l LE.  Patient ambulates independently without assistive aids. Wearing gray [employer] loafers.   Radiographs: None   Assessment:   1. Pain due to onychomycosis of toenails of both feet   2. Porokeratosis   3. Pre-ulcerative calluses   4. B12 deficiency   5. Neuropathy    Plan:  Patient was evaluated and treated and all questions answered. Consent given for treatment as described below: -Examined patient. -Mycotic toenails 1-5 bilaterally were debrided in length and girth with sterile nail nippers and dremel without incident. -Preulcerative lesion pared submet head 5 left foot. Total number pared=1. -Painful porokeratotic lesion(s) submet head 5 right foot pared and enucleated with sterile scalpel blade without incident. Total number of lesions debrided=1. -Patient/POA to call should there be question/concern in the interim.  Return in about 3 months (around 01/04/2022).  Marzetta Board, DPM

## 2021-10-11 ENCOUNTER — Telehealth: Payer: Self-pay

## 2021-10-11 ENCOUNTER — Encounter: Payer: Self-pay | Admitting: Internal Medicine

## 2021-10-11 ENCOUNTER — Ambulatory Visit (INDEPENDENT_AMBULATORY_CARE_PROVIDER_SITE_OTHER): Payer: Medicare Other | Admitting: Internal Medicine

## 2021-10-11 ENCOUNTER — Ambulatory Visit (HOSPITAL_COMMUNITY): Payer: Medicare Other | Attending: Adult Health

## 2021-10-11 ENCOUNTER — Other Ambulatory Visit: Payer: Self-pay

## 2021-10-11 VITALS — BP 146/72 | HR 51 | Ht 68.0 in | Wt 164.0 lb

## 2021-10-11 DIAGNOSIS — I2510 Atherosclerotic heart disease of native coronary artery without angina pectoris: Secondary | ICD-10-CM

## 2021-10-11 LAB — ECHOCARDIOGRAM COMPLETE
AR max vel: 3.25 cm2
AV Area VTI: 3 cm2
AV Area mean vel: 3.25 cm2
AV Mean grad: 14 mmHg
AV Peak grad: 25.4 mmHg
Ao pk vel: 2.52 m/s
Area-P 1/2: 4.8 cm2
P 1/2 time: 517 msec
S' Lateral: 3.4 cm

## 2021-10-11 LAB — CBC
Hematocrit: 37 % — ABNORMAL LOW (ref 37.5–51.0)
Hemoglobin: 12.4 g/dL — ABNORMAL LOW (ref 13.0–17.7)
MCH: 31.6 pg (ref 26.6–33.0)
MCHC: 33.5 g/dL (ref 31.5–35.7)
MCV: 94 fL (ref 79–97)
Platelets: 130 10*3/uL — ABNORMAL LOW (ref 150–450)
RBC: 3.92 x10E6/uL — ABNORMAL LOW (ref 4.14–5.80)
RDW: 13.1 % (ref 11.6–15.4)
WBC: 5.8 10*3/uL (ref 3.4–10.8)

## 2021-10-11 LAB — BASIC METABOLIC PANEL
BUN/Creatinine Ratio: 28 — ABNORMAL HIGH (ref 10–24)
BUN: 30 mg/dL — ABNORMAL HIGH (ref 8–27)
CO2: 21 mmol/L (ref 20–29)
Calcium: 8.9 mg/dL (ref 8.6–10.2)
Chloride: 104 mmol/L (ref 96–106)
Creatinine, Ser: 1.06 mg/dL (ref 0.76–1.27)
Glucose: 84 mg/dL (ref 70–99)
Potassium: 4.9 mmol/L (ref 3.5–5.2)
Sodium: 136 mmol/L (ref 134–144)
eGFR: 68 mL/min/{1.73_m2} (ref >59)

## 2021-10-11 NOTE — H&P (View-Only) (Signed)
Cardiology Office Note:    Date:  10/11/2021   ID:  Marcus Kaufmann Sr., DOB November 28, 1931, MRN [id number]  PCP:  [name], MD   [place] Providers Cardiologist:  Peter Martinique, MD     Referring MD: [name], MD   No chief complaint on file. Positive stress test  History of Present Illness:    Prior HPI Marcus A Prete Sr. is a 88 y.o. male who is seen for follow up CAD. He has a history of myasthenia gravis and remote PAF in the setting of pneumonia July 2016.  He presented 08/20/2018 with a non-ST elevation MI.  08/21/2018 he underwent diagnostic catheterization and intervention with a DES to his RCA.   Patient did have a residual total LAD that Dr [name] attempted to cross but it appeared the wire got subintimal and so the procedure was aborted. He also has a 60% circumflex.  Echocardiogram showed an EF of 40% with moderate to severe MR.  Though his LVEDP at cath was normal he was felt to be volume overloaded.    He was seeng Geneticist, molecular Dr.[name].   Today, he notes that ~ 10 days ago. He can't walk across the house without being short of breath. His O2 sat is normal. He has mild chest pain. He has not taken SL nitro. He uses a couple pillows to sleep. For comfort purposes. He denies LE edema. No report of bleeding. He was seen 12/30 and spect was obtained.   Cardiology Studies:  SPECT 10/05/2021  Findings are consistent with ischemia. The study is intermediate risk.   No ST deviation was noted.   LV perfusion is abnormal. There is evidence of ischemia. There is no evidence of infarction. Defect 1: There is a medium defect with severe reduction in uptake present in the apical to mid anteroseptal location(s) that is reversible. Viability is present. There is abnormal wall motion in the defect area. Consistent with ischemia.   Left ventricular function is abnormal. Global function is mildly reduced. There was a single regional abnormality. Nuclear stress EF: 54 %.  The left ventricular ejection fraction is mildly decreased (45-54%). End diastolic cavity size is normal.   Prior study not available for comparison.   TTE 08/11/2022 Left ventricular ejection fraction, by estimation, is 55%. The left ventricle has normal function. The left ventricle demonstrates regional wall motion abnormalities with severe apical and apical septal hypokinesis. There is mild left ventricular hypertrophy. Left ventricular diastolic parameters are consistent with Grade II diastolic dysfunction (pseudonormalization). The average left ventricular global longitudinal strain is -16.8 %. The global longitudinal strain is abnormal. 1. Right ventricular systolic function is normal. The right ventricular size is mildly enlarged. There is severely elevated pulmonary artery systolic pressure. The estimated right ventricular systolic pressure is 56.3 mmHg. 2. 3. Left atrial size was moderately dilated. 4. Right atrial size was severely dilated. The mitral valve is abnormal. Mild to moderate mitral valve regurgitation. No evidence of mitral stenosis. Moderate mitral annular calcification. 5. 6. Tricuspid valve regurgitation is moderate to severe. The aortic valve is tricuspid. Aortic valve regurgitation is moderate. Mild aortic valve stenosis. Aortic valve mean gradient measures 14.0 mmHg. 7. Aortic dilatation noted. There is mild dilatation of the ascending aorta, measuring 40 mm. 8. The inferior vena cava is dilated in size with <50% respiratory variability, suggesting right atrial pressure of 15 mmHg.  Past Medical History:  Diagnosis Date   Acute on chronic combined systolic and diastolic CHF (congestive heart failure) (  [place]) 08/22/2018   Acute on chronic respiratory failure ([place]) 04/15/2015   CAD in native artery 08/22/2018   CAP (community acquired pneumonia) 04/13/2015   See cxr 04/13/2015 > admit [facility]     Complication of anesthesia    H/O hiatal hernia    Hyperlipidemia  LDL goal <70 08/22/2018   Hypertension 08/22/2018   Hypothyroidism 04/13/2015   Myasthenia gravis ([name]) 04/13/2015   Obesity 04/13/2015   Paroxysmal A-fib ([place]) 04/16/2015   S/P angioplasty with stent 08/21/18 DES to RCA 08/22/2018   Sepsis ([name]) 04/15/2015    Past Surgical History:  Procedure Laterality Date   AMPUTATION  06/11/2012   Procedure: AMPUTATION DIGIT;  Surgeon: [name], MD;  Location: [place];  Service: Orthopedics;  Laterality: Left;  revision of amputation   CARDIOVERSION N/A 03/24/2020   Procedure: CARDIOVERSION;  Surgeon: [name], [name], MD;  Location: [facility] ENDOSCOPY;  Service: Cardiovascular;  Laterality: N/A;   CARDIOVERSION N/A 02/14/2021   Procedure: CARDIOVERSION;  Surgeon: [name], MD;  Location: [facility] ENDOSCOPY;  Service: Cardiovascular;  Laterality: N/A;   CORONARY STENT INTERVENTION N/A 08/21/2018   Procedure: CORONARY STENT INTERVENTION;  Surgeon: [name], MD;  Location: [place] CV LAB;  Service: Cardiovascular;  Laterality: N/A;   HERNIA REPAIR  1994   LEFT HEART CATH AND CORONARY ANGIOGRAPHY N/A 08/21/2018   Procedure: LEFT HEART CATH AND CORONARY ANGIOGRAPHY;  Surgeon: [name], MD;  Location: [place] CV LAB;  Service: Cardiovascular;  Laterality: N/A;   ORIF SHOULDER FRACTURE  06/13/2012   Procedure: OPEN REDUCTION INTERNAL FIXATION (ORIF) SHOULDER FRACTURE;  Surgeon: [name], MD;  Location: [name];  Service: Orthopedics;  Laterality: Left;  LEFT SHOULDER OPEN GREATER TUBEROSITY ORIF   SHOULDER CLOSED REDUCTION  06/11/2012   Procedure: CLOSED MANIPULATION SHOULDER;  Surgeon: [name], MD;  Location: [name];  Service: Orthopedics;  Laterality: Left;    Current Medications: Current Meds  Medication Sig   amLODipine (NORVASC) 5 MG tablet Take 1.5 tablets (7.5 mg total) by mouth daily.   furosemide (LASIX) 20 MG tablet TAKE 1 TABLET(20 MG) BY MOUTH DAILY (Patient taking differently: Take 20 mg by mouth daily as  needed for fluid.)   levothyroxine (SYNTHROID) 137 MCG tablet Take 137 mcg by mouth daily before breakfast.   nitroGLYCERIN (NITROSTAT) 0.4 MG SL tablet Place 1 tablet (0.4 mg total) under the tongue every 5 (five) minutes as needed for chest pain.   Rivaroxaban (XARELTO) 15 MG TABS tablet TAKE 1 TABLET(15 MG) BY MOUTH DAILY WITH SUPPER     Allergies:   Ace inhibitors, Beta adrenergic blockers, and Statins   Social History   Socioeconomic History   Marital status: Divorced    Spouse name: Not on file   Number of children: 7   Years of education: Not on file   Highest education level: Not on file  Occupational History   Not on file  Tobacco Use   Smoking status: Former    Packs/day: 1.00    Years: 30.00    Pack years: 30.00    Types: Cigarettes    Quit date: 01/10/1982    Years since quitting: 39.7   Smokeless tobacco: Never  Vaping Use   Vaping Use: Never used  Substance and Sexual Activity   Alcohol use: Yes    Alcohol/week: 2.0 standard drinks    Types: 2 Glasses of wine per week    Comment: 3-4 per week    Drug use:  No   Sexual activity: Never  Other Topics Concern   Not on file  Social History Narrative   Lives alone in a two story home.  Divorced.  Has 7 children.     Retired from [employer].     Education: some college.   Right Handed   Social Determinants of Health   Financial Resource Strain: Not on file  Food Insecurity: Not on file  Transportation Needs: Not on file  Physical Activity: Not on file  Stress: Not on file  Social Connections: Not on file     Family History: The patient's family history includes Healthy in his daughter, sister, and son; Heart attack in his father; Other in his mother.  ROS:   Please see the history of present illness.     All other systems reviewed and are negative.  EKGs/Labs/Other Studies Reviewed:    The following studies were reviewed today:   EKG:  EKG is  ordered today.  The ekg ordered today  demonstrates  NSR, RBBB, LAFB  Recent Labs: 02/24/2021: TSH 1.150 10/11/2021: BUN 30; Creatinine, Ser 1.06; Hemoglobin WILL FOLLOW; Platelets WILL FOLLOW; Potassium 4.9; Sodium 136  Recent Lipid Panel    Component Value Date/Time   CHOL 128 06/05/2019 0820   TRIG 109 06/05/2019 0820   HDL 41 06/05/2019 0820   CHOLHDL 3.1 06/05/2019 0820   CHOLHDL 2.8 08/21/2018 0924   VLDL 11 08/21/2018 0924   LDLCALC 67 06/05/2019 0820     Risk Assessment/Calculations:           Physical Exam:    VS:    Vitals:   10/11/21 1009  BP: (!) 146/72  Pulse: (!) 51  SpO2: 100%     Wt Readings from Last 3 Encounters:  10/11/21 164 lb (74.4 kg)  10/05/21 163 lb (73.9 kg)  09/29/21 163 lb (73.9 kg)     GEN:  Well nourished, well developed in no acute distress HEENT: Normal NECK: No JVD LYMPHATICS: No lymphadenopathy CARDIAC: RRR, no murmurs, rubs, gallops RESPIRATORY:  Clear to auscultation without rales, wheezing or rhonchi  ABDOMEN:non-distended MUSCULOSKELETAL:  No edema;  left thumb amputation SKIN: Warm and dry NEUROLOGIC:  Alert and oriented x 3 PSYCHIATRIC:  Normal affect   ASSESSMENT:    #Ischemic Heart disease. Last cath 2019 showed 95% RCA s/p PCI. Mid LAD CTO was attempted with plan  to consider revascularization electively. With inducible ischemia and symptoms, opt to have him return for [facility] with consideration for CTO intervention.  - Full Code - Right radial - crt 1.06 mg/dL -Hold xarelto , last dose 08/09/2022 -on PRN lasix - not on BB or ACE I due to intolerance  - not on antiplatelet with AC  #Atrial Fibrillation: on xarelto. In sinus rhythm  #HTN- continue norvasc 5 mg daily  The patient understands that risks included but are not limited to stroke (1 in 1000), death (1 in 1000), kidney failure [usually temporary] (1 in 500), bleeding (1 in 200), allergic reaction [possibly serious] (1 in 200).     PLAN:    In order of problems listed above:  LHC  scheduled Follow up with primary cardiology team 11/03/2021      Shared Decision Making/Informed Consent The risks [stroke (1 in 1000), death (1 in 1000), kidney failure [usually temporary] (1 in 500), bleeding (1 in 200), allergic reaction [possibly serious] (1 in 200)], benefits (diagnostic support and management of coronary artery disease) and alternatives of a cardiac catheterization were discussed  in detail with Mr. Derksen and he is willing to proceed.    Medication Adjustments/Labs and Tests Ordered: Current medicines are reviewed at length with the patient today.  Concerns regarding medicines are outlined above.  Orders Placed This Encounter  Procedures   Basic metabolic panel   CBC   EKG 12-Lead   No orders of the defined types were placed in this encounter.   Patient Instructions  Medication Instructions:  PLEASE HOLD [place] AFTER HEART CATH. DO NOT TAKE ANY XARELTO TONIGHT 1/11 or Thursday 1/12.  *If you need a refill on your cardiac medications before your next appointment, please call your pharmacy*  Lab Work: [place]  If you have labs (blood work) drawn today and your tests are completely normal, you will receive your results only by: [place] (if you have MyChart) OR A paper copy in the mail If you have any lab test that is abnormal or we need to change your treatment, we will call you to review the results.  Testing/Procedures: Your physician has requested that you have a cardiac catheterization. Cardiac catheterization is used to diagnose and/or treat various heart conditions. Doctors may recommend this procedure for a number of different reasons. The most common reason is to evaluate chest pain. Chest pain can be a symptom of coronary artery disease (CAD), and cardiac catheterization can show whether plaque is narrowing or blocking your hearts arteries. This procedure is also used to evaluate the valves, as well as measure the blood flow and oxygen  levels in different parts of your heart. For further information please visit [email or web address]. Please follow instruction sheet, as given.  Follow-Up: At [facility], you and your health needs are our priority.  As part of our continuing mission to provide you with exceptional heart care, we have created designated Provider Care Teams.  These Care Teams include your primary Cardiologist (physician) and Advanced Practice Providers (APPs -  Physician Assistants and Nurse Practitioners) who all work together to provide you with the care you need, when you need it.  Your next appointment:   2 week(s)  The format for your next appointment:   In Person  Provider: ANY APP  Other Instructions  [facility] [address] [address] Dept: [phone] Loc: [place].  10/11/2021  You are scheduled for a Cardiac Catheterization on Friday, January 13 with Dr. Lauree Chandler.  1. Please arrive at the [facility] (Main Entrance A) at [facility]: [address] [place], [place] [address] at 8:30 AM (This time is two hours before your procedure to ensure your preparation). Free valet parking service is available.   Special note: Every effort is made to have your procedure done on time. Please understand that emergencies sometimes delay scheduled procedures.  2. Diet: Do not eat solid foods after midnight.  The patient may have clear liquids until 5am upon the day of the procedure.  3. Labs: You will need to have blood drawn on Wednesday, January 11 at [facility]  Open: [place] (Lunch 12:30 - 1:30)   Phone: [phone]. You do not need to be fasting.  4. Medication instructions in preparation for your procedure:   Contrast Allergy: No  Stop taking Xarelto (Rivaroxaban) on Wednesday, January 11.  On the morning of  your procedure, take your Aspirin and any morning medicines NOT listed above.  You  may use sips of water.  5. Plan for one night stay--bring personal belongings. 6. Bring a current list of your medications and current insurance cards. 7. You MUST have a responsible person to drive you home. 8. Someone MUST be with you the first 24 hours after you arrive home or your discharge will be delayed. 9. Please wear clothes that are easy to get on and off and wear slip-on shoes.  Thank you for allowing [place] to care for you!   -- [facility]    Signed, Janina Mayo, MD  10/11/2021 6:04 PM    [place]

## 2021-10-11 NOTE — Progress Notes (Addendum)
Cardiology Office Note:    Date:  10/11/2021   ID:  Marcus Kaufmann Sr., DOB September 15, 1932, MRN [id number]  PCP:  [name], MD   [place] Providers Cardiologist:  Peter Martinique, MD     Referring MD: [name], MD   No chief complaint on file. Positive stress test  History of Present Illness:    Prior HPI Marcus A Buswell Sr. is a 88 y.o. male who is seen for follow up CAD. He has a history of myasthenia gravis and remote PAF in the setting of pneumonia July 2016.  He presented 08/20/2018 with a non-ST elevation MI.  08/21/2018 he underwent diagnostic catheterization and intervention with a DES to his RCA.   Patient did have a residual total LAD that Dr [name] attempted to cross but it appeared the wire got subintimal and so the procedure was aborted. He also has a 60% circumflex.  Echocardiogram showed an EF of 40% with moderate to severe MR.  Though his LVEDP at cath was normal he was felt to be volume overloaded.    He was seeng Geneticist, molecular Dr.[name].   Today, he notes that ~ 10 days ago. He can't walk across the house without being short of breath. His O2 sat is normal. He has mild chest pain. He has not taken SL nitro. He uses a couple pillows to sleep. For comfort purposes. He denies LE edema. No report of bleeding. He was seen 12/30 and spect was obtained.   Cardiology Studies:  SPECT 10/05/2021  Findings are consistent with ischemia. The study is intermediate risk.   No ST deviation was noted.   LV perfusion is abnormal. There is evidence of ischemia. There is no evidence of infarction. Defect 1: There is a medium defect with severe reduction in uptake present in the apical to mid anteroseptal location(s) that is reversible. Viability is present. There is abnormal wall motion in the defect area. Consistent with ischemia.   Left ventricular function is abnormal. Global function is mildly reduced. There was a single regional abnormality. Nuclear stress EF: 54 %.  The left ventricular ejection fraction is mildly decreased (45-54%). End diastolic cavity size is normal.   Prior study not available for comparison.   TTE 08/11/2022 Left ventricular ejection fraction, by estimation, is 55%. The left ventricle has normal function. The left ventricle demonstrates regional wall motion abnormalities with severe apical and apical septal hypokinesis. There is mild left ventricular hypertrophy. Left ventricular diastolic parameters are consistent with Grade II diastolic dysfunction (pseudonormalization). The average left ventricular global longitudinal strain is -16.8 %. The global longitudinal strain is abnormal. 1. Right ventricular systolic function is normal. The right ventricular size is mildly enlarged. There is severely elevated pulmonary artery systolic pressure. The estimated right ventricular systolic pressure is 27.0 mmHg. 2. 3. Left atrial size was moderately dilated. 4. Right atrial size was severely dilated. The mitral valve is abnormal. Mild to moderate mitral valve regurgitation. No evidence of mitral stenosis. Moderate mitral annular calcification. 5. 6. Tricuspid valve regurgitation is moderate to severe. The aortic valve is tricuspid. Aortic valve regurgitation is moderate. Mild aortic valve stenosis. Aortic valve mean gradient measures 14.0 mmHg. 7. Aortic dilatation noted. There is mild dilatation of the ascending aorta, measuring 40 mm. 8. The inferior vena cava is dilated in size with <50% respiratory variability, suggesting right atrial pressure of 15 mmHg.  Past Medical History:  Diagnosis Date   Acute on chronic combined systolic and diastolic CHF (congestive heart failure) (  [place]) 08/22/2018   Acute on chronic respiratory failure ([place]) 04/15/2015   CAD in native artery 08/22/2018   CAP (community acquired pneumonia) 04/13/2015   See cxr 04/13/2015 > admit [facility]     Complication of anesthesia    H/O hiatal hernia    Hyperlipidemia  LDL goal <70 08/22/2018   Hypertension 08/22/2018   Hypothyroidism 04/13/2015   Myasthenia gravis ([place]) 04/13/2015   Obesity 04/13/2015   Paroxysmal A-fib ([place]) 04/16/2015   S/P angioplasty with stent 08/21/18 DES to RCA 08/22/2018   Sepsis ([place]) 04/15/2015    Past Surgical History:  Procedure Laterality Date   AMPUTATION  06/11/2012   Procedure: AMPUTATION DIGIT;  Surgeon: [name], MD;  Location: [place];  Service: Orthopedics;  Laterality: Left;  revision of amputation   CARDIOVERSION N/A 03/24/2020   Procedure: CARDIOVERSION;  Surgeon: [name], [name], MD;  Location: [facility] ENDOSCOPY;  Service: Cardiovascular;  Laterality: N/A;   CARDIOVERSION N/A 02/14/2021   Procedure: CARDIOVERSION;  Surgeon: [name], MD;  Location: [facility] ENDOSCOPY;  Service: Cardiovascular;  Laterality: N/A;   CORONARY STENT INTERVENTION N/A 08/21/2018   Procedure: CORONARY STENT INTERVENTION;  Surgeon: [name], MD;  Location: [place] CV LAB;  Service: Cardiovascular;  Laterality: N/A;   HERNIA REPAIR  1994   LEFT HEART CATH AND CORONARY ANGIOGRAPHY N/A 08/21/2018   Procedure: LEFT HEART CATH AND CORONARY ANGIOGRAPHY;  Surgeon: [name], MD;  Location: [place] CV LAB;  Service: Cardiovascular;  Laterality: N/A;   ORIF SHOULDER FRACTURE  06/13/2012   Procedure: OPEN REDUCTION INTERNAL FIXATION (ORIF) SHOULDER FRACTURE;  Surgeon: [name], MD;  Location: [place];  Service: Orthopedics;  Laterality: Left;  LEFT SHOULDER OPEN GREATER TUBEROSITY ORIF   SHOULDER CLOSED REDUCTION  06/11/2012   Procedure: CLOSED MANIPULATION SHOULDER;  Surgeon: [name], MD;  Location: [place];  Service: Orthopedics;  Laterality: Left;    Current Medications: Current Meds  Medication Sig   amLODipine (NORVASC) 5 MG tablet Take 1.5 tablets (7.5 mg total) by mouth daily.   furosemide (LASIX) 20 MG tablet TAKE 1 TABLET(20 MG) BY MOUTH DAILY (Patient taking differently: Take 20 mg by mouth daily as  needed for fluid.)   levothyroxine (SYNTHROID) 137 MCG tablet Take 137 mcg by mouth daily before breakfast.   nitroGLYCERIN (NITROSTAT) 0.4 MG SL tablet Place 1 tablet (0.4 mg total) under the tongue every 5 (five) minutes as needed for chest pain.   Rivaroxaban (XARELTO) 15 MG TABS tablet TAKE 1 TABLET(15 MG) BY MOUTH DAILY WITH SUPPER     Allergies:   Ace inhibitors, Beta adrenergic blockers, and Statins   Social History   Socioeconomic History   Marital status: Divorced    Spouse name: Not on file   Number of children: 7   Years of education: Not on file   Highest education level: Not on file  Occupational History   Not on file  Tobacco Use   Smoking status: Former    Packs/day: 1.00    Years: 30.00    Pack years: 30.00    Types: Cigarettes    Quit date: 01/10/1982    Years since quitting: 39.7   Smokeless tobacco: Never  Vaping Use   Vaping Use: Never used  Substance and Sexual Activity   Alcohol use: Yes    Alcohol/week: 2.0 standard drinks    Types: 2 Glasses of wine per week    Comment: 3-4 per week    Drug use:  No   Sexual activity: Never  Other Topics Concern   Not on file  Social History Narrative   Lives alone in a two story home.  Divorced.  Has 7 children.     Retired from [employer].     Education: some college.   Right Handed   Social Determinants of Health   Financial Resource Strain: Not on file  Food Insecurity: Not on file  Transportation Needs: Not on file  Physical Activity: Not on file  Stress: Not on file  Social Connections: Not on file     Family History: The patient's family history includes Healthy in his daughter, sister, and son; Heart attack in his father; Other in his mother.  ROS:   Please see the history of present illness.     All other systems reviewed and are negative.  EKGs/Labs/Other Studies Reviewed:    The following studies were reviewed today:   EKG:  EKG is  ordered today.  The ekg ordered today  demonstrates  NSR, RBBB, LAFB  Recent Labs: 02/24/2021: TSH 1.150 10/11/2021: BUN 30; Creatinine, Ser 1.06; Hemoglobin WILL FOLLOW; Platelets WILL FOLLOW; Potassium 4.9; Sodium 136  Recent Lipid Panel    Component Value Date/Time   CHOL 128 06/05/2019 0820   TRIG 109 06/05/2019 0820   HDL 41 06/05/2019 0820   CHOLHDL 3.1 06/05/2019 0820   CHOLHDL 2.8 08/21/2018 0924   VLDL 11 08/21/2018 0924   LDLCALC 67 06/05/2019 0820     Risk Assessment/Calculations:           Physical Exam:    VS:    Vitals:   10/11/21 1009  BP: (!) 146/72  Pulse: (!) 51  SpO2: 100%     Wt Readings from Last 3 Encounters:  10/11/21 164 lb (74.4 kg)  10/05/21 163 lb (73.9 kg)  09/29/21 163 lb (73.9 kg)     GEN:  Well nourished, well developed in no acute distress HEENT: Normal NECK: No JVD LYMPHATICS: No lymphadenopathy CARDIAC: RRR, no murmurs, rubs, gallops RESPIRATORY:  Clear to auscultation without rales, wheezing or rhonchi  ABDOMEN:non-distended MUSCULOSKELETAL:  No edema;  left thumb amputation SKIN: Warm and dry NEUROLOGIC:  Alert and oriented x 3 PSYCHIATRIC:  Normal affect   ASSESSMENT:    #Ischemic Heart disease. Last cath 2019 showed 95% RCA s/p PCI. Mid LAD CTO was attempted with plan  to consider revascularization electively. With inducible ischemia and symptoms, opt to have him return for [facility] with consideration for CTO intervention.  - Full Code - Right radial - crt 1.06 mg/dL -Hold xarelto , last dose 08/09/2022 -on PRN lasix - not on BB or ACE I due to intolerance  - not on antiplatelet with AC  #Atrial Fibrillation: on xarelto. In sinus rhythm  #HTN- continue norvasc 5 mg daily  The patient understands that risks included but are not limited to stroke (1 in 1000), death (1 in 1000), kidney failure [usually temporary] (1 in 500), bleeding (1 in 200), allergic reaction [possibly serious] (1 in 200).     PLAN:    In order of problems listed above:  LHC  scheduled Follow up with primary cardiology team 11/03/2021      Shared Decision Making/Informed Consent The risks [stroke (1 in 1000), death (1 in 1000), kidney failure [usually temporary] (1 in 500), bleeding (1 in 200), allergic reaction [possibly serious] (1 in 200)], benefits (diagnostic support and management of coronary artery disease) and alternatives of a cardiac catheterization were discussed  in detail with Marcus [name] and he is willing to proceed.    Medication Adjustments/Labs and Tests Ordered: Current medicines are reviewed at length with the patient today.  Concerns regarding medicines are outlined above.  Orders Placed This Encounter  Procedures   Basic metabolic panel   CBC   EKG 12-Lead   No orders of the defined types were placed in this encounter.   Patient Instructions  Medication Instructions:  PLEASE HOLD [place] AFTER HEART CATH. DO NOT TAKE ANY XARELTO TONIGHT 1/11 or Thursday 1/12.  *If you need a refill on your cardiac medications before your next appointment, please call your pharmacy*  Lab Work: [place]  If you have labs (blood work) drawn today and your tests are completely normal, you will receive your results only by: [place] (if you have MyChart) OR A paper copy in the mail If you have any lab test that is abnormal or we need to change your treatment, we will call you to review the results.  Testing/Procedures: Your physician has requested that you have a cardiac catheterization. Cardiac catheterization is used to diagnose and/or treat various heart conditions. Doctors may recommend this procedure for a number of different reasons. The most common reason is to evaluate chest pain. Chest pain can be a symptom of coronary artery disease (CAD), and cardiac catheterization can show whether plaque is narrowing or blocking your hearts arteries. This procedure is also used to evaluate the valves, as well as measure the blood flow and oxygen  levels in different parts of your heart. For further information please visit HugeFiesta.tn. Please follow instruction sheet, as given.  Follow-Up: At [facility], you and your health needs are our priority.  As part of our continuing mission to provide you with exceptional heart care, we have created designated Provider Care Teams.  These Care Teams include your primary Cardiologist (physician) and Advanced Practice Providers (APPs -  Physician Assistants and Nurse Practitioners) who all work together to provide you with the care you need, when you need it.  Your next appointment:   2 week(s)  The format for your next appointment:   In Person  Provider: ANY APP  Other Instructions  [address] [address] Dept: [phone] Loc: [place].  10/11/2021  You are scheduled for a Cardiac Catheterization on Friday, January 13 with Dr. Lauree Chandler.  1. Please arrive at the [facility] (Main Entrance A) at [facility]: [address] [place], [place] [address] at 8:30 AM (This time is two hours before your procedure to ensure your preparation). Free valet parking service is available.   Special note: Every effort is made to have your procedure done on time. Please understand that emergencies sometimes delay scheduled procedures.  2. Diet: Do not eat solid foods after midnight.  The patient may have clear liquids until 5am upon the day of the procedure.  3. Labs: You will need to have blood drawn on Wednesday, January 11 at [place]  Open: [place] (Lunch 12:30 - 1:30)   Phone: [phone]. You do not need to be fasting.  4. Medication instructions in preparation for your procedure:   Contrast Allergy: No  Stop taking Xarelto (Rivaroxaban) on Wednesday, January 11.  On the morning of  your procedure, take your Aspirin and any morning medicines NOT listed above.  You  may use sips of water.  5. Plan for one night stay--bring personal belongings. 6. Bring a current list of your medications and current insurance cards. 7. You MUST have a responsible person to drive you home. 8. Someone MUST be with you the first 24 hours after you arrive home or your discharge will be delayed. 9. Please wear clothes that are easy to get on and off and wear slip-on shoes.  Thank you for allowing [place] to care for you!   -- [place] [facility] services    Signed, Janina Mayo, MD  10/11/2021 6:04 PM    [place]

## 2021-10-11 NOTE — Telephone Encounter (Addendum)
Called patient regarding results of echocardio gram.----- Message from Lendon Colonel, NP sent at 10/11/2021  4:19 PM EST ----- Reviewed his echo. Shows normal pumping function, with relaxation stiffening. He has Mitral Valve regurgitation and moderate to severe Tricuspid regurgitation, moderate aortic valve regurgitation. He is being scheduled for a cath after an abnormal stress test. Saw Dr. Harl Bowie today.   KL

## 2021-10-11 NOTE — Patient Instructions (Signed)
Medication Instructions:  PLEASE HOLD [place] AFTER HEART CATH. DO NOT TAKE ANY XARELTO TONIGHT 1/11 or Thursday 1/12.  *If you need a refill on your cardiac medications before your next appointment, please call your pharmacy*  Lab Work: [place]  If you have labs (blood work) drawn today and your tests are completely normal, you will receive your results only by: [place] (if you have MyChart) OR A paper copy in the mail If you have any lab test that is abnormal or we need to change your treatment, we will call you to review the results.  Testing/Procedures: Your physician has requested that you have a cardiac catheterization. Cardiac catheterization is used to diagnose and/or treat various heart conditions. Doctors may recommend this procedure for a number of different reasons. The most common reason is to evaluate chest pain. Chest pain can be a symptom of coronary artery disease (CAD), and cardiac catheterization can show whether plaque is narrowing or blocking your hearts arteries. This procedure is also used to evaluate the valves, as well as measure the blood flow and oxygen levels in different parts of your heart. For further information please visit HugeFiesta.tn. Please follow instruction sheet, as given.  Follow-Up: At [facility], you and your health needs are our priority.  As part of our continuing mission to provide you with exceptional heart care, we have created designated Provider Care Teams.  These Care Teams include your primary Cardiologist (physician) and Advanced Practice Providers (APPs -  Physician Assistants and Nurse Practitioners) who all work together to provide you with the care you need, when you need it.  Your next appointment:   2 week(s)  The format for your next appointment:   In Person  Provider: ANY APP  Other Instructions  [place] [address] [address] Dept: [phone] Loc: [place].  10/11/2021  You are scheduled for a Cardiac Catheterization on Friday, January 13 with Dr. Lauree Chandler.  1. Please arrive at the [facility] (Main Entrance A) at [facility]: [address] [place], [place] [address] at 8:30 AM (This time is two hours before your procedure to ensure your preparation). Free valet parking service is available.   Special note: Every effort is made to have your procedure done on time. Please understand that emergencies sometimes delay scheduled procedures.  2. Diet: Do not eat solid foods after midnight.  The patient may have clear liquids until 5am upon the day of the procedure.  3. Labs: You will need to have blood drawn on Wednesday, January 11 at [place]  Open: [place] (Lunch 12:30 - 1:30)   Phone: [phone]. You do not need to be fasting.  4. Medication instructions in preparation for your procedure:   Contrast Allergy: No  Stop taking Xarelto (Rivaroxaban) on Wednesday, January 11.  On the morning of your procedure, take your Aspirin and any morning medicines NOT listed above.  You may use sips of water.  5. Plan for one night stay--bring personal belongings. 6. Bring a current list of your medications and current insurance cards. 7. You MUST have a responsible person to drive you home. 8. Someone MUST be with you the first 24 hours after you arrive home or your discharge will be delayed. 9. Please wear clothes that are easy to get on and off and wear slip-on shoes.  Thank you for allowing [place] to care for you!   -- [place] [facility] services

## 2021-10-12 ENCOUNTER — Telehealth: Payer: Self-pay | Admitting: *Deleted

## 2021-10-12 NOTE — Telephone Encounter (Addendum)
Cardiac catheterization scheduled at [facility] for: Friday October 13, 2021 10:30 AM [facility] Main Entrance A [facility]) at: 8:30 AM   Diet-no solid food after midnight prior to cath, clear liquids until 5 AM day of procedure.  Medication instructions for procedure: -Hold:  Xarelto-none 10/11/21 until post procedure  Lasix-AM of procedure -Except hold medications usual morning medications can be taken pre-cath with sips of water including aspirin 81 mg.    Confirmed patient has responsible adult to drive home post procedure and be with patient first 24 hours after arriving home.  [facility] does allow one visitor to accompany you and wait in the hospital waiting room while you are there for your procedure. You and your visitor will be asked to wear a mask once you enter the hospital.   Patient reports does not currently have any new symptoms concerning for COVID-19 and no household members with COVID-19 like illness.    Reviewed procedure/mask/visitor instructions with patient.

## 2021-10-13 ENCOUNTER — Encounter (HOSPITAL_COMMUNITY)
Admission: RE | Disposition: A | Discharge status: Home / Self Care | Payer: Self-pay | Source: Home / Self Care | Attending: Cardiovascular Disease

## 2021-10-13 ENCOUNTER — Ambulatory Visit (HOSPITAL_COMMUNITY)
Admission: RE | Admit: 2021-10-13 | Discharge: 2021-10-14 | Disposition: A | Discharge status: Home / Self Care | Payer: Medicare Other | Attending: Cardiovascular Disease | Admitting: Cardiovascular Disease

## 2021-10-13 ENCOUNTER — Other Ambulatory Visit: Payer: Self-pay

## 2021-10-13 DIAGNOSIS — I2582 Chronic total occlusion of coronary artery: Secondary | ICD-10-CM | POA: Diagnosis not present

## 2021-10-13 DIAGNOSIS — Z7901 Long term (current) use of anticoagulants: Secondary | ICD-10-CM | POA: Insufficient documentation

## 2021-10-13 DIAGNOSIS — I4892 Unspecified atrial flutter: Secondary | ICD-10-CM | POA: Diagnosis not present

## 2021-10-13 DIAGNOSIS — I5042 Chronic combined systolic (congestive) and diastolic (congestive) heart failure: Secondary | ICD-10-CM | POA: Diagnosis not present

## 2021-10-13 DIAGNOSIS — I4819 Other persistent atrial fibrillation: Secondary | ICD-10-CM | POA: Diagnosis present

## 2021-10-13 DIAGNOSIS — Z20822 Contact with and (suspected) exposure to covid-19: Secondary | ICD-10-CM | POA: Diagnosis not present

## 2021-10-13 DIAGNOSIS — N182 Chronic kidney disease, stage 2 (mild): Secondary | ICD-10-CM | POA: Diagnosis not present

## 2021-10-13 DIAGNOSIS — I25110 Atherosclerotic heart disease of native coronary artery with unstable angina pectoris: Secondary | ICD-10-CM | POA: Diagnosis not present

## 2021-10-13 DIAGNOSIS — I252 Old myocardial infarction: Secondary | ICD-10-CM | POA: Diagnosis not present

## 2021-10-13 DIAGNOSIS — I130 Hypertensive heart and chronic kidney disease with heart failure and stage 1 through stage 4 chronic kidney disease, or unspecified chronic kidney disease: Secondary | ICD-10-CM | POA: Diagnosis not present

## 2021-10-13 DIAGNOSIS — I10 Essential (primary) hypertension: Secondary | ICD-10-CM | POA: Diagnosis present

## 2021-10-13 DIAGNOSIS — Z0184 Encounter for antibody response examination: Secondary | ICD-10-CM | POA: Diagnosis not present

## 2021-10-13 DIAGNOSIS — I200 Unstable angina: Secondary | ICD-10-CM

## 2021-10-13 DIAGNOSIS — E7800 Pure hypercholesterolemia, unspecified: Secondary | ICD-10-CM | POA: Diagnosis not present

## 2021-10-13 DIAGNOSIS — G7000 Myasthenia gravis without (acute) exacerbation: Secondary | ICD-10-CM | POA: Diagnosis not present

## 2021-10-13 DIAGNOSIS — D696 Thrombocytopenia, unspecified: Secondary | ICD-10-CM | POA: Diagnosis present

## 2021-10-13 DIAGNOSIS — D649 Anemia, unspecified: Secondary | ICD-10-CM | POA: Diagnosis present

## 2021-10-13 DIAGNOSIS — Z955 Presence of coronary angioplasty implant and graft: Secondary | ICD-10-CM

## 2021-10-13 DIAGNOSIS — I2510 Atherosclerotic heart disease of native coronary artery without angina pectoris: Secondary | ICD-10-CM | POA: Diagnosis present

## 2021-10-13 DIAGNOSIS — I272 Pulmonary hypertension, unspecified: Secondary | ICD-10-CM | POA: Diagnosis not present

## 2021-10-13 DIAGNOSIS — R001 Bradycardia, unspecified: Secondary | ICD-10-CM

## 2021-10-13 HISTORY — DX: Unspecified atrial flutter: I48.92

## 2021-10-13 HISTORY — DX: Thrombocytopenia, unspecified: D69.6

## 2021-10-13 HISTORY — PX: LEFT HEART CATH AND CORONARY ANGIOGRAPHY: CATH118249

## 2021-10-13 HISTORY — PX: CORONARY STENT INTERVENTION: CATH118234

## 2021-10-13 HISTORY — DX: Anemia, unspecified: D64.9

## 2021-10-13 HISTORY — DX: Bradycardia, unspecified: R00.1

## 2021-10-13 HISTORY — DX: Unspecified right bundle-branch block: I45.10

## 2021-10-13 LAB — SAR COV2 SEROLOGY (COVID19)AB(IGG),IA: SARS-CoV-2 Ab, IgG: NONREACTIVE

## 2021-10-13 LAB — POCT ACTIVATED CLOTTING TIME: Activated Clotting Time: 269 seconds

## 2021-10-13 SURGERY — LEFT HEART CATH AND CORONARY ANGIOGRAPHY
Anesthesia: LOCAL

## 2021-10-13 MED ORDER — SODIUM CHLORIDE 0.9 % IV SOLN: 250.0000 mL | INTRAVENOUS | Status: DC | PRN

## 2021-10-13 MED ORDER — SODIUM CHLORIDE 0.9% FLUSH: 3.0000 mL | INTRAVENOUS | Status: DC | PRN

## 2021-10-13 MED ORDER — HYDRALAZINE HCL 20 MG/ML IJ SOLN
10.0000 mg | INTRAMUSCULAR | Status: AC | PRN
  Administered 2021-10-13: 10 mg via INTRAVENOUS

## 2021-10-13 MED ORDER — CLOPIDOGREL BISULFATE 75 MG PO TABS
75.0000 mg | ORAL_TABLET | Freq: Every day | ORAL | Status: DC
  Administered 2021-10-14: 75 mg via ORAL
  Filled 2021-10-13: qty 1

## 2021-10-13 MED ORDER — ASPIRIN 81 MG PO CHEW
81.0000 mg | CHEWABLE_TABLET | Freq: Every day | ORAL | Status: DC
  Administered 2021-10-14: 81 mg via ORAL
  Filled 2021-10-13: qty 1

## 2021-10-13 MED ORDER — HEPARIN SODIUM (PORCINE) 1000 UNIT/ML IJ SOLN
INTRAMUSCULAR | Status: AC
  Filled 2021-10-13: qty 10

## 2021-10-13 MED ORDER — SODIUM CHLORIDE 0.9 % IV SOLN: INTRAVENOUS | Status: AC

## 2021-10-13 MED ORDER — MIDAZOLAM HCL 2 MG/2ML IJ SOLN
INTRAMUSCULAR | Status: DC | PRN
  Administered 2021-10-13: 1 mg via INTRAVENOUS

## 2021-10-13 MED ORDER — MIDAZOLAM HCL 2 MG/2ML IJ SOLN
INTRAMUSCULAR | Status: AC
  Filled 2021-10-13: qty 2

## 2021-10-13 MED ORDER — FENTANYL CITRATE (PF) 100 MCG/2ML IJ SOLN
INTRAMUSCULAR | Status: DC | PRN
  Administered 2021-10-13: 25 ug via INTRAVENOUS

## 2021-10-13 MED ORDER — LIDOCAINE HCL (PF) 1 % IJ SOLN
INTRAMUSCULAR | Status: AC
  Filled 2021-10-13: qty 30

## 2021-10-13 MED ORDER — ONDANSETRON HCL 4 MG/2ML IJ SOLN: 4.0000 mg | Freq: Four times a day (QID) | INTRAMUSCULAR | Status: DC | PRN

## 2021-10-13 MED ORDER — FAMOTIDINE IN NACL 20-0.9 MG/50ML-% IV SOLN
INTRAVENOUS | Status: AC
  Filled 2021-10-13: qty 50

## 2021-10-13 MED ORDER — SODIUM CHLORIDE 0.9% FLUSH
3.0000 mL | Freq: Two times a day (BID) | INTRAVENOUS | Status: DC
  Administered 2021-10-13 – 2021-10-14 (×2): 3 mL via INTRAVENOUS

## 2021-10-13 MED ORDER — LIDOCAINE HCL (PF) 1 % IJ SOLN
INTRAMUSCULAR | Status: DC | PRN
  Administered 2021-10-13: 2 mL

## 2021-10-13 MED ORDER — SODIUM CHLORIDE 0.9% FLUSH
3.0000 mL | Freq: Two times a day (BID) | INTRAVENOUS | Status: DC
  Administered 2021-10-13 – 2021-10-14 (×3): 3 mL via INTRAVENOUS

## 2021-10-13 MED ORDER — FENTANYL CITRATE (PF) 100 MCG/2ML IJ SOLN
INTRAMUSCULAR | Status: AC
  Filled 2021-10-13: qty 2

## 2021-10-13 MED ORDER — ACETAMINOPHEN 325 MG PO TABS: 650.0000 mg | ORAL_TABLET | ORAL | Status: DC | PRN

## 2021-10-13 MED ORDER — VERAPAMIL HCL 2.5 MG/ML IV SOLN
INTRAVENOUS | Status: AC
  Filled 2021-10-13: qty 2

## 2021-10-13 MED ORDER — SODIUM CHLORIDE 0.9 % WEIGHT BASED INFUSION
3.0000 mL/kg/h | INTRAVENOUS | Status: DC
  Administered 2021-10-13: 3 mL/kg/h via INTRAVENOUS

## 2021-10-13 MED ORDER — IOHEXOL 350 MG/ML SOLN
INTRAVENOUS | Status: DC | PRN
  Administered 2021-10-13: 100 mL

## 2021-10-13 MED ORDER — CLOPIDOGREL BISULFATE 300 MG PO TABS
ORAL_TABLET | ORAL | Status: AC
  Filled 2021-10-13: qty 2

## 2021-10-13 MED ORDER — AMLODIPINE BESYLATE 5 MG PO TABS
10.0000 mg | ORAL_TABLET | Freq: Every day | ORAL | Status: DC
  Administered 2021-10-14: 10 mg via ORAL
  Filled 2021-10-13: qty 2

## 2021-10-13 MED ORDER — HEPARIN SODIUM (PORCINE) 1000 UNIT/ML IJ SOLN
INTRAMUSCULAR | Status: DC | PRN
  Administered 2021-10-13: 2000 [IU] via INTRAVENOUS
  Administered 2021-10-13: 5000 [IU] via INTRAVENOUS
  Administered 2021-10-13: 4000 [IU] via INTRAVENOUS

## 2021-10-13 MED ORDER — FAMOTIDINE IN NACL 20-0.9 MG/50ML-% IV SOLN
INTRAVENOUS | Status: AC | PRN
  Administered 2021-10-13: 20 mg via INTRAVENOUS

## 2021-10-13 MED ORDER — HYDRALAZINE HCL 20 MG/ML IJ SOLN
INTRAMUSCULAR | Status: AC
  Filled 2021-10-13: qty 1

## 2021-10-13 MED ORDER — SODIUM CHLORIDE 0.9 % WEIGHT BASED INFUSION: 1.0000 mL/kg/h | INTRAVENOUS | Status: DC

## 2021-10-13 MED ORDER — HYDRALAZINE HCL 20 MG/ML IJ SOLN
INTRAMUSCULAR | Status: DC | PRN
  Administered 2021-10-13: 10 mg via INTRAVENOUS

## 2021-10-13 MED ORDER — VERAPAMIL HCL 2.5 MG/ML IV SOLN
INTRAVENOUS | Status: DC | PRN
  Administered 2021-10-13: 10 mL via INTRA_ARTERIAL

## 2021-10-13 MED ORDER — ASPIRIN 81 MG PO CHEW
81.0000 mg | CHEWABLE_TABLET | ORAL | Status: AC
  Administered 2021-10-13: 81 mg via ORAL
  Filled 2021-10-13: qty 1

## 2021-10-13 MED ORDER — LEVOTHYROXINE SODIUM 137 MCG PO TABS
137.0000 ug | ORAL_TABLET | Freq: Every day | ORAL | Status: DC
  Administered 2021-10-14: 137 ug via ORAL
  Filled 2021-10-13: qty 1

## 2021-10-13 MED ORDER — CLOPIDOGREL BISULFATE 300 MG PO TABS
ORAL_TABLET | ORAL | Status: DC | PRN
  Administered 2021-10-13: 600 mg via ORAL

## 2021-10-13 MED ORDER — HEPARIN (PORCINE) IN NACL 1000-0.9 UT/500ML-% IV SOLN
INTRAVENOUS | Status: DC | PRN
  Administered 2021-10-13 (×2): 500 mL

## 2021-10-13 MED ORDER — NITROGLYCERIN 1 MG/10 ML FOR IR/CATH LAB
INTRA_ARTERIAL | Status: AC
  Filled 2021-10-13: qty 10

## 2021-10-13 MED ORDER — HEPARIN (PORCINE) IN NACL 1000-0.9 UT/500ML-% IV SOLN
INTRAVENOUS | Status: AC
  Filled 2021-10-13: qty 1000

## 2021-10-13 SURGICAL SUPPLY — 18 items
BALLN SAPPHIRE 2.0X12 (BALLOONS) ×4
BALLN SAPPHIRE ~~LOC~~ 3.25X12 (BALLOONS) ×1 IMPLANT
BALLOON SAPPHIRE 2.0X12 (BALLOONS) IMPLANT
CATH 5FR JL3.5 JR4 ANG PIG MP (CATHETERS) ×1 IMPLANT
CATH LAUNCHER 6FR JR4 (CATHETERS) ×1 IMPLANT
DEVICE RAD COMP TR BAND LRG (VASCULAR PRODUCTS) ×1 IMPLANT
GLIDESHEATH SLEND SS 6F .021 (SHEATH) ×1 IMPLANT
GUIDEWIRE INQWIRE 1.5J.035X260 (WIRE) IMPLANT
INQWIRE 1.5J .035X260CM (WIRE) ×2
KIT ENCORE 26 ADVANTAGE (KITS) ×1 IMPLANT
KIT HEART LEFT (KITS) ×2 IMPLANT
PACK CARDIAC CATHETERIZATION (CUSTOM PROCEDURE TRAY) ×2 IMPLANT
SHEATH PROBE COVER 6X72 (BAG) ×1 IMPLANT
STENT SYNERGY XD 3.0X16 (Permanent Stent) IMPLANT
SYNERGY XD 3.0X16 (Permanent Stent) ×2 IMPLANT
TRANSDUCER W/STOPCOCK (MISCELLANEOUS) ×2 IMPLANT
TUBING CIL FLEX 10 FLL-RA (TUBING) ×2 IMPLANT
WIRE COUGAR XT STRL 190CM (WIRE) ×1 IMPLANT

## 2021-10-13 NOTE — Interval H&P Note (Signed)
History and Physical Interval Note:  10/13/2021 8:55 AM  Bolton A Helser Sr.  has presented today for surgery, with the diagnosis of abnormal stress test.  The various methods of treatment have been discussed with the patient and family. After consideration of risks, benefits and other options for treatment, the patient has consented to  Procedure(s): LEFT HEART CATH AND CORONARY ANGIOGRAPHY (N/A) as a surgical intervention.  The patient's history has been reviewed, patient examined, no change in status, stable for surgery.  I have reviewed the patient's chart and labs.  Questions were answered to the patient's satisfaction.    Cath Lab Visit (complete for each Cath Lab visit)  Clinical Evaluation Leading to the Procedure:   ACS: No.  Non-ACS:    Anginal Classification: CCS III  Anti-ischemic medical therapy: Minimal Therapy (1 class of medications)  Non-Invasive Test Results: No non-invasive testing performed  Prior CABG: No previous CABG        Lauree Chandler

## 2021-10-13 NOTE — Progress Notes (Signed)
TR BAND REMOVAL  LOCATION:    right radial  DEFLATED PER PROTOCOL:    Yes.    TIME BAND OFF / DRESSING APPLIED:    1545pm A clean dry dressing applied with gauze, tegaderm and coban   SITE UPON ARRIVAL:    Level 0  SITE AFTER BAND REMOVAL:    Level 0  CIRCULATION SENSATION AND MOVEMENT:    Within Normal Limits   Yes.    COMMENTS:   Care instruction given to patient

## 2021-10-14 ENCOUNTER — Encounter (HOSPITAL_COMMUNITY): Payer: Self-pay | Admitting: Cardiovascular Disease

## 2021-10-14 ENCOUNTER — Other Ambulatory Visit: Payer: Self-pay

## 2021-10-14 DIAGNOSIS — Z0184 Encounter for antibody response examination: Secondary | ICD-10-CM | POA: Diagnosis not present

## 2021-10-14 DIAGNOSIS — I200 Unstable angina: Secondary | ICD-10-CM | POA: Diagnosis not present

## 2021-10-14 DIAGNOSIS — I130 Hypertensive heart and chronic kidney disease with heart failure and stage 1 through stage 4 chronic kidney disease, or unspecified chronic kidney disease: Secondary | ICD-10-CM | POA: Diagnosis not present

## 2021-10-14 DIAGNOSIS — Z955 Presence of coronary angioplasty implant and graft: Secondary | ICD-10-CM | POA: Diagnosis not present

## 2021-10-14 DIAGNOSIS — I25110 Atherosclerotic heart disease of native coronary artery with unstable angina pectoris: Secondary | ICD-10-CM | POA: Diagnosis not present

## 2021-10-14 DIAGNOSIS — G7000 Myasthenia gravis without (acute) exacerbation: Secondary | ICD-10-CM | POA: Diagnosis not present

## 2021-10-14 DIAGNOSIS — I272 Pulmonary hypertension, unspecified: Secondary | ICD-10-CM

## 2021-10-14 DIAGNOSIS — N182 Chronic kidney disease, stage 2 (mild): Secondary | ICD-10-CM | POA: Diagnosis not present

## 2021-10-14 DIAGNOSIS — Z20822 Contact with and (suspected) exposure to covid-19: Secondary | ICD-10-CM | POA: Diagnosis not present

## 2021-10-14 DIAGNOSIS — I252 Old myocardial infarction: Secondary | ICD-10-CM | POA: Diagnosis not present

## 2021-10-14 DIAGNOSIS — R001 Bradycardia, unspecified: Secondary | ICD-10-CM

## 2021-10-14 DIAGNOSIS — Z7901 Long term (current) use of anticoagulants: Secondary | ICD-10-CM | POA: Diagnosis not present

## 2021-10-14 DIAGNOSIS — I5042 Chronic combined systolic (congestive) and diastolic (congestive) heart failure: Secondary | ICD-10-CM | POA: Diagnosis not present

## 2021-10-14 DIAGNOSIS — I2582 Chronic total occlusion of coronary artery: Secondary | ICD-10-CM | POA: Diagnosis not present

## 2021-10-14 LAB — BASIC METABOLIC PANEL
Anion gap: 7 (ref 5–15)
BUN: 25 mg/dL — ABNORMAL HIGH (ref 8–23)
CO2: 21 mmol/L — ABNORMAL LOW (ref 22–32)
Calcium: 8.5 mg/dL — ABNORMAL LOW (ref 8.9–10.3)
Chloride: 110 mmol/L (ref 98–111)
Creatinine, Ser: 1.07 mg/dL (ref 0.61–1.24)
GFR, Estimated: >60 mL/min (ref >60)
Glucose, Bld: 86 mg/dL (ref 70–99)
Potassium: 4.5 mmol/L (ref 3.5–5.1)
Sodium: 138 mmol/L (ref 135–145)

## 2021-10-14 LAB — CBC
HCT: 32.6 % — ABNORMAL LOW (ref 39.0–52.0)
Hemoglobin: 10.6 g/dL — ABNORMAL LOW (ref 13.0–17.0)
MCH: 31.6 pg (ref 26.0–34.0)
MCHC: 32.5 g/dL (ref 30.0–36.0)
MCV: 97.3 fL (ref 80.0–100.0)
Platelets: 91 10*3/uL — ABNORMAL LOW (ref 150–400)
RBC: 3.35 MIL/uL — ABNORMAL LOW (ref 4.22–5.81)
RDW: 14.1 % (ref 11.5–15.5)
WBC: 4.9 10*3/uL (ref 4.0–10.5)
nRBC: 0 % (ref 0.0–0.2)

## 2021-10-14 LAB — SARS CORONAVIRUS 2 (TAT 6-24 HRS): SARS Coronavirus 2: NEGATIVE

## 2021-10-14 LAB — TSH: TSH: 3.742 u[IU]/mL (ref 0.350–4.500)

## 2021-10-14 MED ORDER — FUROSEMIDE 20 MG PO TABS: 20.0000 mg | ORAL_TABLET | Freq: Every day | ORAL | 1 refills | Status: DC | PRN

## 2021-10-14 MED ORDER — CLOPIDOGREL BISULFATE 75 MG PO TABS: 75.0000 mg | ORAL_TABLET | Freq: Every day | ORAL | 1 refills | Status: DC

## 2021-10-14 MED ORDER — AMLODIPINE BESYLATE 10 MG PO TABS: 10.0000 mg | ORAL_TABLET | Freq: Every day | ORAL | 1 refills | Status: DC

## 2021-10-14 NOTE — Plan of Care (Signed)
Problem: Education: °Goal: Knowledge of General Education information will improve °Description: Including pain rating scale, medication(s)/side effects and non-pharmacologic comfort measures °Outcome: Completed/Met °  °

## 2021-10-14 NOTE — Progress Notes (Signed)
Nursing DC note  Patient alert and oriented. Ex wife also at the bedside. Patient requested for her to also listen to the instructions. Both verbalized understanding of dc instructions. All belongings given to patient. Patient denies cp or sob. No bleeding noted from iv sites.

## 2021-10-14 NOTE — Discharge Instructions (Signed)
You have an appointment set up with the [facility].  Multiple studies have shown that being followed by a dedicated atrial fibrillation clinic in addition to the standard care you receive from your other physicians improves health. We believe that enrollment in the atrial fibrillation clinic will allow [place] to better care for you.   The phone number to the [facility] is [phone]. The clinic is staffed Monday through Friday from 8:30am to 5pm.  Parking Directions: The clinic is located in the Heart and Vascular Building connected to [facility]. 1)From [address] turn on to [address] and go to the 3rd entrance  (Heart and Vascular entrance) on the right. 2)Look to the right for Heart &Vascular Parking Garage. 3)A code for the entrance is required, please call the clinic during business hours to receive this.   4)Take the elevators to the 1st floor. Registration is in the room with the glass walls at the end of the hallway.  If you have any trouble parking or locating the clinic, please dont hesitate to call [phone].

## 2021-10-14 NOTE — Progress Notes (Signed)
CARDIAC REHAB PHASE I   PRE:  Rate/Rhythm: SR /72  BP:  Sitting: 183/85  Standing:       SaO2: 98  MODE:  Ambulation:    POST:  Rate/Rhythm:  BP:  Sitting:       SaO2  Received pt at bedside. Assisted to bathroom. Provided education S/P PCI stent. Reviewed self care, activity restrictions, wound care and s/s to report to MD. Stressed Medication compliance, especially Plavix. Reviewed exercise recommendations S/S to terminate activity and use of NTG. Provided information on low Na diet. Pt is following P[place] diet (vegetarian) and voices having lost 50 lbs. Encouraged to attend the [place] program and referral was made to [facility].  Attempted to ambulate patient but blood pressure is elevated. Primary RN Tiffany notified and walk held at this time until blood pressure is more controlled.   Lesly Rubenstein, [place], ACSM EP-C, [facility] 10/14/2021 0825 [phone]

## 2021-10-14 NOTE — Discharge Summary (Addendum)
Discharge Summary    Patient ID: Marcus Kaufmann Sr. MRN: [id number]; DOB: Apr 04, 1932  Admit date: 10/13/2021 Discharge date: 10/14/2021  PCP:  [name], MD   [facility] Cardiologist:  Peter Martinique, MD        Discharge Diagnoses    Principal Problem:   Unstable angina [facility]) Active Problems:   Myasthenia gravis ([place])   Thrombocytopenia ([place])   Hypertension   Hypercholesterolemia   CAD in native artery   Chronic combined systolic and diastolic heart failure (HCC)   Persistent atrial fibrillation (HCC)   Atrial flutter ([place])   Anemia   Status post coronary artery stent placement   Bradycardia   Pulmonary hypertension, unspecified ([place])   Diagnostic Studies/Procedures    Cath 10/13/21   Prox RCA-1 lesion is 99% stenosed.   Ost Cx to Mid Cx lesion is 60% stenosed.   Mid LAD lesion is 100% stenosed.   Previously placed Prox RCA-2 stent (unknown type) is  widely patent.   A drug-eluting stent was successfully placed using a SYNERGY XD 3.0X16.   Post intervention, there is a 0% residual stenosis.   Large dominant RCA with severe proximal stenosis. The proximal stent is patent.  Successful PTCA/DES x 1 proximal RCA Moderate disease in the proximal and mid Circumflex Functional chronic total occlusion of the calcified mid LAD, unchanged from last cath in 2019. The distal LAD fills faintly from antegrade flow and from right to left collaterals.    Recommendations: Will continue ASA and Plavix tomorrow. If his Eliquis is restarted tomorrow, I think the ASA can be stopped and he can be continued on Plavix along with Eliquis. Monitor overnight given elevated BP before the procedure and bleeding risk.    2D echo 10/11/21  1. Left ventricular ejection fraction, by estimation, is 55%. The left  ventricle has normal function. The left ventricle demonstrates regional wall motion abnormalities with severe apical and apical septal hypokinesis. There is mild left  ventricular  hypertrophy. Left ventricular diastolic parameters are consistent with Grade II diastolic dysfunction (pseudonormalization). The average left ventricular global longitudinal strain is -16.8 %. The global longitudinal strain is abnormal.   2. Right ventricular systolic function is normal. The right ventricular size is mildly enlarged. There is severely elevated pulmonary artery systolic pressure. The estimated right ventricular systolic pressure is 60.7 mmHg.   3. Left atrial size was moderately dilated.   4. Right atrial size was severely dilated.   5. The mitral valve is abnormal. Mild to moderate mitral valve  regurgitation. No evidence of mitral stenosis. Moderate mitral annular calcification.   6. Tricuspid valve regurgitation is moderate to severe.   7. The aortic valve is tricuspid. Aortic valve regurgitation is moderate. Mild aortic valve stenosis. Aortic valve mean gradient measures 14.0 mmHg.   8. Aortic dilatation noted. There is mild dilatation of the ascending  aorta, measuring 40 mm.   9. The inferior vena cava is dilated in size with <50% respiratory  variability, suggesting right atrial pressure of 15 mmHg.   Comparison(s): 02/13/21 EF 60-65%. PA pressure 57mmHg. Mild AS 30mmHg mean  PG, 63mmHg peak PG.    Nuclear Stress Test 10/05/21   Findings are consistent with ischemia. The study is intermediate risk.   No ST deviation was noted.   LV perfusion is abnormal. There is evidence of ischemia. There is no evidence of infarction. Defect 1: There is a medium defect with severe reduction in uptake present in the apical to mid anteroseptal location(s)  that is reversible. Viability is present. There is abnormal wall motion in the defect area. Consistent with ischemia.   Left ventricular function is abnormal. Global function is mildly reduced. There was a single regional abnormality. Nuclear stress EF: 54 %. The left ventricular ejection fraction is mildly decreased (45-54%).  End diastolic cavity size is normal.   Prior study not available for comparison.  _____________   History of Present Illness     Marcus GRANDA Sr. is a 88 y.o. 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   He was recently seen in the outpatient setting with worsening DOE, chest pain, and fatigue within the preceding few weeks. In December he was found to be in atrial flutter with baseline HR 50s (previously SB 06/2021, prior DCCV 01/2021 for atrial flutter). He was referred to the atrial fibrillation clinic for further evaluation and medication recommendations versus permanent pacemaker with appointment pending. Per Jory Sims, at his age she did not know that a permanent pacemaker would be the best option for him. He had also been having issues with elevated blood pressure but positional dizziness at times. Nuclear stress test 10/05/21 was abnormal for ischemia showing medium defect with severe reduction in uptake present in the apical to mid anteroseptal location(s) that is reversible, viability present, abnormal wall motion, EF 54%. Echo 10/11/21 showed EF 55%, regional wall motion abnormalities with severe apical and apical septal hypokinesis, grade 2 DD, normal RV function, mildly enlarged RV, severely elevated PASP, moderate LAE, mild-moderate MR, moderate-severe TR, mild AS, mild dilation of ascending aorta. Due to abnormal nuc was brought in for cardiac cath.  Hospital Course   1. CAD/USA, abnormal stress test - He  underwent cardiac cath showing large dominant RCA with severe proximal stenosis treated with DES, with moderate disease in prox and mid Cx, and functional chronic total occlusion of the calcified mid LAD, unchanged from last cath in 2019. The distal LAD filled faintly from antegrade flow and from right to left collaterals. It was recommended to treat his remaining disease medically. He was treated with ASA and Plavix overnight with recommendation to discharge on Plavix + Xarelto only given his comorbidities, no aspirin. He feels well this morning and is extremely eager for discharge. Bleeding precautions included with instructions.  2. Paroxysmal atrial fib/flutter with bradycardia - remains in atrial flutter as he was as outpatient, here with daytime waking HR predominantly 50s-60s, nocturnal rates low 40s at times, query underlying OSA. Not on AVN blocking agents due to baseline bradycardia. Will keep f/u as scheduled 10/17/21 with afib clinic. Regarding anticoagulation dose, he has been right on the border of CrCl between 48-51 recently. Given age, anemia, thrombocytopenia, we will continue Xarelto 15mg  at discharge. Recommend obtaining f/u CBC/BMET at next OV to trend.  3. Essential HTN - His blood pressure was elevated at times so amlodipine was increased from 7.5mg  to 10mg  daily. His BP is intermittently elevated this AM but he states he is anxious to leave and that his BP will not improve until we let him go home.  4. Hyperlipidemia - unable to take statins per notes, have not  considered PCSK9i previously due to age. Can revisit options as outpatient.  5. Mild anemia with thrombocytopenia - Hgb 10.6, plt 91 down from prior values. No bleeding seen. Patient has hx of mild anemia/TCP in the past. Recommend obtaining f/u CBC/BMET at next OV to trend.  6. Chronic combined CHF, severe pulmonary HTN by echo - appears euvolemic on exam today, LVEDP was 9. Continue outpatient follow-up and consider arranging  sleep study in follow-up.  7. Hypothyroidism - maintained on levothyroxine. Will try to send updated TSH day of discharge and if unable to be drawn or patient unwilling to get done, recommend obtaining at Afib clinic visit this week.  8. Mild dilation of ascending aorta by recent echo - follow as OP.  We recommended the patient stay another day to monitor BP and CBC but he is absolutely adamant that he will not stay another day and is requesting discharge. He states he will sign papers to leave if he is not discharged today. Dr. Beckie Busing has seen and examined the patient today and made recommendations for discharge regimen as outlined below. Followup has been arranged as below.   Did the patient have an acute coronary syndrome (MI, NSTEMI, STEMI, etc) this admission?:  No                               Did the patient have a percutaneous coronary intervention (stent / angioplasty)?:  Yes.     Cath/PCI Registry Performance & Quality Measures: Aspirin prescribed? - No - comorbidities, on Eliquis, anemia, thrombocytopenia ADP Receptor Inhibitor (Plavix/Clopidogrel, Brilinta/Ticagrelor or Effient/Prasugrel) prescribed (includes medically managed patients)? - Yes High Intensity Statin (Lipitor 40-80mg  or Crestor 20-40mg ) prescribed? - No - hives with statin For EF <40%, was ACEI/ARB prescribed? - Not Applicable (EF >/= 41%) For EF <40%, Aldosterone Antagonist (Spironolactone or Eplerenone) prescribed? - Not Applicable (EF >/= 32%) Cardiac Rehab Phase II ordered? - Yes   _____________  Discharge Vitals Blood pressure (!) 141/69, pulse 68, temperature 98.6 F (37 C), temperature source Oral, resp. rate 15, height 5\' 8"  (1.727 m), weight 74.4 kg, SpO2 100 %.  Filed Weights   10/13/21 0843  Weight: 74.4 kg   General: Well developed, well nourished WM, in no acute distress. Head: Normocephalic, atraumatic, sclera non-icteric, no xanthomas, nares are without discharge. Neck: Negative for carotid  bruits. JVP not elevated. Lungs: Clear bilaterally to auscultation without wheezes, rales, or rhonchi. Breathing is unlabored. Heart: Regular rate/rhythm (atrial flutter but fixed/regular), S1 S2 without murmurs, rubs, or gallops.  Abdomen: Soft, non-tender, non-distended with normoactive bowel sounds. No rebound/guarding. Extremities: No clubbing or cyanosis. No edema. Distal pedal pulses are 2+ and equal bilaterally. Right radial cath site without hematoma or ecchymosis; good pulse. Neuro: Alert and oriented X 3. Moves all extremities spontaneously. Psych:  Responds to questions appropriately with a normal affect.  EKG: atrial flutter 46bpm, RBBB, left axis deviation  Labs & Radiologic Studies    CBC Recent Labs    10/14/21 0256  WBC 4.9  HGB 10.6*  HCT 32.6*  MCV 97.3  PLT 91*   Basic Metabolic Panel Recent Labs    10/14/21 0256  NA 138  K 4.5  CL 110  CO2 21*  GLUCOSE 86  BUN 25*  CREATININE 1.07  CALCIUM 8.5*   _____________  CARDIAC CATHETERIZATION  Result Date: 10/13/2021   Prox RCA-1 lesion is 99% stenosed.   Ost Cx to Mid Cx  lesion is 60% stenosed.   Mid LAD lesion is 100% stenosed.   Previously placed Prox RCA-2 stent (unknown type) is  widely patent.   A drug-eluting stent was successfully placed using a SYNERGY XD 3.0X16.   Post intervention, there is a 0% residual stenosis. Large dominant RCA with severe proximal stenosis. The proximal stent is patent. Successful PTCA/DES x 1 proximal RCA Moderate disease in the proximal and mid Circumflex Functional chronic total occlusion of the calcified mid LAD, unchanged from last cath in 2019. The distal LAD fills faintly from antegrade flow and from right to left collaterals. Recommendations: Will continue ASA and Plavix tomorrow. If his Eliquis is restarted tomorrow, I think the ASA can be stopped and he can be continued on Plavix along with Eliquis. Monitor overnight given elevated BP before the procedure and bleeding risk.    MYOCARDIAL PERFUSION IMAGING  Result Date: 10/05/2021   Findings are consistent with ischemia. The study is intermediate risk.   No ST deviation was noted.   LV perfusion is abnormal. There is evidence of ischemia. There is no evidence of infarction. Defect 1: There is a medium defect with severe reduction in uptake present in the apical to mid anteroseptal location(s) that is reversible. Viability is present. There is abnormal wall motion in the defect area. Consistent with ischemia.   Left ventricular function is abnormal. Global function is mildly reduced. There was a single regional abnormality. Nuclear stress EF: 54 %. The left ventricular ejection fraction is mildly decreased (45-54%). End diastolic cavity size is normal.   Prior study not available for comparison.   ECHOCARDIOGRAM COMPLETE  Result Date: 10/11/2021    ECHOCARDIOGRAM REPORT   Patient Name:   Marcus Taves Ashton Sr. Date of Exam: 10/11/2021 Medical Rec #:  [id number]            Height:       68.0 in Accession #:    [id number]           Weight:       163.0 lb Date of Birth:  [birth year]            BSA:          1.874 m Patient Age:    50 years             BP:           178/80 mmHg Patient Gender: M                    HR:           66 bpm. Exam Location:  [facility] Procedure: 2D Echo, 3D Echo, Cardiac Doppler, Color Doppler and Strain Analysis Indications:    I25.1 CAD  History:        Patient has prior history of Echocardiogram examinations, most                 recent 02/13/2021.  Sonographer:    Basilia Jumbo BS, RDCS Referring Phys: [place]  1. Left ventricular ejection fraction, by estimation, is 55%. The left ventricle has normal function. The left ventricle demonstrates regional wall motion abnormalities with severe apical and apical septal hypokinesis. There is mild left ventricular hypertrophy. Left ventricular diastolic parameters are consistent with Grade II diastolic dysfunction (pseudonormalization).  The average left ventricular global longitudinal strain is -16.8 %. The global longitudinal strain is abnormal.  2. Right ventricular systolic function is normal. The right ventricular size is mildly  enlarged. There is severely elevated pulmonary artery systolic pressure. The estimated right ventricular systolic pressure is 70.9 mmHg.  3. Left atrial size was moderately dilated.  4. Right atrial size was severely dilated.  5. The mitral valve is abnormal. Mild to moderate mitral valve regurgitation. No evidence of mitral stenosis. Moderate mitral annular calcification.  6. Tricuspid valve regurgitation is moderate to severe.  7. The aortic valve is tricuspid. Aortic valve regurgitation is moderate. Mild aortic valve stenosis. Aortic valve mean gradient measures 14.0 mmHg.  8. Aortic dilatation noted. There is mild dilatation of the ascending aorta, measuring 40 mm.  9. The inferior vena cava is dilated in size with <50% respiratory variability, suggesting right atrial pressure of 15 mmHg. Comparison(s): 02/13/21 EF 60-65%. PA pressure 72mmHg. Mild AS 38mmHg mean PG, 70mmHg peak PG. FINDINGS  Left Ventricle: Left ventricular ejection fraction, by estimation, is 55%. The left ventricle has normal function. The left ventricle demonstrates regional wall motion abnormalities. The average left ventricular global longitudinal strain is -16.8 %. The global longitudinal strain is abnormal. The left ventricular internal cavity size was normal in size. There is mild left ventricular hypertrophy. Left ventricular diastolic parameters are consistent with Grade II diastolic dysfunction (pseudonormalization). Right Ventricle: The right ventricular size is mildly enlarged. No increase in right ventricular wall thickness. Right ventricular systolic function is normal. There is severely elevated pulmonary artery systolic pressure. The tricuspid regurgitant velocity is 3.47 m/s, and with an assumed right atrial pressure of 15 mmHg, the  estimated right ventricular systolic pressure is 62.8 mmHg. Left Atrium: Left atrial size was moderately dilated. Right Atrium: Right atrial size was severely dilated. Pericardium: There is no evidence of pericardial effusion. Mitral Valve: The mitral valve is abnormal. Moderate mitral annular calcification. Mild to moderate mitral valve regurgitation. No evidence of mitral valve stenosis. Tricuspid Valve: The tricuspid valve is normal in structure. Tricuspid valve regurgitation is moderate to severe. Aortic Valve: The aortic valve is tricuspid. Aortic valve regurgitation is moderate. Aortic regurgitation PHT measures 517 msec. Mild aortic stenosis is present. Aortic valve mean gradient measures 14.0 mmHg. Aortic valve peak gradient measures 25.4 mmHg. Aortic valve area, by VTI measures 3.00 cm. Pulmonic Valve: The pulmonic valve was normal in structure. Pulmonic valve regurgitation is trivial. Aorta: The aortic root is normal in size and structure and aortic dilatation noted. There is mild dilatation of the ascending aorta, measuring 40 mm. Venous: The inferior vena cava is dilated in size with less than 50% respiratory variability, suggesting right atrial pressure of 15 mmHg. IAS/Shunts: No atrial level shunt detected by color flow Doppler.  LEFT VENTRICLE PLAX 2D LVIDd:         5.00 cm   Diastology LVIDs:         3.40 cm   LV e' medial:    10.90 cm/s LV PW:         1.20 cm   LV E/e' medial:  14.6 LV IVS:        1.20 cm   LV e' lateral:   16.60 cm/s LVOT diam:     2.50 cm   LV E/e' lateral: 9.6 LV SV:         167 LV SV Index:   89        2D Longitudinal Strain LVOT Area:     4.91 cm  2D Strain GLS (A2C):   -16.9 %  2D Strain GLS (A3C):   -17.7 %                          2D Strain GLS (A4C):   -15.8 %                          2D Strain GLS Avg:     -16.8 %                           3D Volume EF:                          3D EF:        57 %                          LV EDV:       141 ml                           LV ESV:       60 ml                          LV SV:        81 ml RIGHT VENTRICLE             IVC RV Basal diam:  4.50 cm     IVC diam: 2.70 cm RV Mid diam:    4.00 cm RV S prime:     14.50 cm/s TAPSE (M-mode): 2.1 cm RVSP:           56.2 mmHg LEFT ATRIUM             Index        RIGHT ATRIUM           Index LA diam:        5.60 cm 2.99 cm/m   RA Pressure: 8.00 mmHg LA Vol (A2C):   80.3 ml 42.86 ml/m  RA Area:     32.50 cm LA Vol (A4C):   97.5 ml 52.04 ml/m  RA Volume:   116.00 ml 61.91 ml/m LA Biplane Vol: 89.4 ml 47.72 ml/m  AORTIC VALVE AV Area (Vmax):    3.25 cm AV Area (Vmean):   3.25 cm AV Area (VTI):     3.00 cm AV Vmax:           252.00 cm/s AV Vmean:          172.000 cm/s AV VTI:            0.556 m AV Peak Grad:      25.4 mmHg AV Mean Grad:      14.0 mmHg LVOT Vmax:         167.00 cm/s LVOT Vmean:        114.000 cm/s LVOT VTI:          0.340 m LVOT/AV VTI ratio: 0.61 AI PHT:            517 msec  AORTA Ao Root diam: 3.50 cm Ao Asc diam:  4.00 cm MITRAL VALVE                TRICUSPID VALVE  TR Peak grad:   48.2 mmHg MV Decel Time: 158 msec     TR Vmax:        347.00 cm/s MV E velocity: 159.00 cm/s  Estimated RAP:  8.00 mmHg MV A velocity: 45.20 cm/s   RVSP:           56.2 mmHg MV E/A ratio:  3.52                             SHUNTS                             Systemic VTI:  0.34 m                             Systemic Diam: 2.50 cm Dalton McleanMD Electronically signed by Franki Monte Signature Date/Time: 10/11/2021/2:12:30 PM    Final    Disposition   Pt is being discharged home today in good condition.  Follow-up Plans & Appointments     Follow-up Information     [facility] Follow up.   Specialty: Cardiology Why: Keep appointment as scheduled in the atrial fibrillation clinic on Tuesday Oct 17, 2021 at 1:30 PM. Contact information: [address] [id number] [place]  [place]        Lendon Colonel, NP Follow up.   Specialties: Nurse Practitioner, Radiology, Cardiology Why: [place] location - keep follow-up as scheduled with Jory Sims, NP, on Friday Nov 03, 2021 at 10:55 AM (Arrive by 10:40 AM). Contact information: [address] [place] [address] [phone]                Discharge Instructions     Amb Referral to Cardiac Rehabilitation   Complete by: As directed    Diagnosis:  Coronary Stents PTCA     After initial evaluation and assessments completed: Virtual Based Care may be provided alone or in conjunction with Phase 2 Cardiac Rehab based on patient barriers.: Yes   Diet - low sodium heart healthy   Complete by: As directed    Discharge instructions   Complete by: As directed    Your amlodipine dose was increased, new prescription sent.  You were started on a blood thinner called clopidogrel (also known as Plavix) to take in addition to your rivaroxaban (Xarelto). If you notice any bleeding such as blood in stool, black tarry stools, blood in urine, nosebleeds or any other unusual bleeding, call your doctor immediately. It is not normal to have this kind of bleeding while on a blood thinner and usually indicates there is an underlying problem with one of your body systems that needs to be checked out.   We updated your furosemide prescription to reflect that you are only taking this "as needed."   Increase activity slowly   Complete by: As directed    No driving for 2 days. Do not resume driving if you have been instructed not to drive for other reasons. No lifting over 5 lbs for 1 week. No sexual activity for 1 week. Keep procedure site clean & dry. If you notice increased pain, swelling, bleeding or pus, call/return!  You may shower, but no soaking baths/hot tubs/pools for 1 week.       Discharge Medications   Allergies as of 10/14/2021  Reactions   Ace  Inhibitors Hives   Beta Adrenergic Blockers Hives   Statins Hives        Medication List     TAKE these medications    amLODipine 10 MG tablet Commonly known as: NORVASC Take 1 tablet (10 mg total) by mouth daily. What changed:  medication strength how much to take   clopidogrel 75 MG tablet Commonly known as: PLAVIX Take 1 tablet (75 mg total) by mouth daily.   furosemide 20 MG tablet Commonly known as: LASIX Take 1 tablet (20 mg total) by mouth daily as needed for fluid. What changed: See the new instructions.   levothyroxine 137 MCG tablet Commonly known as: SYNTHROID Take 137 mcg by mouth daily before breakfast.   MAGNESIUM GLYCINATE PO Take 400 mg by mouth 4 (four) times a week.   nitroGLYCERIN 0.4 MG SL tablet Commonly known as: NITROSTAT Place 1 tablet (0.4 mg total) under the tongue every 5 (five) minutes as needed for chest pain.   Rivaroxaban 15 MG Tabs tablet Commonly known as: Xarelto TAKE 1 TABLET(15 MG) BY MOUTH DAILY WITH SUPPER   Theratears 0.25 % Soln Generic drug: Carboxymethylcellulose Sodium Place 1-2 drops into both eyes 3 (three) times daily as needed (dry/irritated eyes.).   VITAMIN D3 PO Take 1 tablet by mouth daily.           Outstanding Labs/Studies   Recommend f/u of CBC, BMET as OP  Duration of Discharge Encounter   Greater than 30 minutes including physician time.  Signed, Charlie Pitter, PA-C 10/14/2021, 11:49 AM

## 2021-10-16 ENCOUNTER — Encounter (HOSPITAL_COMMUNITY): Payer: Self-pay | Admitting: Cardiovascular Disease

## 2021-10-17 ENCOUNTER — Encounter (HOSPITAL_COMMUNITY): Payer: Self-pay | Admitting: Physician Assistant

## 2021-10-17 ENCOUNTER — Ambulatory Visit (HOSPITAL_COMMUNITY)
Admission: RE | Admit: 2021-10-17 | Discharge: 2021-10-17 | Disposition: A | Discharge status: Home / Self Care | Payer: Medicare Other | Source: Ambulatory Visit | Attending: Physician Assistant | Admitting: Physician Assistant

## 2021-10-17 ENCOUNTER — Other Ambulatory Visit: Payer: Self-pay

## 2021-10-17 VITALS — BP 144/82 | HR 54 | Ht 68.0 in | Wt 163.6 lb

## 2021-10-17 DIAGNOSIS — Z7901 Long term (current) use of anticoagulants: Secondary | ICD-10-CM | POA: Diagnosis not present

## 2021-10-17 DIAGNOSIS — E785 Hyperlipidemia, unspecified: Secondary | ICD-10-CM | POA: Insufficient documentation

## 2021-10-17 DIAGNOSIS — E039 Hypothyroidism, unspecified: Secondary | ICD-10-CM | POA: Insufficient documentation

## 2021-10-17 DIAGNOSIS — D6869 Other thrombophilia: Secondary | ICD-10-CM | POA: Diagnosis not present

## 2021-10-17 DIAGNOSIS — I38 Endocarditis, valve unspecified: Secondary | ICD-10-CM | POA: Insufficient documentation

## 2021-10-17 DIAGNOSIS — I4892 Unspecified atrial flutter: Secondary | ICD-10-CM | POA: Diagnosis not present

## 2021-10-17 DIAGNOSIS — I2510 Atherosclerotic heart disease of native coronary artery without angina pectoris: Secondary | ICD-10-CM | POA: Insufficient documentation

## 2021-10-17 DIAGNOSIS — I484 Atypical atrial flutter: Secondary | ICD-10-CM

## 2021-10-17 DIAGNOSIS — I110 Hypertensive heart disease with heart failure: Secondary | ICD-10-CM | POA: Insufficient documentation

## 2021-10-17 DIAGNOSIS — I4819 Other persistent atrial fibrillation: Secondary | ICD-10-CM | POA: Diagnosis not present

## 2021-10-17 DIAGNOSIS — Z79899 Other long term (current) drug therapy: Secondary | ICD-10-CM | POA: Insufficient documentation

## 2021-10-17 DIAGNOSIS — I5032 Chronic diastolic (congestive) heart failure: Secondary | ICD-10-CM | POA: Diagnosis not present

## 2021-10-17 LAB — CBC
HCT: 37.8 % — ABNORMAL LOW (ref 39.0–52.0)
Hemoglobin: 11.9 g/dL — ABNORMAL LOW (ref 13.0–17.0)
MCH: 31.5 pg (ref 26.0–34.0)
MCHC: 31.5 g/dL (ref 30.0–36.0)
MCV: 100 fL (ref 80.0–100.0)
Platelets: 97 10*3/uL — ABNORMAL LOW (ref 150–400)
RBC: 3.78 MIL/uL — ABNORMAL LOW (ref 4.22–5.81)
RDW: 14.2 % (ref 11.5–15.5)
WBC: 4.9 10*3/uL (ref 4.0–10.5)
nRBC: 0 % (ref 0.0–0.2)

## 2021-10-17 LAB — BASIC METABOLIC PANEL
Anion gap: 5 (ref 5–15)
BUN: 35 mg/dL — ABNORMAL HIGH (ref 8–23)
CO2: 23 mmol/L (ref 22–32)
Calcium: 8.8 mg/dL — ABNORMAL LOW (ref 8.9–10.3)
Chloride: 107 mmol/L (ref 98–111)
Creatinine, Ser: 1.25 mg/dL — ABNORMAL HIGH (ref 0.61–1.24)
GFR, Estimated: 56 mL/min — ABNORMAL LOW (ref >60)
Glucose, Bld: 97 mg/dL (ref 70–99)
Potassium: 5.2 mmol/L — ABNORMAL HIGH (ref 3.5–5.1)
Sodium: 135 mmol/L (ref 135–145)

## 2021-10-17 NOTE — Progress Notes (Signed)
Primary Care Physician: [name], MD Primary Cardiologist: Dr Martinique Primary Electrophysiologist: none Referring Physician: Jory Sims NP   Marcus Kaufmann Sr. is a 88 y.o. male with a history of CHF, HTN, CAD, HLD, hypothyroidism, atrial fibrillation who presents for consultation in the [facility]. Patient is on Xarelto for a CHADS2VASC score of 5. He was seen 10/11/21 for unstable angina and had [facility] 10/13/21 which showed 99% RCA lesion treated with a DES, chronic total occlusion of LAD. Additionally, patient was found to be in rate controlled atrial flutter at his visit 09/22/21. He reports that he does not feel much better after his stent was placed. He still becomes fatigued easily. No chest pain.   Today, he denies symptoms of palpitations, chest pain, orthopnea, PND, lower extremity edema, dizziness, presyncope, syncope, snoring, daytime somnolence, bleeding, or neurologic sequela. The patient is tolerating medications without difficulties and is otherwise without complaint today.    Atrial Fibrillation Risk Factors:  he does not have symptoms or diagnosis of sleep apnea. he does not have a history of rheumatic fever.  he has a BMI of Body mass index is 24.88 kg/m.Marland Kitchen Filed Weights   10/17/21 1316  Weight: 74.2 kg    Family History  Problem Relation Age of Onset   Other Mother        Deceased, 80   Heart attack Father        Deceased, 48   Healthy Sister    Healthy Daughter        x 4   Healthy Son        x 3     Atrial Fibrillation Management history:  Previous antiarrhythmic drugs: none Previous cardioversions: 03/24/20, 02/14/21 Previous ablations: none CHADS2VASC score: 5 Anticoagulation history: Xarelto    Past Medical History:  Diagnosis Date   Acute on chronic combined systolic and diastolic CHF (congestive heart failure) ([place]) 08/22/2018   Acute on chronic respiratory failure ([place]) 04/15/2015   Anemia    Atrial  flutter (HCC)    Bradycardia    CAD in native artery 08/22/2018   CAP (community acquired pneumonia) 04/13/2015   See cxr 04/13/2015 > admit [facility]     Complication of anesthesia    H/O hiatal hernia    Hyperlipidemia LDL goal <70 08/22/2018   Hypertension 08/22/2018   Hypothyroidism 04/13/2015   Myasthenia gravis ([place]) 04/13/2015   Obesity 04/13/2015   Paroxysmal A-fib ([place]) 04/16/2015   RBBB    S/P angioplasty with stent 08/21/18 DES to RCA 08/22/2018   Sepsis ([place]) 04/15/2015   Thrombocytopenia ([place])    Past Surgical History:  Procedure Laterality Date   AMPUTATION  06/11/2012   Procedure: AMPUTATION DIGIT;  Surgeon: [name], MD;  Location: [place];  Service: Orthopedics;  Laterality: Left;  revision of amputation   CARDIOVERSION N/A 03/24/2020   Procedure: CARDIOVERSION;  Surgeon: [name], [name], MD;  Location: [facility] ENDOSCOPY;  Service: Cardiovascular;  Laterality: N/A;   CARDIOVERSION N/A 02/14/2021   Procedure: CARDIOVERSION;  Surgeon: [name], MD;  Location: [facility] ENDOSCOPY;  Service: Cardiovascular;  Laterality: N/A;   CORONARY STENT INTERVENTION N/A 08/21/2018   Procedure: CORONARY STENT INTERVENTION;  Surgeon: [name], MD;  Location: [place] CV LAB;  Service: Cardiovascular;  Laterality: N/A;   CORONARY STENT INTERVENTION N/A 10/13/2021   Procedure: CORONARY STENT INTERVENTION;  Surgeon: Burnell Blanks, MD;  Location: [place] CV LAB;  Service: Cardiovascular;  Laterality: N/A;  [place]   LEFT HEART CATH AND CORONARY ANGIOGRAPHY N/A 08/21/2018   Procedure: LEFT HEART CATH AND CORONARY ANGIOGRAPHY;  Surgeon: [name], MD;  Location: [place] CV LAB;  Service: Cardiovascular;  Laterality: N/A;   LEFT HEART CATH AND CORONARY ANGIOGRAPHY N/A 10/13/2021   Procedure: LEFT HEART CATH AND CORONARY ANGIOGRAPHY;  Surgeon: Burnell Blanks, MD;  Location: [place] CV LAB;  Service: Cardiovascular;  Laterality: N/A;    ORIF SHOULDER FRACTURE  06/13/2012   Procedure: OPEN REDUCTION INTERNAL FIXATION (ORIF) SHOULDER FRACTURE;  Surgeon: [name], MD;  Location: [place];  Service: Orthopedics;  Laterality: Left;  LEFT SHOULDER OPEN GREATER TUBEROSITY ORIF   SHOULDER CLOSED REDUCTION  06/11/2012   Procedure: CLOSED MANIPULATION SHOULDER;  Surgeon: [name], MD;  Location: [place];  Service: Orthopedics;  Laterality: Left;    Current Outpatient Medications  Medication Sig Dispense Refill   amLODipine (NORVASC) 10 MG tablet Take 1 tablet (10 mg total) by mouth daily. 90 tablet 1   Carboxymethylcellulose Sodium (THERATEARS) 0.25 % SOLN Place 1-2 drops into both eyes 3 (three) times daily as needed (dry/irritated eyes.).     Cholecalciferol (VITAMIN D3 PO) Take 1 tablet by mouth daily.     clopidogrel (PLAVIX) 75 MG tablet Take 1 tablet (75 mg total) by mouth daily. 90 tablet 1   furosemide (LASIX) 20 MG tablet Take 1 tablet (20 mg total) by mouth daily as needed for fluid. 90 tablet 1   levothyroxine (SYNTHROID) 137 MCG tablet Take 137 mcg by mouth daily before breakfast.     MAGNESIUM GLYCINATE PO Take 400 mg by mouth 4 (four) times a week.     nitroGLYCERIN (NITROSTAT) 0.4 MG SL tablet Place 1 tablet (0.4 mg total) under the tongue every 5 (five) minutes as needed for chest pain. 30 tablet 6   Rivaroxaban (XARELTO) 15 MG TABS tablet TAKE 1 TABLET(15 MG) BY MOUTH DAILY WITH SUPPER 90 tablet 1   No current facility-administered medications for this encounter.    Allergies  Allergen Reactions   Ace Inhibitors Hives   Beta Adrenergic Blockers Hives   Statins Hives    Social History   Socioeconomic History   Marital status: Divorced    Spouse name: Not on file   Number of children: 7   Years of education: Not on file   Highest education level: Not on file  Occupational History   Not on file  Tobacco Use   Smoking status: Former    Packs/day: 1.00    Years: 30.00    Pack years: 30.00     Types: Cigarettes    Quit date: 01/10/1982    Years since quitting: 39.7   Smokeless tobacco: Never   Tobacco comments:    Former smoker 10/17/2021  Vaping Use   Vaping Use: Never used  Substance and Sexual Activity   Alcohol use: Not Currently    Alcohol/week: 2.0 standard drinks    Types: 2 Glasses of wine per week    Comment: No alcohol since 10/14/2021   Drug use: No   Sexual activity: Never  Other Topics Concern   Not on file  Social History Narrative   Lives alone in a two story home.  Divorced.  Has 7 children.     Retired from [employer].     Education: some college.   Right Handed   Social Determinants of Health   Financial Resource Strain: Not on file  Food Insecurity: Not on file  Transportation Needs: Not on file  Physical Activity: Not on file  Stress: Not on file  Social Connections: Not on file  Intimate Partner Violence: Not on file     ROS- All systems are reviewed and negative except as per the HPI above.  Physical Exam: Vitals:   10/17/21 1316  BP: (!) 144/82  Pulse: (!) 54  Weight: 74.2 kg  Height: 5\' 8"  (1.727 m)    GEN- The patient is a well appearing elderly male, alert and oriented x 3 today.   Head- normocephalic, atraumatic Eyes-  Sclera clear, conjunctiva pink Ears- hearing intact Oropharynx- clear Neck- supple  Lungs- Clear to ausculation bilaterally, normal work of breathing Heart- irregular rate and rhythm, no murmurs, rubs or gallops  GI- soft, NT, ND, + BS Extremities- no clubbing, cyanosis, or edema MS- no significant deformity or atrophy Skin- no rash or lesion Psych- euthymic mood, full affect Neuro- strength and sensation are intact  Wt Readings from Last 3 Encounters:  10/17/21 74.2 kg  10/13/21 74.4 kg  10/11/21 74.4 kg    EKG today demonstrates  Atypical atrial flutter, RBBB, LAFB Vent. rate 54 BPM PR interval 102 ms QRS duration 154 ms QT/QTcB 498/472 ms  Echo 10/11/21 demonstrated   1. Left  ventricular ejection fraction, by estimation, is 55%. The left  ventricle has normal function. The left ventricle demonstrates regional  wall motion abnormalities with severe apical and apical septal  hypokinesis. There is mild left ventricular hypertrophy. Left ventricular diastolic parameters are consistent with Grade II diastolic dysfunction (pseudonormalization). The average left ventricular global longitudinal strain is -16.8 %. The global longitudinal strain is abnormal.   2. Right ventricular systolic function is normal. The right ventricular  size is mildly enlarged. There is severely elevated pulmonary artery  systolic pressure. The estimated right ventricular systolic pressure is  42.8 mmHg.   3. Left atrial size was moderately dilated.   4. Right atrial size was severely dilated.   5. The mitral valve is abnormal. Mild to moderate mitral valve  regurgitation. No evidence of mitral stenosis. Moderate mitral annular  calcification.   6. Tricuspid valve regurgitation is moderate to severe.   7. The aortic valve is tricuspid. Aortic valve regurgitation is moderate.  Mild aortic valve stenosis. Aortic valve mean gradient measures 14.0 mmHg.   8. Aortic dilatation noted. There is mild dilatation of the ascending  aorta, measuring 40 mm.   9. The inferior vena cava is dilated in size with <50% respiratory  variability, suggesting right atrial pressure of 15 mmHg.   Comparison(s): 02/13/21 EF 60-65%. PA pressure 58mmHg. Mild AS 17mmHg mean PG, 4mmHg peak PG.   Epic records are reviewed at length today  CHA2DS2-VASc Score = 5  The patient's score is based upon: CHF History: 1 HTN History: 1 Diabetes History: 0 Stroke History: 0 Vascular Disease History: 1 Age Score: 2 Gender Score: 0       ASSESSMENT AND PLAN: 1. Persistent Atrial Fibrillation/atrial flutter The patient's CHA2DS2-VASc score is 5, indicating a 7.2% annual risk of stroke.   Patient remains in atrial flutter,  symptomatic despite rate control.  We discussed rhythm control options today. He would like to avoid AAD if at all possible. Will plan for DCCV alone. This will need to be at least 3 weeks from [facility] since his Xarelto was held.  Continue Xarelto 15 mg daily. Could consider changing to Eliquis if CrCl is very close to 50 mL/min  to avoid ambiguity in dosing.  Check CBC/bmet today. Will need to watch platelet count closely.   2. Secondary Hypercoagulable State (ICD10:  D68.69) The patient is at significant risk for stroke/thromboembolism based upon his CHA2DS2-VASc Score of 5.  Continue Rivaroxaban (Xarelto).   3. HTN Stable, no changes today.  4. CAD DES 10/13/21 Currently on Plavix Has not tolerated BB or statin in the past. No anginal symptoms today.  5. Chronic diastolic CHF Appears euvolemic today.  6. Valvular heart disease Mild-mod MR Mod AR, mild AS Mod-severe TR   Follow up with Jory Sims NP as scheduled. AF clinic if he has quick return of atrial flutter or he would like to proceed with AAD.    [facility] [address] [place], [place] [address] [phone] 10/17/2021 1:38 PM

## 2021-10-17 NOTE — Patient Instructions (Signed)
Cardioversion scheduled for Friday, February 3rd  -Come to clinic for labs at [place] at the [facility] and go to admitting at 930AM  - Do not eat or drink anything after midnight the night prior to your procedure.  - Take all your morning medication (except diabetic medications) with a sip of water prior to arrival.  - You will not be able to drive home after your procedure.  - Do NOT miss any doses of your blood thinner - if you should miss a dose please notify our office immediately.  - If you feel as if you go back into normal rhythm prior to scheduled cardioversion, please notify our office immediately. If your procedure is canceled in the cardioversion suite you will be charged a cancellation fee. Patients will be asked to: to mask in public and hand hygiene (no longer quarantine) in the 3 days prior to surgery, to report if any COVID-19-like illness or household contacts to COVID-19 to determine need for testing       Will have Jory Sims appt moved out until after cardioversion for follow up

## 2021-10-17 NOTE — H&P (View-Only) (Signed)
Primary Care Physician: [name], MD Primary Cardiologist: Dr Martinique Primary Electrophysiologist: none Referring Physician: Jory Sims NP   Marcus Kaufmann Sr. is a 88 y.o. male with a history of CHF, HTN, CAD, HLD, hypothyroidism, atrial fibrillation who presents for consultation in the [facility]. Patient is on Xarelto for a CHADS2VASC score of 5. He was seen 10/11/21 for unstable angina and had [facility] 10/13/21 which showed 99% RCA lesion treated with a DES, chronic total occlusion of LAD. Additionally, patient was found to be in rate controlled atrial flutter at his visit 09/22/21. He reports that he does not feel much better after his stent was placed. He still becomes fatigued easily. No chest pain.   Today, he denies symptoms of palpitations, chest pain, orthopnea, PND, lower extremity edema, dizziness, presyncope, syncope, snoring, daytime somnolence, bleeding, or neurologic sequela. The patient is tolerating medications without difficulties and is otherwise without complaint today.    Atrial Fibrillation Risk Factors:  he does not have symptoms or diagnosis of sleep apnea. he does not have a history of rheumatic fever.  he has a BMI of Body mass index is 24.88 kg/m.Marland Kitchen Filed Weights   10/17/21 1316  Weight: 74.2 kg    Family History  Problem Relation Age of Onset   Other Mother        Deceased, 60   Heart attack Father        Deceased, 32   Healthy Sister    Healthy Daughter        x 4   Healthy Son        x 3     Atrial Fibrillation Management history:  Previous antiarrhythmic drugs: none Previous cardioversions: 03/24/20, 02/14/21 Previous ablations: none CHADS2VASC score: 5 Anticoagulation history: Xarelto    Past Medical History:  Diagnosis Date   Acute on chronic combined systolic and diastolic CHF (congestive heart failure) ([place]) 08/22/2018   Acute on chronic respiratory failure ([place]) 04/15/2015   Anemia    Atrial  flutter (HCC)    Bradycardia    CAD in native artery 08/22/2018   CAP (community acquired pneumonia) 04/13/2015   See cxr 04/13/2015 > admit [facility]     Complication of anesthesia    H/O hiatal hernia    Hyperlipidemia LDL goal <70 08/22/2018   Hypertension 08/22/2018   Hypothyroidism 04/13/2015   Myasthenia gravis ([place]) 04/13/2015   Obesity 04/13/2015   Paroxysmal A-fib ([place]) 04/16/2015   RBBB    S/P angioplasty with stent 08/21/18 DES to RCA 08/22/2018   Sepsis ([place]) 04/15/2015   Thrombocytopenia ([place])    Past Surgical History:  Procedure Laterality Date   AMPUTATION  06/11/2012   Procedure: AMPUTATION DIGIT;  Surgeon: [name], MD;  Location: [place];  Service: Orthopedics;  Laterality: Left;  revision of amputation   CARDIOVERSION N/A 03/24/2020   Procedure: CARDIOVERSION;  Surgeon: [name], [name], MD;  Location: [facility] ENDOSCOPY;  Service: Cardiovascular;  Laterality: N/A;   CARDIOVERSION N/A 02/14/2021   Procedure: CARDIOVERSION;  Surgeon: [name], MD;  Location: [facility] ENDOSCOPY;  Service: Cardiovascular;  Laterality: N/A;   CORONARY STENT INTERVENTION N/A 08/21/2018   Procedure: CORONARY STENT INTERVENTION;  Surgeon: [name], MD;  Location: [place] CV LAB;  Service: Cardiovascular;  Laterality: N/A;   CORONARY STENT INTERVENTION N/A 10/13/2021   Procedure: CORONARY STENT INTERVENTION;  Surgeon: Burnell Blanks, MD;  Location: [place] CV LAB;  Service: Cardiovascular;  Laterality: N/A;  [place]   LEFT HEART CATH AND CORONARY ANGIOGRAPHY N/A 08/21/2018   Procedure: LEFT HEART CATH AND CORONARY ANGIOGRAPHY;  Surgeon: [name], MD;  Location: [place] CV LAB;  Service: Cardiovascular;  Laterality: N/A;   LEFT HEART CATH AND CORONARY ANGIOGRAPHY N/A 10/13/2021   Procedure: LEFT HEART CATH AND CORONARY ANGIOGRAPHY;  Surgeon: Burnell Blanks, MD;  Location: [place] CV LAB;  Service: Cardiovascular;  Laterality: N/A;    ORIF SHOULDER FRACTURE  06/13/2012   Procedure: OPEN REDUCTION INTERNAL FIXATION (ORIF) SHOULDER FRACTURE;  Surgeon: [name], MD;  Location: [place];  Service: Orthopedics;  Laterality: Left;  LEFT SHOULDER OPEN GREATER TUBEROSITY ORIF   SHOULDER CLOSED REDUCTION  06/11/2012   Procedure: CLOSED MANIPULATION SHOULDER;  Surgeon: [name], MD;  Location: [place];  Service: Orthopedics;  Laterality: Left;    Current Outpatient Medications  Medication Sig Dispense Refill   amLODipine (NORVASC) 10 MG tablet Take 1 tablet (10 mg total) by mouth daily. 90 tablet 1   Carboxymethylcellulose Sodium (THERATEARS) 0.25 % SOLN Place 1-2 drops into both eyes 3 (three) times daily as needed (dry/irritated eyes.).     Cholecalciferol (VITAMIN D3 PO) Take 1 tablet by mouth daily.     clopidogrel (PLAVIX) 75 MG tablet Take 1 tablet (75 mg total) by mouth daily. 90 tablet 1   furosemide (LASIX) 20 MG tablet Take 1 tablet (20 mg total) by mouth daily as needed for fluid. 90 tablet 1   levothyroxine (SYNTHROID) 137 MCG tablet Take 137 mcg by mouth daily before breakfast.     MAGNESIUM GLYCINATE PO Take 400 mg by mouth 4 (four) times a week.     nitroGLYCERIN (NITROSTAT) 0.4 MG SL tablet Place 1 tablet (0.4 mg total) under the tongue every 5 (five) minutes as needed for chest pain. 30 tablet 6   Rivaroxaban (XARELTO) 15 MG TABS tablet TAKE 1 TABLET(15 MG) BY MOUTH DAILY WITH SUPPER 90 tablet 1   No current facility-administered medications for this encounter.    Allergies  Allergen Reactions   Ace Inhibitors Hives   Beta Adrenergic Blockers Hives   Statins Hives    Social History   Socioeconomic History   Marital status: Divorced    Spouse name: Not on file   Number of children: 7   Years of education: Not on file   Highest education level: Not on file  Occupational History   Not on file  Tobacco Use   Smoking status: Former    Packs/day: 1.00    Years: 30.00    Pack years: 30.00     Types: Cigarettes    Quit date: 01/10/1982    Years since quitting: 39.7   Smokeless tobacco: Never   Tobacco comments:    Former smoker 10/17/2021  Vaping Use   Vaping Use: Never used  Substance and Sexual Activity   Alcohol use: Not Currently    Alcohol/week: 2.0 standard drinks    Types: 2 Glasses of wine per week    Comment: No alcohol since 10/14/2021   Drug use: No   Sexual activity: Never  Other Topics Concern   Not on file  Social History Narrative   Lives alone in a two story home.  Divorced.  Has 7 children.     Retired from [employer].     Education: some college.   Right Handed   Social Determinants of Health   Financial Resource Strain: Not on file  Food Insecurity: Not on file  Transportation Needs: Not on file  Physical Activity: Not on file  Stress: Not on file  Social Connections: Not on file  Intimate Partner Violence: Not on file     ROS- All systems are reviewed and negative except as per the HPI above.  Physical Exam: Vitals:   10/17/21 1316  BP: (!) 144/82  Pulse: (!) 54  Weight: 74.2 kg  Height: 5\' 8"  (1.727 m)    GEN- The patient is a well appearing elderly male, alert and oriented x 3 today.   Head- normocephalic, atraumatic Eyes-  Sclera clear, conjunctiva pink Ears- hearing intact Oropharynx- clear Neck- supple  Lungs- Clear to ausculation bilaterally, normal work of breathing Heart- irregular rate and rhythm, no murmurs, rubs or gallops  GI- soft, NT, ND, + BS Extremities- no clubbing, cyanosis, or edema MS- no significant deformity or atrophy Skin- no rash or lesion Psych- euthymic mood, full affect Neuro- strength and sensation are intact  Wt Readings from Last 3 Encounters:  10/17/21 74.2 kg  10/13/21 74.4 kg  10/11/21 74.4 kg    EKG today demonstrates  Atypical atrial flutter, RBBB, LAFB Vent. rate 54 BPM PR interval 102 ms QRS duration 154 ms QT/QTcB 498/472 ms  Echo 10/11/21 demonstrated   1. Left  ventricular ejection fraction, by estimation, is 55%. The left  ventricle has normal function. The left ventricle demonstrates regional  wall motion abnormalities with severe apical and apical septal  hypokinesis. There is mild left ventricular hypertrophy. Left ventricular diastolic parameters are consistent with Grade II diastolic dysfunction (pseudonormalization). The average left ventricular global longitudinal strain is -16.8 %. The global longitudinal strain is abnormal.   2. Right ventricular systolic function is normal. The right ventricular  size is mildly enlarged. There is severely elevated pulmonary artery  systolic pressure. The estimated right ventricular systolic pressure is  16.1 mmHg.   3. Left atrial size was moderately dilated.   4. Right atrial size was severely dilated.   5. The mitral valve is abnormal. Mild to moderate mitral valve  regurgitation. No evidence of mitral stenosis. Moderate mitral annular  calcification.   6. Tricuspid valve regurgitation is moderate to severe.   7. The aortic valve is tricuspid. Aortic valve regurgitation is moderate.  Mild aortic valve stenosis. Aortic valve mean gradient measures 14.0 mmHg.   8. Aortic dilatation noted. There is mild dilatation of the ascending  aorta, measuring 40 mm.   9. The inferior vena cava is dilated in size with <50% respiratory  variability, suggesting right atrial pressure of 15 mmHg.   Comparison(s): 02/13/21 EF 60-65%. PA pressure 41mmHg. Mild AS 75mmHg mean PG, 24mmHg peak PG.   Epic records are reviewed at length today  CHA2DS2-VASc Score = 5  The patient's score is based upon: CHF History: 1 HTN History: 1 Diabetes History: 0 Stroke History: 0 Vascular Disease History: 1 Age Score: 2 Gender Score: 0       ASSESSMENT AND PLAN: 1. Persistent Atrial Fibrillation/atrial flutter The patient's CHA2DS2-VASc score is 5, indicating a 7.2% annual risk of stroke.   Patient remains in atrial flutter,  symptomatic despite rate control.  We discussed rhythm control options today. He would like to avoid AAD if at all possible. Will plan for DCCV alone. This will need to be at least 3 weeks from [facility] since his Xarelto was held.  Continue Xarelto 15 mg daily. Could consider changing to Eliquis if CrCl is very close to 50 mL/min  to avoid ambiguity in dosing.  Check CBC/bmet today. Will need to watch platelet count closely.   2. Secondary Hypercoagulable State (ICD10:  D68.69) The patient is at significant risk for stroke/thromboembolism based upon his CHA2DS2-VASc Score of 5.  Continue Rivaroxaban (Xarelto).   3. HTN Stable, no changes today.  4. CAD DES 10/13/21 Currently on Plavix Has not tolerated BB or statin in the past. No anginal symptoms today.  5. Chronic diastolic CHF Appears euvolemic today.  6. Valvular heart disease Mild-mod MR Mod AR, mild AS Mod-severe TR   Follow up with Jory Sims NP as scheduled. AF clinic if he has quick return of atrial flutter or he would like to proceed with AAD.    [facility] [address] [place], [place] [address] [phone] 10/17/2021 1:38 PM

## 2021-10-18 ENCOUNTER — Encounter: Payer: Self-pay | Admitting: Internal Medicine

## 2021-10-18 ENCOUNTER — Telehealth (HOSPITAL_COMMUNITY): Payer: Self-pay

## 2021-10-18 NOTE — Telephone Encounter (Signed)
Pt insurance is active and benefits verified through Medicare a/b Co-pay 0, DED $226/0 met, out of pocket 0/0 met, co-insurance 20%. no pre-authorization required.   2ndary insurance is active and benefits verified through [employer]. Co-pay 0, DED 0/0 met, out of pocket 0/0 met, co-insurance 0%. No pre-authorization required.  Will contact patient to see if he is interested in the Cardiac Rehab Program. If interested, patient will need to complete follow up appt. Once completed, patient will be contacted for scheduling upon review by the RN Navigator.

## 2021-10-18 NOTE — Telephone Encounter (Signed)
Called patient to see if he is interested in the Cardiac Rehab Program. Patient expressed interest. Explained scheduling process and went over insurance, patient verbalized understanding. Will contact patient for scheduling once f/u has been completed.  °

## 2021-10-19 ENCOUNTER — Encounter: Payer: Self-pay | Admitting: Internal Medicine

## 2021-10-26 ENCOUNTER — Ambulatory Visit (INDEPENDENT_AMBULATORY_CARE_PROVIDER_SITE_OTHER): Payer: Medicare Other

## 2021-10-26 ENCOUNTER — Encounter (HOSPITAL_COMMUNITY): Payer: Self-pay | Admitting: Cardiology

## 2021-10-26 ENCOUNTER — Other Ambulatory Visit: Payer: Self-pay

## 2021-10-26 DIAGNOSIS — E538 Deficiency of other specified B group vitamins: Secondary | ICD-10-CM

## 2021-10-26 MED ORDER — CYANOCOBALAMIN 1000 MCG/ML IJ SOLN
1000.0000 ug | Freq: Once | INTRAMUSCULAR | Status: AC
  Administered 2021-10-26: 1000 ug via INTRAMUSCULAR

## 2021-10-26 NOTE — Progress Notes (Signed)
Attempted to obtain medical history via telephone, unable to reach at this time. I left a voicemail to return pre surgical testing department's phone call.  

## 2021-11-02 ENCOUNTER — Ambulatory Visit: Payer: Medicare Other | Admitting: Internal Medicine

## 2021-11-03 ENCOUNTER — Ambulatory Visit (HOSPITAL_COMMUNITY): Payer: Medicare Other | Admitting: Anesthesiology

## 2021-11-03 ENCOUNTER — Encounter (HOSPITAL_COMMUNITY)
Admission: RE | Disposition: A | Discharge status: Home / Self Care | Payer: Medicare Other | Source: Home / Self Care | Attending: Cardiology

## 2021-11-03 ENCOUNTER — Ambulatory Visit: Payer: Medicare Other | Admitting: Adult Health

## 2021-11-03 ENCOUNTER — Ambulatory Visit (HOSPITAL_COMMUNITY)
Admission: RE | Admit: 2021-11-03 | Discharge: 2021-11-03 | Disposition: A | Discharge status: Home / Self Care | Payer: Medicare Other | Source: Ambulatory Visit | Attending: Physician Assistant | Admitting: Physician Assistant

## 2021-11-03 ENCOUNTER — Other Ambulatory Visit: Payer: Self-pay

## 2021-11-03 ENCOUNTER — Ambulatory Visit (HOSPITAL_COMMUNITY)
Admission: RE | Admit: 2021-11-03 | Discharge: 2021-11-03 | Disposition: A | Discharge status: Home / Self Care | Payer: Medicare Other | Attending: Cardiology | Admitting: Cardiology

## 2021-11-03 ENCOUNTER — Encounter (HOSPITAL_COMMUNITY): Payer: Self-pay | Admitting: Cardiology

## 2021-11-03 DIAGNOSIS — I252 Old myocardial infarction: Secondary | ICD-10-CM | POA: Diagnosis not present

## 2021-11-03 DIAGNOSIS — I5040 Unspecified combined systolic (congestive) and diastolic (congestive) heart failure: Secondary | ICD-10-CM | POA: Insufficient documentation

## 2021-11-03 DIAGNOSIS — I4892 Unspecified atrial flutter: Secondary | ICD-10-CM | POA: Diagnosis not present

## 2021-11-03 DIAGNOSIS — I4891 Unspecified atrial fibrillation: Secondary | ICD-10-CM | POA: Insufficient documentation

## 2021-11-03 DIAGNOSIS — I2510 Atherosclerotic heart disease of native coronary artery without angina pectoris: Secondary | ICD-10-CM | POA: Diagnosis not present

## 2021-11-03 DIAGNOSIS — I484 Atypical atrial flutter: Secondary | ICD-10-CM | POA: Diagnosis not present

## 2021-11-03 DIAGNOSIS — I110 Hypertensive heart disease with heart failure: Secondary | ICD-10-CM | POA: Insufficient documentation

## 2021-11-03 DIAGNOSIS — I4811 Longstanding persistent atrial fibrillation: Secondary | ICD-10-CM | POA: Diagnosis not present

## 2021-11-03 DIAGNOSIS — K449 Diaphragmatic hernia without obstruction or gangrene: Secondary | ICD-10-CM | POA: Insufficient documentation

## 2021-11-03 DIAGNOSIS — E039 Hypothyroidism, unspecified: Secondary | ICD-10-CM | POA: Diagnosis not present

## 2021-11-03 DIAGNOSIS — I509 Heart failure, unspecified: Secondary | ICD-10-CM | POA: Diagnosis not present

## 2021-11-03 DIAGNOSIS — N179 Acute kidney failure, unspecified: Secondary | ICD-10-CM | POA: Diagnosis not present

## 2021-11-03 HISTORY — PX: CARDIOVERSION: SHX1299

## 2021-11-03 LAB — BASIC METABOLIC PANEL
Anion gap: 9 (ref 5–15)
BUN: 34 mg/dL — ABNORMAL HIGH (ref 8–23)
CO2: 21 mmol/L — ABNORMAL LOW (ref 22–32)
Calcium: 8.8 mg/dL — ABNORMAL LOW (ref 8.9–10.3)
Chloride: 108 mmol/L (ref 98–111)
Creatinine, Ser: 1.11 mg/dL (ref 0.61–1.24)
GFR, Estimated: >60 mL/min (ref >60)
Glucose, Bld: 93 mg/dL (ref 70–99)
Potassium: 5.2 mmol/L — ABNORMAL HIGH (ref 3.5–5.1)
Sodium: 138 mmol/L (ref 135–145)

## 2021-11-03 LAB — CBC
HCT: 37.5 % — ABNORMAL LOW (ref 39.0–52.0)
Hemoglobin: 12.3 g/dL — ABNORMAL LOW (ref 13.0–17.0)
MCH: 32.3 pg (ref 26.0–34.0)
MCHC: 32.8 g/dL (ref 30.0–36.0)
MCV: 98.4 fL (ref 80.0–100.0)
Platelets: 122 10*3/uL — ABNORMAL LOW (ref 150–400)
RBC: 3.81 MIL/uL — ABNORMAL LOW (ref 4.22–5.81)
RDW: 14.5 % (ref 11.5–15.5)
WBC: 6 10*3/uL (ref 4.0–10.5)
nRBC: 0 % (ref 0.0–0.2)

## 2021-11-03 SURGERY — CARDIOVERSION
Anesthesia: General

## 2021-11-03 MED ORDER — FUROSEMIDE 10 MG/ML IJ SOLN
40.0000 mg | Freq: Once | INTRAMUSCULAR | Status: AC
Start: 2021-11-03 — End: 2021-11-03
  Administered 2021-11-03: 40 mg via INTRAVENOUS

## 2021-11-03 MED ORDER — FUROSEMIDE 10 MG/ML IJ SOLN
INTRAMUSCULAR | Status: AC
  Filled 2021-11-03: qty 4

## 2021-11-03 MED ORDER — SODIUM CHLORIDE 0.9 % IV SOLN: INTRAVENOUS | Status: DC

## 2021-11-03 MED ORDER — LIDOCAINE 2% (20 MG/ML) 5 ML SYRINGE
INTRAMUSCULAR | Status: DC | PRN
  Administered 2021-11-03: 100 mg via INTRAVENOUS

## 2021-11-03 MED ORDER — PROPOFOL 10 MG/ML IV BOLUS
INTRAVENOUS | Status: DC | PRN
Start: 2021-11-03 — End: 2021-11-03
  Administered 2021-11-03: 60 mg via INTRAVENOUS

## 2021-11-03 NOTE — Interval H&P Note (Signed)
History and Physical Interval Note:  11/03/2021 9:40 AM  Marcus A Godby Sr.  has presented today for surgery, with the diagnosis of AFIB.  The various methods of treatment have been discussed with the patient and family. After consideration of risks, benefits and other options for treatment, the patient has consented to  Procedure(s): CARDIOVERSION (N/A) as a surgical intervention.  The patient's history has been reviewed, patient examined, no change in status, stable for surgery.  I have reviewed the patient's chart and labs.  Questions were answered to the patient's satisfaction.    He is bradycardic in flutter on no nodal agents. Atropine at bedside, discussed risk of bradycardia extensively.   [name]

## 2021-11-03 NOTE — Transfer of Care (Signed)
Immediate Anesthesia Transfer of Care Note  Patient: Marcus A Chapa Sr.  Procedure(s) Performed: CARDIOVERSION  Patient Location: Short Stay  Anesthesia Type:MAC  Level of Consciousness: drowsy  Airway & Oxygen Therapy: Patient Spontanous Breathing  Post-op Assessment: Report given to RN and Post -op Vital signs reviewed and stable  Post vital signs: Reviewed and stable  Last Vitals:  Vitals Value Taken Time  BP 180/81   Temp    Pulse 71   Resp 12   SpO2 100     Last Pain:  Vitals:   11/03/21 0942  TempSrc: Oral  PainSc:       Patients Stated Pain Goal: 0 (40/34/74 2595)  Complications: No notable events documented.

## 2021-11-03 NOTE — Anesthesia Procedure Notes (Signed)
Procedure Name: MAC Date/Time: 11/03/2021 9:45 AM Performed by: Erick Colace, CRNA Pre-anesthesia Checklist: Patient identified, Emergency Drugs available, Suction available and Patient being monitored Patient Re-evaluated:Patient Re-evaluated prior to induction Oxygen Delivery Method: Ambu bag Preoxygenation: Pre-oxygenation with 100% oxygen Induction Type: IV induction

## 2021-11-03 NOTE — Anesthesia Preprocedure Evaluation (Signed)
Anesthesia Evaluation  Patient identified by MRN, date of birth, ID band Patient awake    Reviewed: Allergy & Precautions, NPO status , Patient's Chart, lab work & pertinent test results, reviewed documented beta blocker date and time   History of Anesthesia Complications (+) history of anesthetic complications  Airway Mallampati: II  TM Distance: >3 FB Neck ROM: Full    Dental  (+) Poor Dentition, Caps, Dental Advisory Given, Chipped,    Pulmonary shortness of breath and with exertion, asthma , pneumonia, former smoker,    breath sounds clear to auscultation + decreased breath sounds      Cardiovascular hypertension, Pt. on medications + CAD, + Past MI, + Cardiac Stents and +CHF  + dysrhythmias Atrial Fibrillation  Rhythm:Irregular Rate:Normal  EKG 01/27/21 Atrial flutter, LVH, RBBB, LAFB  Echo 02/03/21 1. Left ventricular ejection fraction, by estimation, is 60 to 65%. The left ventricle has normal function. The left ventricle has no regional wall motion abnormalities. There is mild left ventricular hypertrophy. Left ventricular diastolic function could not be evaluated.  2. Right ventricular systolic function is normal. The right ventricular size is normal. There is normal pulmonary artery systolic pressure. The estimated right ventricular systolic pressure is 49.7 mmHg.  3. Left atrial size was moderately dilated.  4. Right atrial size was moderately dilated.  5. The mitral valve is degenerative. Mild mitral valve regurgitation. Moderate mitral annular calcification.  6. The tricuspid valve is abnormal. Tricuspid valve regurgitation is moderate.  7. The aortic valve is tricuspid. Aortic valve regurgitation is mild to moderate. Mild aortic valve stenosis. Aortic valve area, by VTI measures 1.70 cm. Aortic valve mean gradient measures 15.2 mmHg. Aortic valve Vmax  measures 2.62 m/s.  8. Aortic dilatation noted. There is mild  dilatation of the ascending aorta, measuring 40 mm.  9. The inferior vena cava is dilated in size with >50% respiratory variability, suggesting right atrial pressure of 8 mmHg.   Comparison(s): Changes from prior study are noted. 03/07/2020: LVEF 60-65%,  severe biatrial enlargement, moderate TR, mild AI.    Neuro/Psych Myasthenia gravis  Neuromuscular disease negative psych ROS   GI/Hepatic Neg liver ROS, hiatal hernia,   Endo/Other  Hypothyroidism Hyperlipidemia  Renal/GU ARFRenal disease  negative genitourinary   Musculoskeletal negative musculoskeletal ROS (+)   Abdominal   Peds  Hematology Xarelto therapy- last dose 5/16 no missed dosages   Anesthesia Other Findings   Reproductive/Obstetrics                             Anesthesia Physical  Anesthesia Plan  ASA: 3  Anesthesia Plan: General   Post-op Pain Management:    Induction: Intravenous  PONV Risk Score and Plan: 2 and Treatment may vary due to age or medical condition  Airway Management Planned: Natural Airway and Mask  Additional Equipment:   Intra-op Plan:   Post-operative Plan:   Informed Consent: I have reviewed the patients History and Physical, chart, labs and discussed the procedure including the risks, benefits and alternatives for the proposed anesthesia with the patient or authorized representative who has indicated his/her understanding and acceptance.     Dental advisory given  Plan Discussed with: CRNA and Anesthesiologist  Anesthesia Plan Comments:         Anesthesia Quick Evaluation

## 2021-11-03 NOTE — CV Procedure (Signed)
Procedure:   DCCV  Indication:  Symptomatic atrial fibrillation/flutter  Procedure Note:  The patient signed informed consent.  They have had had therapeutic anticoagulation with rivaroxaban greater than 3 weeks.  Anesthesia was administered by Dr. Tobias Alexander.  Patient received 100 mg IV lidocaine and 60 mg IV propofol.Adequate airway was maintained throughout and vital followed per protocol.  They were cardioverted x 1 with 120J of biphasic synchronized energy.  They converted to NSR.  There were no apparent complications.  The patient had normal neuro status and respiratory status post procedure with vitals stable as recorded elsewhere.    Follow up:  They will continue on current medical therapy and follow up with cardiology as scheduled.  We will given him a dose of 40 mg IV lasix just prior to discharge. He lives about 5 min away--we will provide a urinal for transport just in case.  Buford Dresser, MD PhD 11/03/2021 10:06 AM

## 2021-11-03 NOTE — Anesthesia Postprocedure Evaluation (Signed)
Anesthesia Post Note  Patient: Marcus A Mcmullen Sr.  Procedure(s) Performed: CARDIOVERSION     Patient location during evaluation: PACU Anesthesia Type: General Level of consciousness: sedated Pain management: pain level controlled Vital Signs Assessment: post-procedure vital signs reviewed and stable Respiratory status: spontaneous breathing and respiratory function stable Cardiovascular status: stable Postop Assessment: no apparent nausea or vomiting Anesthetic complications: no   No notable events documented.  Last Vitals:  Vitals:   11/03/21 1031 11/03/21 1042  BP: (!) 188/92 (!) 195/94  Pulse: 76 80  Resp: 16 18  Temp:    SpO2: 98% 99%    Last Pain:  Vitals:   11/03/21 1042  TempSrc:   PainSc: 0-No pain                 [name]

## 2021-11-06 ENCOUNTER — Encounter (HOSPITAL_COMMUNITY): Payer: Self-pay | Admitting: Cardiology

## 2021-11-07 ENCOUNTER — Ambulatory Visit: Payer: Medicare Other | Admitting: Internal Medicine

## 2021-11-10 ENCOUNTER — Encounter: Payer: Self-pay | Admitting: Internal Medicine

## 2021-11-10 ENCOUNTER — Other Ambulatory Visit: Payer: Self-pay

## 2021-11-10 ENCOUNTER — Ambulatory Visit (INDEPENDENT_AMBULATORY_CARE_PROVIDER_SITE_OTHER): Payer: Medicare Other | Admitting: Internal Medicine

## 2021-11-10 VITALS — BP 130/76 | HR 58 | Resp 20 | Ht 68.0 in | Wt 155.6 lb

## 2021-11-10 DIAGNOSIS — I2510 Atherosclerotic heart disease of native coronary artery without angina pectoris: Secondary | ICD-10-CM | POA: Diagnosis not present

## 2021-11-10 MED ORDER — SPIRONOLACTONE 25 MG PO TABS: 12.5000 mg | ORAL_TABLET | Freq: Every day | ORAL | 3 refills | Status: DC

## 2021-11-10 MED ORDER — FUROSEMIDE 20 MG PO TABS: 20.0000 mg | ORAL_TABLET | Freq: Every day | ORAL | 3 refills | Status: DC

## 2021-11-10 NOTE — Patient Instructions (Signed)
Medication Instructions:  START: SPIRONOLACTONE 12.5mg  (1/2) TABLET ONCE DAILY  START: LASIX 20mg  ONCE DAILY  *If you need a refill on your cardiac medications before your next appointment, please call your pharmacy*  Lab Work: Please return for Blood Work in [place]. No appointment needed, lab here at the office is open Monday-Friday from 8AM to 4PM and closed daily for lunch from 12:45-1:45.   If you have labs (blood work) drawn today and your tests are completely normal, you will receive your results only by: [place] (if you have MyChart) OR A paper copy in the mail If you have any lab test that is abnormal or we need to change your treatment, we will call you to review the results.  Follow-Up: At [facility], you and your health needs are our priority.  As part of our continuing mission to provide you with exceptional heart care, we have created designated Provider Care Teams.  These Care Teams include your primary Cardiologist (physician) and Advanced Practice Providers (APPs -  Physician Assistants and Nurse Practitioners) who all work together to provide you with the care you need, when you need it.  We recommend signing up for the patient portal called "MyChart".  Sign up information is provided on this After Visit Summary.  MyChart is used to connect with patients for Virtual Visits (Telemedicine).  Patients are able to view lab/test results, encounter notes, upcoming appointments, etc.  Non-urgent messages can be sent to your provider as well.   To learn more about what you can do with MyChart, go to [email or web address].    Your next appointment:   6 month(s)  The format for your next appointment:   In Person  Provider:  Dr. Harl Bowie

## 2021-11-10 NOTE — Progress Notes (Signed)
Cardiology Office Note:    Date:  11/10/2021   ID:  Marcus [name] Sr., DOB 03/09/1932, MRN [id number]  PCP:  [name], MD   [facility] Providers Cardiologist:  Peter Martinique, MD     Referring MD: [name], MD   No chief complaint on file. Initial Visit Positive stress test  History of Present Illness:    Prior HPI:  Marcus A Marlette Sr. is a 88 y.o. male who is seen for follow up CAD. He has a history of myasthenia gravis and remote PAF in the setting of pneumonia July 2016.  He presented 08/20/2018 with a non-ST elevation MI.  08/21/2018 he underwent diagnostic catheterization and intervention with a DES to his RCA.  Patient did have a residual total LAD that Dr [name] attempted to cross but it appeared the wire got subintimal and so the procedure was aborted. He also has a 60% circumflex.  Echocardiogram showed an EF of 40% with moderate to severe MR.  Though his LVEDP at cath was normal he was felt to be volume overloaded.    He was seeng Geneticist, molecular Dr.[name].   Interim Hx He notes that ~ 10 days ago. He can't walk across the house without being short of breath. His O2 sat is normal. He has mild chest pain. He has not taken SL nitro. He uses a couple pillows to sleep. For comfort purposes. He denies LE edema. No report of bleeding. He was seen 12/30 and spect was obtained.   Interim Hx: Mr Marcus [name] underwent LHC noted to have 99% proc RCA dx , proximal to his prior stent. He underwent Synergy PTCA/DES x1. His mid LAD showed known 100 % lesion, identified as reversible defect on his SPECT. He was noted to have distal filling faintly from antegrade flow and from R to L collaterals. He follows in Afib clinic for persistent atrial fibrillation/atrial flutter.  Plan is for him to continue plavix and eliquis. Discussed AAD and he wanted to hold off. He underwent DCCV 11/03/2021. He's in sinus rhythm today. He notes episode of high heart rates. His blood pressures  fluctuate. He notes still having SOB with activity. He denies orthopnea or PND. He has no LE edema.  Cardiology Studies:  10/13/2021 Prox RCA-1 lesion is 99% stenosed.   Ost Cx to Mid Cx lesion is 60% stenosed.   Mid LAD lesion is 100% stenosed.   Previously placed Prox RCA-2 stent (unknown type) is  widely patent.   A drug-eluting stent was successfully placed using a SYNERGY XD 3.0X16.   Post intervention, there is a 0% residual stenosis.   Large dominant RCA with severe proximal stenosis. The proximal stent is patent.  Successful PTCA/DES x 1 proximal RCA Moderate disease in the proximal and mid Circumflex Functional chronic total occlusion of the calcified mid LAD, unchanged from last cath in 2019. The distal LAD fills faintly from antegrade flow and from right to left collaterals.    Recommendations: Will continue ASA and Plavix tomorrow. If his Eliquis is restarted tomorrow, I think the ASA can be stopped and he can be continued on Plavix along with Eliquis. Monitor overnight given elevated BP before the procedure and bleeding risk.   TTE 10/11/2021 EF preserved with apical hypokinesis. Grade II DD. Borderline strain - 16.  Normal RV fxn. Has significant pulmonary htn 63  SPECT 10/05/2021  Findings are consistent with ischemia. The study is intermediate risk.   No ST deviation was noted.   LV perfusion  is abnormal. There is evidence of ischemia. There is no evidence of infarction. Defect 1: There is a medium defect with severe reduction in uptake present in the apical to mid anteroseptal location(s) that is reversible. Viability is present. There is abnormal wall motion in the defect area. Consistent with ischemia.   Left ventricular function is abnormal. Global function is mildly reduced. There was a single regional abnormality. Nuclear stress EF: 54 %. The left ventricular ejection fraction is mildly decreased (45-54%). End diastolic cavity size is normal.   Prior study not available  for comparison.   TTE 08/11/2022 Left ventricular ejection fraction, by estimation, is 55%. The left ventricle has normal function. The left ventricle demonstrates regional wall motion abnormalities with severe apical and apical septal hypokinesis. There is mild left ventricular hypertrophy. Left ventricular diastolic parameters are consistent with Grade II diastolic dysfunction (pseudonormalization). The average left ventricular global longitudinal strain is -16.8 %. The global longitudinal strain is abnormal. 1. Right ventricular systolic function is normal. The right ventricular size is mildly enlarged. There is severely elevated pulmonary artery systolic pressure. The estimated right ventricular systolic pressure is 90.3 mmHg. 2. 3. Left atrial size was moderately dilated. 4. Right atrial size was severely dilated. The mitral valve is abnormal. Mild to moderate mitral valve regurgitation. No evidence of mitral stenosis. Moderate mitral annular calcification. 5. 6. Tricuspid valve regurgitation is moderate to severe. The aortic valve is tricuspid. Aortic valve regurgitation is moderate. Mild aortic valve stenosis. Aortic valve mean gradient measures 14.0 mmHg. 7. Aortic dilatation noted. There is mild dilatation of the ascending aorta, measuring 40 mm. 8. The inferior vena cava is dilated in size with <50% respiratory variability, suggesting right atrial pressure of 15 mmHg.  Past Medical History:  Diagnosis Date   Acute on chronic combined systolic and diastolic CHF (congestive heart failure) ([place]) 08/22/2018   Acute on chronic respiratory failure ([place]) 04/15/2015   Anemia    Atrial flutter ([facility])    Bradycardia    CAD in native artery 08/22/2018   CAP (community acquired pneumonia) 04/13/2015   See cxr 04/13/2015 > admit [facility]     Complication of anesthesia    H/O hiatal hernia    Hyperlipidemia LDL goal <70 08/22/2018   Hypertension 08/22/2018   Hypothyroidism 04/13/2015    Myasthenia gravis ([place]) 04/13/2015   Obesity 04/13/2015   Paroxysmal A-fib ([place]) 04/16/2015   RBBB    S/P angioplasty with stent 08/21/18 DES to RCA 08/22/2018   Sepsis ([place]) 04/15/2015   Thrombocytopenia ([place])     Past Surgical History:  Procedure Laterality Date   AMPUTATION  06/11/2012   Procedure: AMPUTATION DIGIT;  Surgeon: [name], MD;  Location: [facility];  Service: Orthopedics;  Laterality: Left;  revision of amputation   CARDIOVERSION N/A 03/24/2020   Procedure: CARDIOVERSION;  Surgeon: [name], [name], MD;  Location: [facility] ENDOSCOPY;  Service: Cardiovascular;  Laterality: N/A;   CARDIOVERSION N/A 02/14/2021   Procedure: CARDIOVERSION;  Surgeon: [name], MD;  Location: [place];  Service: Cardiovascular;  Laterality: N/A;   CARDIOVERSION N/A 11/03/2021   Procedure: CARDIOVERSION;  Surgeon: Buford Dresser, MD;  Location[facility] ENDOSCOPY;  Service: Cardiovascular;  Laterality: N/A;   CORONARY STENT INTERVENTION N/A 08/21/2018   Procedure: CORONARY STENT INTERVENTION;  Surgeon: [name], MD;  Location: [facility] CV LAB;  Service: Cardiovascular;  Laterality: N/A;   CORONARY STENT INTERVENTION N/A 10/13/2021   Procedure: CORONARY STENT INTERVENTION;  Surgeon: Burnell Blanks, MD;  Location:  [facility] [facility] INVASIVE CV LAB;  Service: Cardiovascular;  Laterality: N/A;   HERNIA REPAIR  1994   LEFT HEART CATH AND CORONARY ANGIOGRAPHY N/A 08/21/2018   Procedure: LEFT HEART CATH AND CORONARY ANGIOGRAPHY;  Surgeon: [name], MD;  Location: [facility] CV LAB;  Service: Cardiovascular;  Laterality: N/A;   LEFT HEART CATH AND CORONARY ANGIOGRAPHY N/A 10/13/2021   Procedure: LEFT HEART CATH AND CORONARY ANGIOGRAPHY;  Surgeon: Burnell Blanks, MD;  Location: [facility] CV LAB;  Service: Cardiovascular;  Laterality: N/A;   ORIF SHOULDER FRACTURE  06/13/2012   Procedure: OPEN REDUCTION INTERNAL FIXATION (ORIF) SHOULDER FRACTURE;  Surgeon: [name], MD;  Location: [facility];  Service: Orthopedics;  Laterality: Left;  LEFT SHOULDER OPEN GREATER TUBEROSITY ORIF   SHOULDER CLOSED REDUCTION  06/11/2012   Procedure: CLOSED MANIPULATION SHOULDER;  Surgeon: [name], MD;  Location: [facility];  Service: Orthopedics;  Laterality: Left;    Current Medications: No outpatient medications have been marked as taking for the 11/10/21 encounter (Appointment) with Janina Mayo, MD.     Allergies:   Ace inhibitors, Beta adrenergic blockers, and Statins   Social History   Socioeconomic History   Marital status: Divorced    Spouse name: Not on file   Number of children: 7   Years of education: Not on file   Highest education level: Not on file  Occupational History   Not on file  Tobacco Use   Smoking status: Former    Packs/day: 1.00    Years: 30.00    Pack years: 30.00    Types: Cigarettes    Quit date: 01/10/1982    Years since quitting: 39.8   Smokeless tobacco: Never   Tobacco comments:    Former smoker 10/17/2021  Vaping Use   Vaping Use: Never used  Substance and Sexual Activity   Alcohol use: Not Currently    Alcohol/week: 2.0 standard drinks    Types: 2 Glasses of wine per week    Comment: No alcohol since 10/14/2021   Drug use: No   Sexual activity: Never  Other Topics Concern   Not on file  Social History Narrative   Lives alone in a two story home.  Divorced.  Has 7 children.     Retired from [employer].     Education: some college.   Right Handed   Social Determinants of Health   Financial Resource Strain: Not on file  Food Insecurity: Not on file  Transportation Needs: Not on file  Physical Activity: Not on file  Stress: Not on file  Social Connections: Not on file     Family History: The patient's family history includes Healthy in his daughter, sister, and son; Heart attack in his father; Other in his mother.  ROS:   Please see the history of present illness.     All other systems  reviewed and are negative.  EKGs/Labs/Other Studies Reviewed:    The following studies were reviewed today:   EKG:  EKG is  ordered today.  The ekg ordered today demonstrates   NSR, RBBB, LAFB  Sinus bradycardia, LAFB, RBBB.  Recent Labs: 10/14/2021: TSH 3.742 11/03/2021: BUN 34; Creatinine, Ser 1.11; Hemoglobin 12.3; Platelets 122; Potassium 5.2; Sodium 138  Recent Lipid Panel    Component Value Date/Time   CHOL 128 06/05/2019 0820   TRIG 109 06/05/2019 0820   HDL 41 06/05/2019 0820   CHOLHDL 3.1 06/05/2019 0820   CHOLHDL 2.8 08/21/2018 1660  VLDL 11 08/21/2018 0924   LDLCALC 67 06/05/2019 0820     Risk Assessment/Calculations:           Physical Exam:    VS:    Vitals:   11/10/21 1557  BP: 130/76  Pulse: (!) 58  Resp: 20  SpO2: 98%       Wt Readings from Last 3 Encounters:  11/03/21 166 lb (75.3 kg)  10/17/21 163 lb 9.6 oz (74.2 kg)  10/13/21 164 lb (74.4 kg)     GEN:  well appearing HEENT: Normal NECK: No JVD LYMPHATICS: No lymphadenopathy CARDIAC: RRR, no murmurs, rubs, gallops RESPIRATORY:  Clear to auscultation without rales, wheezing or rhonchi  ABDOMEN:non-distended MUSCULOSKELETAL:  No edema;  left thumb amputation SKIN: Warm and dry,  NEUROLOGIC:  Alert and oriented x 3 PSYCHIATRIC:  Normal affect   ASSESSMENT:    #Ischemic Heart disease:LHC per above s/p recent prox RCA PCI. He will continue plavix and eliquis. His symptoms have not improved , ddx includes increased LVEDP and/or deconditioning. On exam, he is not volume overloaded. His LAD lesion had faint antegrade flow and R collaterals, will continue to medically management. -continue plavix -not on asa 2/2 xarelto - not on BB or ACE I due to intolerance   #HfpEF: Group II. He has significant pulmonary htn. RVSP 63 mmHg. He was evaluated by pulmonology in March 2022 without evidence of pulmonary disease. His RV is only mildly enlarged with preserved RV function.  Can consider sleep  study on return visit. He is not volume overloaded today --continue lasix 20 mg daily (can trial daily ) - started spironolactone  - dry weight 155  #Atrial Fibrillation: on xarelto. Discussed AAD in afib clinic and he would like to avoid this. He underwent successful DCCV 10/17/2021. He is in sinus rhythm. Not on BB 2/2 intolerance per above.  #HTN- Fluctuate. SBPs range from 120s to 160s. Will start spironolactone. He can continue norvasc 10 mg daily  #HLD- noted hives with statin therapy. Don't feel the need to be aggressive considering age.    PLAN:    In order of problems listed above:  Start spironolactone 12.5 mg daily Lasix 20 mg daily [can scale back if having issues with persistent urination] BMET 2 weeks    Cardiac Rehabilitation Eligibility Assessment       Shared Decision Making/Informed Consent The risks [stroke (1 in 1000), death (1 in 60), kidney failure [usually temporary] (1 in 500), bleeding (1 in 200), allergic reaction [possibly serious] (1 in 200)], benefits (diagnostic support and management of coronary artery disease) and alternatives of a cardiac catheterization were discussed in detail with Mr. Moure and he is willing to proceed.    Medication Adjustments/Labs and Tests Ordered: Current medicines are reviewed at length with the patient today.  Concerns regarding medicines are outlined above.  No orders of the defined types were placed in this encounter.  No orders of the defined types were placed in this encounter.   There are no Patient Instructions on file for this visit.   Signed, Janina Mayo, MD  11/10/2021 3:40 PM    [facility]

## 2021-11-13 ENCOUNTER — Encounter (HOSPITAL_COMMUNITY): Payer: Self-pay | Admitting: Physician Assistant

## 2021-11-13 ENCOUNTER — Ambulatory Visit: Payer: Medicare Other | Admitting: Physician Assistant

## 2021-11-13 ENCOUNTER — Other Ambulatory Visit: Payer: Self-pay

## 2021-11-13 ENCOUNTER — Ambulatory Visit (HOSPITAL_COMMUNITY)
Admission: RE | Admit: 2021-11-13 | Discharge: 2021-11-13 | Disposition: A | Discharge status: Home / Self Care | Payer: Medicare Other | Source: Ambulatory Visit | Attending: Physician Assistant | Admitting: Physician Assistant

## 2021-11-13 ENCOUNTER — Other Ambulatory Visit (HOSPITAL_COMMUNITY): Payer: Self-pay

## 2021-11-13 VITALS — BP 150/70 | HR 54 | Ht 68.0 in | Wt 161.2 lb

## 2021-11-13 DIAGNOSIS — I440 Atrioventricular block, first degree: Secondary | ICD-10-CM | POA: Diagnosis not present

## 2021-11-13 DIAGNOSIS — Z79899 Other long term (current) drug therapy: Secondary | ICD-10-CM | POA: Insufficient documentation

## 2021-11-13 DIAGNOSIS — I110 Hypertensive heart disease with heart failure: Secondary | ICD-10-CM | POA: Diagnosis not present

## 2021-11-13 DIAGNOSIS — I484 Atypical atrial flutter: Secondary | ICD-10-CM | POA: Diagnosis not present

## 2021-11-13 DIAGNOSIS — I2582 Chronic total occlusion of coronary artery: Secondary | ICD-10-CM | POA: Diagnosis not present

## 2021-11-13 DIAGNOSIS — Z7902 Long term (current) use of antithrombotics/antiplatelets: Secondary | ICD-10-CM | POA: Diagnosis not present

## 2021-11-13 DIAGNOSIS — R001 Bradycardia, unspecified: Secondary | ICD-10-CM | POA: Insufficient documentation

## 2021-11-13 DIAGNOSIS — E785 Hyperlipidemia, unspecified: Secondary | ICD-10-CM | POA: Insufficient documentation

## 2021-11-13 DIAGNOSIS — I5032 Chronic diastolic (congestive) heart failure: Secondary | ICD-10-CM | POA: Diagnosis not present

## 2021-11-13 DIAGNOSIS — Z7901 Long term (current) use of anticoagulants: Secondary | ICD-10-CM | POA: Diagnosis not present

## 2021-11-13 DIAGNOSIS — I452 Bifascicular block: Secondary | ICD-10-CM | POA: Diagnosis not present

## 2021-11-13 DIAGNOSIS — D6869 Other thrombophilia: Secondary | ICD-10-CM

## 2021-11-13 DIAGNOSIS — I4819 Other persistent atrial fibrillation: Secondary | ICD-10-CM | POA: Insufficient documentation

## 2021-11-13 DIAGNOSIS — I444 Left anterior fascicular block: Secondary | ICD-10-CM | POA: Insufficient documentation

## 2021-11-13 DIAGNOSIS — I4510 Unspecified right bundle-branch block: Secondary | ICD-10-CM | POA: Insufficient documentation

## 2021-11-13 DIAGNOSIS — I4892 Unspecified atrial flutter: Secondary | ICD-10-CM | POA: Diagnosis not present

## 2021-11-13 DIAGNOSIS — Z955 Presence of coronary angioplasty implant and graft: Secondary | ICD-10-CM | POA: Insufficient documentation

## 2021-11-13 DIAGNOSIS — I083 Combined rheumatic disorders of mitral, aortic and tricuspid valves: Secondary | ICD-10-CM | POA: Insufficient documentation

## 2021-11-13 DIAGNOSIS — I2510 Atherosclerotic heart disease of native coronary artery without angina pectoris: Secondary | ICD-10-CM | POA: Diagnosis not present

## 2021-11-13 DIAGNOSIS — E039 Hypothyroidism, unspecified: Secondary | ICD-10-CM | POA: Diagnosis not present

## 2021-11-13 LAB — COMPREHENSIVE METABOLIC PANEL
ALT: 23 U/L (ref 0–44)
AST: 22 U/L (ref 15–41)
Albumin: 3.8 g/dL (ref 3.5–5.0)
Alkaline Phosphatase: 83 U/L (ref 38–126)
Anion gap: 8 (ref 5–15)
BUN: 34 mg/dL — ABNORMAL HIGH (ref 8–23)
CO2: 22 mmol/L (ref 22–32)
Calcium: 8.8 mg/dL — ABNORMAL LOW (ref 8.9–10.3)
Chloride: 108 mmol/L (ref 98–111)
Creatinine, Ser: 1.19 mg/dL (ref 0.61–1.24)
GFR, Estimated: 59 mL/min — ABNORMAL LOW (ref >60)
Glucose, Bld: 127 mg/dL — ABNORMAL HIGH (ref 70–99)
Potassium: 4.7 mmol/L (ref 3.5–5.1)
Sodium: 138 mmol/L (ref 135–145)
Total Bilirubin: 0.4 mg/dL (ref 0.3–1.2)
Total Protein: 6.8 g/dL (ref 6.5–8.1)

## 2021-11-13 MED ORDER — AMIODARONE HCL 200 MG PO TABS: ORAL_TABLET | ORAL | 0 refills | Status: DC

## 2021-11-13 NOTE — Patient Instructions (Signed)
Start Amiodarone 200mg  -- take 1 tablet twice a day (WITH FOOD) for the next month then we will reduce it to 1 tablet daily.

## 2021-11-13 NOTE — Progress Notes (Signed)
Primary Care Physician: [name], MD Primary Cardiologist: Dr Phineas Inches Primary Electrophysiologist: none Referring Physician: Jory Sims NP   Marcus [name] Sr. is a 88 y.o. male with a history of CHF, HTN, CAD, HLD, hypothyroidism, atrial fibrillation who presents for follow up in the [facility]. Patient is on Xarelto for a CHADS2VASC score of 5. He was seen 10/11/21 for unstable angina and had [facility] 10/13/21 which showed 99% RCA lesion treated with a DES, chronic total occlusion of LAD. Additionally, patient was found to be in rate controlled atrial flutter at his visit 09/22/21. He reports that he does not feel much better after his stent was placed.   On follow up today, patient is s/p DCCV on 11/03/21. He was in SR at his visit with Dr Harl Bowie 11/10/21. He has gone back into atrial flutter in the interim. He states that he felt minimally improved in SR, he continues to have fatigue, dyspnea on exertion, and dizziness.   Today, he denies symptoms of palpitations, chest pain, orthopnea, PND, lower extremity edema, presyncope, syncope, snoring, daytime somnolence, bleeding, or neurologic sequela. The patient is tolerating medications without difficulties and is otherwise without complaint today.    Atrial Fibrillation Risk Factors:  he does not have symptoms or diagnosis of sleep apnea. he does not have a history of rheumatic fever.  he has a BMI of Body mass index is 24.51 kg/m.Marland Kitchen Filed Weights   11/13/21 1435  Weight: 73.1 kg     Family History  Problem Relation Age of Onset   Other Mother        Deceased, 32   Heart attack Father        Deceased, 25   Healthy Sister    Healthy Daughter        x 4   Healthy Son        x 3     Atrial Fibrillation Management history:  Previous antiarrhythmic drugs: none Previous cardioversions: 03/24/20, 02/14/21, 11/03/21 Previous ablations: none CHADS2VASC score: 5 Anticoagulation history:  Xarelto    Past Medical History:  Diagnosis Date   Acute on chronic combined systolic and diastolic CHF (congestive heart failure) ([place]) 08/22/2018   Acute on chronic respiratory failure ([place]) 04/15/2015   Anemia    Atrial flutter (HCC)    Bradycardia    CAD in native artery 08/22/2018   CAP (community acquired pneumonia) 04/13/2015   See cxr 04/13/2015 > admit [facility]     Complication of anesthesia    H/O hiatal hernia    Hyperlipidemia LDL goal <70 08/22/2018   Hypertension 08/22/2018   Hypothyroidism 04/13/2015   Myasthenia gravis ([place]) 04/13/2015   Obesity 04/13/2015   Paroxysmal A-fib ([place]) 04/16/2015   RBBB    S/P angioplasty with stent 08/21/18 DES to RCA 08/22/2018   Sepsis ([place]) 04/15/2015   Thrombocytopenia ([place])    Past Surgical History:  Procedure Laterality Date   AMPUTATION  06/11/2012   Procedure: AMPUTATION DIGIT;  Surgeon: [name], MD;  Location: [place];  Service: Orthopedics;  Laterality: Left;  revision of amputation   CARDIOVERSION N/A 03/24/2020   Procedure: CARDIOVERSION;  Surgeon: [name], [name], MD;  Location: [facility] ENDOSCOPY;  Service: Cardiovascular;  Laterality: N/A;   CARDIOVERSION N/A 02/14/2021   Procedure: CARDIOVERSION;  Surgeon: [name], MD;  Location: [place];  Service: Cardiovascular;  Laterality: N/A;   CARDIOVERSION N/A 11/03/2021   Procedure: CARDIOVERSION;  Surgeon: Buford Dresser, MD;  Location: [facility]  ENDOSCOPY;  Service: Cardiovascular;  Laterality: N/A;   CORONARY STENT INTERVENTION N/A 08/21/2018   Procedure: CORONARY STENT INTERVENTION;  Surgeon: [name], MD;  Location: [place] CV LAB;  Service: Cardiovascular;  Laterality: N/A;   CORONARY STENT INTERVENTION N/A 10/13/2021   Procedure: CORONARY STENT INTERVENTION;  Surgeon: Burnell Blanks, MD;  Location: [place] CV LAB;  Service: Cardiovascular;  Laterality: N/A;   HERNIA REPAIR  1994   LEFT HEART CATH AND CORONARY ANGIOGRAPHY N/A  08/21/2018   Procedure: LEFT HEART CATH AND CORONARY ANGIOGRAPHY;  Surgeon: [name], MD;  Location: [place] CV LAB;  Service: Cardiovascular;  Laterality: N/A;   LEFT HEART CATH AND CORONARY ANGIOGRAPHY N/A 10/13/2021   Procedure: LEFT HEART CATH AND CORONARY ANGIOGRAPHY;  Surgeon: Burnell Blanks, MD;  Location: [place] CV LAB;  Service: Cardiovascular;  Laterality: N/A;   ORIF SHOULDER FRACTURE  06/13/2012   Procedure: OPEN REDUCTION INTERNAL FIXATION (ORIF) SHOULDER FRACTURE;  Surgeon: [name], MD;  Location: [place];  Service: Orthopedics;  Laterality: Left;  LEFT SHOULDER OPEN GREATER TUBEROSITY ORIF   SHOULDER CLOSED REDUCTION  06/11/2012   Procedure: CLOSED MANIPULATION SHOULDER;  Surgeon: [name], MD;  Location: [place];  Service: Orthopedics;  Laterality: Left;    Current Outpatient Medications  Medication Sig Dispense Refill   amLODipine (NORVASC) 10 MG tablet Take 1 tablet (10 mg total) by mouth daily. 90 tablet 1   Carboxymethylcellulose Sodium (THERATEARS) 0.25 % SOLN Place 1-2 drops into both eyes 3 (three) times daily as needed (dry/irritated eyes.).     Cholecalciferol (DIALYVITE VITAMIN D 5000) 125 MCG (5000 UT) capsule Take 5,000 Units by mouth daily.     clopidogrel (PLAVIX) 75 MG tablet Take 1 tablet (75 mg total) by mouth daily. 90 tablet 1   furosemide (LASIX) 20 MG tablet Take 1 tablet (20 mg total) by mouth daily. 90 tablet 3   levothyroxine (SYNTHROID) 137 MCG tablet Take 137 mcg by mouth daily before breakfast.     Magnesium 400 MG TABS Take 400 mg by mouth daily.     nitroGLYCERIN (NITROSTAT) 0.4 MG SL tablet Place 1 tablet (0.4 mg total) under the tongue every 5 (five) minutes as needed for chest pain. 30 tablet 6   Rivaroxaban (XARELTO) 15 MG TABS tablet TAKE 1 TABLET(15 MG) BY MOUTH DAILY WITH SUPPER 90 tablet 1   spironolactone (ALDACTONE) 25 MG tablet Take 0.5 tablets (12.5 mg total) by mouth daily. 45 tablet 3   amiodarone  (PACERONE) 200 MG tablet Take 1 tablet by mouth twice a day for one month then reduce to once a day 180 tablet 0   No current facility-administered medications for this encounter.    Allergies  Allergen Reactions   Ace Inhibitors Hives   Beta Adrenergic Blockers Hives   Statins Hives    Social History   Socioeconomic History   Marital status: Divorced    Spouse name: Not on file   Number of children: 7   Years of education: Not on file   Highest education level: Not on file  Occupational History   Not on file  Tobacco Use   Smoking status: Former    Packs/day: 1.00    Years: 30.00    Pack years: 30.00    Types: Cigarettes    Quit date: 01/10/1982    Years since quitting: 39.8   Smokeless tobacco: Never   Tobacco comments:    Former smoker 10/17/2021  Vaping  Use   Vaping Use: Never used  Substance and Sexual Activity   Alcohol use: Not Currently    Alcohol/week: 2.0 standard drinks    Types: 2 Glasses of wine per week    Comment: No alcohol since 10/14/2021   Drug use: No   Sexual activity: Never  Other Topics Concern   Not on file  Social History Narrative   Lives alone in a two story home.  Divorced.  Has 7 children.     Retired from [employer].     Education: some college.   Right Handed   Social Determinants of Health   Financial Resource Strain: Not on file  Food Insecurity: Not on file  Transportation Needs: Not on file  Physical Activity: Not on file  Stress: Not on file  Social Connections: Not on file  Intimate Partner Violence: Not on file     ROS- All systems are reviewed and negative except as per the HPI above.  Physical Exam: Vitals:   11/13/21 1435  BP: (!) 150/70  Pulse: (!) 54  SpO2: 98%  Weight: 73.1 kg  Height: 5\' 8"  (1.727 m)    GEN- The patient is a well appearing elderly male, alert and oriented x 3 today.   HEENT-head normocephalic, atraumatic, sclera clear, conjunctiva pink, hearing intact, trachea  midline. Lungs- Clear to ausculation bilaterally, normal work of breathing Heart- irregular rate and rhythm, no murmurs, rubs or gallops  GI- soft, NT, ND, + BS Extremities- no clubbing, cyanosis, or edema MS- no significant deformity or atrophy Skin- no rash or lesion Psych- euthymic mood, full affect Neuro- strength and sensation are intact   Wt Readings from Last 3 Encounters:  11/13/21 73.1 kg  11/10/21 70.6 kg  11/03/21 75.3 kg    EKG today demonstrates  Atypical atrial flutter  Vent. rate 54 BPM PR interval 584 ms QRS duration 152 ms QT/QTcB 496/470 ms  Echo 10/11/21 demonstrated   1. Left ventricular ejection fraction, by estimation, is 55%. The left  ventricle has normal function. The left ventricle demonstrates regional  wall motion abnormalities with severe apical and apical septal  hypokinesis. There is mild left ventricular hypertrophy. Left ventricular diastolic parameters are consistent with Grade II diastolic dysfunction (pseudonormalization). The average left ventricular global longitudinal strain is -16.8 %. The global longitudinal strain is abnormal.   2. Right ventricular systolic function is normal. The right ventricular  size is mildly enlarged. There is severely elevated pulmonary artery  systolic pressure. The estimated right ventricular systolic pressure is  16.1 mmHg.   3. Left atrial size was moderately dilated.   4. Right atrial size was severely dilated.   5. The mitral valve is abnormal. Mild to moderate mitral valve  regurgitation. No evidence of mitral stenosis. Moderate mitral annular  calcification.   6. Tricuspid valve regurgitation is moderate to severe.   7. The aortic valve is tricuspid. Aortic valve regurgitation is moderate.  Mild aortic valve stenosis. Aortic valve mean gradient measures 14.0 mmHg.   8. Aortic dilatation noted. There is mild dilatation of the ascending  aorta, measuring 40 mm.   9. The inferior vena cava is dilated in  size with <50% respiratory  variability, suggesting right atrial pressure of 15 mmHg.   Comparison(s): 02/13/21 EF 60-65%. PA pressure 67mmHg. Mild AS 38mmHg mean PG, 52mmHg peak PG.   Epic records are reviewed at length today  CHA2DS2-VASc Score = 5  The patient's score is based upon: CHF History: 1 HTN  History: 1 Diabetes History: 0 Stroke History: 0 Vascular Disease History: 1 Age Score: 2 Gender Score: 0       ASSESSMENT AND PLAN: 1. Persistent Atrial Fibrillation/atrial flutter The patient's CHA2DS2-VASc score is 5, indicating a 7.2% annual risk of stroke.   S/p DCCV 11/03/21 with early return of afib We discussed rhythm control options including amiodarone and dofetilide. Patient is clear in his decision to avoid the hospital as much as possible.  Will start amiodarone 200 mg BID. If he does not convert chemically, will need repeat DCCV.  Check cmet, recent TSH reviewed.  Continue Xarelto 15 mg daily  2. Secondary Hypercoagulable State (ICD10:  D68.69) The patient is at significant risk for stroke/thromboembolism based upon his CHA2DS2-VASc Score of 5.  Continue Rivaroxaban (Xarelto).   3. HTN Stable, no changes today.  4. CAD DES 10/13/21 Currently on Plavix Has not tolerated BB or statin in the past. No anginal symptoms.  5. Chronic diastolic CHF No signs or symptoms of fluid overload.  6. Valvular heart disease Mild-mod MR Mod AR, mild AS Mod-severe TR   Follow up in the AF clinic in 2 weeks.    [facility] [address] [place], [place] [address] [phone] 11/13/2021 4:45 PM

## 2021-11-21 DIAGNOSIS — I2510 Atherosclerotic heart disease of native coronary artery without angina pectoris: Secondary | ICD-10-CM | POA: Diagnosis not present

## 2021-11-21 LAB — BASIC METABOLIC PANEL
BUN/Creatinine Ratio: 24 (ref 10–24)
BUN: 29 mg/dL — ABNORMAL HIGH (ref 8–27)
CO2: 22 mmol/L (ref 20–29)
Calcium: 8.9 mg/dL (ref 8.6–10.2)
Chloride: 105 mmol/L (ref 96–106)
Creatinine, Ser: 1.2 mg/dL (ref 0.76–1.27)
Glucose: 84 mg/dL (ref 70–99)
Potassium: 4.7 mmol/L (ref 3.5–5.2)
Sodium: 140 mmol/L (ref 134–144)
eGFR: 59 mL/min/{1.73_m2} — ABNORMAL LOW (ref >59)

## 2021-11-23 ENCOUNTER — Other Ambulatory Visit: Payer: Self-pay

## 2021-11-23 ENCOUNTER — Ambulatory Visit (INDEPENDENT_AMBULATORY_CARE_PROVIDER_SITE_OTHER): Payer: Medicare Other

## 2021-11-23 DIAGNOSIS — E538 Deficiency of other specified B group vitamins: Secondary | ICD-10-CM

## 2021-11-23 DIAGNOSIS — I480 Paroxysmal atrial fibrillation: Secondary | ICD-10-CM

## 2021-11-23 MED ORDER — CYANOCOBALAMIN 1000 MCG/ML IJ SOLN
1000.0000 ug | Freq: Once | INTRAMUSCULAR | Status: AC
  Administered 2021-11-23: 1000 ug via INTRAMUSCULAR

## 2021-11-24 DIAGNOSIS — H0102B Squamous blepharitis left eye, upper and lower eyelids: Secondary | ICD-10-CM | POA: Diagnosis not present

## 2021-11-24 DIAGNOSIS — H0102A Squamous blepharitis right eye, upper and lower eyelids: Secondary | ICD-10-CM | POA: Diagnosis not present

## 2021-11-24 DIAGNOSIS — Z961 Presence of intraocular lens: Secondary | ICD-10-CM | POA: Diagnosis not present

## 2021-11-24 DIAGNOSIS — H02134 Senile ectropion of left upper eyelid: Secondary | ICD-10-CM | POA: Diagnosis not present

## 2021-11-24 DIAGNOSIS — Z947 Corneal transplant status: Secondary | ICD-10-CM | POA: Diagnosis not present

## 2021-11-24 DIAGNOSIS — H02131 Senile ectropion of right upper eyelid: Secondary | ICD-10-CM | POA: Diagnosis not present

## 2021-11-24 DIAGNOSIS — H26491 Other secondary cataract, right eye: Secondary | ICD-10-CM | POA: Diagnosis not present

## 2021-11-28 ENCOUNTER — Encounter: Payer: Self-pay | Admitting: Internal Medicine

## 2021-11-28 DIAGNOSIS — I10 Essential (primary) hypertension: Secondary | ICD-10-CM | POA: Diagnosis not present

## 2021-11-28 DIAGNOSIS — I2510 Atherosclerotic heart disease of native coronary artery without angina pectoris: Secondary | ICD-10-CM | POA: Diagnosis not present

## 2021-11-28 DIAGNOSIS — E785 Hyperlipidemia, unspecified: Secondary | ICD-10-CM | POA: Diagnosis not present

## 2021-11-28 DIAGNOSIS — E039 Hypothyroidism, unspecified: Secondary | ICD-10-CM | POA: Diagnosis not present

## 2021-11-30 ENCOUNTER — Encounter (HOSPITAL_COMMUNITY): Payer: Self-pay | Admitting: Physician Assistant

## 2021-11-30 ENCOUNTER — Other Ambulatory Visit: Payer: Self-pay

## 2021-11-30 ENCOUNTER — Ambulatory Visit (HOSPITAL_COMMUNITY)
Admission: RE | Admit: 2021-11-30 | Discharge: 2021-11-30 | Disposition: A | Discharge status: Home / Self Care | Payer: Medicare Other | Source: Ambulatory Visit | Attending: Physician Assistant | Admitting: Physician Assistant

## 2021-11-30 VITALS — BP 146/64 | HR 51 | Ht 68.0 in | Wt 157.0 lb

## 2021-11-30 DIAGNOSIS — Z955 Presence of coronary angioplasty implant and graft: Secondary | ICD-10-CM | POA: Insufficient documentation

## 2021-11-30 DIAGNOSIS — I4819 Other persistent atrial fibrillation: Secondary | ICD-10-CM | POA: Diagnosis not present

## 2021-11-30 DIAGNOSIS — Z7901 Long term (current) use of anticoagulants: Secondary | ICD-10-CM | POA: Insufficient documentation

## 2021-11-30 DIAGNOSIS — I110 Hypertensive heart disease with heart failure: Secondary | ICD-10-CM | POA: Insufficient documentation

## 2021-11-30 DIAGNOSIS — I4892 Unspecified atrial flutter: Secondary | ICD-10-CM | POA: Insufficient documentation

## 2021-11-30 DIAGNOSIS — Z7902 Long term (current) use of antithrombotics/antiplatelets: Secondary | ICD-10-CM | POA: Insufficient documentation

## 2021-11-30 DIAGNOSIS — E0590 Thyrotoxicosis, unspecified without thyrotoxic crisis or storm: Secondary | ICD-10-CM | POA: Diagnosis not present

## 2021-11-30 DIAGNOSIS — I2510 Atherosclerotic heart disease of native coronary artery without angina pectoris: Secondary | ICD-10-CM | POA: Insufficient documentation

## 2021-11-30 DIAGNOSIS — D6869 Other thrombophilia: Secondary | ICD-10-CM | POA: Insufficient documentation

## 2021-11-30 DIAGNOSIS — I083 Combined rheumatic disorders of mitral, aortic and tricuspid valves: Secondary | ICD-10-CM | POA: Diagnosis not present

## 2021-11-30 DIAGNOSIS — I5032 Chronic diastolic (congestive) heart failure: Secondary | ICD-10-CM | POA: Diagnosis not present

## 2021-11-30 DIAGNOSIS — E785 Hyperlipidemia, unspecified: Secondary | ICD-10-CM | POA: Diagnosis not present

## 2021-11-30 NOTE — Progress Notes (Signed)
Primary Care Physician: [name], MD Primary Cardiologist: Dr Phineas Inches Primary Electrophysiologist: none Referring Physician: Jory Sims NP   Marcus Kaufmann Sr. is a 88 y.o. male with a history of CHF, HTN, CAD, HLD, hypothyroidism, atrial fibrillation who presents for follow up in the [facility]. Patient is on Xarelto for a CHADS2VASC score of 5. He was seen 10/11/21 for unstable angina and had [facility] 10/13/21 which showed 99% RCA lesion treated with a DES, chronic total occlusion of LAD. Additionally, patient was found to be in rate controlled atrial flutter at his visit 09/22/21. He reports that he does not feel much better after his stent was placed.   Patient is s/p DCCV on 11/03/21. He was in SR at his visit with Dr Harl Bowie 11/10/21 but was back in atrial flutter by 11/13/21. He was prescribed amiodarone but he never started the medication due to concern about off target effects.   On follow up today, patient reports that he has done reasonably well since his last visit. He remains in rate controlled atrial flutter. No bleeding issue on anticoagulation.   Today, he denies symptoms of palpitations, chest pain, orthopnea, PND, lower extremity edema, presyncope, syncope, snoring, daytime somnolence, bleeding, or neurologic sequela. The patient is tolerating medications without difficulties and is otherwise without complaint today.    Atrial Fibrillation Risk Factors:  he does not have symptoms or diagnosis of sleep apnea. he does not have a history of rheumatic fever.  he has a BMI of Body mass index is 23.87 kg/m.Marland Kitchen Filed Weights   11/30/21 1128  Weight: 71.2 kg      Family History  Problem Relation Age of Onset   Other Mother        Deceased, 43   Heart attack Father        Deceased, 63   Healthy Sister    Healthy Daughter        x 4   Healthy Son        x 3     Atrial Fibrillation Management history:  Previous antiarrhythmic  drugs: none Previous cardioversions: 03/24/20, 02/14/21, 11/03/21 Previous ablations: none CHADS2VASC score: 5 Anticoagulation history: Xarelto    Past Medical History:  Diagnosis Date   Acute on chronic combined systolic and diastolic CHF (congestive heart failure) ([place]) 08/22/2018   Acute on chronic respiratory failure ([place]) 04/15/2015   Anemia    Atrial flutter (HCC)    Bradycardia    CAD in native artery 08/22/2018   CAP (community acquired pneumonia) 04/13/2015   See cxr 04/13/2015 > admit [facility]     Complication of anesthesia    H/O hiatal hernia    Hyperlipidemia LDL goal <70 08/22/2018   Hypertension 08/22/2018   Hypothyroidism 04/13/2015   Myasthenia gravis ([place]) 04/13/2015   Obesity 04/13/2015   Paroxysmal A-fib ([place]) 04/16/2015   RBBB    S/P angioplasty with stent 08/21/18 DES to RCA 08/22/2018   Sepsis ([place]) 04/15/2015   Thrombocytopenia ([place])    Past Surgical History:  Procedure Laterality Date   AMPUTATION  06/11/2012   Procedure: AMPUTATION DIGIT;  Surgeon: [name], MD;  Location: [place];  Service: Orthopedics;  Laterality: Left;  revision of amputation   CARDIOVERSION N/A 03/24/2020   Procedure: CARDIOVERSION;  Surgeon: [name] [name], MD;  Location: [place];  Service: Cardiovascular;  Laterality: N/A;   CARDIOVERSION N/A 02/14/2021   Procedure: CARDIOVERSION;  Surgeon: [name], MD;  Location: [place];  Service: Cardiovascular;  Laterality: N/A;   CARDIOVERSION N/A 11/03/2021   Procedure: CARDIOVERSION;  Surgeon: Buford Dresser, MD;  Location: [facility] ENDOSCOPY;  Service: Cardiovascular;  Laterality: N/A;   CORONARY STENT INTERVENTION N/A 08/21/2018   Procedure: CORONARY STENT INTERVENTION;  Surgeon: [name], MD;  Location: [place] CV LAB;  Service: Cardiovascular;  Laterality: N/A;   CORONARY STENT INTERVENTION N/A 10/13/2021   Procedure: CORONARY STENT INTERVENTION;  Surgeon: Burnell Blanks, MD;  Location: [place] CV LAB;  Service: Cardiovascular;  Laterality: N/A;   HERNIA REPAIR  1994   LEFT HEART CATH AND CORONARY ANGIOGRAPHY N/A 08/21/2018   Procedure: LEFT HEART CATH AND CORONARY ANGIOGRAPHY;  Surgeon: [name], MD;  Location: [place] CV LAB;  Service: Cardiovascular;  Laterality: N/A;   LEFT HEART CATH AND CORONARY ANGIOGRAPHY N/A 10/13/2021   Procedure: LEFT HEART CATH AND CORONARY ANGIOGRAPHY;  Surgeon: Burnell Blanks, MD;  Location: [place] CV LAB;  Service: Cardiovascular;  Laterality: N/A;   ORIF SHOULDER FRACTURE  06/13/2012   Procedure: OPEN REDUCTION INTERNAL FIXATION (ORIF) SHOULDER FRACTURE;  Surgeon: [name], MD;  Location: [place];  Service: Orthopedics;  Laterality: Left;  LEFT SHOULDER OPEN GREATER TUBEROSITY ORIF   SHOULDER CLOSED REDUCTION  06/11/2012   Procedure: CLOSED MANIPULATION SHOULDER;  Surgeon: [name], MD;  Location: [place];  Service: Orthopedics;  Laterality: Left;    Current Outpatient Medications  Medication Sig Dispense Refill   amLODipine (NORVASC) 10 MG tablet Take 1 tablet (10 mg total) by mouth daily. 90 tablet 1   Carboxymethylcellulose Sodium (THERATEARS) 0.25 % SOLN Place 1-2 drops into both eyes 3 (three) times daily as needed (dry/irritated eyes.).     Cholecalciferol (DIALYVITE VITAMIN D 5000) 125 MCG (5000 UT) capsule Take 5,000 Units by mouth daily.     clopidogrel (PLAVIX) 75 MG tablet Take 1 tablet (75 mg total) by mouth daily. 90 tablet 1   furosemide (LASIX) 20 MG tablet Take 1 tablet (20 mg total) by mouth daily. 90 tablet 3   levothyroxine (SYNTHROID) 137 MCG tablet Take 137 mcg by mouth daily before breakfast.     Magnesium 400 MG TABS Take 400 mg by mouth daily.     nitroGLYCERIN (NITROSTAT) 0.4 MG SL tablet Place 1 tablet (0.4 mg total) under the tongue every 5 (five) minutes as needed for chest pain. 30 tablet 6   Rivaroxaban (XARELTO) 15 MG TABS tablet TAKE 1 TABLET(15 MG) BY MOUTH DAILY WITH SUPPER 90  tablet 1   spironolactone (ALDACTONE) 25 MG tablet Take 0.5 tablets (12.5 mg total) by mouth daily. 45 tablet 3   testosterone cypionate (DEPOTESTOSTERONE CYPIONATE) 200 MG/ML injection Inject 200 mg into the muscle once a week.     No current facility-administered medications for this encounter.    Allergies  Allergen Reactions   Ace Inhibitors Hives   Beta Adrenergic Blockers Hives   Statins Hives    Social History   Socioeconomic History   Marital status: Divorced    Spouse name: Not on file   Number of children: 7   Years of education: Not on file   Highest education level: Not on file  Occupational History   Not on file  Tobacco Use   Smoking status: Former    Packs/day: 1.00    Years: 30.00    Pack years: 30.00    Types: Cigarettes    Quit date: 01/10/1982    Years since quitting: 24.9  Smokeless tobacco: Never   Tobacco comments:    Former smoker 10/17/2021  Vaping Use   Vaping Use: Never used  Substance and Sexual Activity   Alcohol use: Not Currently    Alcohol/week: 2.0 standard drinks    Types: 2 Glasses of wine per week    Comment: No alcohol since 10/14/2021   Drug use: No   Sexual activity: Never  Other Topics Concern   Not on file  Social History Narrative   Lives alone in a two story home.  Divorced.  Has 7 children.     Retired from [employer].     Education: some college.   Right Handed   Social Determinants of Health   Financial Resource Strain: Not on file  Food Insecurity: Not on file  Transportation Needs: Not on file  Physical Activity: Not on file  Stress: Not on file  Social Connections: Not on file  Intimate Partner Violence: Not on file     ROS- All systems are reviewed and negative except as per the HPI above.  Physical Exam: Vitals:   11/30/21 1128  BP: (!) 146/64  Pulse: (!) 51  Weight: 71.2 kg  Height: 5\' 8"  (1.727 m)    GEN- The patient is a well appearing elderly male, alert and oriented x 3 today.    HEENT-head normocephalic, atraumatic, sclera clear, conjunctiva pink, hearing intact, trachea midline. Lungs- Clear to ausculation bilaterally, normal work of breathing Heart- irregular rate and rhythm, no murmurs, rubs or gallops  GI- soft, NT, ND, + BS Extremities- no clubbing, cyanosis, or edema MS- no significant deformity or atrophy Skin- no rash or lesion Psych- euthymic mood, full affect Neuro- strength and sensation are intact   Wt Readings from Last 3 Encounters:  11/30/21 71.2 kg  11/13/21 73.1 kg  11/10/21 70.6 kg    EKG today demonstrates  Atypical atrial flutter with variable block, RBBB Vent. rate 51 BPM PR interval * ms QRS duration 154 ms QT/QTcB 498/458 ms  Echo 10/11/21 demonstrated   1. Left ventricular ejection fraction, by estimation, is 55%. The left  ventricle has normal function. The left ventricle demonstrates regional  wall motion abnormalities with severe apical and apical septal  hypokinesis. There is mild left ventricular hypertrophy. Left ventricular diastolic parameters are consistent with Grade II diastolic dysfunction (pseudonormalization). The average left ventricular global longitudinal strain is -16.8 %. The global longitudinal strain is abnormal.   2. Right ventricular systolic function is normal. The right ventricular  size is mildly enlarged. There is severely elevated pulmonary artery  systolic pressure. The estimated right ventricular systolic pressure is  09.6 mmHg.   3. Left atrial size was moderately dilated.   4. Right atrial size was severely dilated.   5. The mitral valve is abnormal. Mild to moderate mitral valve  regurgitation. No evidence of mitral stenosis. Moderate mitral annular  calcification.   6. Tricuspid valve regurgitation is moderate to severe.   7. The aortic valve is tricuspid. Aortic valve regurgitation is moderate.  Mild aortic valve stenosis. Aortic valve mean gradient measures 14.0 mmHg.   8. Aortic dilatation  noted. There is mild dilatation of the ascending  aorta, measuring 40 mm.   9. The inferior vena cava is dilated in size with <50% respiratory  variability, suggesting right atrial pressure of 15 mmHg.   Comparison(s): 02/13/21 EF 60-65%. PA pressure 27mmHg. Mild AS 6mmHg mean PG, 14mmHg peak PG.   Epic records are reviewed at length today  CHA2DS2-VASc Score = 5  The patient's score is based upon: CHF History: 1 HTN History: 1 Diabetes History: 0 Stroke History: 0 Vascular Disease History: 1 Age Score: 2 Gender Score: 0       ASSESSMENT AND PLAN: 1. Persistent Atrial Fibrillation/atrial flutter The patient's CHA2DS2-VASc score is 5, indicating a 7.2% annual risk of stroke.   S/p DCCV 11/03/21 with early return of afib Patient never started amiodarone due to concern about side effects. We discussed alternate AAD (dofetilide) and he declines. Will pursue rate control for now.  Continue Xarelto 15 mg daily  2. Secondary Hypercoagulable State (ICD10:  D68.69) The patient is at significant risk for stroke/thromboembolism based upon his CHA2DS2-VASc Score of 5.  Continue Rivaroxaban (Xarelto).   3. HTN Stable, no changes today.  4. CAD DES 10/13/21 Currently on Plavix Has not tolerated BB or statin in the past. No anginal symptoms.  5. Chronic diastolic CHF No signs or symptoms of fluid overload.  6. Valvular heart disease Mild-mod MR Mod AR, mild AS Mod-severe TR   Follow up with Dr Phineas Inches per recall.    [facility] [address] [place],  [address] [phone] 11/30/2021 11:59 AM

## 2021-12-13 ENCOUNTER — Encounter: Payer: Self-pay | Admitting: Neurology

## 2021-12-13 NOTE — Patient Outreach (Signed)
[place] [facility]) Care Management ? ?12/13/2021 ? ?JAZMINE LONGSHORE Sr. ?04/08/1932 ?[id number] ? ? ?Received referral for Care Management from Insurance plan. Assigned patient to Valente David, RN Care Coordinator for follow. ? ?Philmore Pali ?[place] Management Assistant ?[phone] ? ?

## 2021-12-14 ENCOUNTER — Other Ambulatory Visit: Payer: Medicare Other

## 2021-12-14 ENCOUNTER — Other Ambulatory Visit: Payer: Self-pay | Admitting: *Deleted

## 2021-12-14 LAB — VITAMIN B12: Vitamin B-12: 799 pg/mL (ref 211–911)

## 2021-12-14 NOTE — Patient Outreach (Signed)
[place] [facility]) Care Management ? ?12/14/2021 ? ?Marcus MUZQUIZ Sr. ?[birth year] ?[id number] ? ? ?Referral Date: 3/15 ?Referral Source: Insurance ?Referral Reason: Chronic Care Management ?Insurance: BCBS ? ? ?Outreach attempt #1, successful.  Identity verified.  This care manager introduced self and stated purpose of call.  [facility] care management services explained.  Member state he has had great care from the [facility] system and denies need for services.  Attempted to discuss benefits however member abruptly ended call.   ? ?Plan: ?RN CM will send outreach letter with [facility] brochure and follow up within the next 2 weeks. ? ?Valente David, RN, MSN, CCM ?[facility] Care Management  ?Community Care Manager ?[phone] ? ?

## 2021-12-20 ENCOUNTER — Encounter: Payer: Self-pay | Admitting: Neurology

## 2021-12-20 ENCOUNTER — Ambulatory Visit (INDEPENDENT_AMBULATORY_CARE_PROVIDER_SITE_OTHER): Payer: Medicare Other | Admitting: Neurology

## 2021-12-20 ENCOUNTER — Other Ambulatory Visit: Payer: Self-pay

## 2021-12-20 VITALS — BP 151/70 | HR 50 | Ht 68.0 in | Wt 162.0 lb

## 2021-12-20 DIAGNOSIS — E538 Deficiency of other specified B group vitamins: Secondary | ICD-10-CM

## 2021-12-20 DIAGNOSIS — G7000 Myasthenia gravis without (acute) exacerbation: Secondary | ICD-10-CM

## 2021-12-20 DIAGNOSIS — I2510 Atherosclerotic heart disease of native coronary artery without angina pectoris: Secondary | ICD-10-CM | POA: Diagnosis not present

## 2021-12-20 NOTE — Progress Notes (Signed)
? ? ?Follow-up Visit ? ? ?Date: 12/20/21  ? ?Serge A Belue Sr. ?MRN: [id number] ?DOB: 09-10-1932 ? ? ?Interim History: ?Creedence Jetty Peeks Sr. is a 88 y.o. right-handed Caucasian male with hypothyroidism, paroxysmal atrial fibrillation, and seropositive myasthenia gravis, and prior tobacco use returning to the clinic with B12 deficiency and myasthenia gravis. The patient was accompanied to the clinic by self. ? ?He is here for follow-up visit.  He remains asymptomatic with respect to myasthenia gravis and off medications.  He has completed B12 injections x 1 year and levels from last week were improved.  Unfortunately, he did not appreciate any significant change with energy or leg numbness even after his levels came up.  Overall, he is doing well. No new neurological complaints.  ? ? ?Medications:  ?Current Outpatient Medications on File Prior to Visit  ?Medication Sig Dispense Refill  ? amLODipine (NORVASC) 10 MG tablet Take 1 tablet (10 mg total) by mouth daily. 90 tablet 1  ? Carboxymethylcellulose Sodium (THERATEARS) 0.25 % SOLN Place 1-2 drops into both eyes 3 (three) times daily as needed (dry/irritated eyes.).    ? Cholecalciferol (DIALYVITE VITAMIN D 5000) 125 MCG (5000 UT) capsule Take 5,000 Units by mouth daily.    ? clopidogrel (PLAVIX) 75 MG tablet Take 1 tablet (75 mg total) by mouth daily. 90 tablet 1  ? furosemide (LASIX) 20 MG tablet Take 1 tablet (20 mg total) by mouth daily. 90 tablet 3  ? levothyroxine (SYNTHROID) 137 MCG tablet Take 137 mcg by mouth daily before breakfast.    ? Magnesium 400 MG TABS Take 400 mg by mouth daily.    ? nitroGLYCERIN (NITROSTAT) 0.4 MG SL tablet Place 1 tablet (0.4 mg total) under the tongue every 5 (five) minutes as needed for chest pain. 30 tablet 6  ? Rivaroxaban (XARELTO) 15 MG TABS tablet TAKE 1 TABLET(15 MG) BY MOUTH DAILY WITH SUPPER 90 tablet 1  ? spironolactone (ALDACTONE) 25 MG tablet Take 0.5 tablets (12.5 mg total) by mouth daily. 45 tablet 3  ?  testosterone cypionate (DEPOTESTOSTERONE CYPIONATE) 200 MG/ML injection Inject 200 mg into the muscle once a week.    ? ?No current facility-administered medications on file prior to visit.  ? ? ?Allergies:  ?Allergies  ?Allergen Reactions  ? Ace Inhibitors Hives  ? Beta Adrenergic Blockers Hives  ? Statins Hives  ? ? ?Vital Signs:  ?BP (!) 151/70   Pulse (!) 50   Ht '5\' 8"'$  (1.727 m)   Wt 162 lb (73.5 kg)   SpO2 99%   BMI 24.63 kg/m?  ? ?Neurological Exam: ?MENTAL STATUS including orientation to time, place, person is intact.  Speech is not dysarthric.   ? ?CRANIAL NERVES:  Pupils are round and reactive to light.  Extraocular muscles intact.  No ptosis, no worsening with sustained upgaze.  No facial weakness.  Symmetric smile.  Tongue is midline.  ? ?MOTOR:  Motor strength is 5/5 in all extremities.   ? ?SENSORY:  Vibration intact throughout ? ?MSRs:  ?                                         Right        Left ?brachioradialis 2+  2+  ?biceps 2+  2+  ?triceps 2+  2+  ?patellar 3+  3+  ?ankle jerk 2+  2+  ? ?COORDINATION/GAIT:  He is able  to rise to stand without using arms.  Gait is stable, narrow-based, and unassisted. ? ?Data: ?Labs 01/19/2014:  Vitamin B6 134 ?Labs 08/16/2014:  AChR binding 7.2*, AChR blocking 46%, AChR modulating 42% ? ?ABI 12/22/2020:  Normal ? ? ?IMPRESSION/PLAN: ?1.  Seropositive myasthenia gravis in remission, diagnosed 2013, with bulbar weakness.  He has been asymptomatic and off prednisone and mestinon since ~2020 and doing well.  Exam continues to show no evidence of disease manifestation. ?- Continue to monitor ? ?2.  Vitamin B12 deficiency ? - His most recent vitamin B12 level was normal, ok to transition him to oral vitamin B12 1044mg daily ? ?Return to clinic as needed ? ?Thank you for allowing me to participate in patient's care.  If I can answer any additional questions, I would be pleased to do so.   ? ?Sincerely, ? ? ? ?Felita Bump K. PPosey Pronto DO ? ? ? ?

## 2021-12-20 NOTE — Patient Instructions (Signed)
It was great to see you! ? ?You may start vitamin B12 102mg daily ? ? ? ?

## 2021-12-21 ENCOUNTER — Ambulatory Visit: Payer: Medicare Other

## 2021-12-26 ENCOUNTER — Other Ambulatory Visit: Payer: Self-pay | Admitting: *Deleted

## 2021-12-26 NOTE — Patient Outreach (Signed)
[facility]) Care Management ? ?12/26/2021 ? ?KHARI MALLY Sr. ?04-May-1932 ?[id number] ? ? ?Outgoing call placed to follow up on decision for [facility] involvement.  Confirms he did receive information explaining program but again declines participation. Will close case at this time. ? ?Valente David, RN, MSN, CCM ?[facility] Care Management  ?Community Care Manager ?[phone] ? ?

## 2022-01-01 ENCOUNTER — Telehealth (HOSPITAL_COMMUNITY): Payer: Self-pay

## 2022-01-01 NOTE — Telephone Encounter (Signed)
Patient called and was interested in participating in the Cardiac Rehab Program. Patient will come in for orientation on 01/04/2022'@1'$ :15pm and will attend the 7:00am exercise class. ?

## 2022-01-03 ENCOUNTER — Telehealth (HOSPITAL_COMMUNITY): Payer: Self-pay | Admitting: *Deleted

## 2022-01-03 NOTE — Telephone Encounter (Signed)
Left message to call cardiac rehab.[name], RN,BSN ?01/03/2022 11:46 AM  ?

## 2022-01-03 NOTE — Telephone Encounter (Signed)
Completed health history. Confirmed appointment for tomorrow for cardiac rehab orientation.[name], RN,BSN ?01/03/2022 2:27 PM  ?

## 2022-01-04 ENCOUNTER — Encounter (HOSPITAL_COMMUNITY)
Admission: RE | Admit: 2022-01-04 | Discharge: 2022-01-04 | Disposition: A | Discharge status: Home / Self Care | Payer: Medicare Other | Source: Ambulatory Visit | Attending: Cardiology | Admitting: Cardiology

## 2022-01-04 ENCOUNTER — Ambulatory Visit: Payer: Medicare Other | Admitting: Cardiology

## 2022-01-04 VITALS — BP 144/64 | HR 58 | Ht 66.5 in | Wt 161.6 lb

## 2022-01-04 DIAGNOSIS — I2510 Atherosclerotic heart disease of native coronary artery without angina pectoris: Secondary | ICD-10-CM | POA: Insufficient documentation

## 2022-01-04 DIAGNOSIS — Z955 Presence of coronary angioplasty implant and graft: Secondary | ICD-10-CM | POA: Insufficient documentation

## 2022-01-04 NOTE — Progress Notes (Signed)
Cardiac Rehab Medication Review by a Nurse ? ?Does the patient  feel that his/her medications are working for him/her?  yes ? ?Has the patient been experiencing any side effects to the medications prescribed?  no ? ?Does the patient measure his/her own blood pressure or blood glucose at home?  yes  ? ?Does the patient have any problems obtaining medications due to transportation or finances?   no ? ?Understanding of regimen: good ?Understanding of indications: good ?Potential of compliance: good ? ? ? ?Nurse comments: Marcus [name] is taking his medications as prescribed and has a good understanding of what his medications are for. ? ? ? ?Harrell Gave RN ?01/04/2022 2:02 PM ?  ?

## 2022-01-04 NOTE — Progress Notes (Addendum)
Cardiac Individual Treatment Plan ? ?Patient Details  ?Name: Marcus [name] Sr. ?MRN: [id number] ?Date of Birth: [birth year] ?Referring Provider:   ?Flowsheet Row CARDIAC REHAB PHASE II ORIENTATION from 01/04/2022 in [place]  ?Referring Provider Dr. Janina Mayo, MD  ? ?  ? ? ?Initial Encounter Date:  ?Flowsheet Row CARDIAC REHAB PHASE II ORIENTATION from 01/04/2022 in [place]  ?Date 01/04/22  ? ?  ? ? ?Visit Diagnosis: 02/29/23 S/ P DES Prox LAD ? ?Patient's Home Medications on Admission: ? ?Current Outpatient Medications:  ?  amLODipine (NORVASC) 10 MG tablet, Take 1 tablet (10 mg total) by mouth daily., Disp: 90 tablet, Rfl: 1 ?  Carboxymethylcellulose Sodium (THERATEARS) 0.25 % SOLN, Place 1-2 drops into both eyes 3 (three) times daily as needed (dry/irritated eyes.)., Disp: , Rfl:  ?  Cholecalciferol (DIALYVITE VITAMIN D 5000) 125 MCG (5000 UT) capsule, Take 5,000 Units by mouth every evening., Disp: , Rfl:  ?  clopidogrel (PLAVIX) 75 MG tablet, Take 1 tablet (75 mg total) by mouth daily., Disp: 90 tablet, Rfl: 1 ?  furosemide (LASIX) 20 MG tablet, Take 1 tablet (20 mg total) by mouth daily., Disp: 90 tablet, Rfl: 3 ?  levothyroxine (SYNTHROID) 137 MCG tablet, Take 137 mcg by mouth daily before breakfast., Disp: , Rfl:  ?  Magnesium 400 MG TABS, Take 400 mg by mouth every evening., Disp: , Rfl:  ?  nitroGLYCERIN (NITROSTAT) 0.4 MG SL tablet, Place 1 tablet (0.4 mg total) under the tongue every 5 (five) minutes as needed for chest pain., Disp: 30 tablet, Rfl: 6 ?  Rivaroxaban (XARELTO) 15 MG TABS tablet, TAKE 1 TABLET(15 MG) BY MOUTH DAILY WITH SUPPER, Disp: 90 tablet, Rfl: 1 ?  spironolactone (ALDACTONE) 25 MG tablet, Take 0.5 tablets (12.5 mg total) by mouth daily., Disp: 45 tablet, Rfl: 3 ? ?Past Medical History: ?Past Medical History:  ?Diagnosis Date  ? A-fib ([place])   ? Acute on chronic combined systolic and diastolic CHF (congestive heart  failure) ([place]) 08/22/2018  ? Acute on chronic respiratory failure ([place]) 04/15/2015  ? Anemia   ? Atrial flutter ([place])   ? Bradycardia   ? CAD in native artery 08/22/2018  ? CAP (community acquired pneumonia) 04/13/2015  ? See cxr 04/13/2015 > admit [facility]    ? Complication of anesthesia   ? H/O hiatal hernia   ? Hyperlipidemia LDL goal <70 08/22/2018  ? Hypertension 08/22/2018  ? Hypothyroidism 04/13/2015  ? Myasthenia gravis ([place]) 04/13/2015  ? Obesity 04/13/2015  ? Paroxysmal A-fib ([place]) 04/16/2015  ? RBBB   ? S/P angioplasty with stent 08/21/18 DES to RCA 08/22/2018  ? Sepsis ([place]) 04/15/2015  ? Thrombocytopenia ([place])   ? ? ?Tobacco Use: ?Social History  ? ?Tobacco Use  ?Smoking Status Former  ? Packs/day: 1.00  ? Years: 30.00  ? Pack years: 30.00  ? Types: Cigarettes  ? Quit date: 01/10/1982  ? Years since quitting: 40.0  ?Smokeless Tobacco Never  ?Tobacco Comments  ? Former smoker 10/17/2021  ? ? ?Labs: ?Review Flowsheet   ? ?  ?  Latest Ref Rng & Units 06/11/2012 08/21/2018 06/05/2019  ?Labs for ITP Cardiac and Pulmonary Rehab  ?Cholestrol 100 - 199 mg/dL  168   128    ?LDL (calc) 0 - 99 mg/dL  96   67    ?HDL-C >39 mg/dL  61   41    ?Trlycerides 0 - 149 mg/dL  56  109    ?TCO2 0 - 100 mmol/L 19      ?  ? ? Multiple values from one day are sorted in reverse-chronological order  ?  ?  ? ? ?Capillary Blood Glucose: ?Lab Results  ?Component Value Date  ? GLUCAP 98 07/10/2013  ? ? ? ?Exercise Target Goals: ?Exercise Program Goal: ?Individual exercise prescription set using results from initial 6 min walk test and THRR while considering  patient?s activity barriers and safety.  ? ?Exercise Prescription Goal: ?Starting with aerobic activity 30 plus minutes a day, 3 days per week for initial exercise prescription. Provide home exercise prescription and guidelines that participant acknowledges understanding prior to discharge. ? ?Activity Barriers & Risk Stratification: ? Activity Barriers & Cardiac Risk Stratification -  01/04/22 1449   ? ?  ? Activity Barriers & Cardiac Risk Stratification  ? Activity Barriers Balance Concerns;Deconditioning;Muscular Weakness   ? Cardiac Risk Stratification High   ? ?  ?  ? ?  ? ? ?6 Minute Walk: ? 6 Minute Walk   ? ? [place] Name 01/04/22 1444  ?  ?  ?  ? 6 Minute Walk  ? Phase Initial    ? Distance 1138 feet    ? Walk Time 6 minutes    ? # of Rest Breaks 0    ? MPH 2.16    ? METS 1.44    ? RPE 13    ? Perceived Dyspnea  0    ? VO2 Peak 5.03    ? Symptoms Yes (comment)    ? Comments Headache at the 3.30 mark, pt wanted to continue, resolved by end of walk test    ? Resting HR 58 bpm    ? Resting BP 144/64    ? Resting Oxygen Saturation  99 %    ? Exercise Oxygen Saturation  during 6 min walk 100 %    ? Max Ex. HR 66 bpm    ? Max Ex. BP 128/62    ? 2 Minute Post BP 142/70    ? ?  ?  ? ?  ? ? ?Oxygen Initial Assessment: ? ? ?Oxygen Re-Evaluation: ? ? ?Oxygen Discharge (Final Oxygen Re-Evaluation): ? ? ?Initial Exercise Prescription: ? Initial Exercise Prescription - 01/04/22 1400   ? ?  ? Date of Initial Exercise RX and Referring Provider  ? Date 01/04/22   ? Referring Provider Dr. Janina Mayo, MD   ? Expected Discharge Date 03/02/22   ?  ? NuStep  ? Level 1   ? SPM 60   ? Minutes 15   ? METs 1.8   ?  ? Arm Ergometer  ? Level 1   ? Minutes 15   ? METs 1.8   ?  ? Prescription Details  ? Frequency (times per week) 3   ? Duration Progress to 30 minutes of continuous aerobic without signs/symptoms of physical distress   ?  ? Intensity  ? THRR 40-80% of Max Heartrate 53-106   ? Ratings of Perceived Exertion 11-13   ? Perceived Dyspnea 0-4   ?  ? Progression  ? Progression Continue progressive overload as per policy without signs/symptoms or physical distress.   ?  ? Resistance Training  ? Training Prescription Yes   ? Weight 2lbs   ? Reps 10-15   ? ?  ?  ? ?  ? ? ?Perform Capillary Blood Glucose checks as needed. ? ?Exercise Prescription Changes: ? ? ?Exercise Comments: ? ? ?  Exercise Goals and Review: ? ?  Exercise Goals   ? ? [place] Name 01/04/22 1453  ?  ?  ?  ?  ?  ? Exercise Goals  ? Increase Physical Activity Yes      ? Intervention Provide advice, education, support and counseling about physical activity/exercise needs.;Develop an individualized exercise prescription for aerobic and resistive training based on initial evaluation findings, risk stratification, comorbidities and participant's personal goals.      ? Expected Outcomes Short Term: Attend rehab on a regular basis to increase amount of physical activity.;Long Term: Exercising regularly at least 3-5 days a week.;Long Term: Add in home exercise to make exercise part of routine and to increase amount of physical activity.      ? Increase Strength and Stamina Yes      ? Intervention Provide advice, education, support and counseling about physical activity/exercise needs.;Develop an individualized exercise prescription for aerobic and resistive training based on initial evaluation findings, risk stratification, comorbidities and participant's personal goals.      ? Expected Outcomes Short Term: Increase workloads from initial exercise prescription for resistance, speed, and METs.;Short Term: Perform resistance training exercises routinely during rehab and add in resistance training at home;Long Term: Improve cardiorespiratory fitness, muscular endurance and strength as measured by increased METs and functional capacity (6MWT)      ? Able to understand and use rate of perceived exertion (RPE) scale Yes      ? Intervention Provide education and explanation on how to use RPE scale      ? Expected Outcomes Short Term: Able to use RPE daily in rehab to express subjective intensity level;Long Term:  Able to use RPE to guide intensity level when exercising independently      ? Knowledge and understanding of Target Heart Rate Range (THRR) Yes      ? Intervention Provide education and explanation of THRR including how the numbers were predicted and where they are  located for reference      ? Expected Outcomes Short Term: Able to state/look up THRR;Long Term: Able to use THRR to govern intensity when exercising independently;Short Term: Able to use daily as guideline for

## 2022-01-08 ENCOUNTER — Encounter: Payer: Self-pay | Admitting: Cardiology

## 2022-01-08 ENCOUNTER — Encounter (HOSPITAL_COMMUNITY)
Admission: RE | Admit: 2022-01-08 | Discharge: 2022-01-08 | Disposition: A | Discharge status: Home / Self Care | Payer: Medicare Other | Source: Ambulatory Visit | Attending: Cardiology | Admitting: Cardiology

## 2022-01-08 DIAGNOSIS — I2510 Atherosclerotic heart disease of native coronary artery without angina pectoris: Secondary | ICD-10-CM | POA: Diagnosis not present

## 2022-01-08 DIAGNOSIS — Z955 Presence of coronary angioplasty implant and graft: Secondary | ICD-10-CM | POA: Diagnosis not present

## 2022-01-08 NOTE — Progress Notes (Signed)
QUALITY OF LIFE SCORE REVIEW ? Pt completed Quality of Life survey as a participant in Cardiac Rehab.  Scores 21.0 or below are considered low.  Overall 22.76, Health and Function 21.03, socioeconomic 22.75, physiological and spiritual, family 27.60. Patient quality of life slightly altered by physical constraints which limits ability to perform as prior to recent cardiac illness and advanced age. Philipp has accepted this and hopes the exercise program will help with his stamina and energy level.  Will continue to monitor and intervene as necessary.  Cherre Huger, BSN ?Cardiac and Pulmonary Rehab Nurse Navigator  ? ? ?

## 2022-01-08 NOTE — Progress Notes (Signed)
Daily Session Note ? ?Patient Details  ?Name: Marcus [name] Sr. ?MRN: [id number] ?Date of Birth: November 10, 1931 ?Referring Provider:   ?Flowsheet Row CARDIAC REHAB PHASE II ORIENTATION from 01/04/2022 in [place]  ?Referring Provider Dr. Janina Mayo, MD  ? ?  ? ? ?Encounter Date: 01/08/2022 ? ?Check In: ? Session Check In - 01/08/22 0715   ? ?  ? Check-In  ? Supervising physician immediately available to respond to emergencies [facility] immediately available   ? Physician(s) Dr. Maryland Pink   ? Location [facility]   ? Staff Present Maurice Small, RN, Deland Pretty, MS, ACSM CEP, Exercise Physiologist;Kaylee Rosana Hoes, MS, ACSM-CEP, Exercise Physiologist;Annedrea Rosezella [place], RN, [place]   ? Virtual Visit No   ? Medication changes reported     No   ? Fall or balance concerns reported    No   ? Tobacco Cessation No Change   ? Current number of cigarettes/nicotine per day     0   ? Warm-up and Cool-down Performed as group-led instruction   ? Resistance Training Performed Yes   ? VAD Patient? No   ? PAD/SET Patient? No   ?  ? Pain Assessment  ? Currently in Pain? No/denies   ? Pain Score 0-No pain   ? Multiple Pain Sites No   ? ?  ?  ? ?  ? ? ?Capillary Blood Glucose: ?No results found for this or any previous visit (from the past 24 hour(s)). ? ? Exercise Prescription Changes - 01/08/22 0708   ? ?  ? Response to Exercise  ? Blood Pressure (Admit) 158/70   ? Blood Pressure (Exercise) 138/68   ? Blood Pressure (Exit) 122/70   ? Heart Rate (Admit) 68 bpm   ? Heart Rate (Exercise) 74 bpm   ? Heart Rate (Exit) 37 bpm   ? Rating of Perceived Exertion (Exercise) 12   ? Symptoms None   ? Comments Off to a good start with exercise.   ? Duration Continue with 30 min of aerobic exercise without signs/symptoms of physical distress.   ? Intensity THRR unchanged   ?  ? Progression  ? Progression Continue to progress workloads to maintain intensity without signs/symptoms of  physical distress.   ? Average METs 1.5   ?  ? Resistance Training  ? Training Prescription Yes   ? Weight 2lbs   ? Reps 10-15   ? Time 10 Minutes   ?  ? Interval Training  ? Interval Training No   ?  ? NuStep  ? Level 1   ? SPM 60   ? Minutes 15   ? METs 1.9   ?  ? Arm Ergometer  ? Level 1   ? [place] 1   ? Minutes 15   ? METs 1.07   ? ?  ?  ? ?  ? ? ?Social History  ? ?Tobacco Use  ?Smoking Status Former  ? Packs/day: 1.00  ? Years: 30.00  ? Pack years: 30.00  ? Types: Cigarettes  ? Quit date: 01/10/1982  ? Years since quitting: 40.0  ?Smokeless Tobacco Never  ?Tobacco Comments  ? Former smoker 10/17/2021  ? ? ?Goals Met:  ?Exercise tolerated well ?No report of concerns or symptoms today ?Strength training completed today ? ?Goals Unmet:  ?Not Applicable ? ?Comments: Pt started cardiac rehab today.  Pt tolerated light exercise without difficulty. VSS, telemetry-chronic afib/flutter, bradycardia noted during cool down - 37 asymptomatic.  Medication  list reconciled. Pt denies barriers to medication compliance.  PSYCHOSOCIAL ASSESSMENT:  PHQ-0. Pt exhibits positive coping skills, hopeful outlook with supportive family. No psychosocial needs identified at this time, no psychosocial interventions necessary.    Pt enjoys playing bridge.   Pt oriented to exercise equipment and routine.    Understanding verbalized.  ? ? ?Dr. Fransico Him is Medical Director for Cardiac Rehab at [facility]. ?

## 2022-01-09 ENCOUNTER — Other Ambulatory Visit: Payer: Self-pay

## 2022-01-09 MED ORDER — AMLODIPINE BESYLATE 10 MG PO TABS: 10.0000 mg | ORAL_TABLET | Freq: Every day | ORAL | 1 refills | Status: DC

## 2022-01-09 NOTE — Progress Notes (Signed)
Cardiac Individual Treatment Plan ? ?Patient Details  ?Name: Marcus Kaufmann Sr. ?MRN: [id number] ?Date of Birth: October 30, 1931 ?Referring Provider:   ?Flowsheet Row CARDIAC REHAB PHASE II ORIENTATION from 01/04/2022 in [facility]  ?Referring Provider Dr. Janina Mayo, MD  ? ?  ? ? ?Initial Encounter Date:  ?Flowsheet Row CARDIAC REHAB PHASE II ORIENTATION from 01/04/2022 in [facility]  ?Date 01/04/22  ? ?  ? ? ?Visit Diagnosis: 02/29/23 S/ P DES Prox LAD ? ?Patient's Home Medications on Admission: ? ?Current Outpatient Medications:  ?  amLODipine (NORVASC) 10 MG tablet, Take 1 tablet (10 mg total) by mouth daily., Disp: 90 tablet, Rfl: 1 ?  Carboxymethylcellulose Sodium (THERATEARS) 0.25 % SOLN, Place 1-2 drops into both eyes 3 (three) times daily as needed (dry/irritated eyes.)., Disp: , Rfl:  ?  Cholecalciferol (DIALYVITE VITAMIN D 5000) 125 MCG (5000 UT) capsule, Take 5,000 Units by mouth every evening., Disp: , Rfl:  ?  clopidogrel (PLAVIX) 75 MG tablet, Take 1 tablet (75 mg total) by mouth daily., Disp: 90 tablet, Rfl: 1 ?  furosemide (LASIX) 20 MG tablet, Take 1 tablet (20 mg total) by mouth daily., Disp: 90 tablet, Rfl: 3 ?  levothyroxine (SYNTHROID) 137 MCG tablet, Take 137 mcg by mouth daily before breakfast., Disp: , Rfl:  ?  Magnesium 400 MG TABS, Take 400 mg by mouth every evening., Disp: , Rfl:  ?  nitroGLYCERIN (NITROSTAT) 0.4 MG SL tablet, Place 1 tablet (0.4 mg total) under the tongue every 5 (five) minutes as needed for chest pain., Disp: 30 tablet, Rfl: 6 ?  Rivaroxaban (XARELTO) 15 MG TABS tablet, TAKE 1 TABLET(15 MG) BY MOUTH DAILY WITH SUPPER, Disp: 90 tablet, Rfl: 1 ?  spironolactone (ALDACTONE) 25 MG tablet, Take 0.5 tablets (12.5 mg total) by mouth daily., Disp: 45 tablet, Rfl: 3 ? ?Past Medical History: ?Past Medical History:  ?Diagnosis Date  ? A-fib ([place])   ? Acute on chronic combined systolic and diastolic CHF (congestive heart  failure) ([place]) 08/22/2018  ? Acute on chronic respiratory failure ([place]) 04/15/2015  ? Anemia   ? Atrial flutter ([place])   ? Bradycardia   ? CAD in native artery 08/22/2018  ? CAP (community acquired pneumonia) 04/13/2015  ? See cxr 04/13/2015 > admit [facility]    ? Complication of anesthesia   ? H/O hiatal hernia   ? Hyperlipidemia LDL goal <70 08/22/2018  ? Hypertension 08/22/2018  ? Hypothyroidism 04/13/2015  ? Myasthenia gravis ([place]) 04/13/2015  ? Obesity 04/13/2015  ? Paroxysmal A-fib ([place]) 04/16/2015  ? RBBB   ? S/P angioplasty with stent 08/21/18 DES to RCA 08/22/2018  ? Sepsis ([place]) 04/15/2015  ? Thrombocytopenia ([place])   ? ? ?Tobacco Use: ?Social History  ? ?Tobacco Use  ?Smoking Status Former  ? Packs/day: 1.00  ? Years: 30.00  ? Pack years: 30.00  ? Types: Cigarettes  ? Quit date: 01/10/1982  ? Years since quitting: 40.0  ?Smokeless Tobacco Never  ?Tobacco Comments  ? Former smoker 10/17/2021  ? ? ?Labs: ?Review Flowsheet   ? ?  ?  Latest Ref Rng & Units 06/11/2012 08/21/2018 06/05/2019  ?Labs for ITP Cardiac and Pulmonary Rehab  ?Cholestrol 100 - 199 mg/dL  168   128    ?LDL (calc) 0 - 99 mg/dL  96   67    ?HDL-C >39 mg/dL  61   41    ?Trlycerides 0 - 149 mg/dL  56  109    ?TCO2 0 - 100 mmol/L 19      ?  ? ? Multiple values from one day are sorted in reverse-chronological order  ?  ?  ? ? ?Capillary Blood Glucose: ?Lab Results  ?Component Value Date  ? GLUCAP 98 07/10/2013  ? ? ? ?Exercise Target Goals: ?Exercise Program Goal: ?Individual exercise prescription set using results from initial 6 min walk test and THRR while considering  patient?s activity barriers and safety.  ? ?Exercise Prescription Goal: ?Initial exercise prescription builds to 30-45 minutes a day of aerobic activity, 2-3 days per week.  Home exercise guidelines will be given to patient during program as part of exercise prescription that the participant will acknowledge. ? ?Activity Barriers & Risk Stratification: ? Activity Barriers & Cardiac Risk  Stratification - 01/04/22 1449   ? ?  ? Activity Barriers & Cardiac Risk Stratification  ? Activity Barriers Balance Concerns;Deconditioning;Muscular Weakness   ? Cardiac Risk Stratification High   ? ?  ?  ? ?  ? ? ?6 Minute Walk: ? 6 Minute Walk   ? ? [place] Name 01/04/22 1444  ?  ?  ?  ? 6 Minute Walk  ? Phase Initial    ? Distance 1138 feet    ? Walk Time 6 minutes    ? # of Rest Breaks 0    ? MPH 2.16    ? METS 1.44    ? RPE 13    ? Perceived Dyspnea  0    ? VO2 Peak 5.03    ? Symptoms Yes (comment)    ? Comments Headache at the 3.30 mark, pt wanted to continue, resolved by end of walk test    ? Resting HR 58 bpm    ? Resting BP 144/64    ? Resting Oxygen Saturation  99 %    ? Exercise Oxygen Saturation  during 6 min walk 100 %    ? Max Ex. HR 66 bpm    ? Max Ex. BP 128/62    ? 2 Minute Post BP 142/70    ? ?  ?  ? ?  ? ? ?Oxygen Initial Assessment: ? ? ?Oxygen Re-Evaluation: ? ? ?Oxygen Discharge (Final Oxygen Re-Evaluation): ? ? ?Initial Exercise Prescription: ? Initial Exercise Prescription - 01/04/22 1400   ? ?  ? Date of Initial Exercise RX and Referring Provider  ? Date 01/04/22   ? Referring Provider Dr. Janina Mayo, MD   ? Expected Discharge Date 03/02/22   ?  ? NuStep  ? Level 1   ? SPM 60   ? Minutes 15   ? METs 1.8   ?  ? Arm Ergometer  ? Level 1   ? Minutes 15   ? METs 1.8   ?  ? Prescription Details  ? Frequency (times per week) 3   ? Duration Progress to 30 minutes of continuous aerobic without signs/symptoms of physical distress   ?  ? Intensity  ? THRR 40-80% of Max Heartrate 53-106   ? Ratings of Perceived Exertion 11-13   ? Perceived Dyspnea 0-4   ?  ? Progression  ? Progression Continue progressive overload as per policy without signs/symptoms or physical distress.   ?  ? Resistance Training  ? Training Prescription Yes   ? Weight 2lbs   ? Reps 10-15   ? ?  ?  ? ?  ? ? ?Perform Capillary Blood Glucose checks as needed. ? ?Exercise Prescription  Changes: ? ? Exercise Prescription Changes   ? ? [place]  Name 01/08/22 0708  ?  ?  ?  ?  ?  ? Response to Exercise  ? Blood Pressure (Admit) 158/70      ? Blood Pressure (Exercise) 138/68      ? Blood Pressure (Exit) 122/70      ? Heart Rate (Admit) 68 bpm      ? Heart Rate (Exercise) 74 bpm      ? Heart Rate (Exit) 37 bpm      ? Rating of Perceived Exertion (Exercise) 12      ? Symptoms None      ? Comments Off to a good start with exercise.      ? Duration Continue with 30 min of aerobic exercise without signs/symptoms of physical distress.      ? Intensity THRR unchanged      ?  ? Progression  ? Progression Continue to progress workloads to maintain intensity without signs/symptoms of physical distress.      ? Average METs 1.5      ?  ? Resistance Training  ? Training Prescription Yes      ? Weight 2lbs      ? Reps 10-15      ? Time 10 Minutes      ?  ? Interval Training  ? Interval Training No      ?  ? NuStep  ? Level 1      ? SPM 60      ? Minutes 15      ? METs 1.9      ?  ? Arm Ergometer  ? Level 1      ? Watts 1      ? Minutes 15      ? METs 1.07      ? ?  ?  ? ?  ? ? ?Exercise Comments: ? ? Exercise Comments   ? ? [place] Name 01/08/22 5697  ?  ?  ?  ?  ? Exercise Comments Patient tolerated low intensity exercise without symptoms.      ? ?  ?  ? ?  ? ? ?Exercise Goals and Review: ? ? Exercise Goals   ? ? [place] Name 01/04/22 1453  ?  ?  ?  ?  ?  ? Exercise Goals  ? Increase Physical Activity Yes      ? Intervention Provide advice, education, support and counseling about physical activity/exercise needs.;Develop an individualized exercise prescription for aerobic and resistive training based on initial evaluation findings, risk stratification, comorbidities and participant's personal goals.      ? Expected Outcomes Short Term: Attend rehab on a regular basis to increase amount of physical activity.;Long Term: Exercising regularly at least 3-5 days a week.;Long Term: Add in home exercise to make exercise part of routine and to increase amount of physical activity.      ?  Increase Strength and Stamina Yes      ? Intervention Provide advice, education, support and counseling about physical activity/exercise needs.;Develop an individualized exercise prescription for aerobi

## 2022-01-10 ENCOUNTER — Encounter (HOSPITAL_COMMUNITY)
Admission: RE | Admit: 2022-01-10 | Discharge: 2022-01-10 | Disposition: A | Discharge status: Home / Self Care | Payer: Medicare Other | Source: Ambulatory Visit | Attending: Cardiology | Admitting: Cardiology

## 2022-01-10 DIAGNOSIS — I2510 Atherosclerotic heart disease of native coronary artery without angina pectoris: Secondary | ICD-10-CM | POA: Diagnosis not present

## 2022-01-10 DIAGNOSIS — Z955 Presence of coronary angioplasty implant and graft: Secondary | ICD-10-CM

## 2022-01-10 NOTE — Progress Notes (Signed)
Pt in today for cardiac rehab.  Noted on his left upper arm skin tear with soiled band aid. Pt stated that he hit his arm going in a restaurant door and because of blood thinners it always leaves a mark.  Removed soiled band aid and cleaned the area with soap and water and applied 2x2 gauze and secured with tegaderm.  Noted a small bleeding area with heavy ecchymosis on his arm.  No further complaints. Cherre Huger, BSN ?Cardiac and Pulmonary Rehab Nurse Navigator  ? ?

## 2022-01-11 ENCOUNTER — Other Ambulatory Visit: Payer: Self-pay | Admitting: Physician Assistant

## 2022-01-11 NOTE — Telephone Encounter (Signed)
This is Dr. [name]'s pt. °

## 2022-01-12 ENCOUNTER — Ambulatory Visit (INDEPENDENT_AMBULATORY_CARE_PROVIDER_SITE_OTHER): Payer: Medicare Other | Admitting: Podiatry

## 2022-01-12 ENCOUNTER — Encounter: Payer: Self-pay | Admitting: Podiatry

## 2022-01-12 ENCOUNTER — Encounter (HOSPITAL_COMMUNITY)
Admission: RE | Admit: 2022-01-12 | Discharge: 2022-01-12 | Disposition: A | Discharge status: Home / Self Care | Payer: Medicare Other | Source: Ambulatory Visit | Attending: Cardiology | Admitting: Cardiology

## 2022-01-12 DIAGNOSIS — Q828 Other specified congenital malformations of skin: Secondary | ICD-10-CM | POA: Diagnosis not present

## 2022-01-12 DIAGNOSIS — B351 Tinea unguium: Secondary | ICD-10-CM

## 2022-01-12 DIAGNOSIS — M79674 Pain in right toe(s): Secondary | ICD-10-CM | POA: Diagnosis not present

## 2022-01-12 DIAGNOSIS — Z955 Presence of coronary angioplasty implant and graft: Secondary | ICD-10-CM | POA: Diagnosis not present

## 2022-01-12 DIAGNOSIS — E538 Deficiency of other specified B group vitamins: Secondary | ICD-10-CM

## 2022-01-12 DIAGNOSIS — M79675 Pain in left toe(s): Secondary | ICD-10-CM | POA: Diagnosis not present

## 2022-01-12 DIAGNOSIS — L84 Corns and callosities: Secondary | ICD-10-CM | POA: Diagnosis not present

## 2022-01-12 DIAGNOSIS — I2510 Atherosclerotic heart disease of native coronary artery without angina pectoris: Secondary | ICD-10-CM | POA: Diagnosis not present

## 2022-01-12 DIAGNOSIS — G629 Polyneuropathy, unspecified: Secondary | ICD-10-CM

## 2022-01-15 ENCOUNTER — Encounter (HOSPITAL_COMMUNITY)
Admission: RE | Admit: 2022-01-15 | Discharge: 2022-01-15 | Disposition: A | Discharge status: Home / Self Care | Payer: Medicare Other | Source: Ambulatory Visit | Attending: Cardiology | Admitting: Cardiology

## 2022-01-15 DIAGNOSIS — Z955 Presence of coronary angioplasty implant and graft: Secondary | ICD-10-CM | POA: Diagnosis not present

## 2022-01-15 DIAGNOSIS — I2510 Atherosclerotic heart disease of native coronary artery without angina pectoris: Secondary | ICD-10-CM | POA: Diagnosis not present

## 2022-01-17 ENCOUNTER — Encounter (HOSPITAL_COMMUNITY)
Admission: RE | Admit: 2022-01-17 | Discharge: 2022-01-17 | Disposition: A | Discharge status: Home / Self Care | Payer: Medicare Other | Source: Ambulatory Visit | Attending: Cardiology | Admitting: Cardiology

## 2022-01-17 DIAGNOSIS — I2510 Atherosclerotic heart disease of native coronary artery without angina pectoris: Secondary | ICD-10-CM | POA: Diagnosis not present

## 2022-01-17 DIAGNOSIS — Z955 Presence of coronary angioplasty implant and graft: Secondary | ICD-10-CM

## 2022-01-19 ENCOUNTER — Encounter (HOSPITAL_COMMUNITY)
Admission: RE | Admit: 2022-01-19 | Discharge: 2022-01-19 | Disposition: A | Discharge status: Home / Self Care | Payer: Medicare Other | Source: Ambulatory Visit | Attending: Cardiology | Admitting: Cardiology

## 2022-01-19 DIAGNOSIS — Z955 Presence of coronary angioplasty implant and graft: Secondary | ICD-10-CM | POA: Diagnosis not present

## 2022-01-19 DIAGNOSIS — I2510 Atherosclerotic heart disease of native coronary artery without angina pectoris: Secondary | ICD-10-CM | POA: Diagnosis not present

## 2022-01-21 NOTE — Progress Notes (Addendum)
?  Subjective:  ?Patient ID: Marcus Kaufmann Sr., male    DOB: April 11, 1932,  MRN: [id number] ? ?Marcus A Marcinek Sr. presents to clinic today for at risk foot care with history of peripheral neuropathy and painful porokeratotic lesion(s) right foot, preulcerative lesion left foot and painful mycotic toenails that limit ambulation. Painful toenails interfere with ambulation. Aggravating factors include wearing enclosed shoe gear. Pain is relieved with periodic professional debridement. Painful porokeratotic lesion and preulcerative esion are both aggravated when weightbearing with and without shoegear. Pain is relieved with periodic professional debridement. ? ?New problem(s): None.  ? ?PCP is [name], MD , and last visit was November 28, 2021. ? ?Allergies  ?Allergen Reactions  ? Ace Inhibitors Hives  ? Beta Adrenergic Blockers Hives  ? Statins Hives  ? ? ?Review of Systems: Negative except as noted in the HPI. ? ?Objective: No changes noted in today's physical examination. ? ?Constitutional Marcus A Everhart Sr. is a pleasant 88 y.o. Caucasian male, WD, WN in NAD. AAO x 3.   ?Vascular CFT immediate b/l LE. Palpable DP/PT pulses b/l LE. Digital hair sparse b/l. Skin temperature gradient WNL b/l. No pain with calf compression b/l. No edema noted b/l. No cyanosis or clubbing noted b/l LE.  ?Neurologic Normal speech. Oriented to person, place, and time. Pt has subjective symptoms of neuropathy. Protective sensation intact 5/5 intact bilaterally with 10g monofilament b/l. Vibratory sensation intact b/l.  ?Dermatologic Pedal integument with normal turgor, texture and tone BLE. No open wounds b/l LE. No interdigital macerations noted b/l LE. Toenails 1-5 b/l elongated, discolored, dystrophic, thickened, crumbly with subungual debris and tenderness to dorsal palpation. Porokeratotic lesion(s) submet head 5 right foot. No erythema, no edema, no drainage, no fluctuance. Preulcerative lesion noted submet head 5 left foot.  There is visible subdermal hemorrhage. There is no surrounding erythema, no edema, no drainage, no odor, no fluctuance.Incurvated nailplate lleft 4th toe medial medial border(s) with tenderness to palpation. No erythema, no edema, no drainage noted.   ?Orthopedic: Normal muscle strength 5/5 to all lower extremity muscle groups bilaterally. HAV with bunion deformity noted b/l LE.Marland Kitchen No pain, crepitus or joint limitation noted with ROM b/l LE.  Patient ambulates independently without assistive aids. Wearing gray [employer] loafers.  ? ?Radiographs: None ? ?Assessment/Plan: ?1. Pain due to onychomycosis of toenails of both feet   ?2. Pre-ulcerative calluses   ?3. Porokeratosis   ?4. Neuropathy   ?5. B12 deficiency   ?  ? ?-Patient was evaluated and treated. All patient's and/or POA's questions/concerns answered on today's visit. ?-Patient to continue soft, supportive shoe gear daily. ?-Toenails 1-5 bilaterally were debrided in length and girth with sterile nail nippers and dremel. Pinpoint bleeding of L 4th toe addressed with Lumicain Hemostatic Solution, cleansed with alcohol. triple antibiotic ointment applied. Patient instructed to apply triple antibiotic ointment once daily for 7 days. ?-Preulcerative lesion pared submet head 5 left foot. Total number pared=1. ?-Painful porokeratotic lesion(s) submet head 5 right foot pared and enucleated with sterile scalpel blade without incident. Total number of lesions debrided=1. ?-Patient/POA to call should there be question/concern in the interim.  ? ?Return in about 3 months (around 04/13/2022). ? ?Marzetta Board, DPM  ?

## 2022-01-22 ENCOUNTER — Encounter (HOSPITAL_COMMUNITY)
Admission: RE | Admit: 2022-01-22 | Discharge: 2022-01-22 | Disposition: A | Discharge status: Home / Self Care | Payer: Medicare Other | Source: Ambulatory Visit | Attending: Cardiology | Admitting: Cardiology

## 2022-01-22 DIAGNOSIS — I2510 Atherosclerotic heart disease of native coronary artery without angina pectoris: Secondary | ICD-10-CM | POA: Diagnosis not present

## 2022-01-22 DIAGNOSIS — Z955 Presence of coronary angioplasty implant and graft: Secondary | ICD-10-CM | POA: Diagnosis not present

## 2022-01-22 NOTE — Progress Notes (Signed)
CARDIAC REHAB PHASE 2 ? ?Reviewed home exercise with pt today. Pt is tolerating exercise well. Pt will continue to exercise on their own by walking with a cart at [employer] for 20-30 minutes per session 1-2 days a week in addition to the 3 days in CRP2. Pt plans to join [employer] after graduation. Advised pt on THRR, RPE scale, hydration and temperature/humidity precautions. Reinforced S/S to stop exercise and when to call MD vs 911. Encouraged warm up cool down and stretches with exercise sessions. Pt verbalized understanding, all questions were answered and pt was given a copy to take home.  ?  ?Kirby Funk ACSM-CEP ?01/22/2022 ?11:57 AM ? ?

## 2022-01-24 ENCOUNTER — Encounter (HOSPITAL_COMMUNITY)
Admission: RE | Admit: 2022-01-24 | Discharge: 2022-01-24 | Disposition: A | Discharge status: Home / Self Care | Payer: Medicare Other | Source: Ambulatory Visit | Attending: Cardiology | Admitting: Cardiology

## 2022-01-24 DIAGNOSIS — I2510 Atherosclerotic heart disease of native coronary artery without angina pectoris: Secondary | ICD-10-CM | POA: Diagnosis not present

## 2022-01-24 DIAGNOSIS — Z955 Presence of coronary angioplasty implant and graft: Secondary | ICD-10-CM | POA: Diagnosis not present

## 2022-01-26 ENCOUNTER — Encounter (HOSPITAL_COMMUNITY)
Admission: RE | Admit: 2022-01-26 | Discharge: 2022-01-26 | Disposition: A | Discharge status: Home / Self Care | Payer: Medicare Other | Source: Ambulatory Visit | Attending: Cardiology | Admitting: Cardiology

## 2022-01-26 DIAGNOSIS — I2510 Atherosclerotic heart disease of native coronary artery without angina pectoris: Secondary | ICD-10-CM | POA: Diagnosis not present

## 2022-01-26 DIAGNOSIS — Z955 Presence of coronary angioplasty implant and graft: Secondary | ICD-10-CM

## 2022-01-29 ENCOUNTER — Encounter: Payer: Self-pay | Admitting: Internal Medicine

## 2022-01-29 ENCOUNTER — Encounter (HOSPITAL_COMMUNITY)
Admission: RE | Admit: 2022-01-29 | Discharge: 2022-01-29 | Disposition: A | Discharge status: Home / Self Care | Payer: Medicare Other | Source: Ambulatory Visit | Attending: Cardiology | Admitting: Cardiology

## 2022-01-29 DIAGNOSIS — Z955 Presence of coronary angioplasty implant and graft: Secondary | ICD-10-CM | POA: Diagnosis not present

## 2022-01-29 DIAGNOSIS — Z48812 Encounter for surgical aftercare following surgery on the circulatory system: Secondary | ICD-10-CM | POA: Insufficient documentation

## 2022-01-31 ENCOUNTER — Encounter (HOSPITAL_COMMUNITY)
Admission: RE | Admit: 2022-01-31 | Discharge: 2022-01-31 | Disposition: A | Discharge status: Home / Self Care | Payer: Medicare Other | Source: Ambulatory Visit | Attending: Cardiology | Admitting: Cardiology

## 2022-01-31 DIAGNOSIS — H6123 Impacted cerumen, bilateral: Secondary | ICD-10-CM | POA: Diagnosis not present

## 2022-01-31 DIAGNOSIS — Z955 Presence of coronary angioplasty implant and graft: Secondary | ICD-10-CM

## 2022-01-31 DIAGNOSIS — Z48812 Encounter for surgical aftercare following surgery on the circulatory system: Secondary | ICD-10-CM | POA: Diagnosis not present

## 2022-01-31 DIAGNOSIS — H9193 Unspecified hearing loss, bilateral: Secondary | ICD-10-CM | POA: Diagnosis not present

## 2022-02-02 ENCOUNTER — Encounter (HOSPITAL_COMMUNITY)
Admission: RE | Admit: 2022-02-02 | Discharge: 2022-02-02 | Disposition: A | Discharge status: Home / Self Care | Payer: Medicare Other | Source: Ambulatory Visit | Attending: Cardiology | Admitting: Cardiology

## 2022-02-02 DIAGNOSIS — Z955 Presence of coronary angioplasty implant and graft: Secondary | ICD-10-CM | POA: Diagnosis not present

## 2022-02-02 DIAGNOSIS — Z48812 Encounter for surgical aftercare following surgery on the circulatory system: Secondary | ICD-10-CM | POA: Diagnosis not present

## 2022-02-05 ENCOUNTER — Encounter (HOSPITAL_COMMUNITY)
Admission: RE | Admit: 2022-02-05 | Discharge: 2022-02-05 | Disposition: A | Discharge status: Home / Self Care | Payer: Medicare Other | Source: Ambulatory Visit | Attending: Cardiology | Admitting: Cardiology

## 2022-02-05 DIAGNOSIS — Z955 Presence of coronary angioplasty implant and graft: Secondary | ICD-10-CM

## 2022-02-05 DIAGNOSIS — Z48812 Encounter for surgical aftercare following surgery on the circulatory system: Secondary | ICD-10-CM | POA: Diagnosis not present

## 2022-02-06 NOTE — Progress Notes (Signed)
Cardiac Individual Treatment Plan ? ?Patient Details  ?Name: Marcus [name] Sr. ?MRN: [id number] ?Date of Birth: 02-15-1932 ?Referring Provider:   ?Flowsheet Row CARDIAC REHAB PHASE II ORIENTATION from 01/04/2022 in [facility]  ?Referring Provider Dr. Janina Mayo, MD  ? ?  ? ? ?Initial Encounter Date:  ?Flowsheet Row CARDIAC REHAB PHASE II ORIENTATION from 01/04/2022 in [facility]  ?Date 01/04/22  ? ?  ? ? ?Visit Diagnosis: 02/29/23 S/ P DES Prox LAD ? ?Patient's Home Medications on Admission: ? ?Current Outpatient Medications:  ?  amLODipine (NORVASC) 10 MG tablet, Take 1 tablet (10 mg total) by mouth daily., Disp: 90 tablet, Rfl: 1 ?  Carboxymethylcellulose Sodium (THERATEARS) 0.25 % SOLN, Place 1-2 drops into both eyes 3 (three) times daily as needed (dry/irritated eyes.)., Disp: , Rfl:  ?  Cholecalciferol (DIALYVITE VITAMIN D 5000) 125 MCG (5000 UT) capsule, Take 5,000 Units by mouth every evening., Disp: , Rfl:  ?  clopidogrel (PLAVIX) 75 MG tablet, TAKE 1 TABLET(75 MG) BY MOUTH DAILY, Disp: 90 tablet, Rfl: 1 ?  furosemide (LASIX) 20 MG tablet, Take 1 tablet (20 mg total) by mouth daily., Disp: 90 tablet, Rfl: 3 ?  levothyroxine (SYNTHROID) 137 MCG tablet, Take 137 mcg by mouth daily before breakfast., Disp: , Rfl:  ?  Magnesium 400 MG TABS, Take 400 mg by mouth every evening., Disp: , Rfl:  ?  nitroGLYCERIN (NITROSTAT) 0.4 MG SL tablet, Place 1 tablet (0.4 mg total) under the tongue every 5 (five) minutes as needed for chest pain., Disp: 30 tablet, Rfl: 6 ?  Rivaroxaban (XARELTO) 15 MG TABS tablet, TAKE 1 TABLET(15 MG) BY MOUTH DAILY WITH SUPPER, Disp: 90 tablet, Rfl: 1 ?  spironolactone (ALDACTONE) 25 MG tablet, Take 0.5 tablets (12.5 mg total) by mouth daily., Disp: 45 tablet, Rfl: 3 ? ?Past Medical History: ?Past Medical History:  ?Diagnosis Date  ? A-fib ([place])   ? Acute on chronic combined systolic and diastolic CHF (congestive heart failure)  ([place]) 08/22/2018  ? Acute on chronic respiratory failure ([place]) 04/15/2015  ? Anemia   ? Atrial flutter ([place])   ? Bradycardia   ? CAD in native artery 08/22/2018  ? CAP (community acquired pneumonia) 04/13/2015  ? See cxr 04/13/2015 > admit [facility]    ? Complication of anesthesia   ? H/O hiatal hernia   ? Hyperlipidemia LDL goal <70 08/22/2018  ? Hypertension 08/22/2018  ? Hypothyroidism 04/13/2015  ? Myasthenia gravis ([place]) 04/13/2015  ? Obesity 04/13/2015  ? Paroxysmal A-fib ([place]) 04/16/2015  ? RBBB   ? S/P angioplasty with stent 08/21/18 DES to RCA 08/22/2018  ? Sepsis ([place]) 04/15/2015  ? Thrombocytopenia ([place])   ? ? ?Tobacco Use: ?Social History  ? ?Tobacco Use  ?Smoking Status Former  ? Packs/day: 1.00  ? Years: 30.00  ? Pack years: 30.00  ? Types: Cigarettes  ? Quit date: 01/10/1982  ? Years since quitting: 40.1  ?Smokeless Tobacco Never  ?Tobacco Comments  ? Former smoker 10/17/2021  ? ? ?Labs: ?Review Flowsheet   ? ?  ?  Latest Ref Rng & Units 06/11/2012 08/21/2018 06/05/2019  ?Labs for ITP Cardiac and Pulmonary Rehab  ?Cholestrol 100 - 199 mg/dL  168   128    ?LDL (calc) 0 - 99 mg/dL  96   67    ?HDL-C >39 mg/dL  61   41    ?Trlycerides 0 - 149 mg/dL  56   109    ?  TCO2 0 - 100 mmol/L 19      ?  ? ? Multiple values from one day are sorted in reverse-chronological order  ?  ?  ? ? ?Capillary Blood Glucose: ?Lab Results  ?Component Value Date  ? GLUCAP 98 07/10/2013  ? ? ? ?Exercise Target Goals: ?Exercise Program Goal: ?Individual exercise prescription set using results from initial 6 min walk test and THRR while considering  patient?s activity barriers and safety.  ? ?Exercise Prescription Goal: ?Initial exercise prescription builds to 30-45 minutes a day of aerobic activity, 2-3 days per week.  Home exercise guidelines will be given to patient during program as part of exercise prescription that the participant will acknowledge. ? ?Activity Barriers & Risk Stratification: ? Activity Barriers & Cardiac Risk  Stratification - 01/04/22 1449   ? ?  ? Activity Barriers & Cardiac Risk Stratification  ? Activity Barriers Balance Concerns;Deconditioning;Muscular Weakness   ? Cardiac Risk Stratification High   ? ?  ?  ? ?  ? ? ?6 Minute Walk: ? 6 Minute Walk   ? ? [place] Name 01/04/22 1444  ?  ?  ?  ? 6 Minute Walk  ? Phase Initial    ? Distance 1138 feet    ? Walk Time 6 minutes    ? # of Rest Breaks 0    ? MPH 2.16    ? METS 1.44    ? RPE 13    ? Perceived Dyspnea  0    ? VO2 Peak 5.03    ? Symptoms Yes (comment)    ? Comments Headache at the 3.30 mark, pt wanted to continue, resolved by end of walk test    ? Resting HR 58 bpm    ? Resting BP 144/64    ? Resting Oxygen Saturation  99 %    ? Exercise Oxygen Saturation  during 6 min walk 100 %    ? Max Ex. HR 66 bpm    ? Max Ex. BP 128/62    ? 2 Minute Post BP 142/70    ? ?  ?  ? ?  ? ? ?Oxygen Initial Assessment: ? ? ?Oxygen Re-Evaluation: ? ? ?Oxygen Discharge (Final Oxygen Re-Evaluation): ? ? ?Initial Exercise Prescription: ? Initial Exercise Prescription - 01/04/22 1400   ? ?  ? Date of Initial Exercise RX and Referring Provider  ? Date 01/04/22   ? Referring Provider Dr. Janina Mayo, MD   ? Expected Discharge Date 03/02/22   ?  ? NuStep  ? Level 1   ? SPM 60   ? Minutes 15   ? METs 1.8   ?  ? Arm Ergometer  ? Level 1   ? Minutes 15   ? METs 1.8   ?  ? Prescription Details  ? Frequency (times per week) 3   ? Duration Progress to 30 minutes of continuous aerobic without signs/symptoms of physical distress   ?  ? Intensity  ? THRR 40-80% of Max Heartrate 53-106   ? Ratings of Perceived Exertion 11-13   ? Perceived Dyspnea 0-4   ?  ? Progression  ? Progression Continue progressive overload as per policy without signs/symptoms or physical distress.   ?  ? Resistance Training  ? Training Prescription Yes   ? Weight 2lbs   ? Reps 10-15   ? ?  ?  ? ?  ? ? ?Perform Capillary Blood Glucose checks as needed. ? ?Exercise Prescription Changes: ? ? Exercise  Prescription Changes   ? ? [name]  Name 01/08/22 0708 01/22/22 0830 02/02/22 1632  ?  ?  ?  ? Response to Exercise  ? Blood Pressure (Admit) 158/70 140/70 140/70    ? Blood Pressure (Exercise) 138/68 142/66 158/78    ? Blood Pressure (Exit) 122/70 140/70 138/80    ? Heart Rate (Admit) 68 bpm 60 bpm 70 bpm    ? Heart Rate (Exercise) 74 bpm 79 bpm 70 bpm    ? Heart Rate (Exit) 37 bpm 70 bpm 71 bpm    ? Rating of Perceived Exertion (Exercise) _0 ? Symptoms None None None    ? Comments Off to a good start with exercise. Reviewed MET's, goals and home ExRx Reviewed MET's, goals and home ExRx    ? Duration Continue with 30 min of aerobic exercise without signs/symptoms of physical distress. Continue with 30 min of aerobic exercise without signs/symptoms of physical distress. Continue with 30 min of aerobic exercise without signs/symptoms of physical distress.    ? Intensity THRR unchanged THRR unchanged THRR unchanged    ?  ? Progression  ? Progression Continue to progress workloads to maintain intensity without signs/symptoms of physical distress. Continue to progress workloads to maintain intensity without signs/symptoms of physical distress. Continue to progress workloads to maintain intensity without signs/symptoms of physical distress.    ? Average METs 1.5 2 2.05    ?  ? Resistance Training  ? Training Prescription Yes Yes Yes    ? Weight 2lbs 3 lbs wts 3 lbs wts    ? Reps 10-15 10-15 10-15    ? Time 10 Minutes 10 Minutes 10 Minutes    ?  ? Interval Training  ? Interval Training No No No    ?  ? NuStep  ? Level _1 ? SPM 60 60 60    ? Minutes _2 ? METs 1.9 2.1 2.2    ?  ? Arm Ergometer  ? Level _3 ? Watts _4 ? Minutes _5 ? METs 1.07 1.9 1.9    ?  ? Home Exercise Plan  ? Plans to continue exercise at -- Home (comment) Home (comment)    ? Frequency -- Add 1 additional day to program exercise sessions. Add 1 additional day to program exercise sessions.    ? Initial Home Exercises Provided -- 01/22/22 01/22/22     ? ?  ?  ? ?  ? ? ?Exercise Comments: ? ? Exercise Comments   ? ? [place] Name 01/08/22 7619 01/22/22 0830 02/02/22 1635  ?  ?  ? Exercise Comments Patient tolerated low intensity exercise without symptoms. Review

## 2022-02-07 ENCOUNTER — Encounter (HOSPITAL_COMMUNITY)
Admission: RE | Admit: 2022-02-07 | Discharge: 2022-02-07 | Disposition: A | Discharge status: Home / Self Care | Payer: Medicare Other | Source: Ambulatory Visit | Attending: Cardiology | Admitting: Cardiology

## 2022-02-07 DIAGNOSIS — Z955 Presence of coronary angioplasty implant and graft: Secondary | ICD-10-CM

## 2022-02-07 DIAGNOSIS — Z48812 Encounter for surgical aftercare following surgery on the circulatory system: Secondary | ICD-10-CM | POA: Diagnosis not present

## 2022-02-09 ENCOUNTER — Encounter (HOSPITAL_COMMUNITY)
Admission: RE | Admit: 2022-02-09 | Discharge: 2022-02-09 | Disposition: A | Discharge status: Home / Self Care | Payer: Medicare Other | Source: Ambulatory Visit | Attending: Cardiology | Admitting: Cardiology

## 2022-02-09 DIAGNOSIS — Z48812 Encounter for surgical aftercare following surgery on the circulatory system: Secondary | ICD-10-CM | POA: Diagnosis not present

## 2022-02-09 DIAGNOSIS — Z955 Presence of coronary angioplasty implant and graft: Secondary | ICD-10-CM

## 2022-02-12 ENCOUNTER — Encounter (HOSPITAL_COMMUNITY)
Admission: RE | Admit: 2022-02-12 | Discharge: 2022-02-12 | Disposition: A | Discharge status: Home / Self Care | Payer: Medicare Other | Source: Ambulatory Visit | Attending: Cardiology | Admitting: Cardiology

## 2022-02-12 DIAGNOSIS — Z48812 Encounter for surgical aftercare following surgery on the circulatory system: Secondary | ICD-10-CM | POA: Diagnosis not present

## 2022-02-12 DIAGNOSIS — Z955 Presence of coronary angioplasty implant and graft: Secondary | ICD-10-CM | POA: Diagnosis not present

## 2022-02-14 ENCOUNTER — Encounter (HOSPITAL_COMMUNITY)
Admission: RE | Admit: 2022-02-14 | Discharge: 2022-02-14 | Disposition: A | Discharge status: Home / Self Care | Payer: Medicare Other | Source: Ambulatory Visit | Attending: Cardiology | Admitting: Cardiology

## 2022-02-14 DIAGNOSIS — Z955 Presence of coronary angioplasty implant and graft: Secondary | ICD-10-CM

## 2022-02-14 DIAGNOSIS — Z48812 Encounter for surgical aftercare following surgery on the circulatory system: Secondary | ICD-10-CM | POA: Diagnosis not present

## 2022-02-16 ENCOUNTER — Encounter (HOSPITAL_COMMUNITY)
Admission: RE | Admit: 2022-02-16 | Discharge: 2022-02-16 | Disposition: A | Discharge status: Home / Self Care | Payer: Medicare Other | Source: Ambulatory Visit | Attending: Cardiology | Admitting: Cardiology

## 2022-02-16 DIAGNOSIS — Z955 Presence of coronary angioplasty implant and graft: Secondary | ICD-10-CM | POA: Diagnosis not present

## 2022-02-16 DIAGNOSIS — Z48812 Encounter for surgical aftercare following surgery on the circulatory system: Secondary | ICD-10-CM | POA: Diagnosis not present

## 2022-02-19 ENCOUNTER — Encounter (HOSPITAL_COMMUNITY)
Admission: RE | Admit: 2022-02-19 | Discharge: 2022-02-19 | Disposition: A | Discharge status: Home / Self Care | Payer: Medicare Other | Source: Ambulatory Visit | Attending: Cardiology | Admitting: Cardiology

## 2022-02-19 DIAGNOSIS — Z955 Presence of coronary angioplasty implant and graft: Secondary | ICD-10-CM

## 2022-02-19 DIAGNOSIS — Z48812 Encounter for surgical aftercare following surgery on the circulatory system: Secondary | ICD-10-CM | POA: Diagnosis not present

## 2022-02-21 ENCOUNTER — Encounter (HOSPITAL_COMMUNITY)
Admission: RE | Admit: 2022-02-21 | Discharge: 2022-02-21 | Disposition: A | Discharge status: Home / Self Care | Payer: Medicare Other | Source: Ambulatory Visit | Attending: Cardiology | Admitting: Cardiology

## 2022-02-21 DIAGNOSIS — Z955 Presence of coronary angioplasty implant and graft: Secondary | ICD-10-CM | POA: Diagnosis not present

## 2022-02-21 DIAGNOSIS — Z48812 Encounter for surgical aftercare following surgery on the circulatory system: Secondary | ICD-10-CM | POA: Diagnosis not present

## 2022-02-23 ENCOUNTER — Encounter (HOSPITAL_COMMUNITY)
Admission: RE | Admit: 2022-02-23 | Discharge: 2022-02-23 | Disposition: A | Discharge status: Home / Self Care | Payer: Medicare Other | Source: Ambulatory Visit | Attending: Cardiology | Admitting: Cardiology

## 2022-02-23 DIAGNOSIS — Z955 Presence of coronary angioplasty implant and graft: Secondary | ICD-10-CM | POA: Diagnosis not present

## 2022-02-23 DIAGNOSIS — Z48812 Encounter for surgical aftercare following surgery on the circulatory system: Secondary | ICD-10-CM | POA: Diagnosis not present

## 2022-02-28 ENCOUNTER — Encounter (HOSPITAL_COMMUNITY)
Admission: RE | Admit: 2022-02-28 | Discharge: 2022-02-28 | Disposition: A | Discharge status: Home / Self Care | Payer: Medicare Other | Source: Ambulatory Visit | Attending: Cardiology | Admitting: Cardiology

## 2022-02-28 DIAGNOSIS — Z48812 Encounter for surgical aftercare following surgery on the circulatory system: Secondary | ICD-10-CM | POA: Diagnosis not present

## 2022-02-28 DIAGNOSIS — Z955 Presence of coronary angioplasty implant and graft: Secondary | ICD-10-CM | POA: Diagnosis not present

## 2022-03-02 ENCOUNTER — Encounter (HOSPITAL_COMMUNITY)
Admission: RE | Admit: 2022-03-02 | Discharge: 2022-03-02 | Disposition: A | Discharge status: Home / Self Care | Payer: Medicare Other | Source: Ambulatory Visit | Attending: Cardiology | Admitting: Cardiology

## 2022-03-02 VITALS — Ht 66.5 in | Wt 156.1 lb

## 2022-03-02 DIAGNOSIS — Z955 Presence of coronary angioplasty implant and graft: Secondary | ICD-10-CM | POA: Insufficient documentation

## 2022-03-05 ENCOUNTER — Encounter (HOSPITAL_COMMUNITY)
Admission: RE | Admit: 2022-03-05 | Discharge: 2022-03-05 | Disposition: A | Discharge status: Home / Self Care | Payer: Medicare Other | Source: Ambulatory Visit | Attending: Cardiology | Admitting: Cardiology

## 2022-03-05 DIAGNOSIS — Z955 Presence of coronary angioplasty implant and graft: Secondary | ICD-10-CM

## 2022-03-06 NOTE — Progress Notes (Signed)
Cardiac Individual Treatment Plan  Patient Details  Name: Marcus [name]. MRN: [id number] Date of Birth: Sep 24, 1932 Referring Provider:   Flowsheet Row CARDIAC REHAB PHASE II ORIENTATION from 01/04/2022 in [facility]  Referring Provider Dr. Janina Mayo, MD       Initial Encounter Date:  [place] from 01/04/2022 in [facility]  Date 01/04/22       Visit Diagnosis: 02/29/23 S/ P DES Prox LAD  Patient's Home Medications on Admission:  Current Outpatient Medications:    amLODipine (NORVASC) 10 MG tablet, Take 1 tablet (10 mg total) by mouth daily., Disp: 90 tablet, Rfl: 1   Carboxymethylcellulose Sodium (THERATEARS) 0.25 % SOLN, Place 1-2 drops into both eyes 3 (three) times daily as needed (dry/irritated eyes.)., Disp: , Rfl:    Cholecalciferol (DIALYVITE VITAMIN D 5000) 125 MCG (5000 UT) capsule, Take 5,000 Units by mouth every evening., Disp: , Rfl:    clopidogrel (PLAVIX) 75 MG tablet, TAKE 1 TABLET(75 MG) BY MOUTH DAILY, Disp: 90 tablet, Rfl: 1   furosemide (LASIX) 20 MG tablet, Take 1 tablet (20 mg total) by mouth daily., Disp: 90 tablet, Rfl: 3   levothyroxine (SYNTHROID) 137 MCG tablet, Take 137 mcg by mouth daily before breakfast., Disp: , Rfl:    Magnesium 400 MG TABS, Take 400 mg by mouth every evening., Disp: , Rfl:    nitroGLYCERIN (NITROSTAT) 0.4 MG SL tablet, Place 1 tablet (0.4 mg total) under the tongue every 5 (five) minutes as needed for chest pain., Disp: 30 tablet, Rfl: 6   Rivaroxaban (XARELTO) 15 MG TABS tablet, TAKE 1 TABLET(15 MG) BY MOUTH DAILY WITH SUPPER, Disp: 90 tablet, Rfl: 1   spironolactone (ALDACTONE) 25 MG tablet, Take 0.5 tablets (12.5 mg total) by mouth daily., Disp: 45 tablet, Rfl: 3  Past Medical History: Past Medical History:  Diagnosis Date   A-fib ([place])    Acute on chronic combined systolic and diastolic CHF (congestive heart failure)  ([place]) 08/22/2018   Acute on chronic respiratory failure ([place]) 04/15/2015   Anemia    Atrial flutter ([facility])    Bradycardia    CAD in native artery 08/22/2018   CAP (community acquired pneumonia) 04/13/2015   See cxr 04/13/2015 > admit [facility]     Complication of anesthesia    H/O hiatal hernia    Hyperlipidemia LDL goal <70 08/22/2018   Hypertension 08/22/2018   Hypothyroidism 04/13/2015   Myasthenia gravis ([place]) 04/13/2015   Obesity 04/13/2015   Paroxysmal A-fib ([place]) 04/16/2015   RBBB    S/P angioplasty with stent 08/21/18 DES to RCA 08/22/2018   Sepsis ([place]) 04/15/2015   Thrombocytopenia ([place])     Tobacco Use: Social History   Tobacco Use  Smoking Status Former   Packs/day: 1.00   Years: 30.00   Pack years: 30.00   Types: Cigarettes   Quit date: 01/10/1982   Years since quitting: 40.1  Smokeless Tobacco Never  Tobacco Comments   Former smoker 10/17/2021    Labs: Review Flowsheet        Latest Ref Rng & Units 06/11/2012 08/21/2018 06/05/2019  Labs for ITP Cardiac and Pulmonary Rehab  Cholestrol 100 - 199 mg/dL  168   128    LDL (calc) 0 - 99 mg/dL  96   67    HDL-C >39 mg/dL  61   41    Trlycerides 0 - 149 mg/dL  56   109  TCO2 0 - 100 mmol/L 19              Capillary Blood Glucose: Lab Results  Component Value Date   GLUCAP 98 07/10/2013     Exercise Target Goals: Exercise Program Goal: Individual exercise prescription set using results from initial 6 min walk test and THRR while considering  patient's activity barriers and safety.   Exercise Prescription Goal: Initial exercise prescription builds to 30-45 minutes a day of aerobic activity, 2-3 days per week.  Home exercise guidelines will be given to patient during program as part of exercise prescription that the participant will acknowledge.  Activity Barriers & Risk Stratification:  Activity Barriers & Cardiac Risk Stratification - 01/04/22 1449       Activity Barriers & Cardiac Risk  Stratification   Activity Barriers Balance Concerns;Deconditioning;Muscular Weakness    Cardiac Risk Stratification High             6 Minute Walk:  6 Minute Walk     Row Name 01/04/22 1444 02/28/22 0830       6 Minute Walk   Phase Initial Discharge    Distance 1138 feet 1353 feet    Distance % Change -- 18.89 %    Distance Feet Change -- 215 ft    Walk Time 6 minutes 6 minutes    # of Rest Breaks 0 0    MPH 2.16 2.56    METS 1.44 1.91    RPE 13 12    Perceived Dyspnea  0 0    VO2 Peak 5.03 6.69    Symptoms Yes (comment) Yes (comment)    Comments Headache at the 3.30 mark, pt wanted to continue, resolved by end of walk test Bilateral calf pain 2/10 relieved with rest    Resting HR 58 bpm 71 bpm    Resting BP 144/64 126/60    Resting Oxygen Saturation  99 % 97 %    Exercise Oxygen Saturation  during 6 min walk 100 % 97 %    Max Ex. HR 66 bpm 70 bpm    Max Ex. BP 128/62 130/62    2 Minute Post BP 142/70 104/70             Oxygen Initial Assessment:   Oxygen Re-Evaluation:   Oxygen Discharge (Final Oxygen Re-Evaluation):   Initial Exercise Prescription:  Initial Exercise Prescription - 01/04/22 1400       Date of Initial Exercise RX and Referring Provider   Date 01/04/22    Referring Provider Dr. Janina Mayo, MD    Expected Discharge Date 03/02/22      NuStep   Level 1    SPM 60    Minutes 15    METs 1.8      Arm Ergometer   Level 1    Minutes 15    METs 1.8      Prescription Details   Frequency (times per week) 3    Duration Progress to 30 minutes of continuous aerobic without signs/symptoms of physical distress      Intensity   THRR 40-80% of Max Heartrate 53-106    Ratings of Perceived Exertion 11-13    Perceived Dyspnea 0-4      Progression   Progression Continue progressive overload as per policy without signs/symptoms or physical distress.      Resistance Training   Training Prescription Yes    Weight 2lbs    Reps 10-15  Perform Capillary Blood Glucose checks as needed.  Exercise Prescription Changes:   Exercise Prescription Changes     Row Name 01/08/22 0708 01/22/22 0830 02/02/22 1632         Response to Exercise   Blood Pressure (Admit) 158/70 140/70 140/70     Blood Pressure (Exercise) 138/68 142/66 158/78     Blood Pressure (Exit) 122/70 140/70 138/80     Heart Rate (Admit) 68 bpm 60 bpm 70 bpm     Heart Rate (Exercise) 74 bpm 79 bpm 70 bpm     Heart Rate (Exit) 37 bpm 70 bpm 71 bpm     Rating of Perceived Exertion (Exercise) 12 12 12      Symptoms None None None     Comments Off to a good start with exercise. Reviewed MET's, goals and home ExRx Reviewed MET's, goals and home ExRx     Duration Continue with 30 min of aerobic exercise without signs/symptoms of physical distress. Continue with 30 min of aerobic exercise without signs/symptoms of physical distress. Continue with 30 min of aerobic exercise without signs/symptoms of physical distress.     Intensity THRR unchanged THRR unchanged THRR unchanged       Progression   Progression Continue to progress workloads to maintain intensity without signs/symptoms of physical distress. Continue to progress workloads to maintain intensity without signs/symptoms of physical distress. Continue to progress workloads to maintain intensity without signs/symptoms of physical distress.     Average METs 1.5 2 2.05       Resistance Training   Training Prescription Yes Yes Yes     Weight 2lbs 3 lbs wts 3 lbs wts     Reps 10-15 10-15 10-15     Time 10 Minutes 10 Minutes 10 Minutes       Interval Training   Interval Training No No No       NuStep   Level 1 2 3      SPM 60 60 60     Minutes 15 15 15      METs 1.9 2.1 2.2       Arm Ergometer   Level 1 1 1      Watts 1 1 1      Minutes 15 15 15      METs 1.07 1.9 1.9       Home Exercise Plan   Plans to continue exercise at -- Home (comment) Home (comment)     Frequency -- Add 1  additional day to program exercise sessions. Add 1 additional day to program exercise sessions.     Initial Home Exercises Provided -- 01/22/22 01/22/22              Exercise Comments:   Exercise Comments     Row Name 01/08/22 5974 01/22/22 0830 02/02/22 1635       Exercise Comments Patient tolerated low intensity exercise without symptoms. Reviewed MET's, goals and home ExRx. Pt is tolerating exercise well with an average MET level of 2.0. Pt is interested in going to [place] when he graduates, but until then he walks at [employer] with a cart for 20-30 minutes 1-2 days a week. Reviewed MET's. Pt is tolerating exercise well with an average MET level of 2.05. Pt is working very hard and he is begining to feel better              Exercise Goals and Review:   Exercise Goals     Row Name 01/04/22 1453  Exercise Goals   Increase Physical Activity Yes       Intervention Provide advice, education, support and counseling about physical activity/exercise needs.;Develop an individualized exercise prescription for aerobic and resistive training based on initial evaluation findings, risk stratification, comorbidities and participant's personal goals.       Expected Outcomes Short Term: Attend rehab on a regular basis to increase amount of physical activity.;Long Term: Exercising regularly at least 3-5 days a week.;Long Term: Add in home exercise to make exercise part of routine and to increase amount of physical activity.       Increase Strength and Stamina Yes       Intervention Provide advice, education, support and counseling about physical activity/exercise needs.;Develop an individualized exercise prescription for aerobic and resistive training based on initial evaluation findings, risk stratification, comorbidities and participant's personal goals.       Expected Outcomes Short Term: Increase workloads from initial exercise prescription for resistance, speed, and  METs.;Short Term: Perform resistance training exercises routinely during rehab and add in resistance training at home;Long Term: Improve cardiorespiratory fitness, muscular endurance and strength as measured by increased METs and functional capacity (6MWT)       Able to understand and use rate of perceived exertion (RPE) scale Yes       Intervention Provide education and explanation on how to use RPE scale       Expected Outcomes Short Term: Able to use RPE daily in rehab to express subjective intensity level;Long Term:  Able to use RPE to guide intensity level when exercising independently       Knowledge and understanding of Target Heart Rate Range (THRR) Yes       Intervention Provide education and explanation of THRR including how the numbers were predicted and where they are located for reference       Expected Outcomes Short Term: Able to state/look up THRR;Long Term: Able to use THRR to govern intensity when exercising independently;Short Term: Able to use daily as guideline for intensity in rehab       Understanding of Exercise Prescription Yes       Intervention Provide education, explanation, and written materials on patient's individual exercise prescription       Expected Outcomes Short Term: Able to explain program exercise prescription;Long Term: Able to explain home exercise prescription to exercise independently                Exercise Goals Re-Evaluation :  Exercise Goals Re-Evaluation     Row Name 01/08/22 0811 01/22/22 0830           Exercise Goal Re-Evaluation   Exercise Goals Review Increase Physical Activity;Able to understand and use rate of perceived exertion (RPE) scale Increase Physical Activity;Able to understand and use rate of perceived exertion (RPE) scale;Knowledge and understanding of Target Heart Rate Range (THRR);Understanding of Exercise Prescription;Increase Strength and Stamina      Comments Patient able to understand and use RPE scale appropriately.  Reviewed MET's, goals and home ExRx. Pt is tolerating exercise well with an average MET level of 2.0. Pt is interested in going to [place] when he graduates, but until then [place] [employer] with a cart for 20-30 minutes 1-2 days a week.      Expected Outcomes Progress workloads as tolerated to help increase cardiorespiratory fitness. Will continue to monitor pt and progress workloads as tolerated without sign or symptom.               Discharge Exercise  Prescription (Final Exercise Prescription Changes):  Exercise Prescription Changes - 02/02/22 1632       Response to Exercise   Blood Pressure (Admit) 140/70    Blood Pressure (Exercise) 158/78    Blood Pressure (Exit) 138/80    Heart Rate (Admit) 70 bpm    Heart Rate (Exercise) 70 bpm    Heart Rate (Exit) 71 bpm    Rating of Perceived Exertion (Exercise) 12    Symptoms None    Comments Reviewed MET's, goals and home ExRx    Duration Continue with 30 min of aerobic exercise without signs/symptoms of physical distress.    Intensity THRR unchanged      Progression   Progression Continue to progress workloads to maintain intensity without signs/symptoms of physical distress.    Average METs 2.05      Resistance Training   Training Prescription Yes    Weight 3 lbs wts    Reps 10-15    Time 10 Minutes      Interval Training   Interval Training No      NuStep   Level 3    SPM 60    Minutes 15    METs 2.2      Arm Ergometer   Level 1    Watts 1    Minutes 15    METs 1.9      Home Exercise Plan   Plans to continue exercise at Home (comment)    Frequency Add 1 additional day to program exercise sessions.    Initial Home Exercises Provided 01/22/22             Nutrition:  Target Goals: Understanding of nutrition guidelines, daily intake of sodium <1533m, cholesterol <2035m calories 30% from fat and 7% or less from saturated fats, daily to have 5 or more servings of fruits and vegetables.  Biometrics:  Pre  Biometrics - 01/04/22 1442       Pre Biometrics   Waist Circumference 38.25 inches    Hip Circumference 40.25 inches    Waist to Hip Ratio 0.95 %    Triceps Skinfold 7 mm    % Body Fat 23.8 %    Grip Strength 35 kg    Flexibility --   Pt unable to reach   Single Leg Stand 4.7 seconds             Post Biometrics - 03/02/22 0830        Post  Biometrics   Height 5' 6.5" (1.689 m)    Weight 156 lb 1.4 oz (70.8 kg)    Waist Circumference 36.5 inches    Hip Circumference 38.5 inches    Waist to Hip Ratio 0.95 %    BMI (Calculated) 24.82    Triceps Skinfold 11 mm    % Body Fat 24.4 %    Grip Strength 35 kg    Single Leg Stand 2 seconds             Nutrition Therapy Plan and Nutrition Goals:  Nutrition Therapy & Goals - 01/08/22 0757       Nutrition Therapy   Diet Heart Healthy Diet      Personal Nutrition Goals   Nutrition Goal Patient to adhere to a heart healthy diet to include lean protein/plant protein, fruits/vegetables, low fat dairy, nuts/seeds, and whole grains.    Personal Goal #2 Patient to identify and limit food sources of saturated fats, trans fats, refined carbohydrates, and sodium    Personal Goal #3  Patient to decrease sodium intake to <2,381m per day.    Comments Patient declined RD assistance/intervention at orientation. Reports following a vegan diet; however, he does eat fist 1-2x/month. Reports significant lentil/legume intake.      Intervention Plan   Intervention --   Patient declined RD assistance/intervention at orientation.   Expected Outcomes Long Term Goal: Adherence to prescribed nutrition plan.             Nutrition Assessments:  Nutrition Assessments - 03/02/22 0801       Rate Your Plate Scores   Post Score 70            MEDIFICTS Score Key: ?70 Need to make dietary changes  40-70 Heart Healthy Diet ? 40 Therapeutic Level Cholesterol Diet   Flowsheet Row CARDIAC REHAB PHASE II EXERCISE from 03/02/2022 in [facility] Picture Your Plate Total Score on Discharge 70      Picture Your Plate Scores: <<47Unhealthy dietary pattern with much room for improvement. 41-50 Dietary pattern unlikely to meet recommendations for good health and room for improvement. 51-60 More healthful dietary pattern, with some room for improvement.  >60 Healthy dietary pattern, although there may be some specific behaviors that could be improved.    Nutrition Goals Re-Evaluation:   Nutrition Goals Re-Evaluation:   Nutrition Goals Discharge (Final Nutrition Goals Re-Evaluation):   Psychosocial: Target Goals: Acknowledge presence or absence of significant depression and/or stress, maximize coping skills, provide positive support system. Participant is able to verbalize types and ability to use techniques and skills needed for reducing stress and depression.  Initial Review & Psychosocial Screening:  Initial Psych Review & Screening - 01/04/22 1637       Initial Review   Current issues with None Identified      Family Dynamics   Good Support System? Yes   Makaio lives alone he has two children who live in the area     Barriers   Psychosocial barriers to participate in program There are no identifiable barriers or psychosocial needs.      Screening Interventions   Interventions Encouraged to exercise             Quality of Life Scores:  Quality of Life - 03/02/22 0830       Quality of Life   Select Quality of Life      Quality of Life Scores   Health/Function Post 19.64 %    Socioeconomic Post 22.9 %    Psych/Spiritual Post 20.86 %    Family Post 26.63 %    GLOBAL Post 21.4 %            Scores of 19 and below usually indicate a poorer quality of life in these areas.  A difference of  2-3 points is a clinically meaningful difference.  A difference of 2-3 points in the total score of the Quality of Life Index has been associated with significant improvement in  overall quality of life, self-image, physical symptoms, and general health in studies assessing change in quality of life.  PHQ-9: Review Flowsheet        01/04/2022 07/22/2019 11/19/2018  Depression screen PHQ 2/9  Decreased Interest 0 0 0  Down, Depressed, Hopeless 0 0 0  PHQ - 2 Score 0 0 0         Interpretation of Total Score  Total Score Depression Severity:  1-4 = Minimal depression, 5-9 = Mild depression, 10-14 = Moderate depression, 15-19 =  Moderately severe depression, 20-27 = Severe depression   Psychosocial Evaluation and Intervention:  Psychosocial Evaluation - 03/06/22 1504       Psychosocial Evaluation & Interventions   Interventions Encouraged to exercise with the program and follow exercise prescription    Comments Quitman denies any psychosocial barriers to adopting heart healthy lifestyle. Displays positive and healthy outlook. Deavin is a joy to work with.    Expected Outcomes Geza will deny any psychosocial barriers and continue to display positive and healthy outlook    Continue Psychosocial Services  No Follow up required             Psychosocial Re-Evaluation:  Psychosocial Re-Evaluation     [place] Name 01/09/22 2025 02/06/22 1230 03/07/22 1639         Psychosocial Re-Evaluation   Current issues with None Identified None Identified None Identified     Comments Claris started Cardiac Rehab on 4/10.  Pt reports no psychosocial barriers. Saamir  reports no psychosocial barriers and displays a positive outlook.  Jamarques enjoys coming to exercise and talking with staff as well as participants.  Often brings snacks for the staff to enjoy. Karma  reports no psychosocial barriers and displays a positive outlook.  Peyson enjoys coming to exercise and talking with staff as well as participants.  Often brings snacks for the staff to enjoy.     Expected Outcomes Jhonathan will be free of any psychosocial barriers and demonstrate hopeful outlook. Ewart will  be free of any psychosocial barriers and demonstrate hopeful outlook. Adams will be free of any psychosocial barriers and demonstrate hopeful outlook.     Interventions Encouraged to attend Cardiac Rehabilitation for the exercise Encouraged to attend Cardiac Rehabilitation for the exercise Encouraged to attend Cardiac Rehabilitation for the exercise     Continue Psychosocial Services  No Follow up required No Follow up required No Follow up required              Psychosocial Discharge (Final Psychosocial Re-Evaluation):  Psychosocial Re-Evaluation - 03/07/22 1639       Psychosocial Re-Evaluation   Current issues with None Identified    Comments Heston  reports no psychosocial barriers and displays a positive outlook.  Aster enjoys coming to exercise and talking with staff as well as participants.  Often brings snacks for the staff to enjoy.    Expected Outcomes Virlan will be free of any psychosocial barriers and demonstrate hopeful outlook.    Interventions Encouraged to attend Cardiac Rehabilitation for the exercise    Continue Psychosocial Services  No Follow up required             Vocational Rehabilitation: Provide vocational rehab assistance to qualifying candidates.   Vocational Rehab Evaluation & Intervention:  Vocational Rehab - 01/04/22 1638       Initial Vocational Rehab Evaluation & Intervention   Assessment shows need for Vocational Rehabilitation No   Jaedin is retired and does not need vocational rehab at this time            Education: Education Goals: Education classes will be provided on a weekly basis, covering required topics. Participant will state understanding/return demonstration of topics presented.  Learning Barriers/Preferences:  Learning Barriers/Preferences - 01/04/22 1500       Learning Barriers/Preferences   Learning Barriers Sight;Hearing;Exercise Concerns   Pt wears glasses, pt HOH, balance concerns   Learning Preferences  Pictoral;Written Material;Computer/Internet             Education Topics: Count Your  Pulse:  -Group instruction provided by verbal instruction, demonstration, patient participation and written materials to support subject.  Instructors address importance of being able to find your pulse and how to count your pulse when at home without a heart monitor.  Patients get hands on experience counting their pulse with staff help and individually.   Heart Attack, Angina, and Risk Factor Modification:  -Group instruction provided by verbal instruction, video, and written materials to support subject.  Instructors address signs and symptoms of angina and heart attacks.    Also discuss risk factors for heart disease and how to make changes to improve heart health risk factors.   Functional Fitness:  -Group instruction provided by verbal instruction, demonstration, patient participation, and written materials to support subject.  Instructors address safety measures for doing things around the house.  Discuss how to get up and down off the floor, how to pick things up properly, how to safely get out of a chair without assistance, and balance training.   Meditation and Mindfulness:  -Group instruction provided by verbal instruction, patient participation, and written materials to support subject.  Instructor addresses importance of mindfulness and meditation practice to help reduce stress and improve awareness.  Instructor also leads participants through a meditation exercise.    Stretching for Flexibility and Mobility:  -Group instruction provided by verbal instruction, patient participation, and written materials to support subject.  Instructors lead participants through series of stretches that are designed to increase flexibility thus improving mobility.  These stretches are additional exercise for major muscle groups that are typically performed during regular warm up and cool down.   Hands Only  CPR:  -Group verbal, video, and participation provides a basic overview of AHA guidelines for community CPR. Role-play of emergencies allow participants the opportunity to practice calling for help and chest compression technique with discussion of AED use.   Hypertension: -Group verbal and written instruction that provides a basic overview of hypertension including the most recent diagnostic guidelines, risk factor reduction with self-care instructions and medication management.    Nutrition I class: Heart Healthy Eating:  -Group instruction provided by PowerPoint slides, verbal discussion, and written materials to support subject matter. The instructor gives an explanation and review of the Therapeutic Lifestyle Changes diet recommendations, which includes a discussion on lipid goals, dietary fat, sodium, fiber, plant stanol/sterol esters, sugar, and the components of a well-balanced, healthy diet.   Nutrition II class: Lifestyle Skills:  -Group instruction provided by PowerPoint slides, verbal discussion, and written materials to support subject matter. The instructor gives an explanation and review of label reading, grocery shopping for heart health, heart healthy recipe modifications, and ways to make healthier choices when eating out.   Diabetes Question & Answer:  -Group instruction provided by PowerPoint slides, verbal discussion, and written materials to support subject matter. The instructor gives an explanation and review of diabetes co-morbidities, pre- and post-prandial blood glucose goals, pre-exercise blood glucose goals, signs, symptoms, and treatment of hypoglycemia and hyperglycemia, and foot care basics.   Diabetes Blitz:  -Group instruction provided by PowerPoint slides, verbal discussion, and written materials to support subject matter. The instructor gives an explanation and review of the physiology behind type 1 and type 2 diabetes, diabetes medications and rational  behind using different medications, pre- and post-prandial blood glucose recommendations and Hemoglobin A1c goals, diabetes diet, and exercise including blood glucose guidelines for exercising safely.    Portion Distortion:  -Group instruction provided by PowerPoint slides, verbal discussion, written materials,  and food models to support subject matter. The instructor gives an explanation of serving size versus portion size, changes in portions sizes over the last 20 years, and what consists of a serving from each food group.   Stress Management:  -Group instruction provided by verbal instruction, video, and written materials to support subject matter.  Instructors review role of stress in heart disease and how to cope with stress positively.     Exercising on Your Own:  -Group instruction provided by verbal instruction, power point, and written materials to support subject.  Instructors discuss benefits of exercise, components of exercise, frequency and intensity of exercise, and end points for exercise.  Also discuss use of nitroglycerin and activating EMS.  Review options of places to exercise outside of rehab.  Review guidelines for sex with heart disease.   Cardiac Drugs I:  -Group instruction provided by verbal instruction and written materials to support subject.  Instructor reviews cardiac drug classes: antiplatelets, anticoagulants, beta blockers, and statins.  Instructor discusses reasons, side effects, and lifestyle considerations for each drug class.   Cardiac Drugs II:  -Group instruction provided by verbal instruction and written materials to support subject.  Instructor reviews cardiac drug classes: angiotensin converting enzyme inhibitors (ACE-I), angiotensin II receptor blockers (ARBs), nitrates, and calcium channel blockers.  Instructor discusses reasons, side effects, and lifestyle considerations for each drug class.   Anatomy and Physiology of the Circulatory System:   Group verbal and written instruction and models provide basic cardiac anatomy and physiology, with the coronary electrical and arterial systems. Review of: AMI, Angina, Valve disease, Heart Failure, Peripheral Artery Disease, Cardiac Arrhythmia, Pacemakers, and the ICD.   Other Education:  -Group or individual verbal, written, or video instructions that support the educational goals of the cardiac rehab program.   Holiday Eating Survival Tips:  -Group instruction provided by PowerPoint slides, verbal discussion, and written materials to support subject matter. The instructor gives patients tips, tricks, and techniques to help them not only survive but enjoy the holidays despite the onslaught of food that accompanies the holidays.   Knowledge Questionnaire Score:  Knowledge Questionnaire Score - 03/02/22 0830       Knowledge Questionnaire Score   Post Score 21/24             Core Components/Risk Factors/Patient Goals at Admission:  Personal Goals and Risk Factors at Admission - 01/04/22 1503       Core Components/Risk Factors/Patient Goals on Admission   Hypertension Yes    Intervention Provide education on lifestyle modifcations including regular physical activity/exercise, weight management, moderate sodium restriction and increased consumption of fresh fruit, vegetables, and low fat dairy, alcohol moderation, and smoking cessation.;Monitor prescription use compliance.    Expected Outcomes Short Term: Continued assessment and intervention until BP is < 140/58m HG in hypertensive participants. < 130/844mHG in hypertensive participants with diabetes, heart failure or chronic kidney disease.;Long Term: Maintenance of blood pressure at goal levels.    Lipids Yes    Intervention Provide education and support for participant on nutrition & aerobic/resistive exercise along with prescribed medications to achieve LDL <7065mHDL >63m66m  Expected Outcomes Short Term: Participant states  understanding of desired cholesterol values and is compliant with medications prescribed. Participant is following exercise prescription and nutrition guidelines.;Long Term: Cholesterol controlled with medications as prescribed, with individualized exercise RX and with personalized nutrition plan. Value goals: LDL < 70mg42mL > 40 mg.    Personal Goal Other Yes  Personal Goal Long and short the same: gain upper body strength, and strength    Intervention Will continue to monitor pt and progress pt as tolerated without sign or symptom    Expected Outcomes Pt will achieve his goals             Core Components/Risk Factors/Patient Goals Review:   Goals and Risk Factor Review     Row Name 01/09/22 2029 02/06/22 1233 03/06/22 1511         Core Components/Risk Factors/Patient Goals Review   Personal Goals Review Weight Management/Obesity;Lipids;Hypertension Weight Management/Obesity;Lipids;Hypertension Weight Management/Obesity;Lipids;Hypertension     Review Kapil started Cardiac rehab on 4/10.  Anticipate progress toward program goals with the next 30 day review. Burton is off to a great start in cardiac rehab.  Vital signs show borderline high blood pressure pre exercise however post exercise bp are generally within acceptable range.  Kellon adheres to heart healthy diet within the boundaries of someone his age. Cheston is showing improved upper body strength as evident by the increased MET level on the arm ergometer.  Weight loss negligible from the beginning of his participation. Levorn has completed 23 exercise sessions.  Aaric vital signs are showing slight decrease to high normal reading for resting bp.  Manuelito has adopted heart healthy nutrition plan within the boundaries for someone who is 89. Weight loss 1.7 kg.  Continues to show progress with upper and lower body strength evident by his increased MET level to 2.2 on nustep and 1.9 on the arm ergometer.     Expected Outcomes  Jayshawn will continue to engage in Cardiac Rehab for exercise, nutrtion and adaptation of heart healthy lifestyle modfications. -- Avishai will continue to engage in Cardiac Rehab for exercise, nutrtion and adaptation of heart healthy lifestyle modfications.              Core Components/Risk Factors/Patient Goals at Discharge (Final Review):   Goals and Risk Factor Review - 03/06/22 1511       Core Components/Risk Factors/Patient Goals Review   Personal Goals Review Weight Management/Obesity;Lipids;Hypertension    Review Halo has completed 23 exercise sessions.  Caven vital signs are showing slight decrease to high normal reading for resting bp.  Coulson has adopted heart healthy nutrition plan within the boundaries for someone who is 89. Weight loss 1.7 kg.  Continues to show progress with upper and lower body strength evident by his increased MET level to 2.2 on nustep and 1.9 on the arm ergometer.    Expected Outcomes Presten will continue to engage in Cardiac Rehab for exercise, nutrtion and adaptation of heart healthy lifestyle modfications.             ITP Comments:  ITP Comments     Row Name 01/04/22 1636 01/09/22 2032 02/06/22 1228 03/06/22 1503     ITP Comments Dr Fransico Him MD, Medical Director 30 day ITP Review, Nathanal began Cardiac rehab on 01/08/22 30 day ITP Review 30 day ITP Review, Conrado is looking forward to graduating from Cardiac Rehb on 03/09/22             Comments: Pt is making expected progress toward personal goals after completing 23 sessions. Recommend continued exercise and life style modification education including stress management and relaxation techniques to decrease cardiac risk profile.  Cherre Huger, BSN Cardiac and Training and development officer

## 2022-03-07 ENCOUNTER — Encounter (HOSPITAL_COMMUNITY)
Admission: RE | Admit: 2022-03-07 | Discharge: 2022-03-07 | Disposition: A | Discharge status: Home / Self Care | Payer: Medicare Other | Source: Ambulatory Visit | Attending: Cardiology | Admitting: Cardiology

## 2022-03-07 DIAGNOSIS — Z955 Presence of coronary angioplasty implant and graft: Secondary | ICD-10-CM | POA: Diagnosis not present

## 2022-03-08 ENCOUNTER — Encounter: Payer: Self-pay | Admitting: *Deleted

## 2022-03-09 ENCOUNTER — Encounter (HOSPITAL_COMMUNITY)
Admission: RE | Admit: 2022-03-09 | Discharge: 2022-03-09 | Disposition: A | Discharge status: Home / Self Care | Payer: Medicare Other | Source: Ambulatory Visit | Attending: Cardiology | Admitting: Cardiology

## 2022-03-09 DIAGNOSIS — Z955 Presence of coronary angioplasty implant and graft: Secondary | ICD-10-CM

## 2022-03-09 NOTE — Progress Notes (Cosign Needed Addendum)
Discharge Progress Report  Patient Details  Name: Marcus REAGLE Sr. MRN: [id number] Date of Birth: 03-Oct-1931 Referring Provider:   Flowsheet Row CARDIAC REHAB PHASE II ORIENTATION from 01/04/2022 in [place]  Referring Provider Dr. Janina Mayo, MD        Number of Visits: 26  Reason for Discharge:  Patient reached a stable level of exercise. Patient independent in their exercise.  Smoking History:  Social History   Tobacco Use  Smoking Status Former   Packs/day: 1.00   Years: 30.00   Total pack years: 30.00   Types: Cigarettes   Quit date: 01/10/1982   Years since quitting: 40.2  Smokeless Tobacco Never  Tobacco Comments   Former smoker 10/17/2021    Diagnosis:  02/29/23 S/ P DES Prox LAD  ADL UCSD:   Initial Exercise Prescription:  Initial Exercise Prescription - 01/04/22 1400       Date of Initial Exercise RX and Referring Provider   Date 01/04/22    Referring Provider Dr. Janina Mayo, MD    Expected Discharge Date 03/02/22      NuStep   Level 1    SPM 60    Minutes 15    METs 1.8      Arm Ergometer   Level 1    Minutes 15    METs 1.8      Prescription Details   Frequency (times per week) 3    Duration Progress to 30 minutes of continuous aerobic without signs/symptoms of physical distress      Intensity   THRR 40-80% of Max Heartrate 53-106    Ratings of Perceived Exertion 11-13    Perceived Dyspnea 0-4      Progression   Progression Continue progressive overload as per policy without signs/symptoms or physical distress.      Resistance Training   Training Prescription Yes    Weight 2lbs    Reps 10-15             Discharge Exercise Prescription (Final Exercise Prescription Changes):  Exercise Prescription Changes - 03/09/22 0830       Response to Exercise   Blood Pressure (Admit) 140/76    Blood Pressure (Exercise) 138/76    Blood Pressure (Exit) 120/60    Heart Rate (Admit) 63 bpm     Heart Rate (Exercise) 69 bpm    Heart Rate (Exit) 53 bpm    Rating of Perceived Exertion (Exercise) 12.5    Symptoms None    Comments Pt graduated the CRP2 program today    Duration Continue with 30 min of aerobic exercise without signs/symptoms of physical distress.    Intensity THRR unchanged      Progression   Progression Continue to progress workloads to maintain intensity without signs/symptoms of physical distress.    Average METs 1.8      Resistance Training   Training Prescription Yes    Weight 3 lbs wts    Reps 10-15    Time 10 Minutes      Interval Training   Interval Training No      NuStep   Level 3    SPM 60    Minutes 15    METs 2.2      Arm Ergometer   Level 1    Watts 1    Minutes 15    METs 1.4      Home Exercise Plan   Plans to continue exercise at Home (comment)  Frequency Add 1 additional day to program exercise sessions.    Initial Home Exercises Provided 01/22/22             Functional Capacity:  6 Minute Walk     Row Name 01/04/22 1444 02/28/22 0830       6 Minute Walk   Phase Initial Discharge    Distance 1138 feet 1353 feet    Distance % Change -- 18.89 %    Distance Feet Change -- 215 ft    Walk Time 6 minutes 6 minutes    # of Rest Breaks 0 0    MPH 2.16 2.56    METS 1.44 1.91    RPE 13 12    Perceived Dyspnea  0 0    VO2 Peak 5.03 6.69    Symptoms Yes (comment) Yes (comment)    Comments Headache at the 3.30 mark, pt wanted to continue, resolved by end of walk test Bilateral calf pain 2/10 relieved with rest    Resting HR 58 bpm 71 bpm    Resting BP 144/64 126/60    Resting Oxygen Saturation  99 % 97 %    Exercise Oxygen Saturation  during 6 min walk 100 % 97 %    Max Ex. HR 66 bpm 70 bpm    Max Ex. BP 128/62 130/62    2 Minute Post BP 142/70 104/70             Psychological, QOL, Others - Outcomes: PHQ 2/9:    03/09/2022    1:35 PM 01/04/2022    4:39 PM 07/22/2019   12:31 PM 11/19/2018   10:58 AM   Depression screen PHQ 2/9  Decreased Interest 0 0 0 0  Down, Depressed, Hopeless 0 0 0 0  PHQ - 2 Score 0 0 0 0  Altered sleeping 0     Tired, decreased energy 1     Change in appetite 0     Feeling bad or failure about yourself  0     Trouble concentrating 0     Moving slowly or fidgety/restless 0     Suicidal thoughts 0     PHQ-9 Score 1     Difficult doing work/chores Not difficult at all       Quality of Life:  Quality of Life - 03/02/22 0830       Quality of Life   Select Quality of Life      Quality of Life Scores   Health/Function Post 19.64 %    Socioeconomic Post 22.9 %    Psych/Spiritual Post 20.86 %    Family Post 26.63 %    GLOBAL Post 21.4 %             Personal Goals: Goals established at orientation with interventions provided to work toward goal.  Personal Goals and Risk Factors at Admission - 01/04/22 1503       Core Components/Risk Factors/Patient Goals on Admission   Hypertension Yes    Intervention Provide education on lifestyle modifcations including regular physical activity/exercise, weight management, moderate sodium restriction and increased consumption of fresh fruit, vegetables, and low fat dairy, alcohol moderation, and smoking cessation.;Monitor prescription use compliance.    Expected Outcomes Short Term: Continued assessment and intervention until BP is < 140/23m HG in hypertensive participants. < 130/869mHG in hypertensive participants with diabetes, heart failure or chronic kidney disease.;Long Term: Maintenance of blood pressure at goal levels.    Lipids Yes    Intervention  Provide education and support for participant on nutrition & aerobic/resistive exercise along with prescribed medications to achieve LDL <53m, HDL >464m    Expected Outcomes Short Term: Participant states understanding of desired cholesterol values and is compliant with medications prescribed. Participant is following exercise prescription and nutrition  guidelines.;Long Term: Cholesterol controlled with medications as prescribed, with individualized exercise RX and with personalized nutrition plan. Value goals: LDL < 7065mHDL > 40 mg.    Personal Goal Other Yes    Personal Goal Long and short the same: gain upper body strength, and strength    Intervention Will continue to monitor pt and progress pt as tolerated without sign or symptom    Expected Outcomes Pt will achieve his goals              Personal Goals Discharge:  Goals and Risk Factor Review     Row Name 01/09/22 2029 02/06/22 1233 03/06/22 1511 03/09/22 1339       Core Components/Risk Factors/Patient Goals Review   Personal Goals Review Weight Management/Obesity;Lipids;Hypertension Weight Management/Obesity;Lipids;Hypertension Weight Management/Obesity;Lipids;Hypertension Weight Management/Obesity;Lipids;Hypertension    Review Tylin started Cardiac rehab on 4/10.  Anticipate progress toward program goals with the next 30 day review. RicDoniel off to a great start in cardiac rehab.  Vital signs show borderline high blood pressure pre exercise however post exercise bp are generally within acceptable range.  Ashvin adheres to heart healthy diet within the boundaries of someone his age. Augustino is showing improved upper body strength as evident by the increased MET level on the arm ergometer.  Weight loss negligible from the beginning of his participation. Jeriah has completed 23 exercise sessions.  Eluterio vital signs are showing slight decrease to high normal reading for resting bp.  Bram has adopted heart healthy nutrition plan within the boundaries for someone who is 89. Weight loss 1.7 kg.  Continues to show progress with upper and lower body strength evident by his increased MET level to 2.2 on nustep and 1.9 on the arm ergometer. Doil graduates with the completion of 26 exercise sessions.  Vinh made good progress physically particular with his desire to increase his  upper body strength. Weight loss of 1.9 kg with pt reporting his clothes fit him diffently and they are loose on him.  vital signs remain stable and consistent for him    Expected Outcomes RicTuronll continue to engage in Cardiac Rehab for exercise, nutrtion and adaptation of heart healthy lifestyle modfications. -- RicJerriell continue to engage in Cardiac Rehab for exercise, nutrtion and adaptation of heart healthy lifestyle modfications. [facility]  Encouraged pt to increase his duration as tolerated and pick up additional days when able as he has the flexibility in a community gym setting to do so.             Exercise Goals and Review:  Exercise Goals     Row Name 01/04/22 1453             Exercise Goals   Increase Physical Activity Yes       Intervention Provide advice, education, support and counseling about physical activity/exercise needs.;Develop an individualized exercise prescription for aerobic and resistive training based on initial evaluation findings, risk stratification, comorbidities and participant's personal goals.       Expected Outcomes Short Term: Attend rehab on a regular basis to increase amount of physical activity.;Long Term: Exercising regularly at least 3-5 days a week.;Long Term: Add  in home exercise to make exercise part of routine and to increase amount of physical activity.       Increase Strength and Stamina Yes       Intervention Provide advice, education, support and counseling about physical activity/exercise needs.;Develop an individualized exercise prescription for aerobic and resistive training based on initial evaluation findings, risk stratification, comorbidities and participant's personal goals.       Expected Outcomes Short Term: Increase workloads from initial exercise prescription for resistance, speed, and METs.;Short Term: Perform resistance training exercises routinely during rehab and add in  resistance training at home;Long Term: Improve cardiorespiratory fitness, muscular endurance and strength as measured by increased METs and functional capacity (6MWT)       Able to understand and use rate of perceived exertion (RPE) scale Yes       Intervention Provide education and explanation on how to use RPE scale       Expected Outcomes Short Term: Able to use RPE daily in rehab to express subjective intensity level;Long Term:  Able to use RPE to guide intensity level when exercising independently       Knowledge and understanding of Target Heart Rate Range (THRR) Yes       Intervention Provide education and explanation of THRR including how the numbers were predicted and where they are located for reference       Expected Outcomes Short Term: Able to state/look up THRR;Long Term: Able to use THRR to govern intensity when exercising independently;Short Term: Able to use daily as guideline for intensity in rehab       Understanding of Exercise Prescription Yes       Intervention Provide education, explanation, and written materials on patient's individual exercise prescription       Expected Outcomes Short Term: Able to explain program exercise prescription;Long Term: Able to explain home exercise prescription to exercise independently                Exercise Goals Re-Evaluation:  Exercise Goals Re-Evaluation     Row Name 01/08/22 0811 01/22/22 0830 03/09/22 0830         Exercise Goal Re-Evaluation   Exercise Goals Review Increase Physical Activity;Able to understand and use rate of perceived exertion (RPE) scale Increase Physical Activity;Able to understand and use rate of perceived exertion (RPE) scale;Knowledge and understanding of Target Heart Rate Range (THRR);Understanding of Exercise Prescription;Increase Strength and Stamina Increase Physical Activity;Able to understand and use rate of perceived exertion (RPE) scale;Knowledge and understanding of Target Heart Rate Range  (THRR);Understanding of Exercise Prescription;Increase Strength and Stamina     Comments Patient able to understand and use RPE scale appropriately. Reviewed MET's, goals and home ExRx. Pt is tolerating exercise well with an average MET level of 2.0. Pt is interested in going to [place] when he graduates, but until then he walks at [employer] with a cart for 20-30 minutes 1-2 days a week. Pt graduated the CRP2 program. Pt  tolerated exercise well with an average MET level of 1.8. Referral sent to [facility] for pt to continue fitness routine 5 days for 30 mins per session, or walking. Pt was very thankful to the [facility] team, even though MET's did not increase very much, pt felt stronger and did very well.     Expected Outcomes Progress workloads as tolerated to help increase cardiorespiratory fitness. Will continue to monitor pt and progress workloads as tolerated without sign or symptom. Pt will continue to exercise on his own and gain strength.  Nutrition & Weight - Outcomes:  Pre Biometrics - 01/04/22 1442       Pre Biometrics   Waist Circumference 38.25 inches    Hip Circumference 40.25 inches    Waist to Hip Ratio 0.95 %    Triceps Skinfold 7 mm    % Body Fat 23.8 %    Grip Strength 35 kg    Flexibility --   Pt unable to reach   Single Leg Stand 4.7 seconds             Post Biometrics - 03/02/22 0830        Post  Biometrics   Height 5' 6.5" (1.689 m)    Weight 156 lb 1.4 oz (70.8 kg)    Waist Circumference 36.5 inches    Hip Circumference 38.5 inches    Waist to Hip Ratio 0.95 %    BMI (Calculated) 24.82    Triceps Skinfold 11 mm    % Body Fat 24.4 %    Grip Strength 35 kg    Single Leg Stand 2 seconds             Nutrition:  Nutrition Therapy & Goals - 03/09/22 0759       Nutrition Therapy   Diet Heart Healthy Diet      Personal Nutrition Goals   Comments Patient declined RD assistance/intervention at orientation. Patient graduates today; he  continues to follow primarily a vegan diet with fish intake1-2x/month. Reports significant lentil/legume intake.      Intervention Plan   Intervention --   Patient declined RD assistance/intervention at orientation.   Expected Outcomes Long Term Goal: Adherence to prescribed nutrition plan.             Nutrition Discharge:  Nutrition Assessments - 03/02/22 0801       Rate Your Plate Scores   Post Score 70             Education Questionnaire Score:  Knowledge Questionnaire Score - 03/02/22 0830       Knowledge Questionnaire Score   Post Score 21/24             Goals reviewed with patient. Pt graduated from cardiac rehab program today with completion of 26 exercise sessions in Phase II. Pt maintained good attendance and progressed nicely during his participation in rehab as evidenced by increased MET level with all time high on nustep of 2.2.   Medication list reconciled. Repeat  PHQ score-1 related to energy level/deconditioning.  Pt has made significant lifestyle changes and should be commended for his success. Pt feels he has achieved his goals during cardiac rehab.   Pt plans to continue exercise at [facility].  Cherre Huger, BSN Cardiac and Training and development officer

## 2022-03-14 DIAGNOSIS — Z947 Corneal transplant status: Secondary | ICD-10-CM | POA: Diagnosis not present

## 2022-03-14 DIAGNOSIS — Z961 Presence of intraocular lens: Secondary | ICD-10-CM | POA: Diagnosis not present

## 2022-03-14 DIAGNOSIS — H02134 Senile ectropion of left upper eyelid: Secondary | ICD-10-CM | POA: Diagnosis not present

## 2022-03-14 DIAGNOSIS — H02131 Senile ectropion of right upper eyelid: Secondary | ICD-10-CM | POA: Diagnosis not present

## 2022-03-14 DIAGNOSIS — H26491 Other secondary cataract, right eye: Secondary | ICD-10-CM | POA: Diagnosis not present

## 2022-03-15 ENCOUNTER — Telehealth: Payer: Self-pay | Admitting: Internal Medicine

## 2022-03-15 NOTE — Telephone Encounter (Signed)
  Per MyChart scheduling message:  dear Dr Harl Bowie..I continue to feel my electrolytes may be about of balance beginning with the addition of spironolactone.Would you please authorize a fasting lab test to verify.I have added [facility] to my [facility] and will send you confirmation. Dr Julaine Hua  [phone].Best regards,Cavin Brewbaker 1932/08/10.

## 2022-03-15 NOTE — Telephone Encounter (Signed)
Spoke to pt about his call in. Asking for blood work after starting Spironolactone and b/c he feels "shaky".  Pt aware he asked this same thing beginning of last month and was informed labs came back normal. Reports he has had to double is water intake and he is "tired & lightheaded for past 2 months".  Reports HRs controlled in the 70s and having no afib that he is aware of. Denies CP, syncope. Pt advised to f/u w/ PCP about his concerns, states he has tried but they won't call him back. Advised to reach back out to them.   Will forward to Dr. Harl Bowie to see if she has other advisement.

## 2022-03-20 ENCOUNTER — Other Ambulatory Visit: Payer: Self-pay | Admitting: Adult Health

## 2022-03-20 NOTE — Telephone Encounter (Signed)
Prescription refill request for Xarelto received.  Indication:Afib Last office visit:2/23 Weight:70.8 kg Age:88 Scr:1.2 CrCl:43.43  ml/min  Prescription refilled

## 2022-04-18 ENCOUNTER — Ambulatory Visit (INDEPENDENT_AMBULATORY_CARE_PROVIDER_SITE_OTHER): Payer: Medicare Other | Admitting: Podiatry

## 2022-04-18 ENCOUNTER — Encounter: Payer: Self-pay | Admitting: Podiatry

## 2022-04-18 DIAGNOSIS — M79675 Pain in left toe(s): Secondary | ICD-10-CM

## 2022-04-18 DIAGNOSIS — E538 Deficiency of other specified B group vitamins: Secondary | ICD-10-CM

## 2022-04-18 DIAGNOSIS — M79674 Pain in right toe(s): Secondary | ICD-10-CM

## 2022-04-18 DIAGNOSIS — L84 Corns and callosities: Secondary | ICD-10-CM | POA: Diagnosis not present

## 2022-04-18 DIAGNOSIS — G629 Polyneuropathy, unspecified: Secondary | ICD-10-CM

## 2022-04-18 DIAGNOSIS — B351 Tinea unguium: Secondary | ICD-10-CM | POA: Diagnosis not present

## 2022-04-18 DIAGNOSIS — H9193 Unspecified hearing loss, bilateral: Secondary | ICD-10-CM | POA: Insufficient documentation

## 2022-04-18 NOTE — Progress Notes (Signed)
  Subjective:  Patient ID: Marcus Kaufmann Sr., male    DOB: Oct 30, 1931,  MRN: [id number]  Marcus Kaufmann Sr. presents to clinic today for at risk foot care with history of peripheral neuropathy, painful porokeratotic lesions right lower extremity. Pain prevent(s) comfortable ambulation. Aggravating factor is weightbearing with and without shoegear., and preulcerative lesion(s) left lower extremity and painful mycotic toenails that limit ambulation. Painful toenails interfere with ambulation. Aggravating factors include wearing enclosed shoe gear. Pain is relieved with periodic professional debridement. Painful porokeratotic lesions are aggravated when weightbearing with and without shoegear. Pain is relieved with periodic professional debridement.  New problem(s): None.   PCP is [name], MD , and last visit was  November 28, 2021  Allergies  Allergen Reactions   Ace Inhibitors Hives   Beta Adrenergic Blockers Hives   Statins Hives    Review of Systems: Negative except as noted in the HPI.  Objective: No changes noted in today's physical examination. Constitutional Marcus A Bullinger Sr. is a pleasant 88 y.o. Caucasian male, WD, WN in NAD. AAO x 3.   Vascular CFT immediate b/l LE. Palpable DP/PT pulses b/l LE. Digital hair sparse b/l. Skin temperature gradient WNL b/l. No pain with calf compression b/l. No edema noted b/l. No cyanosis or clubbing noted b/l LE.  Neurologic Normal speech. Oriented to person, place, and time. Pt has subjective symptoms of neuropathy. Protective sensation intact 5/5 intact bilaterally with 10g monofilament b/l. Vibratory sensation intact b/l.  Dermatologic Pedal integument with normal turgor, texture and tone BLE. No open wounds b/l LE. No interdigital macerations noted b/l LE. Toenails 1-5 b/l elongated, discolored, dystrophic, thickened, crumbly with subungual debris and tenderness to dorsal palpation. Hyperkeratotic lesion(s) submet head 5 b/l and medial  DIPJ of left 2nd digit. There is no surrounding erythema, no edema, no drainage, no odor, no fluctuance.Incurvated nailplate lleft 4th toe medial medial border(s) with tenderness to palpation. No erythema, no edema, no drainage noted.   Orthopedic: Normal muscle strength 5/5 to all lower extremity muscle groups bilaterally. HAV with bunion deformity noted b/l LE. Hammertoes 2nd digit b/l. No pain, crepitus or joint limitation noted with ROM b/l LE.  Patient ambulates independently without assistive aids. Wearing gray [employer] loafers.   Radiographs: None  Assessment/Plan: 1. Pain due to onychomycosis of toenails of both feet   2. Corns and callosities   3. B12 deficiency   4. Neuropathy     -Examined patient. -Patient to continue soft, supportive shoe gear daily. -Mycotic toenails 1-5 bilaterally were debrided in length and girth with sterile nail nippers and dremel without incident. -Callus(es) L 2nd toe and submet head 5 b/l pared utilizing mandrel sander without complication or incident. Total number debrided =3. -Patient/POA to call should there be question/concern in the interim.   Return in about 3 months (around 07/19/2022).  Marzetta Board, DPM

## 2022-04-20 ENCOUNTER — Telehealth: Payer: Self-pay

## 2022-04-20 NOTE — Telephone Encounter (Signed)
Called patient Dr.[name] received letter you brought to office.He advised to schedule appointment with him.Appointment scheduled with Dr.[name] 8/8 at 9:30 am.

## 2022-05-06 NOTE — Progress Notes (Signed)
Cardiology Office Note    Date:  05/08/2022   ID:  Marcus [name] Sr., DOB [birth year], MRN [id number]  PCP:  [name], MD  Cardiologist:   Bryona Foxworthy [place], MD   Chief Complaint  Patient presents with   Coronary Artery Disease   Atrial Flutter      History of Present Illness: Marcus ZEIMET Sr. is a 88 y.o. male who is seen for follow up atrial flutter and CHF. He has a history of myasthenia gravis and remote PAF in the setting of pneumonia July 2016.  He presented 08/20/2018 with a non-ST elevation MI.  08/21/2018 he underwent diagnostic catheterization and intervention with a DES to his RCA.   Patient did have a residual total LAD that Dr [name] attempted to cross but it appeared the wire got subintimal and so the procedure was aborted. He also has a 60% circumflex.  Echocardiogram showed an EF of 40% with moderate to severe MR.  Though his LVEDP at cath was normal he was felt to be volume overloaded. He was diuresed .  On initial follow up he complained of persistent SOB. Was seen by Dr. Melvyn Novas who felt this was not a pulmonary issue. Later switched from Brilinta to Plavix with significant improvement in symptoms. He was considered for potential CTO PCI of the LAD but since he was asymptomatic at that time this was not recommended.   He was seen in the ED on 03/01/20 after experiencing an episode of dyspnea and left arm pain while walking to his mailbox.  In the ED he was found to be in atrial flutter with 4:1 conduction and controlled rate. He was severely hypertensive. Potassium was elevated at 5.8. troponin was 15>18. CXR NAD. Admission was considered but patient refused. Losartan was stopped and he was started on amlodipine. Also placed on Xarelto. Repeat Echo done and showed improvement in LV function with normal EF. Only mild MR. Severe biatrial enlargement. After anticoagulation for 3-4 weeks he did undergo successful DCCV on 03/24/20. He was started on lasix 20 mg daily. Noted  significant improvement in his edema.  He was seen by Jory Sims NP in April 2022 with significant weight gain, swelling and BNP of >5000. He was found to be back in Atrial flutter and underwent DCCV on 02/14/21. Echo at that time showed normal EF. Mild MR, mild to mod AI, mild AS. Moderate biatrial enlargement. He was continued on lasix 40 mg daily and on follow up noted significant improvement in his symptoms.   In September he complained of positional dizziness. Heart monitor was placed showing NSR without Afib or flutter. Amlodipine dose was reduced.   In Dec and Jan he complained of exertional fatigue and continue dizziness and SOB. Nuclear stress test 10/05/21 was abnormal for ischemia showing medium defect with severe reduction in uptake present in the apical to mid anteroseptal location(s) that is reversible, viability present, abnormal wall motion, EF 54%. Echo 10/11/21 showed EF 55%, regional wall motion abnormalities with severe apical and apical septal hypokinesis, grade 2 DD, normal RV function, mildly enlarged RV, severely elevated PASP, moderate LAE, mild-moderate MR, moderate-severe TR, mild AS, mild dilation of ascending aorta.  Ecg was interpreted as NSR but in fact was atrial flutter with controlled response. He underwent cardiac cath showing large dominant RCA with severe proximal stenosis treated with DES, with moderate disease in prox and mid Cx, and functional chronic total occlusion of the calcified mid LAD, unchanged from last cath in  2019. Echo suggested severe pulmonary HTN but EDP at cath was normal at 9 mm Hg. He was seen in the Afib clinic and subsequently had DCCV on 11/03/21. Initial follow up on 2/10 showed NSR but when seen in the AFib clinic on 2/13 he was back in Atrial flutter. The plan was to initiate AAD therapy but the patient was concerned about the potential side effects and declined.   On follow up today he reports he completed cardiac Rehab. Notes his HR never got  above 70. He continues to complain of SOB with exertion- just walking from kitchen to bedroom. Fatigues easily. Feels lightheaded. Weight has been stable. No increase in edema. Denies any chest pain but did not have chest pain in January when he had his stent. He has a lot of questions about his valves and pulmonary HTN  noted on Echo. Had actually seen by a CT surgeon in [place] who told him to focus on pulmonary HTN.    Past Medical History:  Diagnosis Date   A-fib [name])    Acute on chronic combined systolic and diastolic CHF (congestive heart failure) ([place]) 08/22/2018   Acute on chronic respiratory failure ([name]) 04/15/2015   Anemia    Atrial flutter ([facility])    Bradycardia    CAD in native artery 08/22/2018   CAP (community acquired pneumonia) 04/13/2015   See cxr 04/13/2015 > admit [facility]     Complication of anesthesia    H/O hiatal hernia    Hyperlipidemia LDL goal <70 08/22/2018   Hypertension 08/22/2018   Hypothyroidism 04/13/2015   Myasthenia gravis ([name]) 04/13/2015   Obesity 04/13/2015   Paroxysmal A-fib ([name]) 04/16/2015   RBBB    S/P angioplasty with stent 08/21/18 DES to RCA 08/22/2018   Sepsis ([name]) 04/15/2015   Thrombocytopenia ([name])     Past Surgical History:  Procedure Laterality Date   AMPUTATION  06/11/2012   Procedure: AMPUTATION DIGIT;  Surgeon: [name], MD;  Location: [place];  Service: Orthopedics;  Laterality: Left;  revision of amputation   CARDIOVERSION N/A 03/24/2020   Procedure: CARDIOVERSION;  Surgeon: [name], [name], MD;  Location: [facility] ENDOSCOPY;  Service: Cardiovascular;  Laterality: N/A;   CARDIOVERSION N/A 02/14/2021   Procedure: CARDIOVERSION;  Surgeon: [name], MD;  Location: [place];  Service: Cardiovascular;  Laterality: N/A;   CARDIOVERSION N/A 11/03/2021   Procedure: CARDIOVERSION;  Surgeon: Buford Dresser, MD;  Location: [facility] ENDOSCOPY;  Service: Cardiovascular;  Laterality: N/A;   CORONARY STENT INTERVENTION N/A  08/21/2018   Procedure: CORONARY STENT INTERVENTION;  Surgeon: [name], MD;  Location: [place] CV LAB;  Service: Cardiovascular;  Laterality: N/A;   CORONARY STENT INTERVENTION N/A 10/13/2021   Procedure: CORONARY STENT INTERVENTION;  Surgeon: Burnell Blanks, MD;  Location: [place] CV LAB;  Service: Cardiovascular;  Laterality: N/A;   HERNIA REPAIR  1994   LEFT HEART CATH AND CORONARY ANGIOGRAPHY N/A 08/21/2018   Procedure: LEFT HEART CATH AND CORONARY ANGIOGRAPHY;  Surgeon: [name], MD;  Location: [place] CV LAB;  Service: Cardiovascular;  Laterality: N/A;   LEFT HEART CATH AND CORONARY ANGIOGRAPHY N/A 10/13/2021   Procedure: LEFT HEART CATH AND CORONARY ANGIOGRAPHY;  Surgeon: Burnell Blanks, MD;  Location: [place] CV LAB;  Service: Cardiovascular;  Laterality: N/A;   ORIF SHOULDER FRACTURE  06/13/2012   Procedure: OPEN REDUCTION INTERNAL FIXATION (ORIF) SHOULDER FRACTURE;  Surgeon: [name], MD;  Location: [place];  Service: Orthopedics;  Laterality: Left;  LEFT SHOULDER OPEN GREATER TUBEROSITY ORIF   SHOULDER CLOSED REDUCTION  06/11/2012   Procedure: CLOSED MANIPULATION SHOULDER;  Surgeon: [name], MD;  Location: [place];  Service: Orthopedics;  Laterality: Left;     Current Outpatient Medications  Medication Sig Dispense Refill   Carboxymethylcellulose Sod PF (THERATEARS PF) 0.25 % SOLN Apply to eye.     clopidogrel (PLAVIX) 75 MG tablet Take 1 tablet by mouth daily.     furosemide (LASIX) 20 MG tablet Take 1 tablet (20 mg total) by mouth daily. 90 tablet 3   levothyroxine (SYNTHROID) 137 MCG tablet Take 137 mcg by mouth daily before breakfast.     Magnesium 400 MG TABS Take 400 mg by mouth every evening.     nitroGLYCERIN (NITROSTAT) 0.4 MG SL tablet Place 1 tablet (0.4 mg total) under the tongue every 5 (five) minutes as needed for chest pain. 30 tablet 6   XARELTO 15 MG TABS tablet TAKE 1 TABLET(15 MG) BY MOUTH DAILY WITH  SUPPER 90 tablet 1   amLODipine (NORVASC) 10 MG tablet Take 1 tablet (10 mg total) by mouth daily. 90 tablet 3   No current facility-administered medications for this visit.    Allergies:   Ace inhibitors, Beta adrenergic blockers, and Statins    Social History:  The patient  reports that he quit smoking about 40 years ago. His smoking use included cigarettes. He has a 30.00 pack-year smoking history. He has never used smokeless tobacco. He reports that he does not currently use alcohol after a past usage of about 2.0 standard drinks of alcohol per week. He reports that he does not use drugs.   Family History:  The patient's family history includes Healthy in his daughter, sister, and son; Heart attack in his father; Other in his mother.    ROS:  Please see the history of present illness.   Otherwise, review of systems are positive for none.   All other systems are reviewed and negative.    PHYSICAL EXAM: VS:  BP 126/82   Pulse 72   Ht 5' 8"  (1.727 m)   Wt 160 lb 12.8 oz (72.9 kg)   SpO2 100%   BMI 24.45 kg/m  , BMI Body mass index is 24.45 kg/m. GEN: Well nourished, well developed, in no acute distress  HEENT: normal  Neck: no JVD, carotid bruits, or masses Cardiac: RRR; gr 1/6 systolic murmur RUSB, rubs, or gallops,no edema  Respiratory:  clear to auscultation bilaterally, normal work of breathing GI: soft, nontender, nondistended, + BS MS: no deformity or atrophy. Pedal pulses are excellent. Skin: lot of bruising on arms.  Neuro:  Strength and sensation are intact Psych: euthymic mood, full affect   EKG:  EKG is not ordered today.      Lab Results  Component Value Date   WBC 6.0 11/03/2021   HGB 12.3 (L) 11/03/2021   HCT 37.5 (L) 11/03/2021   PLT 122 (L) 11/03/2021   GLUCOSE 84 11/21/2021   CHOL 128 06/05/2019   TRIG 109 06/05/2019   HDL 41 06/05/2019   LDLCALC 67 06/05/2019   ALT 23 11/13/2021   AST 22 11/13/2021   NA 140 11/21/2021   K 4.7 11/21/2021    CL 105 11/21/2021   CREATININE 1.20 11/21/2021   BUN 29 (H) 11/21/2021   CO2 22 11/21/2021   TSH 3.742 10/14/2021   Dated 01/12/20: cholesterol 135, triglycerides 93, HDL 36, LDL 80.TSH normal.  Dated 11/17/20: cholesterol 152, triglycerides 84, HDL  44, LDL 91. UA, chemistries normal  Dated 01/16/21: cholesterol 127, triglycerides 74, HDL 39, LDL 73.    Wt Readings from Last 3 Encounters:  05/08/22 160 lb 12.8 oz (72.9 kg)  03/02/22 156 lb 1.4 oz (70.8 kg)  01/04/22 161 lb 9.6 oz (73.3 kg)      Other studies Reviewed: Additional studies/ records that were reviewed today include:   Echo 10/22/16: Study Conclusions   - Left ventricle: The cavity size was normal. There was moderate   concentric hypertrophy. Systolic function was normal. The   estimated ejection fraction was in the range of 55% to 60%. Wall   motion was normal; there were no regional wall motion   abnormalities. Features are consistent with a pseudonormal left   ventricular filling pattern, with concomitant abnormal relaxation   and increased filling pressure (grade 2 diastolic dysfunction). - Aortic valve: There was mild regurgitation. Valve area (VTI):   2.17 cm^2. - Mitral valve: Calcified annulus. There was mild regurgitation. - Left atrium: The atrium was moderately dilated. - Pulmonary arteries: PA peak pressure: 32 mm Hg (S).    Echo 08/21/18: Study Conclusions   - Left ventricle: The cavity size was normal. Wall thickness was   increased in a pattern of mild LVH. Mid to apical inferior   hypokinesis. Hypokinesis of the apex. The estimated ejection   fraction was 45%. Features are consistent with a pseudonormal   left ventricular filling pattern, with concomitant abnormal   relaxation and increased filling pressure (grade 2 diastolic   dysfunction). - Aortic valve: Trileaflet; moderately calcified leaflets. There   was mild stenosis. There was moderate eccentric regurgitation.   Mean gradient (S): 10 mm  Hg. Valve area (VTI): 1.73 cm^2. - Mitral valve: Moderately calcified annulus. Mildly calcified   leaflets. There is some degree of restriction of the posterior   leaflet. There was moderate to severe regurgitation. - Left atrium: The atrium was moderately dilated. - Right ventricle: The cavity size was normal. Systolic function   was normal. - Right atrium: The atrium was moderately to severely dilated. - Tricuspid valve: There was moderate regurgitation. Peak RV-RA   gradient 39 mmHg. - Pulmonary arteries: PA peak pressure: 47 mm Hg (S). - Systemic veins: IVC measured 2.5 cm with > 50% respirophasic   variation, suggesting RA pressure 8 mmHg.   Impressions:   - Normal LV size with mild LV hypertrophy. EF 45% with mid to   apical inferior hypokinesis and hypokinesis of the apex. Moderate   diastolic dysfunction. Normal RV size and systolic function.   Biatrial enlargement. Moderate to severe mitral regurgitation   with restriction of the posterior leaflet. Mild aortic stenosis   with moderate eccentric regurgitation. Mild pulmonary   hypertension.    Cardiac cath/PCI: 08/21/18: CORONARY STENT INTERVENTION  LEFT HEART CATH AND CORONARY ANGIOGRAPHY  Conclusion     Prox RCA lesion is 95% stenosed. Ost Cx lesion is 60% stenosed. Prox Cx lesion is 60% stenosed. Prox LAD lesion is 100% stenosed. A drug-eluting stent was successfully placed. Post intervention, there is a 0% residual stenosis. There is moderate left ventricular systolic dysfunction. LV end diastolic pressure is mildly elevated. The left ventricular ejection fraction is 35-45% by visual estimate.   Marcus WAILES Sr. is a 88 y.o. male      [id number] LOCATION:  FACILITY: [facility]  PHYSICIAN: Quay Burow, M.D. Nov 26, 1931     DATE OF PROCEDURE:  08/21/2018   DATE OF DISCHARGE:  CARDIAC CATHETERIZATION / PCI DES RCA       History obtained from chart review.Mr. Leach is an 69M with paroxysmal  atrial fibrillation, myasthenia gravis, hyperlipidemia and thrombocytopenia here with NSTEMI.      PROCEDURE DESCRIPTION:    The patient was brought to the second floor  Cardiac cath lab in the postabsorptive state. He was premedicated with IV Versed and fentanyl. His right wristwas prepped and shaved in usual sterile fashion. Xylocaine 1% was used for local anesthesia. A 6 French sheath was inserted into the right radial artery using standard Seldinger technique. The patient received 4000units  of heparin intravenously.  A 5 [place] TIG catheter and pigtail catheters were used for selective coronary angiography and left ventriculography respectively.  Isovue dye was used for the entirety of the case.  Retrograde aorta, left ventricular and pullback pressures were recorded.  Radial cocktail was administered via the SideArm sheath.   Patient received Brilinta 180 mg p.o. followed by Angiomax bolus and infusion with a therapeutic ACT demonstrated.  Using a 6 [place] JR4 guide catheter, 0.14 pro-water guidewire and a 2 mm x 12 mm balloon predilatation was performed to the proximal dominant RCA.  I then placed a 3 mm x 60 mm long Synergy drug-eluting stent across the diseased segment and deployed at 14 to 16 atm.  I then postdilated with a 3.25 x 12 mm long noncompliant balloon up to 16 atm (3.3 mm) resulting reduction with 95% ulcerated proximal dominant RCA plaque to 0% residual.  There was distal spasm resulting in ST segment elevation which resolved with administration of intracoronary nitroglycerin.   I then attempted to cross the mid LAD CTO using a 6 [place] XB LAD 4 cm curve guide catheter, pro-water and whisper wire and 1.5 mm balloon.  I did ultimately cross the lesion but I did not think I was intraluminal and ultimately aborted the procedure.     IMPRESSION: Successful proximal RCA PCI and drug-eluting stenting using a synergy drug-eluting stent postdilated to 3.3 mm with unsuccessful  attempt at crossing mid LAD CTO.  His LV function is moderately reduced in the 40% range.  His LAD CTO can be treated in a staged fashion electively.  The sheath was removed and a TR band was placed on the right wrist to achieve patent hemostasis.  The patient left the lab in stable condition.   Quay Burow. MD, [facility] 08/21/2018 5:20 PM         Recommend uninterrupted dual antiplatelet therapy with Aspirin 60m daily and Ticagrelor 953mtwice daily for a minimum of 12 months (ACS - Class I recommendation).   Echo 03/07/20: IMPRESSIONS     1. Left ventricular ejection fraction, by estimation, is 60 to 65%. The  left ventricle has normal function. The left ventricle has no regional  wall motion abnormalities. Left ventricular diastolic function could not  be evaluated.   2. Right ventricular systolic function is normal. The right ventricular  size is normal. There is mildly elevated pulmonary artery systolic  pressure.   3. Left atrial size was severely dilated.   4. Right atrial size was severely dilated.   5. The mitral valve is normal in structure. Mild mitral valve  regurgitation. No evidence of mitral stenosis.   6. Tricuspid valve regurgitation is moderate.   7. The aortic valve is grossly normal. Aortic valve regurgitation is  mild. No aortic stenosis is present.   8. Aortic dilatation noted. There is mild dilatation of the ascending  aorta measuring 39 mm.   Echocardiogram 02/03/2021  1. Left ventricular ejection fraction, by estimation, is 60 to 65%. The  left ventricle has normal function. The left ventricle has no regional  wall motion abnormalities. There is mild left ventricular hypertrophy.  Left ventricular diastolic function  could not be evaluated.   2. Right ventricular systolic function is normal. The right ventricular  size is normal. There is normal pulmonary artery systolic pressure. The  estimated right ventricular systolic pressure is 53.9 mmHg.   3. Left  atrial size was moderately dilated.   4. Right atrial size was moderately dilated.   5. The mitral valve is degenerative. Mild mitral valve regurgitation.  Moderate mitral annular calcification.   6. The tricuspid valve is abnormal. Tricuspid valve regurgitation is  moderate.   7. The aortic valve is tricuspid. Aortic valve regurgitation is mild to  moderate. Mild aortic valve stenosis. Aortic valve area, by VTI measures  1.70 cm. Aortic valve mean gradient measures 15.2 mmHg. Aortic valve Vmax  measures 2.62 m/s.   8. Aortic dilatation noted. There is mild dilatation of the ascending  aorta, measuring 40 mm.   9. The inferior vena cava is dilated in size with >50% respiratory  variability, suggesting right atrial pressure of 8 mmHg.  Comparison(s): Changes from prior study are noted. 03/07/2020: LVEF 60-65%,  severe biatrial enlargement, moderate TR, mild AI.   SPECT 10/05/2021  Findings are consistent with ischemia. The study is intermediate risk.   No ST deviation was noted.   LV perfusion is abnormal. There is evidence of ischemia. There is no evidence of infarction. Defect 1: There is a medium defect with severe reduction in uptake present in the apical to mid anteroseptal location(s) that is reversible. Viability is present. There is abnormal wall motion in the defect area. Consistent with ischemia.   Left ventricular function is abnormal. Global function is mildly reduced. There was a single regional abnormality. Nuclear stress EF: 54 %. The left ventricular ejection fraction is mildly decreased (45-54%). End diastolic cavity size is normal.   Prior study not available for comparison.   Echo 10/11/21: IMPRESSIONS     1. Left ventricular ejection fraction, by estimation, is 55%. The left  ventricle has normal function. The left ventricle demonstrates regional  wall motion abnormalities with severe apical and apical septal  hypokinesis. There is mild left ventricular  hypertrophy.  Left ventricular diastolic parameters are consistent with  Grade II diastolic dysfunction (pseudonormalization). The average left  ventricular global longitudinal strain is -16.8 %. The global longitudinal  strain is abnormal.   2. Right ventricular systolic function is normal. The right ventricular  size is mildly enlarged. There is severely elevated pulmonary artery  systolic pressure. The estimated right ventricular systolic pressure is  76.7 mmHg.   3. Left atrial size was moderately dilated.   4. Right atrial size was severely dilated.   5. The mitral valve is abnormal. Mild to moderate mitral valve  regurgitation. No evidence of mitral stenosis. Moderate mitral annular  calcification.   6. Tricuspid valve regurgitation is moderate to severe.   7. The aortic valve is tricuspid. Aortic valve regurgitation is moderate.  Mild aortic valve stenosis. Aortic valve mean gradient measures 14.0 mmHg.   8. Aortic dilatation noted. There is mild dilatation of the ascending  aorta, measuring 40 mm.   9. The inferior vena cava is dilated in size with <50% respiratory  variability, suggesting right atrial pressure of 15 mmHg.  Comparison(s): 02/13/21 EF 60-65%. PA pressure 90mHg. Mild AS 184mg mean  PG, 2898m peak PG.   Cardiac cath 10/13/21:  CORONARY STENT INTERVENTION  LEFT HEART CATH AND CORONARY ANGIOGRAPHY   Conclusion      Prox RCA-1 lesion is 99% stenosed.   Ost Cx to Mid Cx lesion is 60% stenosed.   Mid LAD lesion is 100% stenosed.   Previously placed Prox RCA-2 stent (unknown type) is  widely patent.   A drug-eluting stent was successfully placed using a SYNERGY XD [id number].   Post intervention, there is a 0% residual stenosis.   Large dominant RCA with severe proximal stenosis. The proximal stent is patent.  Successful PTCA/DES x 1 proximal RCA Moderate disease in the proximal and mid Circumflex Functional chronic total occlusion of the calcified mid LAD, unchanged from  last cath in 2019. The distal LAD fills faintly from antegrade flow and from right to left collaterals.    Recommendations: Will continue ASA and Plavix tomorrow. If his Eliquis is restarted tomorrow, I think the ASA can be stopped and he can be continued on Plavix along with Eliquis. Monitor overnight given elevated BP before the procedure and bleeding risk.   Coronary Diagrams  Diagnostic Dominance: Right  Intervention     ASSESSMENT AND PLAN:  1.  CAD with s/p NSTEMI in November 2019 secondary to ruptured plaque lesion in the RCA. S/p DES. He has CTO of the mid LAD but had been asymptomatic from this. S/p stenting of the proximal RCA in Jan 2023 proximal to prior stent. Modest 60% disease in the LCx. CTO of the LAD unchanged.  Intolerant of Brilinta in the past. On Plavix and anticoagulation with Xarelto. He does have persistent DOE. Given concern with pulmonary HTN I think repeat cardiac cath including right heart cath is appropriate. We had previously discussed CTO PCI of the LAD in the past but this never really caused symptoms and I think it alone is not responsible for his current symptoms. The procedure and risks were reviewed including but not limited to death, myocardial infarction, stroke, arrythmias, bleeding, transfusion, emergency surgery, dye allergy, or renal dysfunction. The patient voices understanding and is agreeable to proceed.   2. Hyperlipidemia. Will update labs. History of intolerance to statins. Will consider alternatives.   3. Chronic diastolic CHF.exacerbated by Atrial flutter. On lasix. Will assess hemodynamics at cath.   4. HTN. On amlodipine 10 mg daily. Well controlled.   5. Myastenia gravis in remission  6. Hypothyroidism. Check TSH  7. Atrial flutter - recurrent. Patient has had several cardioversions now with most recent in February with early recurrence. Rate is well controlled. Looking at his history it is clear that he is quite symptomatic in Atrial  flutter and I suspect his current symptoms are largely related to this.  On no AV nodal blocking agents.  Now on  Xarelto 15 mg daily. No ASA or NSAIDs. We again discussed options for treating atrial flutter including AAD therapy or ablation. At his age I don't think he is a great candidate for ablation unless failed AAD therapy. Discussed options for AAD therapy including amiodarone versus Tikosyn. He is reluctant but will readdress after cardiac cath.   8. Pulmonary HTN. I suspect this is largely related to chronic CHF. No prior history of significant lung disease. Pulmonary embolic disease is unlikely on chronic anticoagulation. Will directly measure pulmonary pressures at cath  9. Valvular heart disease. Mild to moderate MR, moderate AI, mild AS and mod to  severe TR noted on prior Echo. Will assess hemodynamics at cath.    Current medicines are reviewed at length with the patient today.  The patient does not have concerns regarding medicines.  The following changes have been made:  no change  Follow up after cardiac cath.  Signed, Violette Morneault [place], MD  05/08/2022 10:00 AM    [facility] [address] [place], [place], [address] Phone [phone], [phone] [phone]

## 2022-05-06 NOTE — H&P (View-Only) (Signed)
Cardiology Office Note    Date:  05/08/2022   ID:  [name], DOB 1932-09-19, MRN [id number]  PCP:  [name], MD  Cardiologist:   Oriya Kettering [place], MD   Chief Complaint  Patient presents with   Coronary Artery Disease   Atrial Flutter      History of Present Illness: Marcus MEARS Sr. is a 88 y.o. male who is seen for follow up atrial flutter and CHF. He has a history of myasthenia gravis and remote PAF in the setting of pneumonia July 2016.  He presented 08/20/2018 with a non-ST elevation MI.  08/21/2018 he underwent diagnostic catheterization and intervention with a DES to his RCA.   Patient did have a residual total LAD that Dr [name] attempted to cross but it appeared the wire got subintimal and so the procedure was aborted. He also has a 60% circumflex.  Echocardiogram showed an EF of 40% with moderate to severe MR.  Though his LVEDP at cath was normal he was felt to be volume overloaded. He was diuresed .  On initial follow up he complained of persistent SOB. Was seen by Dr. Melvyn Novas who felt this was not a pulmonary issue. Later switched from Brilinta to Plavix with significant improvement in symptoms. He was considered for potential CTO PCI of the LAD but since he was asymptomatic at that time this was not recommended.   He was seen in the ED on 03/01/20 after experiencing an episode of dyspnea and left arm pain while walking to his mailbox.  In the ED he was found to be in atrial flutter with 4:1 conduction and controlled rate. He was severely hypertensive. Potassium was elevated at 5.8. troponin was 15>18. CXR NAD. Admission was considered but patient refused. Losartan was stopped and he was started on amlodipine. Also placed on Xarelto. Repeat Echo done and showed improvement in LV function with normal EF. Only mild MR. Severe biatrial enlargement. After anticoagulation for 3-4 weeks he did undergo successful DCCV on 03/24/20. He was started on lasix 20 mg daily. Noted  significant improvement in his edema.  He was seen by Jory Sims NP in April 2022 with significant weight gain, swelling and BNP of >5000. He was found to be back in Atrial flutter and underwent DCCV on 02/14/21. Echo at that time showed normal EF. Mild MR, mild to mod AI, mild AS. Moderate biatrial enlargement. He was continued on lasix 40 mg daily and on follow up noted significant improvement in his symptoms.   In September he complained of positional dizziness. Heart monitor was placed showing NSR without Afib or flutter. Amlodipine dose was reduced.   In Dec and Jan he complained of exertional fatigue and continue dizziness and SOB. Nuclear stress test 10/05/21 was abnormal for ischemia showing medium defect with severe reduction in uptake present in the apical to mid anteroseptal location(s) that is reversible, viability present, abnormal wall motion, EF 54%. Echo 10/11/21 showed EF 55%, regional wall motion abnormalities with severe apical and apical septal hypokinesis, grade 2 DD, normal RV function, mildly enlarged RV, severely elevated PASP, moderate LAE, mild-moderate MR, moderate-severe TR, mild AS, mild dilation of ascending aorta.  Ecg was interpreted as NSR but in fact was atrial flutter with controlled response. He underwent cardiac cath showing large dominant RCA with severe proximal stenosis treated with DES, with moderate disease in prox and mid Cx, and functional chronic total occlusion of the calcified mid LAD, unchanged from last cath in  2019. Echo suggested severe pulmonary HTN but EDP at cath was normal at 9 mm Hg. He was seen in the Afib clinic and subsequently had DCCV on 11/03/21. Initial follow up on 2/10 showed NSR but when seen in the AFib clinic on 2/13 he was back in Atrial flutter. The plan was to initiate AAD therapy but the patient was concerned about the potential side effects and declined.   On follow up today he reports he completed cardiac Rehab. Notes his HR never got  above 70. He continues to complain of SOB with exertion- just walking from kitchen to bedroom. Fatigues easily. Feels lightheaded. Weight has been stable. No increase in edema. Denies any chest pain but did not have chest pain in January when he had his stent. He has a lot of questions about his valves and pulmonary HTN  noted on Echo. Had actually seen by a CT surgeon in [place] who told him to focus on pulmonary HTN.    Past Medical History:  Diagnosis Date   A-fib [name]    Acute on chronic combined systolic and diastolic CHF (congestive heart failure) ([place]) 08/22/2018   Acute on chronic respiratory failure ([place]) 04/15/2015   Anemia    Atrial flutter ([facility])    Bradycardia    CAD in native artery 08/22/2018   CAP (community acquired pneumonia) 04/13/2015   See cxr 04/13/2015 > admit [facility]     Complication of anesthesia    H/O hiatal hernia    Hyperlipidemia LDL goal <70 08/22/2018   Hypertension 08/22/2018   Hypothyroidism 04/13/2015   Myasthenia gravis ([place]) 04/13/2015   Obesity 04/13/2015   Paroxysmal A-fib ([place]) 04/16/2015   RBBB    S/P angioplasty with stent 08/21/18 DES to RCA 08/22/2018   Sepsis ([place]) 04/15/2015   Thrombocytopenia ([place])     Past Surgical History:  Procedure Laterality Date   AMPUTATION  06/11/2012   Procedure: AMPUTATION DIGIT;  Surgeon: [name], MD;  Location: [place];  Service: Orthopedics;  Laterality: Left;  revision of amputation   CARDIOVERSION N/A 03/24/2020   Procedure: CARDIOVERSION;  Surgeon: [name], [name], MD;  Location: [facility] ENDOSCOPY;  Service: Cardiovascular;  Laterality: N/A;   CARDIOVERSION N/A 02/14/2021   Procedure: CARDIOVERSION;  Surgeon: [name], MD;  Location: [place];  Service: Cardiovascular;  Laterality: N/A;   CARDIOVERSION N/A 11/03/2021   Procedure: CARDIOVERSION;  Surgeon: Buford Dresser, MD;  Location: [facility] ENDOSCOPY;  Service: Cardiovascular;  Laterality: N/A;   CORONARY STENT INTERVENTION N/A  08/21/2018   Procedure: CORONARY STENT INTERVENTION;  Surgeon: [name], MD;  Location: [place] CV LAB;  Service: Cardiovascular;  Laterality: N/A;   CORONARY STENT INTERVENTION N/A 10/13/2021   Procedure: CORONARY STENT INTERVENTION;  Surgeon: Burnell Blanks, MD;  Location: [place] CV LAB;  Service: Cardiovascular;  Laterality: N/A;   HERNIA REPAIR  1994   LEFT HEART CATH AND CORONARY ANGIOGRAPHY N/A 08/21/2018   Procedure: LEFT HEART CATH AND CORONARY ANGIOGRAPHY;  Surgeon: [name], MD;  Location: [place] CV LAB;  Service: Cardiovascular;  Laterality: N/A;   LEFT HEART CATH AND CORONARY ANGIOGRAPHY N/A 10/13/2021   Procedure: LEFT HEART CATH AND CORONARY ANGIOGRAPHY;  Surgeon: Burnell Blanks, MD;  Location: [place] CV LAB;  Service: Cardiovascular;  Laterality: N/A;   ORIF SHOULDER FRACTURE  06/13/2012   Procedure: OPEN REDUCTION INTERNAL FIXATION (ORIF) SHOULDER FRACTURE;  Surgeon: [name], MD;  Location: [place];  Service: Orthopedics;  Laterality: Left;  LEFT SHOULDER OPEN GREATER TUBEROSITY ORIF   SHOULDER CLOSED REDUCTION  06/11/2012   Procedure: CLOSED MANIPULATION SHOULDER;  Surgeon: [name], MD;  Location: [place];  Service: Orthopedics;  Laterality: Left;     Current Outpatient Medications  Medication Sig Dispense Refill   Carboxymethylcellulose Sod PF (THERATEARS PF) 0.25 % SOLN Apply to eye.     clopidogrel (PLAVIX) 75 MG tablet Take 1 tablet by mouth daily.     furosemide (LASIX) 20 MG tablet Take 1 tablet (20 mg total) by mouth daily. 90 tablet 3   levothyroxine (SYNTHROID) 137 MCG tablet Take 137 mcg by mouth daily before breakfast.     Magnesium 400 MG TABS Take 400 mg by mouth every evening.     nitroGLYCERIN (NITROSTAT) 0.4 MG SL tablet Place 1 tablet (0.4 mg total) under the tongue every 5 (five) minutes as needed for chest pain. 30 tablet 6   XARELTO 15 MG TABS tablet TAKE 1 TABLET(15 MG) BY MOUTH DAILY WITH  SUPPER 90 tablet 1   amLODipine (NORVASC) 10 MG tablet Take 1 tablet (10 mg total) by mouth daily. 90 tablet 3   No current facility-administered medications for this visit.    Allergies:   Ace inhibitors, Beta adrenergic blockers, and Statins    Social History:  The patient  reports that he quit smoking about 40 years ago. His smoking use included cigarettes. He has a 30.00 pack-year smoking history. He has never used smokeless tobacco. He reports that he does not currently use alcohol after a past usage of about 2.0 standard drinks of alcohol per week. He reports that he does not use drugs.   Family History:  The patient's family history includes Healthy in his daughter, sister, and son; Heart attack in his father; Other in his mother.    ROS:  Please see the history of present illness.   Otherwise, review of systems are positive for none.   All other systems are reviewed and negative.    PHYSICAL EXAM: VS:  BP 126/82   Pulse 72   Ht 5' 8"  (1.727 m)   Wt 160 lb 12.8 oz (72.9 kg)   SpO2 100%   BMI 24.45 kg/m  , BMI Body mass index is 24.45 kg/m. GEN: Well nourished, well developed, in no acute distress  HEENT: normal  Neck: no JVD, carotid bruits, or masses Cardiac: RRR; gr 1/6 systolic murmur RUSB, rubs, or gallops,no edema  Respiratory:  clear to auscultation bilaterally, normal work of breathing GI: soft, nontender, nondistended, + BS MS: no deformity or atrophy. Pedal pulses are excellent. Skin: lot of bruising on arms.  Neuro:  Strength and sensation are intact Psych: euthymic mood, full affect   EKG:  EKG is not ordered today.      Lab Results  Component Value Date   WBC 6.0 11/03/2021   HGB 12.3 (L) 11/03/2021   HCT 37.5 (L) 11/03/2021   PLT 122 (L) 11/03/2021   GLUCOSE 84 11/21/2021   CHOL 128 06/05/2019   TRIG 109 06/05/2019   HDL 41 06/05/2019   LDLCALC 67 06/05/2019   ALT 23 11/13/2021   AST 22 11/13/2021   NA 140 11/21/2021   K 4.7 11/21/2021    CL 105 11/21/2021   CREATININE 1.20 11/21/2021   BUN 29 (H) 11/21/2021   CO2 22 11/21/2021   TSH 3.742 10/14/2021   Dated 01/12/20: cholesterol 135, triglycerides 93, HDL 36, LDL 80.TSH normal.  Dated 11/17/20: cholesterol 152, triglycerides 84, HDL  44, LDL 91. UA, chemistries normal  Dated 01/16/21: cholesterol 127, triglycerides 74, HDL 39, LDL 73.    Wt Readings from Last 3 Encounters:  05/08/22 160 lb 12.8 oz (72.9 kg)  03/02/22 156 lb 1.4 oz (70.8 kg)  01/04/22 161 lb 9.6 oz (73.3 kg)      Other studies Reviewed: Additional studies/ records that were reviewed today include:   Echo 10/22/16: Study Conclusions   - Left ventricle: The cavity size was normal. There was moderate   concentric hypertrophy. Systolic function was normal. The   estimated ejection fraction was in the range of 55% to 60%. Wall   motion was normal; there were no regional wall motion   abnormalities. Features are consistent with a pseudonormal left   ventricular filling pattern, with concomitant abnormal relaxation   and increased filling pressure (grade 2 diastolic dysfunction). - Aortic valve: There was mild regurgitation. Valve area (VTI):   2.17 cm^2. - Mitral valve: Calcified annulus. There was mild regurgitation. - Left atrium: The atrium was moderately dilated. - Pulmonary arteries: PA peak pressure: 32 mm Hg (S).    Echo 08/21/18: Study Conclusions   - Left ventricle: The cavity size was normal. Wall thickness was   increased in a pattern of mild LVH. Mid to apical inferior   hypokinesis. Hypokinesis of the apex. The estimated ejection   fraction was 45%. Features are consistent with a pseudonormal   left ventricular filling pattern, with concomitant abnormal   relaxation and increased filling pressure (grade 2 diastolic   dysfunction). - Aortic valve: Trileaflet; moderately calcified leaflets. There   was mild stenosis. There was moderate eccentric regurgitation.   Mean gradient (S): 10 mm  Hg. Valve area (VTI): 1.73 cm^2. - Mitral valve: Moderately calcified annulus. Mildly calcified   leaflets. There is some degree of restriction of the posterior   leaflet. There was moderate to severe regurgitation. - Left atrium: The atrium was moderately dilated. - Right ventricle: The cavity size was normal. Systolic function   was normal. - Right atrium: The atrium was moderately to severely dilated. - Tricuspid valve: There was moderate regurgitation. Peak RV-RA   gradient 39 mmHg. - Pulmonary arteries: PA peak pressure: 47 mm Hg (S). - Systemic veins: IVC measured 2.5 cm with > 50% respirophasic   variation, suggesting RA pressure 8 mmHg.   Impressions:   - Normal LV size with mild LV hypertrophy. EF 45% with mid to   apical inferior hypokinesis and hypokinesis of the apex. Moderate   diastolic dysfunction. Normal RV size and systolic function.   Biatrial enlargement. Moderate to severe mitral regurgitation   with restriction of the posterior leaflet. Mild aortic stenosis   with moderate eccentric regurgitation. Mild pulmonary   hypertension.    Cardiac cath/PCI: 08/21/18: CORONARY STENT INTERVENTION  LEFT HEART CATH AND CORONARY ANGIOGRAPHY  Conclusion     Prox RCA lesion is 95% stenosed. Ost Cx lesion is 60% stenosed. Prox Cx lesion is 60% stenosed. Prox LAD lesion is 100% stenosed. A drug-eluting stent was successfully placed. Post intervention, there is a 0% residual stenosis. There is moderate left ventricular systolic dysfunction. LV end diastolic pressure is mildly elevated. The left ventricular ejection fraction is 35-45% by visual estimate.   Marcus SCHANZ Sr. is a 88 y.o. male      [id number] LOCATION:  FACILITY: [facility]  PHYSICIAN: Quay Burow, M.D. May 04, 1932     DATE OF PROCEDURE:  08/21/2018   DATE OF DISCHARGE:  CARDIAC CATHETERIZATION / PCI DES RCA       History obtained from chart review.Mr. Asbridge is an 58M with paroxysmal  atrial fibrillation, myasthenia gravis, hyperlipidemia and thrombocytopenia here with NSTEMI.      PROCEDURE DESCRIPTION:    The patient was brought to the second floor [place] Cardiac cath lab in the postabsorptive state. He was premedicated with IV Versed and fentanyl. His right wristwas prepped and shaved in usual sterile fashion. Xylocaine 1% was used for local anesthesia. A 6 French sheath was inserted into the right radial artery using standard Seldinger technique. The patient received 4000units  of heparin intravenously.  A 5 [place] TIG catheter and pigtail catheters were used for selective coronary angiography and left ventriculography respectively.  Isovue dye was used for the entirety of the case.  Retrograde aorta, left ventricular and pullback pressures were recorded.  Radial cocktail was administered via the SideArm sheath.   Patient received Brilinta 180 mg p.o. followed by Angiomax bolus and infusion with a therapeutic ACT demonstrated.  Using a 6 [place] JR4 guide catheter, 0.14 pro-water guidewire and a 2 mm x 12 mm balloon predilatation was performed to the proximal dominant RCA.  I then placed a 3 mm x 60 mm long Synergy drug-eluting stent across the diseased segment and deployed at 14 to 16 atm.  I then postdilated with a 3.25 x 12 mm long noncompliant balloon up to 16 atm (3.3 mm) resulting reduction with 95% ulcerated proximal dominant RCA plaque to 0% residual.  There was distal spasm resulting in ST segment elevation which resolved with administration of intracoronary nitroglycerin.   I then attempted to cross the mid LAD CTO using a 6 [place] XB LAD 4 cm curve guide catheter, pro-water and whisper wire and 1.5 mm balloon.  I did ultimately cross the lesion but I did not think I was intraluminal and ultimately aborted the procedure.     IMPRESSION: Successful proximal RCA PCI and drug-eluting stenting using a synergy drug-eluting stent postdilated to 3.3 mm with unsuccessful  attempt at crossing mid LAD CTO.  His LV function is moderately reduced in the 40% range.  His LAD CTO can be treated in a staged fashion electively.  The sheath was removed and a TR band was placed on the right wrist to achieve patent hemostasis.  The patient left the lab in stable condition.   Quay Burow. MD, [facility] 08/21/2018 5:20 PM         Recommend uninterrupted dual antiplatelet therapy with Aspirin 60m daily and Ticagrelor 933mtwice daily for a minimum of 12 months (ACS - Class I recommendation).   Echo 03/07/20: IMPRESSIONS     1. Left ventricular ejection fraction, by estimation, is 60 to 65%. The  left ventricle has normal function. The left ventricle has no regional  wall motion abnormalities. Left ventricular diastolic function could not  be evaluated.   2. Right ventricular systolic function is normal. The right ventricular  size is normal. There is mildly elevated pulmonary artery systolic  pressure.   3. Left atrial size was severely dilated.   4. Right atrial size was severely dilated.   5. The mitral valve is normal in structure. Mild mitral valve  regurgitation. No evidence of mitral stenosis.   6. Tricuspid valve regurgitation is moderate.   7. The aortic valve is grossly normal. Aortic valve regurgitation is  mild. No aortic stenosis is present.   8. Aortic dilatation noted. There is mild dilatation of the ascending  aorta measuring 39 mm.   Echocardiogram 02/03/2021  1. Left ventricular ejection fraction, by estimation, is 60 to 65%. The  left ventricle has normal function. The left ventricle has no regional  wall motion abnormalities. There is mild left ventricular hypertrophy.  Left ventricular diastolic function  could not be evaluated.   2. Right ventricular systolic function is normal. The right ventricular  size is normal. There is normal pulmonary artery systolic pressure. The  estimated right ventricular systolic pressure is 80.0 mmHg.   3. Left  atrial size was moderately dilated.   4. Right atrial size was moderately dilated.   5. The mitral valve is degenerative. Mild mitral valve regurgitation.  Moderate mitral annular calcification.   6. The tricuspid valve is abnormal. Tricuspid valve regurgitation is  moderate.   7. The aortic valve is tricuspid. Aortic valve regurgitation is mild to  moderate. Mild aortic valve stenosis. Aortic valve area, by VTI measures  1.70 cm. Aortic valve mean gradient measures 15.2 mmHg. Aortic valve Vmax  measures 2.62 m/s.   8. Aortic dilatation noted. There is mild dilatation of the ascending  aorta, measuring 40 mm.   9. The inferior vena cava is dilated in size with >50% respiratory  variability, suggesting right atrial pressure of 8 mmHg.  Comparison(s): Changes from prior study are noted. 03/07/2020: LVEF 60-65%,  severe biatrial enlargement, moderate TR, mild AI.   SPECT 10/05/2021  Findings are consistent with ischemia. The study is intermediate risk.   No ST deviation was noted.   LV perfusion is abnormal. There is evidence of ischemia. There is no evidence of infarction. Defect 1: There is a medium defect with severe reduction in uptake present in the apical to mid anteroseptal location(s) that is reversible. Viability is present. There is abnormal wall motion in the defect area. Consistent with ischemia.   Left ventricular function is abnormal. Global function is mildly reduced. There was a single regional abnormality. Nuclear stress EF: 54 %. The left ventricular ejection fraction is mildly decreased (45-54%). End diastolic cavity size is normal.   Prior study not available for comparison.   Echo 10/11/21: IMPRESSIONS     1. Left ventricular ejection fraction, by estimation, is 55%. The left  ventricle has normal function. The left ventricle demonstrates regional  wall motion abnormalities with severe apical and apical septal  hypokinesis. There is mild left ventricular  hypertrophy.  Left ventricular diastolic parameters are consistent with  Grade II diastolic dysfunction (pseudonormalization). The average left  ventricular global longitudinal strain is -16.8 %. The global longitudinal  strain is abnormal.   2. Right ventricular systolic function is normal. The right ventricular  size is mildly enlarged. There is severely elevated pulmonary artery  systolic pressure. The estimated right ventricular systolic pressure is  34.9 mmHg.   3. Left atrial size was moderately dilated.   4. Right atrial size was severely dilated.   5. The mitral valve is abnormal. Mild to moderate mitral valve  regurgitation. No evidence of mitral stenosis. Moderate mitral annular  calcification.   6. Tricuspid valve regurgitation is moderate to severe.   7. The aortic valve is tricuspid. Aortic valve regurgitation is moderate.  Mild aortic valve stenosis. Aortic valve mean gradient measures 14.0 mmHg.   8. Aortic dilatation noted. There is mild dilatation of the ascending  aorta, measuring 40 mm.   9. The inferior vena cava is dilated in size with <50% respiratory  variability, suggesting right atrial pressure of 15 mmHg.  Comparison(s): 02/13/21 EF 60-65%. PA pressure 45mHg. Mild AS 174mg mean  PG, 2819m peak PG.   Cardiac cath 10/13/21:  CORONARY STENT INTERVENTION  LEFT HEART CATH AND CORONARY ANGIOGRAPHY   Conclusion      Prox RCA-1 lesion is 99% stenosed.   Ost Cx to Mid Cx lesion is 60% stenosed.   Mid LAD lesion is 100% stenosed.   Previously placed Prox RCA-2 stent (unknown type) is  widely patent.   A drug-eluting stent was successfully placed using a SYNERGY XD 3.0X16.   Post intervention, there is a 0% residual stenosis.   Large dominant RCA with severe proximal stenosis. The proximal stent is patent.  Successful PTCA/DES x 1 proximal RCA Moderate disease in the proximal and mid Circumflex Functional chronic total occlusion of the calcified mid LAD, unchanged from  last cath in 2019. The distal LAD fills faintly from antegrade flow and from right to left collaterals.    Recommendations: Will continue ASA and Plavix tomorrow. If his Eliquis is restarted tomorrow, I think the ASA can be stopped and he can be continued on Plavix along with Eliquis. Monitor overnight given elevated BP before the procedure and bleeding risk.   Coronary Diagrams  Diagnostic Dominance: Right  Intervention     ASSESSMENT AND PLAN:  1.  CAD with s/p NSTEMI in November 2019 secondary to ruptured plaque lesion in the RCA. S/p DES. He has CTO of the mid LAD but had been asymptomatic from this. S/p stenting of the proximal RCA in Jan 2023 proximal to prior stent. Modest 60% disease in the LCx. CTO of the LAD unchanged.  Intolerant of Brilinta in the past. On Plavix and anticoagulation with Xarelto. He does have persistent DOE. Given concern with pulmonary HTN I think repeat cardiac cath including right heart cath is appropriate. We had previously discussed CTO PCI of the LAD in the past but this never really caused symptoms and I think it alone is not responsible for his current symptoms. The procedure and risks were reviewed including but not limited to death, myocardial infarction, stroke, arrythmias, bleeding, transfusion, emergency surgery, dye allergy, or renal dysfunction. The patient voices understanding and is agreeable to proceed.   2. Hyperlipidemia. Will update labs. History of intolerance to statins. Will consider alternatives.   3. Chronic diastolic CHF.exacerbated by Atrial flutter. On lasix. Will assess hemodynamics at cath.   4. HTN. On amlodipine 10 mg daily. Well controlled.   5. Myastenia gravis in remission  6. Hypothyroidism. Check TSH  7. Atrial flutter - recurrent. Patient has had several cardioversions now with most recent in February with early recurrence. Rate is well controlled. Looking at his history it is clear that he is quite symptomatic in Atrial  flutter and I suspect his current symptoms are largely related to this.  On no AV nodal blocking agents.  Now on  Xarelto 15 mg daily. No ASA or NSAIDs. We again discussed options for treating atrial flutter including AAD therapy or ablation. At his age I don't think he is a great candidate for ablation unless failed AAD therapy. Discussed options for AAD therapy including amiodarone versus Tikosyn. He is reluctant but will readdress after cardiac cath.   8. Pulmonary HTN. I suspect this is largely related to chronic CHF. No prior history of significant lung disease. Pulmonary embolic disease is unlikely on chronic anticoagulation. Will directly measure pulmonary pressures at cath  9. Valvular heart disease. Mild to moderate MR, moderate AI, mild AS and mod to  severe TR noted on prior Echo. Will assess hemodynamics at cath.    Current medicines are reviewed at length with the patient today.  The patient does not have concerns regarding medicines.  The following changes have been made:  no change  Follow up after cardiac cath.  Signed, Jillyan Plitt Martinique, MD  05/08/2022 10:00 AM    [facility] [address] [place], [place], [address] Phone [phone], Fax [phone]

## 2022-05-08 ENCOUNTER — Encounter: Payer: Self-pay | Admitting: Cardiology

## 2022-05-08 ENCOUNTER — Ambulatory Visit (INDEPENDENT_AMBULATORY_CARE_PROVIDER_SITE_OTHER): Payer: Medicare Other | Admitting: Cardiology

## 2022-05-08 ENCOUNTER — Other Ambulatory Visit: Payer: Self-pay | Admitting: Cardiology

## 2022-05-08 ENCOUNTER — Ambulatory Visit: Payer: Medicare Other | Admitting: Internal Medicine

## 2022-05-08 VITALS — BP 126/82 | HR 72 | Ht 68.0 in | Wt 160.8 lb

## 2022-05-08 DIAGNOSIS — I272 Pulmonary hypertension, unspecified: Secondary | ICD-10-CM | POA: Diagnosis not present

## 2022-05-08 DIAGNOSIS — I2510 Atherosclerotic heart disease of native coronary artery without angina pectoris: Secondary | ICD-10-CM | POA: Diagnosis not present

## 2022-05-08 DIAGNOSIS — R0602 Shortness of breath: Secondary | ICD-10-CM | POA: Diagnosis not present

## 2022-05-08 DIAGNOSIS — I484 Atypical atrial flutter: Secondary | ICD-10-CM | POA: Diagnosis not present

## 2022-05-08 DIAGNOSIS — R5382 Chronic fatigue, unspecified: Secondary | ICD-10-CM

## 2022-05-08 DIAGNOSIS — I4819 Other persistent atrial fibrillation: Secondary | ICD-10-CM | POA: Diagnosis not present

## 2022-05-08 DIAGNOSIS — I10 Essential (primary) hypertension: Secondary | ICD-10-CM | POA: Diagnosis not present

## 2022-05-08 DIAGNOSIS — I5042 Chronic combined systolic (congestive) and diastolic (congestive) heart failure: Secondary | ICD-10-CM

## 2022-05-08 DIAGNOSIS — R0609 Other forms of dyspnea: Secondary | ICD-10-CM

## 2022-05-08 MED ORDER — SODIUM CHLORIDE 0.9% FLUSH: 3.0000 mL | Freq: Two times a day (BID) | INTRAVENOUS | Status: DC

## 2022-05-08 MED ORDER — AMLODIPINE BESYLATE 10 MG PO TABS: 10.0000 mg | ORAL_TABLET | Freq: Every day | ORAL | 3 refills | Status: AC

## 2022-05-08 NOTE — Patient Instructions (Addendum)
Medication Instructions:  Continue same medications *If you need a refill on your cardiac medications before your next appointment, please call your pharmacy*   Lab Work: Cmet,cbc,lipid panel,tsh,bnp today   Testing/Procedures: Cardiac Cath scheduled Friday 8/11  Follow instructions below   Follow-Up: At [facility], you and your health needs are our priority.  As part of our continuing mission to provide you with exceptional heart care, we have created designated Provider Care Teams.  These Care Teams include your primary Cardiologist (physician) and Advanced Practice Providers (APPs -  Physician Assistants and Nurse Practitioners) who all work together to provide you with the care you need, when you need it.  We recommend signing up for the patient portal called "MyChart".  Sign up information is provided on this After Visit Summary.  MyChart is used to connect with patients for Virtual Visits (Telemedicine).  Patients are able to view lab/test results, encounter notes, upcoming appointments, etc.  Non-urgent messages can be sent to your provider as well.   To learn more about what you can do with MyChart, go to [email or web address].       Your next appointment:  After Cath    The format for your next appointment: Office    Provider:  [place] [facility] [address] Dept: [phone] Loc: [place].  05/08/2022  You are scheduled for a Cardiac Catheterization on Friday, August 11 with Dr. Peter Martinique.  1. Please arrive at the Main Entrance A at [facility]: [place], [place] [address] at 8:30 AM (This time is two hours before your procedure to ensure your preparation). Free valet parking service is available.   Special note: Every effort is made to have your procedure done on time. Please understand that  emergencies sometimes delay scheduled procedures.  2. Diet: Do not eat solid foods after midnight.  You may have clear liquids until 5 AM upon the day of the procedure.  3. Labs: You will need to have blood drawn today 8/8.  4. Medication instructions in preparation for your procedure:      Hold Xarelto 2 days before cath Start holding Wed 8/9 & Thurs 8/10.  Hold Furosemide morning of cath.     On the morning of your procedure, take Aspirin and Plavix/Clopidogrel and any morning medicines NOT listed above.  You may use sips of water.  5. Plan to go home the same day, you will only stay overnight if medically necessary. 6. You MUST have a responsible adult to drive you home. 7. An adult MUST be with you the first 24 hours after you arrive home. 8. Bring a current list of your medications, and the last time and date medication taken. 9. Bring ID and current insurance cards. 10.Please wear clothes that are easy to get on and off and wear slip-on shoes.  Thank you for allowing [place] to care for you!   -- [facility]   Important Information About Sugar

## 2022-05-09 LAB — CBC WITH DIFFERENTIAL/PLATELET
Basophils Absolute: 0.1 10*3/uL (ref 0.0–0.2)
Basos: 1 %
EOS (ABSOLUTE): 0.2 10*3/uL (ref 0.0–0.4)
Eos: 3 %
Hematocrit: 36.5 % — ABNORMAL LOW (ref 37.5–51.0)
Hemoglobin: 12.2 g/dL — ABNORMAL LOW (ref 13.0–17.7)
Immature Grans (Abs): 0 10*3/uL (ref 0.0–0.1)
Immature Granulocytes: 0 %
Lymphocytes Absolute: 0.9 10*3/uL (ref 0.7–3.1)
Lymphs: 14 %
MCH: 31.6 pg (ref 26.6–33.0)
MCHC: 33.4 g/dL (ref 31.5–35.7)
MCV: 95 fL (ref 79–97)
Monocytes Absolute: 0.5 10*3/uL (ref 0.1–0.9)
Monocytes: 8 %
Neutrophils Absolute: 5 10*3/uL (ref 1.4–7.0)
Neutrophils: 74 %
Platelets: 140 10*3/uL — ABNORMAL LOW (ref 150–450)
RBC: 3.86 x10E6/uL — ABNORMAL LOW (ref 4.14–5.80)
RDW: 13.4 % (ref 11.6–15.4)
WBC: 6.7 10*3/uL (ref 3.4–10.8)

## 2022-05-09 LAB — LIPID PANEL
Chol/HDL Ratio: 2.7 ratio (ref 0.0–5.0)
Cholesterol, Total: 149 mg/dL (ref 100–199)
HDL: 55 mg/dL (ref >39)
LDL Chol Calc (NIH): 82 mg/dL (ref 0–99)
Triglycerides: 56 mg/dL (ref 0–149)
VLDL Cholesterol Cal: 12 mg/dL (ref 5–40)

## 2022-05-09 LAB — COMPREHENSIVE METABOLIC PANEL
ALT: 12 IU/L (ref 0–44)
AST: 18 IU/L (ref 0–40)
Albumin/Globulin Ratio: 1.9 (ref 1.2–2.2)
Albumin: 4.5 g/dL (ref 3.7–4.7)
Alkaline Phosphatase: 96 IU/L (ref 44–121)
BUN/Creatinine Ratio: 29 — ABNORMAL HIGH (ref 10–24)
BUN: 34 mg/dL — ABNORMAL HIGH (ref 8–27)
Bilirubin Total: 0.4 mg/dL (ref 0.0–1.2)
CO2: 20 mmol/L (ref 20–29)
Calcium: 9.1 mg/dL (ref 8.6–10.2)
Chloride: 104 mmol/L (ref 96–106)
Creatinine, Ser: 1.18 mg/dL (ref 0.76–1.27)
Globulin, Total: 2.4 g/dL (ref 1.5–4.5)
Glucose: 86 mg/dL (ref 70–99)
Potassium: 4.6 mmol/L (ref 3.5–5.2)
Sodium: 140 mmol/L (ref 134–144)
Total Protein: 6.9 g/dL (ref 6.0–8.5)
eGFR: 60 mL/min/{1.73_m2} (ref >59)

## 2022-05-09 LAB — TSH: TSH: 0.107 u[IU]/mL — ABNORMAL LOW (ref 0.450–4.500)

## 2022-05-09 LAB — BRAIN NATRIURETIC PEPTIDE: BNP: 247.7 pg/mL — ABNORMAL HIGH (ref 0.0–100.0)

## 2022-05-11 ENCOUNTER — Encounter (HOSPITAL_COMMUNITY)
Admission: RE | Disposition: A | Discharge status: Home / Self Care | Payer: Self-pay | Source: Home / Self Care | Attending: Cardiology

## 2022-05-11 ENCOUNTER — Other Ambulatory Visit: Payer: Self-pay

## 2022-05-11 ENCOUNTER — Encounter (HOSPITAL_COMMUNITY): Payer: Self-pay | Admitting: Cardiology

## 2022-05-11 ENCOUNTER — Ambulatory Visit (HOSPITAL_COMMUNITY)
Admission: RE | Admit: 2022-05-11 | Discharge: 2022-05-11 | Disposition: A | Discharge status: Home / Self Care | Payer: Medicare Other | Attending: Cardiology | Admitting: Cardiology

## 2022-05-11 DIAGNOSIS — Z955 Presence of coronary angioplasty implant and graft: Secondary | ICD-10-CM | POA: Insufficient documentation

## 2022-05-11 DIAGNOSIS — Z79899 Other long term (current) drug therapy: Secondary | ICD-10-CM | POA: Insufficient documentation

## 2022-05-11 DIAGNOSIS — Z7901 Long term (current) use of anticoagulants: Secondary | ICD-10-CM | POA: Diagnosis not present

## 2022-05-11 DIAGNOSIS — E039 Hypothyroidism, unspecified: Secondary | ICD-10-CM | POA: Diagnosis not present

## 2022-05-11 DIAGNOSIS — R0609 Other forms of dyspnea: Secondary | ICD-10-CM | POA: Insufficient documentation

## 2022-05-11 DIAGNOSIS — I2582 Chronic total occlusion of coronary artery: Secondary | ICD-10-CM | POA: Diagnosis not present

## 2022-05-11 DIAGNOSIS — I2510 Atherosclerotic heart disease of native coronary artery without angina pectoris: Secondary | ICD-10-CM | POA: Diagnosis not present

## 2022-05-11 DIAGNOSIS — I4892 Unspecified atrial flutter: Secondary | ICD-10-CM | POA: Insufficient documentation

## 2022-05-11 DIAGNOSIS — I252 Old myocardial infarction: Secondary | ICD-10-CM | POA: Diagnosis not present

## 2022-05-11 DIAGNOSIS — E785 Hyperlipidemia, unspecified: Secondary | ICD-10-CM | POA: Diagnosis not present

## 2022-05-11 DIAGNOSIS — I110 Hypertensive heart disease with heart failure: Secondary | ICD-10-CM | POA: Diagnosis not present

## 2022-05-11 DIAGNOSIS — I5032 Chronic diastolic (congestive) heart failure: Secondary | ICD-10-CM | POA: Diagnosis not present

## 2022-05-11 DIAGNOSIS — Z7902 Long term (current) use of antithrombotics/antiplatelets: Secondary | ICD-10-CM | POA: Insufficient documentation

## 2022-05-11 DIAGNOSIS — I083 Combined rheumatic disorders of mitral, aortic and tricuspid valves: Secondary | ICD-10-CM | POA: Insufficient documentation

## 2022-05-11 HISTORY — PX: RIGHT/LEFT HEART CATH AND CORONARY ANGIOGRAPHY: CATH118266

## 2022-05-11 LAB — POCT I-STAT EG7
Acid-base deficit: 6 mmol/L — ABNORMAL HIGH (ref 0.0–2.0)
Acid-base deficit: 6 mmol/L — ABNORMAL HIGH (ref 0.0–2.0)
Bicarbonate: 19.3 mmol/L — ABNORMAL LOW (ref 20.0–28.0)
Bicarbonate: 19.4 mmol/L — ABNORMAL LOW (ref 20.0–28.0)
Calcium, Ion: 1.18 mmol/L (ref 1.15–1.40)
Calcium, Ion: 1.19 mmol/L (ref 1.15–1.40)
HCT: 30 % — ABNORMAL LOW (ref 39.0–52.0)
HCT: 30 % — ABNORMAL LOW (ref 39.0–52.0)
Hemoglobin: 10.2 g/dL — ABNORMAL LOW (ref 13.0–17.0)
Hemoglobin: 10.2 g/dL — ABNORMAL LOW (ref 13.0–17.0)
O2 Saturation: 65 %
O2 Saturation: 68 %
Potassium: 3.7 mmol/L (ref 3.5–5.1)
Potassium: 3.8 mmol/L (ref 3.5–5.1)
Sodium: 142 mmol/L (ref 135–145)
Sodium: 142 mmol/L (ref 135–145)
TCO2: 20 mmol/L — ABNORMAL LOW (ref 22–32)
TCO2: 21 mmol/L — ABNORMAL LOW (ref 22–32)
pCO2, Ven: 36.1 mmHg — ABNORMAL LOW (ref 44–60)
pCO2, Ven: 36.3 mmHg — ABNORMAL LOW (ref 44–60)
pH, Ven: 7.334 (ref 7.25–7.43)
pH, Ven: 7.339 (ref 7.25–7.43)
pO2, Ven: 36 mmHg (ref 32–45)
pO2, Ven: 37 mmHg (ref 32–45)

## 2022-05-11 LAB — POCT I-STAT 7, (LYTES, BLD GAS, ICA,H+H)
Acid-base deficit: 6 mmol/L — ABNORMAL HIGH (ref 0.0–2.0)
Bicarbonate: 17.7 mmol/L — ABNORMAL LOW (ref 20.0–28.0)
Calcium, Ion: 1.19 mmol/L (ref 1.15–1.40)
HCT: 30 % — ABNORMAL LOW (ref 39.0–52.0)
Hemoglobin: 10.2 g/dL — ABNORMAL LOW (ref 13.0–17.0)
O2 Saturation: 97 %
Potassium: 3.9 mmol/L (ref 3.5–5.1)
Sodium: 140 mmol/L (ref 135–145)
TCO2: 19 mmol/L — ABNORMAL LOW (ref 22–32)
pCO2 arterial: 29.6 mmHg — ABNORMAL LOW (ref 32–48)
pH, Arterial: 7.385 (ref 7.35–7.45)
pO2, Arterial: 86 mmHg (ref 83–108)

## 2022-05-11 SURGERY — RIGHT/LEFT HEART CATH AND CORONARY ANGIOGRAPHY
Anesthesia: LOCAL

## 2022-05-11 MED ORDER — SODIUM CHLORIDE 0.9% FLUSH: 3.0000 mL | INTRAVENOUS | Status: DC | PRN

## 2022-05-11 MED ORDER — SODIUM CHLORIDE 0.9 % IV SOLN: INTRAVENOUS | Status: DC

## 2022-05-11 MED ORDER — MIDAZOLAM HCL 2 MG/2ML IJ SOLN
INTRAMUSCULAR | Status: AC
  Filled 2022-05-11: qty 2

## 2022-05-11 MED ORDER — MIDAZOLAM HCL 2 MG/2ML IJ SOLN
INTRAMUSCULAR | Status: DC | PRN
  Administered 2022-05-11: 1 mg via INTRAVENOUS

## 2022-05-11 MED ORDER — IOHEXOL 350 MG/ML SOLN
INTRAVENOUS | Status: DC | PRN
  Administered 2022-05-11: 40 mL

## 2022-05-11 MED ORDER — HEPARIN SODIUM (PORCINE) 1000 UNIT/ML IJ SOLN
INTRAMUSCULAR | Status: AC
  Filled 2022-05-11: qty 10

## 2022-05-11 MED ORDER — LIDOCAINE HCL (PF) 1 % IJ SOLN
INTRAMUSCULAR | Status: AC
  Filled 2022-05-11: qty 30

## 2022-05-11 MED ORDER — HEPARIN (PORCINE) IN NACL 1000-0.9 UT/500ML-% IV SOLN
INTRAVENOUS | Status: DC | PRN
  Administered 2022-05-11 (×2): 500 mL

## 2022-05-11 MED ORDER — VERAPAMIL HCL 2.5 MG/ML IV SOLN
INTRAVENOUS | Status: AC
  Filled 2022-05-11: qty 2

## 2022-05-11 MED ORDER — ONDANSETRON HCL 4 MG/2ML IJ SOLN: 4.0000 mg | Freq: Four times a day (QID) | INTRAMUSCULAR | Status: DC | PRN

## 2022-05-11 MED ORDER — VERAPAMIL HCL 2.5 MG/ML IV SOLN
INTRAVENOUS | Status: DC | PRN
  Administered 2022-05-11: 10 mL via INTRA_ARTERIAL

## 2022-05-11 MED ORDER — LIDOCAINE HCL (PF) 1 % IJ SOLN
INTRAMUSCULAR | Status: DC | PRN
  Administered 2022-05-11: 2 mL

## 2022-05-11 MED ORDER — SODIUM CHLORIDE 0.9 % WEIGHT BASED INFUSION
1.0000 mL/kg/h | INTRAVENOUS | Status: DC
Start: 2022-05-11 — End: 2022-05-11

## 2022-05-11 MED ORDER — SODIUM CHLORIDE 0.9 % IV SOLN: 250.0000 mL | INTRAVENOUS | Status: DC | PRN

## 2022-05-11 MED ORDER — ACETAMINOPHEN 325 MG PO TABS: 650.0000 mg | ORAL_TABLET | ORAL | Status: DC | PRN

## 2022-05-11 MED ORDER — HEPARIN SODIUM (PORCINE) 1000 UNIT/ML IJ SOLN
INTRAMUSCULAR | Status: DC | PRN
  Administered 2022-05-11: 4000 [IU] via INTRAVENOUS

## 2022-05-11 MED ORDER — ASPIRIN 81 MG PO CHEW
81.0000 mg | CHEWABLE_TABLET | ORAL | Status: AC
  Administered 2022-05-11: 81 mg via ORAL

## 2022-05-11 MED ORDER — HEPARIN (PORCINE) IN NACL 1000-0.9 UT/500ML-% IV SOLN
INTRAVENOUS | Status: AC
  Filled 2022-05-11: qty 1000

## 2022-05-11 MED ORDER — SODIUM CHLORIDE 0.9% FLUSH: 3.0000 mL | Freq: Two times a day (BID) | INTRAVENOUS | Status: DC

## 2022-05-11 SURGICAL SUPPLY — 12 items
BAND ZEPHYR COMPRESS 30 LONG (HEMOSTASIS) ×1 IMPLANT
CATH 5FR JL3.5 JR4 ANG PIG MP (CATHETERS) ×1 IMPLANT
CATH BALLN WEDGE 5F 110CM (CATHETERS) ×1 IMPLANT
GLIDESHEATH SLEND SS 6F .021 (SHEATH) ×1 IMPLANT
GUIDEWIRE .025 260CM (WIRE) ×1 IMPLANT
GUIDEWIRE INQWIRE 1.5J.035X260 (WIRE) IMPLANT
INQWIRE 1.5J .035X260CM (WIRE) ×2
KIT HEART LEFT (KITS) ×2 IMPLANT
PACK CARDIAC CATHETERIZATION (CUSTOM PROCEDURE TRAY) ×2 IMPLANT
SHEATH GLIDE SLENDER 4/5FR (SHEATH) ×1 IMPLANT
TRANSDUCER W/STOPCOCK (MISCELLANEOUS) ×2 IMPLANT
TUBING CIL FLEX 10 FLL-RA (TUBING) ×2 IMPLANT

## 2022-05-11 NOTE — Progress Notes (Signed)
Patient was given discharge instructions. He verbalized understanding. 

## 2022-05-11 NOTE — Progress Notes (Signed)
TR BAND REMOVAL  LOCATION:    right radial  DEFLATED PER PROTOCOL:    Yes.    TIME BAND OFF / DRESSING APPLIED:    13:00   SITE UPON ARRIVAL:    Level 0  SITE AFTER BAND REMOVAL:    Level 0  CIRCULATION SENSATION AND MOVEMENT:    Within Normal Limits   Yes.    COMMENTS:   No bleeding noted

## 2022-05-11 NOTE — Interval H&P Note (Signed)
History and Physical Interval Note:  05/11/2022 10:02 AM  Marcus [name] Sr.  has presented today for surgery, with the diagnosis of shortness of breath.  The various methods of treatment have been discussed with the patient and family. After consideration of risks, benefits and other options for treatment, the patient has consented to  Procedure(s): RIGHT/LEFT HEART CATH AND CORONARY ANGIOGRAPHY (N/A) as a surgical intervention.  The patient's history has been reviewed, patient examined, no change in status, stable for surgery.  I have reviewed the patient's chart and labs.  Questions were answered to the patient's satisfaction.    Cath Lab Visit (complete for each Cath Lab visit)  Clinical Evaluation Leading to the Procedure:   ACS: No.  Non-ACS:    Anginal Classification: CCS III  Anti-ischemic medical therapy: Minimal Therapy (1 class of medications)  Non-Invasive Test Results: No non-invasive testing performed  Prior CABG: No previous CABG       Collier [facility] 05/11/2022 10:02 AM

## 2022-05-11 NOTE — Discharge Instructions (Signed)

## 2022-05-16 ENCOUNTER — Encounter: Payer: Self-pay | Admitting: Cardiology

## 2022-05-16 DIAGNOSIS — L814 Other melanin hyperpigmentation: Secondary | ICD-10-CM | POA: Diagnosis not present

## 2022-05-16 DIAGNOSIS — D225 Melanocytic nevi of trunk: Secondary | ICD-10-CM | POA: Diagnosis not present

## 2022-05-16 DIAGNOSIS — L821 Other seborrheic keratosis: Secondary | ICD-10-CM | POA: Diagnosis not present

## 2022-05-16 DIAGNOSIS — D1801 Hemangioma of skin and subcutaneous tissue: Secondary | ICD-10-CM | POA: Diagnosis not present

## 2022-05-16 DIAGNOSIS — Z85828 Personal history of other malignant neoplasm of skin: Secondary | ICD-10-CM | POA: Diagnosis not present

## 2022-05-21 ENCOUNTER — Encounter: Payer: Self-pay | Admitting: Cardiology

## 2022-05-21 NOTE — Progress Notes (Signed)
Cardiology Office Note    Date:  05/25/2022   ID:  Marcus Kaufmann Sr., DOB Jan 06, 1932, MRN [id number]  PCP:  [name], MD  Cardiologist:   Elizabeth Paulsen Martinique, MD   No chief complaint on file.     History of Present Illness: Marcus CAREAGA Sr. is a 88 y.o. male who is seen for follow up atrial flutter and CHF. He has a history of myasthenia gravis and remote PAF in the setting of pneumonia July 2016.  He presented 08/20/2018 with a non-ST elevation MI.  08/21/2018 he underwent diagnostic catheterization and intervention with a DES to his RCA.   Patient did have a residual total LAD that Dr [name] attempted to cross but it appeared the wire got subintimal and so the procedure was aborted. He also has a 60% circumflex.  Echocardiogram showed an EF of 40% with moderate to severe MR.  Though his LVEDP at cath was normal he was felt to be volume overloaded. He was diuresed.  On initial follow up he complained of persistent SOB. Was seen by Dr. Melvyn Novas who felt this was not a pulmonary issue. Later switched from Brilinta to Plavix with significant improvement in symptoms. He was considered for potential CTO PCI of the LAD but since he was asymptomatic at that time this was not recommended.   He was seen in the ED on 03/01/20 after experiencing an episode of dyspnea and left arm pain while walking to his mailbox.  In the ED he was found to be in atrial flutter with 4:1 conduction and controlled rate. He was severely hypertensive. Potassium was elevated at 5.8. troponin was 15>18. CXR NAD. Admission was considered but patient refused. Losartan was stopped and he was started on amlodipine. Also placed on Xarelto. Repeat Echo done and showed improvement in LV function with normal EF. Only mild MR. Severe biatrial enlargement. After anticoagulation for 3-4 weeks he did undergo successful DCCV on 03/24/20. He was started on lasix 20 mg daily. Noted significant improvement in his edema.  He was seen by  Jory Sims NP in April 2022 with significant weight gain, swelling and BNP of >5000. He was found to be back in Atrial flutter and underwent DCCV on 02/14/21. Echo at that time showed normal EF. Mild MR, mild to mod AI, mild AS. Moderate biatrial enlargement. He was continued on lasix 40 mg daily and on follow up noted significant improvement in his symptoms.   In September he complained of positional dizziness. Heart monitor was placed showing NSR without Afib or flutter. Amlodipine dose was reduced.   In Dec and Jan he complained of exertional fatigue and continue dizziness and SOB. Nuclear stress test 10/05/21 was abnormal for ischemia showing medium defect with severe reduction in uptake present in the apical to mid anteroseptal location(s) that is reversible, viability present, abnormal wall motion, EF 54%. Echo 10/11/21 showed EF 55%, regional wall motion abnormalities with severe apical and apical septal hypokinesis, grade 2 DD, normal RV function, mildly enlarged RV, severely elevated PASP, moderate LAE, mild-moderate MR, moderate-severe TR, mild AS, mild dilation of ascending aorta.  Ecg was interpreted as NSR but in fact was atrial flutter with controlled response. He underwent cardiac cath showing large dominant RCA with severe proximal stenosis treated with DES, with moderate disease in prox and mid Cx, and functional chronic total occlusion of the calcified mid LAD, unchanged from last cath in 2019. Echo suggested severe pulmonary HTN but EDP at cath was normal  at 9 mm Hg. He was seen in the Afib clinic and subsequently had DCCV on 11/03/21. Initial follow up on 2/10 showed NSR but when seen in the AFib clinic on 2/13 he was back in Atrial flutter. The plan was to initiate AAD therapy but the patient was concerned about the potential side effects and declined.   On follow up today he reports he completed cardiac Rehab. Notes his HR never got above 70. He continues to complain of SOB with exertion-  just walking from kitchen to bedroom. Fatigues easily. Feels lightheaded. Weight has been stable. No increase in edema. Denies any chest pain but did not have chest pain in January when he had his stent. He has a lot of questions about his valves and pulmonary HTN  noted on Echo. Had actually seen by a CT surgeon in [place] who told him to focus on pulmonary HTN.   To evaluate further he underwent [place]. This showed normal LV filling and right heart pressures. No change in CTO of the LAD. RCA stents patent.   He still is limited by SOB. Brings records from [place] where HR never gets over 80. Sometimes gets into the 40s.   Past Medical History:  Diagnosis Date   A-fib [facility])    Acute on chronic combined systolic and diastolic CHF (congestive heart failure) ([place]) 08/22/2018   Acute on chronic respiratory failure ([place]) 04/15/2015   Anemia    Atrial flutter (HCC)    Bradycardia    CAD in native artery 08/22/2018   CAP (community acquired pneumonia) 04/13/2015   See cxr 04/13/2015 > admit [facility]     Complication of anesthesia    H/O hiatal hernia    Hyperlipidemia LDL goal <70 08/22/2018   Hypertension 08/22/2018   Hypothyroidism 04/13/2015   Myasthenia gravis ([place]) 04/13/2015   Obesity 04/13/2015   Paroxysmal A-fib ([place]) 04/16/2015   RBBB    S/P angioplasty with stent 08/21/18 DES to RCA 08/22/2018   Sepsis ([place]) 04/15/2015   Thrombocytopenia ([place])     Past Surgical History:  Procedure Laterality Date   AMPUTATION  06/11/2012   Procedure: AMPUTATION DIGIT;  Surgeon: [name], MD;  Location: [place];  Service: Orthopedics;  Laterality: Left;  revision of amputation   CARDIOVERSION N/A 03/24/2020   Procedure: CARDIOVERSION;  Surgeon: [name], [name], MD;  Location: [facility] ENDOSCOPY;  Service: Cardiovascular;  Laterality: N/A;   CARDIOVERSION N/A 02/14/2021   Procedure: CARDIOVERSION;  Surgeon: [name], MD;  Location: [place];  Service: Cardiovascular;  Laterality: N/A;    CARDIOVERSION N/A 11/03/2021   Procedure: CARDIOVERSION;  Surgeon: Buford Dresser, MD;  Location: [facility] ENDOSCOPY;  Service: Cardiovascular;  Laterality: N/A;   CORONARY STENT INTERVENTION N/A 08/21/2018   Procedure: CORONARY STENT INTERVENTION;  Surgeon: [name], MD;  Location: [place] CV LAB;  Service: Cardiovascular;  Laterality: N/A;   CORONARY STENT INTERVENTION N/A 10/13/2021   Procedure: CORONARY STENT INTERVENTION;  Surgeon: Burnell Blanks, MD;  Location: [place] CV LAB;  Service: Cardiovascular;  Laterality: N/A;   HERNIA REPAIR  1994   LEFT HEART CATH AND CORONARY ANGIOGRAPHY N/A 08/21/2018   Procedure: LEFT HEART CATH AND CORONARY ANGIOGRAPHY;  Surgeon: [name], MD;  Location: [place] CV LAB;  Service: Cardiovascular;  Laterality: N/A;   LEFT HEART CATH AND CORONARY ANGIOGRAPHY N/A 10/13/2021   Procedure: LEFT HEART CATH AND CORONARY ANGIOGRAPHY;  Surgeon: Burnell Blanks, MD;  Location: [place] CV LAB;  Service:  Cardiovascular;  Laterality: N/A;   ORIF SHOULDER FRACTURE  06/13/2012   Procedure: OPEN REDUCTION INTERNAL FIXATION (ORIF) SHOULDER FRACTURE;  Surgeon: [name], MD;  Location: [place];  Service: Orthopedics;  Laterality: Left;  LEFT SHOULDER OPEN GREATER TUBEROSITY ORIF   RIGHT/LEFT HEART CATH AND CORONARY ANGIOGRAPHY N/A 05/11/2022   Procedure: RIGHT/LEFT HEART CATH AND CORONARY ANGIOGRAPHY;  Surgeon: Martinique, Mikiah Durall M, MD;  Location: [place] CV LAB;  Service: Cardiovascular;  Laterality: N/A;   SHOULDER CLOSED REDUCTION  06/11/2012   Procedure: CLOSED MANIPULATION SHOULDER;  Surgeon: [name], MD;  Location: [place];  Service: Orthopedics;  Laterality: Left;     Current Outpatient Medications  Medication Sig Dispense Refill   amLODipine (NORVASC) 10 MG tablet Take 1 tablet (10 mg total) by mouth daily. 90 tablet 3   Carboxymethylcellulose Sod PF (THERATEARS PF) 0.25 % SOLN Apply to eye.     clopidogrel  (PLAVIX) 75 MG tablet Take 1 tablet by mouth daily.     furosemide (LASIX) 20 MG tablet Take 1 tablet (20 mg total) by mouth daily. 90 tablet 3   levothyroxine (SYNTHROID) 137 MCG tablet Take 137 mcg by mouth daily before breakfast.     Magnesium 400 MG TABS Take 400 mg by mouth every evening.     XARELTO 15 MG TABS tablet TAKE 1 TABLET(15 MG) BY MOUTH DAILY WITH SUPPER 90 tablet 1   No current facility-administered medications for this visit.    Allergies:   Ace inhibitors, Beta adrenergic blockers, and Statins    Social History:  The patient  reports that he quit smoking about 40 years ago. His smoking use included cigarettes. He has a 30.00 pack-year smoking history. He has never used smokeless tobacco. He reports that he does not currently use alcohol after a past usage of about 2.0 standard drinks of alcohol per week. He reports that he does not use drugs.   Family History:  The patient's family history includes Healthy in his daughter, sister, and son; Heart attack in his father; Other in his mother.    ROS:  Please see the history of present illness.   Otherwise, review of systems are positive for none.   All other systems are reviewed and negative.    PHYSICAL EXAM: VS:  BP 126/68   Pulse (!) 55   Ht _0  (1.727 m)   Wt 160 lb 12.8 oz (72.9 kg)   SpO2 99%   BMI 24.45 kg/m  , BMI Body mass index is 24.45 kg/m. GEN: Well nourished, well developed, in no acute distress  HEENT: normal  Neck: no JVD, carotid bruits, or masses Cardiac: RRR; gr 1/6 systolic murmur RUSB, rubs, or gallops,no edema  Respiratory:  clear to auscultation bilaterally, normal work of breathing GI: soft, nontender, nondistended, + BS MS: no deformity or atrophy. Pedal pulses are excellent. Skin: lot of bruising on arms.  Neuro:  Strength and sensation are intact Psych: euthymic mood, full affect   EKG:  EKG is not ordered today.      Lab Results  Component Value Date   WBC 6.7 05/08/2022    HGB 10.2 (L) 05/11/2022   HCT 30.0 (L) 05/11/2022   PLT 140 (L) 05/08/2022   GLUCOSE 86 05/08/2022   CHOL 149 05/08/2022   TRIG 56 05/08/2022   HDL 55 05/08/2022   LDLCALC 82 05/08/2022   ALT 12 05/08/2022   AST 18 05/08/2022   NA 142 05/11/2022   K 3.8 05/11/2022  CL 104 05/08/2022   CREATININE 1.18 05/08/2022   BUN 34 (H) 05/08/2022   CO2 20 05/08/2022   TSH 0.107 (L) 05/08/2022   Dated 01/12/20: cholesterol 135, triglycerides 93, HDL 36, LDL 80.TSH normal.  Dated 11/17/20: cholesterol 152, triglycerides 84, HDL 44, LDL 91. UA, chemistries normal  Dated 01/16/21: cholesterol 127, triglycerides 74, HDL 39, LDL 73.    Wt Readings from Last 3 Encounters:  05/25/22 160 lb 12.8 oz (72.9 kg)  05/11/22 160 lb (72.6 kg)  05/08/22 160 lb 12.8 oz (72.9 kg)      Other studies Reviewed: Additional studies/ records that were reviewed today include:   Echo 10/22/16: Study Conclusions   - Left ventricle: The cavity size was normal. There was moderate   concentric hypertrophy. Systolic function was normal. The   estimated ejection fraction was in the range of 55% to 60%. Wall   motion was normal; there were no regional wall motion   abnormalities. Features are consistent with a pseudonormal left   ventricular filling pattern, with concomitant abnormal relaxation   and increased filling pressure (grade 2 diastolic dysfunction). - Aortic valve: There was mild regurgitation. Valve area (VTI):   2.17 cm^2. - Mitral valve: Calcified annulus. There was mild regurgitation. - Left atrium: The atrium was moderately dilated. - Pulmonary arteries: PA peak pressure: 32 mm Hg (S).    Echo 08/21/18: Study Conclusions   - Left ventricle: The cavity size was normal. Wall thickness was   increased in a pattern of mild LVH. Mid to apical inferior   hypokinesis. Hypokinesis of the apex. The estimated ejection   fraction was 45%. Features are consistent with a pseudonormal   left ventricular  filling pattern, with concomitant abnormal   relaxation and increased filling pressure (grade 2 diastolic   dysfunction). - Aortic valve: Trileaflet; moderately calcified leaflets. There   was mild stenosis. There was moderate eccentric regurgitation.   Mean gradient (S): 10 mm Hg. Valve area (VTI): 1.73 cm^2. - Mitral valve: Moderately calcified annulus. Mildly calcified   leaflets. There is some degree of restriction of the posterior   leaflet. There was moderate to severe regurgitation. - Left atrium: The atrium was moderately dilated. - Right ventricle: The cavity size was normal. Systolic function   was normal. - Right atrium: The atrium was moderately to severely dilated. - Tricuspid valve: There was moderate regurgitation. Peak RV-RA   gradient 39 mmHg. - Pulmonary arteries: PA peak pressure: 47 mm Hg (S). - Systemic veins: IVC measured 2.5 cm with > 50% respirophasic   variation, suggesting RA pressure 8 mmHg.   Impressions:   - Normal LV size with mild LV hypertrophy. EF 45% with mid to   apical inferior hypokinesis and hypokinesis of the apex. Moderate   diastolic dysfunction. Normal RV size and systolic function.   Biatrial enlargement. Moderate to severe mitral regurgitation   with restriction of the posterior leaflet. Mild aortic stenosis   with moderate eccentric regurgitation. Mild pulmonary   hypertension.    Cardiac cath/PCI: 08/21/18: CORONARY STENT INTERVENTION  LEFT HEART CATH AND CORONARY ANGIOGRAPHY  Conclusion     Prox RCA lesion is 95% stenosed. Ost Cx lesion is 60% stenosed. Prox Cx lesion is 60% stenosed. Prox LAD lesion is 100% stenosed. A drug-eluting stent was successfully placed. Post intervention, there is a 0% residual stenosis. There is moderate left ventricular systolic dysfunction. LV end diastolic pressure is mildly elevated. The left ventricular ejection fraction is 35-45% by visual estimate.  Marcus JOREL GRAVLIN Sr. is a 88 y.o. male       [id number] LOCATION:  FACILITY: [facility]  PHYSICIAN: Quay Burow, M.D. Jun 09, 1932     DATE OF PROCEDURE:  08/21/2018   DATE OF DISCHARGE:        CARDIAC CATHETERIZATION / PCI DES RCA       History obtained from chart review.Marcus [name] is an 41M with paroxysmal atrial fibrillation, myasthenia gravis, hyperlipidemia and thrombocytopenia here with NSTEMI.      PROCEDURE DESCRIPTION:    The patient was brought to the second floor [facility]iac cath lab in the postabsorptive state. He was premedicated with IV Versed and fentanyl. His right wristwas prepped and shaved in usual sterile fashion. Xylocaine 1% was used for local anesthesia. A 6 French sheath was inserted into the right radial artery using standard Seldinger technique. The patient received 4000units  of heparin intravenously.  A 5 [place] TIG catheter and pigtail catheters were used for selective coronary angiography and left ventriculography respectively.  Isovue dye was used for the entirety of the case.  Retrograde aorta, left ventricular and pullback pressures were recorded.  Radial cocktail was administered via the SideArm sheath.   Patient received Brilinta 180 mg p.o. followed by Angiomax bolus and infusion with a therapeutic ACT demonstrated.  Using a 6 [place] JR4 guide catheter, 0.14 pro-water guidewire and a 2 mm x 12 mm balloon predilatation was performed to the proximal dominant RCA.  I then placed a 3 mm x 60 mm long Synergy drug-eluting stent across the diseased segment and deployed at 14 to 16 atm.  I then postdilated with a 3.25 x 12 mm long noncompliant balloon up to 16 atm (3.3 mm) resulting reduction with 95% ulcerated proximal dominant RCA plaque to 0% residual.  There was distal spasm resulting in ST segment elevation which resolved with administration of intracoronary nitroglycerin.   I then attempted to cross the mid LAD CTO using a 6 [place] XB LAD 4 cm curve guide catheter, pro-water and whisper wire and  1.5 mm balloon.  I did ultimately cross the lesion but I did not think I was intraluminal and ultimately aborted the procedure.     IMPRESSION: Successful proximal RCA PCI and drug-eluting stenting using a synergy drug-eluting stent postdilated to 3.3 mm with unsuccessful attempt at crossing mid LAD CTO.  His LV function is moderately reduced in the 40% range.  His LAD CTO can be treated in a staged fashion electively.  The sheath was removed and a TR band was placed on the right wrist to achieve patent hemostasis.  The patient left the lab in stable condition.   Quay Burow. MD, [facility] 08/21/2018 5:20 PM         Recommend uninterrupted dual antiplatelet therapy with Aspirin 21m daily and Ticagrelor 927mtwice daily for a minimum of 12 months (ACS - Class I recommendation).   Echo 03/07/20: IMPRESSIONS     1. Left ventricular ejection fraction, by estimation, is 60 to 65%. The  left ventricle has normal function. The left ventricle has no regional  wall motion abnormalities. Left ventricular diastolic function could not  be evaluated.   2. Right ventricular systolic function is normal. The right ventricular  size is normal. There is mildly elevated pulmonary artery systolic  pressure.   3. Left atrial size was severely dilated.   4. Right atrial size was severely dilated.   5. The mitral valve is normal in structure. Mild mitral valve  regurgitation.  No evidence of mitral stenosis.   6. Tricuspid valve regurgitation is moderate.   7. The aortic valve is grossly normal. Aortic valve regurgitation is  mild. No aortic stenosis is present.   8. Aortic dilatation noted. There is mild dilatation of the ascending  aorta measuring 39 mm.   Echocardiogram 02/03/2021  1. Left ventricular ejection fraction, by estimation, is 60 to 65%. The  left ventricle has normal function. The left ventricle has no regional  wall motion abnormalities. There is mild left ventricular hypertrophy.  Left  ventricular diastolic function  could not be evaluated.   2. Right ventricular systolic function is normal. The right ventricular  size is normal. There is normal pulmonary artery systolic pressure. The  estimated right ventricular systolic pressure is 88.4 mmHg.   3. Left atrial size was moderately dilated.   4. Right atrial size was moderately dilated.   5. The mitral valve is degenerative. Mild mitral valve regurgitation.  Moderate mitral annular calcification.   6. The tricuspid valve is abnormal. Tricuspid valve regurgitation is  moderate.   7. The aortic valve is tricuspid. Aortic valve regurgitation is mild to  moderate. Mild aortic valve stenosis. Aortic valve area, by VTI measures  1.70 cm. Aortic valve mean gradient measures 15.2 mmHg. Aortic valve Vmax  measures 2.62 m/s.   8. Aortic dilatation noted. There is mild dilatation of the ascending  aorta, measuring 40 mm.   9. The inferior vena cava is dilated in size with >50% respiratory  variability, suggesting right atrial pressure of 8 mmHg.  Comparison(s): Changes from prior study are noted. 03/07/2020: LVEF 60-65%,  severe biatrial enlargement, moderate TR, mild AI.   SPECT 10/05/2021  Findings are consistent with ischemia. The study is intermediate risk.   No ST deviation was noted.   LV perfusion is abnormal. There is evidence of ischemia. There is no evidence of infarction. Defect 1: There is a medium defect with severe reduction in uptake present in the apical to mid anteroseptal location(s) that is reversible. Viability is present. There is abnormal wall motion in the defect area. Consistent with ischemia.   Left ventricular function is abnormal. Global function is mildly reduced. There was a single regional abnormality. Nuclear stress EF: 54 %. The left ventricular ejection fraction is mildly decreased (45-54%). End diastolic cavity size is normal.   Prior study not available for comparison.   Echo 10/11/21: IMPRESSIONS      1. Left ventricular ejection fraction, by estimation, is 55%. The left  ventricle has normal function. The left ventricle demonstrates regional  wall motion abnormalities with severe apical and apical septal  hypokinesis. There is mild left ventricular  hypertrophy. Left ventricular diastolic parameters are consistent with  Grade II diastolic dysfunction (pseudonormalization). The average left  ventricular global longitudinal strain is -16.8 %. The global longitudinal  strain is abnormal.   2. Right ventricular systolic function is normal. The right ventricular  size is mildly enlarged. There is severely elevated pulmonary artery  systolic pressure. The estimated right ventricular systolic pressure is  16.6 mmHg.   3. Left atrial size was moderately dilated.   4. Right atrial size was severely dilated.   5. The mitral valve is abnormal. Mild to moderate mitral valve  regurgitation. No evidence of mitral stenosis. Moderate mitral annular  calcification.   6. Tricuspid valve regurgitation is moderate to severe.   7. The aortic valve is tricuspid. Aortic valve regurgitation is moderate.  Mild aortic valve stenosis. Aortic valve mean  gradient measures 14.0 mmHg.   8. Aortic dilatation noted. There is mild dilatation of the ascending  aorta, measuring 40 mm.   9. The inferior vena cava is dilated in size with <50% respiratory  variability, suggesting right atrial pressure of 15 mmHg.   Comparison(s): 02/13/21 EF 60-65%. PA pressure 51mHg. Mild AS 153mg mean  PG, 2813m peak PG.   Cardiac cath 10/13/21:  CORONARY STENT INTERVENTION  LEFT HEART CATH AND CORONARY ANGIOGRAPHY   Conclusion      Prox RCA-1 lesion is 99% stenosed.   Ost Cx to Mid Cx lesion is 60% stenosed.   Mid LAD lesion is 100% stenosed.   Previously placed Prox RCA-2 stent (unknown type) is  widely patent.   A drug-eluting stent was successfully placed using a SYNERGY XD 3.0X16.   Post intervention, there is a  0% residual stenosis.   Large dominant RCA with severe proximal stenosis. The proximal stent is patent.  Successful PTCA/DES x 1 proximal RCA Moderate disease in the proximal and mid Circumflex Functional chronic total occlusion of the calcified mid LAD, unchanged from last cath in 2019. The distal LAD fills faintly from antegrade flow and from right to left collaterals.    Recommendations: Will continue ASA and Plavix tomorrow. If his Eliquis is restarted tomorrow, I think the ASA can be stopped and he can be continued on Plavix along with Eliquis. Monitor overnight given elevated BP before the procedure and bleeding risk.   Coronary Diagrams  Diagnostic Dominance: Right  Intervention       Cardiac cath 05/11/22:  RIGHT/LEFT HEART CATH AND CORONARY ANGIOGRAPHY   Conclusion      Prox LAD to Mid LAD lesion is 100% stenosed.   Ramus lesion is 85% stenosed.   Ost Cx to Mid Cx lesion is 60% stenosed.   Previously placed Ost RCA to Prox RCA stent of unknown type is  widely patent.   LV end diastolic pressure is normal.   Single vessel occlusive CAD with CTO of the mid LAD. This has excellent left to left and right to left collaterals. Patent stents in the proximal RCA. Ramus intermediate has obstructive disease but is small in caliber Normal LV filling pressures. PCWP mean of 8 mm Hg. EDP 7 mm Hg Normal right heart pressures. Mean PAP 12 mm Hg Normal cardiac output. Index 2.67.    Plan: patient does not have pulmonary HTN and volume status is normal. No new CAD to explain his symptoms. CTO of the LAD has been asymptomatic in the past. I do think his atrial flutter is contributing to his symptoms. He has failed DCCV x 2. Would consider AAD therapy but with slow HR in atrial flutter this could result in further AV block necessitating a pacemaker. Will ask EP to see for recommendations.     ASSESSMENT AND PLAN:  1.  CAD with s/p NSTEMI in November 2019 secondary to ruptured plaque  lesion in the RCA. S/p DES. He has CTO of the mid LAD but this has always been asymptomatic . S/p stenting of the proximal RCA in Jan 2023 proximal to prior stent. Modest 60% disease in the LCx. CTO of the LAD unchanged.  Intolerant of Brilinta in the past. On Plavix and anticoagulation with Xarelto. He does have persistent DOE. Recent cardiac cath showed normal LV and Right heart pressures. Patent stents in the RCA. I suspect his symptoms are predominantly related to his persistent atrial flutter.  2. Hyperlipidemia. History of intolerance to statins.  Recent LDL 82. Really not interested in taking lipid lowering therapy.  3. Chronic diastolic CHF.exacerbated by Atrial flutter. On lasix. Hemodynamics at cath show normal filling pressures and no Pulmonary HTN.   4. HTN. On amlodipine 10 mg daily. Well controlled.   5. Myastenia gravis in remission  6. Hypothyroidism. Recent TSH low. Dr Forde Dandy has reduced his thyroid dose.   7. Atrial flutter - recurrent. Patient has had several cardioversions now with most recent in February with early recurrence. Rate is slow on no AV nodal blocking agents. Looking at his history it is clear that he is quite symptomatic in Atrial flutter and I suspect his current symptoms are largely related to this.  On  Xarelto 15 mg daily. No ASA or NSAIDs. We again discussed options for treating atrial flutter including AAD therapy or ablation. He is reluctant to take AAD therapy. May be willing to consider ablation. Has appointment on Sept 18 with Dr [name]. I am also concerned with his slow Aflutter rate he may need a pacemaker.   8. Valvular heart disease. Mild to moderate MR, moderate AI, mild AS and mod to severe TR noted on prior Echo. Again hemodynamics look great.    Current medicines are reviewed at length with the patient today.  The patient does not have concerns regarding medicines.  The following changes have been made:  no change  Follow up 4  months  Signed, Kavaughn Faucett Martinique, MD  05/25/2022 10:41 AM    [facility] [address] [place], [place], [address] Phone [phone], Fax [phone]

## 2022-05-21 NOTE — Telephone Encounter (Signed)
Sent information to patient  per Dr Martinique    His recent heart cath demonstrated that his SOB is not due to a valve problem, coronary blockage or pulmonary HTN. He was not volume overloaded at cath. As I discussed with him treating his atrial flutter is the best way to getting him feeling better. If he is willing we can go ahead and start amiodarone 400 mg daily. We could consider repeat DCCV  ( cardioversion) once he has been on therapy for 3- 4 weeks.   Peter Martinique MD, [facility]   If  you  willing to start medication  Amiodarone  400 mg ( it will be 2 tablets of 200 mg daily) we will be able to set you up for a cardioversion in 3 - to 4 weeks as Dr Martinique mentions above.  Ivin Booty RN

## 2022-05-21 NOTE — Telephone Encounter (Signed)
Forward to Dr Martinique

## 2022-05-25 ENCOUNTER — Encounter: Payer: Self-pay | Admitting: Cardiology

## 2022-05-25 ENCOUNTER — Ambulatory Visit (INDEPENDENT_AMBULATORY_CARE_PROVIDER_SITE_OTHER): Payer: Medicare Other | Admitting: Cardiology

## 2022-05-25 VITALS — BP 126/68 | HR 55 | Ht 68.0 in | Wt 160.8 lb

## 2022-05-25 DIAGNOSIS — I484 Atypical atrial flutter: Secondary | ICD-10-CM

## 2022-05-25 DIAGNOSIS — R0609 Other forms of dyspnea: Secondary | ICD-10-CM

## 2022-05-25 DIAGNOSIS — I2510 Atherosclerotic heart disease of native coronary artery without angina pectoris: Secondary | ICD-10-CM | POA: Diagnosis not present

## 2022-05-25 DIAGNOSIS — E7800 Pure hypercholesterolemia, unspecified: Secondary | ICD-10-CM

## 2022-05-30 ENCOUNTER — Ambulatory Visit: Payer: Medicare Other | Admitting: Family Medicine

## 2022-06-10 ENCOUNTER — Emergency Department (HOSPITAL_COMMUNITY): Payer: No Typology Code available for payment source

## 2022-06-10 ENCOUNTER — Encounter (HOSPITAL_COMMUNITY): Payer: Self-pay

## 2022-06-10 ENCOUNTER — Observation Stay (HOSPITAL_COMMUNITY): Payer: No Typology Code available for payment source

## 2022-06-10 ENCOUNTER — Inpatient Hospital Stay (HOSPITAL_COMMUNITY)
Admission: EM | Admit: 2022-06-10 | Discharge: 2022-06-15 | DRG: 083 | Disposition: A | Discharge status: Home Health | Payer: No Typology Code available for payment source | Attending: Neurosurgery | Admitting: Neurosurgery

## 2022-06-10 ENCOUNTER — Other Ambulatory Visit: Payer: Self-pay

## 2022-06-10 DIAGNOSIS — I252 Old myocardial infarction: Secondary | ICD-10-CM

## 2022-06-10 DIAGNOSIS — N1832 Chronic kidney disease, stage 3b: Secondary | ICD-10-CM | POA: Diagnosis present

## 2022-06-10 DIAGNOSIS — I608 Other nontraumatic subarachnoid hemorrhage: Secondary | ICD-10-CM | POA: Diagnosis not present

## 2022-06-10 DIAGNOSIS — D649 Anemia, unspecified: Secondary | ICD-10-CM | POA: Diagnosis not present

## 2022-06-10 DIAGNOSIS — Z888 Allergy status to other drugs, medicaments and biological substances status: Secondary | ICD-10-CM

## 2022-06-10 DIAGNOSIS — Z95820 Peripheral vascular angioplasty status with implants and grafts: Secondary | ICD-10-CM | POA: Diagnosis not present

## 2022-06-10 DIAGNOSIS — I10 Essential (primary) hypertension: Secondary | ICD-10-CM | POA: Diagnosis not present

## 2022-06-10 DIAGNOSIS — J439 Emphysema, unspecified: Secondary | ICD-10-CM | POA: Diagnosis present

## 2022-06-10 DIAGNOSIS — I480 Paroxysmal atrial fibrillation: Secondary | ICD-10-CM | POA: Diagnosis present

## 2022-06-10 DIAGNOSIS — I4892 Unspecified atrial flutter: Secondary | ICD-10-CM | POA: Diagnosis present

## 2022-06-10 DIAGNOSIS — Z20822 Contact with and (suspected) exposure to covid-19: Secondary | ICD-10-CM | POA: Diagnosis present

## 2022-06-10 DIAGNOSIS — N1831 Chronic kidney disease, stage 3a: Secondary | ICD-10-CM | POA: Diagnosis not present

## 2022-06-10 DIAGNOSIS — S2242XA Multiple fractures of ribs, left side, initial encounter for closed fracture: Secondary | ICD-10-CM | POA: Diagnosis present

## 2022-06-10 DIAGNOSIS — R791 Abnormal coagulation profile: Secondary | ICD-10-CM | POA: Diagnosis present

## 2022-06-10 DIAGNOSIS — S066XAA Traumatic subarachnoid hemorrhage with loss of consciousness status unknown, initial encounter: Principal | ICD-10-CM | POA: Diagnosis present

## 2022-06-10 DIAGNOSIS — G319 Degenerative disease of nervous system, unspecified: Secondary | ICD-10-CM | POA: Diagnosis not present

## 2022-06-10 DIAGNOSIS — G7000 Myasthenia gravis without (acute) exacerbation: Secondary | ICD-10-CM | POA: Diagnosis not present

## 2022-06-10 DIAGNOSIS — Z79899 Other long term (current) drug therapy: Secondary | ICD-10-CM

## 2022-06-10 DIAGNOSIS — N400 Enlarged prostate without lower urinary tract symptoms: Secondary | ICD-10-CM | POA: Diagnosis present

## 2022-06-10 DIAGNOSIS — I4510 Unspecified right bundle-branch block: Secondary | ICD-10-CM | POA: Diagnosis present

## 2022-06-10 DIAGNOSIS — E1122 Type 2 diabetes mellitus with diabetic chronic kidney disease: Secondary | ICD-10-CM | POA: Diagnosis present

## 2022-06-10 DIAGNOSIS — S066X0A Traumatic subarachnoid hemorrhage without loss of consciousness, initial encounter: Secondary | ICD-10-CM | POA: Diagnosis not present

## 2022-06-10 DIAGNOSIS — Z8249 Family history of ischemic heart disease and other diseases of the circulatory system: Secondary | ICD-10-CM

## 2022-06-10 DIAGNOSIS — Z89422 Acquired absence of other left toe(s): Secondary | ICD-10-CM | POA: Diagnosis not present

## 2022-06-10 DIAGNOSIS — Z23 Encounter for immunization: Secondary | ICD-10-CM | POA: Diagnosis not present

## 2022-06-10 DIAGNOSIS — I482 Chronic atrial fibrillation, unspecified: Secondary | ICD-10-CM | POA: Diagnosis present

## 2022-06-10 DIAGNOSIS — E032 Hypothyroidism due to medicaments and other exogenous substances: Secondary | ICD-10-CM | POA: Diagnosis not present

## 2022-06-10 DIAGNOSIS — I130 Hypertensive heart and chronic kidney disease with heart failure and stage 1 through stage 4 chronic kidney disease, or unspecified chronic kidney disease: Secondary | ICD-10-CM | POA: Diagnosis present

## 2022-06-10 DIAGNOSIS — H9190 Unspecified hearing loss, unspecified ear: Secondary | ICD-10-CM | POA: Diagnosis present

## 2022-06-10 DIAGNOSIS — D696 Thrombocytopenia, unspecified: Secondary | ICD-10-CM | POA: Diagnosis present

## 2022-06-10 DIAGNOSIS — Z7901 Long term (current) use of anticoagulants: Secondary | ICD-10-CM

## 2022-06-10 DIAGNOSIS — Z7902 Long term (current) use of antithrombotics/antiplatelets: Secondary | ICD-10-CM

## 2022-06-10 DIAGNOSIS — I5042 Chronic combined systolic (congestive) and diastolic (congestive) heart failure: Secondary | ICD-10-CM | POA: Diagnosis not present

## 2022-06-10 DIAGNOSIS — Z955 Presence of coronary angioplasty implant and graft: Secondary | ICD-10-CM

## 2022-06-10 DIAGNOSIS — Z041 Encounter for examination and observation following transport accident: Secondary | ICD-10-CM | POA: Diagnosis not present

## 2022-06-10 DIAGNOSIS — I629 Nontraumatic intracranial hemorrhage, unspecified: Secondary | ICD-10-CM

## 2022-06-10 DIAGNOSIS — I4891 Unspecified atrial fibrillation: Secondary | ICD-10-CM | POA: Diagnosis not present

## 2022-06-10 DIAGNOSIS — S61412A Laceration without foreign body of left hand, initial encounter: Secondary | ICD-10-CM | POA: Diagnosis present

## 2022-06-10 DIAGNOSIS — R109 Unspecified abdominal pain: Secondary | ICD-10-CM | POA: Diagnosis not present

## 2022-06-10 DIAGNOSIS — I6523 Occlusion and stenosis of bilateral carotid arteries: Secondary | ICD-10-CM | POA: Diagnosis not present

## 2022-06-10 DIAGNOSIS — E039 Hypothyroidism, unspecified: Secondary | ICD-10-CM | POA: Diagnosis present

## 2022-06-10 DIAGNOSIS — I609 Nontraumatic subarachnoid hemorrhage, unspecified: Secondary | ICD-10-CM

## 2022-06-10 DIAGNOSIS — M7989 Other specified soft tissue disorders: Secondary | ICD-10-CM | POA: Diagnosis not present

## 2022-06-10 DIAGNOSIS — T1490XA Injury, unspecified, initial encounter: Secondary | ICD-10-CM | POA: Diagnosis not present

## 2022-06-10 DIAGNOSIS — Y92410 Unspecified street and highway as the place of occurrence of the external cause: Secondary | ICD-10-CM | POA: Diagnosis not present

## 2022-06-10 DIAGNOSIS — I2510 Atherosclerotic heart disease of native coronary artery without angina pectoris: Secondary | ICD-10-CM | POA: Diagnosis present

## 2022-06-10 DIAGNOSIS — Z79890 Hormone replacement therapy (postmenopausal): Secondary | ICD-10-CM

## 2022-06-10 DIAGNOSIS — S0003XA Contusion of scalp, initial encounter: Secondary | ICD-10-CM | POA: Diagnosis not present

## 2022-06-10 DIAGNOSIS — Z87891 Personal history of nicotine dependence: Secondary | ICD-10-CM

## 2022-06-10 DIAGNOSIS — S066X0S Traumatic subarachnoid hemorrhage without loss of consciousness, sequela: Secondary | ICD-10-CM | POA: Diagnosis not present

## 2022-06-10 DIAGNOSIS — S2249XA Multiple fractures of ribs, unspecified side, initial encounter for closed fracture: Secondary | ICD-10-CM

## 2022-06-10 DIAGNOSIS — E7800 Pure hypercholesterolemia, unspecified: Secondary | ICD-10-CM | POA: Diagnosis not present

## 2022-06-10 DIAGNOSIS — I517 Cardiomegaly: Secondary | ICD-10-CM | POA: Diagnosis not present

## 2022-06-10 DIAGNOSIS — S80212A Abrasion, left knee, initial encounter: Secondary | ICD-10-CM | POA: Diagnosis not present

## 2022-06-10 DIAGNOSIS — E669 Obesity, unspecified: Secondary | ICD-10-CM | POA: Diagnosis present

## 2022-06-10 DIAGNOSIS — Z8679 Personal history of other diseases of the circulatory system: Secondary | ICD-10-CM | POA: Diagnosis present

## 2022-06-10 HISTORY — DX: Non-ST elevation (NSTEMI) myocardial infarction: I21.4

## 2022-06-10 LAB — COMPREHENSIVE METABOLIC PANEL
ALT: 23 U/L (ref 0–44)
AST: 33 U/L (ref 15–41)
Albumin: 3.9 g/dL (ref 3.5–5.0)
Alkaline Phosphatase: 79 U/L (ref 38–126)
Anion gap: 13 (ref 5–15)
BUN: 38 mg/dL — ABNORMAL HIGH (ref 8–23)
CO2: 14 mmol/L — ABNORMAL LOW (ref 22–32)
Calcium: 8.9 mg/dL (ref 8.9–10.3)
Chloride: 109 mmol/L (ref 98–111)
Creatinine, Ser: 1.47 mg/dL — ABNORMAL HIGH (ref 0.61–1.24)
GFR, Estimated: 46 mL/min — ABNORMAL LOW (ref >60)
Glucose, Bld: 141 mg/dL — ABNORMAL HIGH (ref 70–99)
Potassium: 4 mmol/L (ref 3.5–5.1)
Sodium: 136 mmol/L (ref 135–145)
Total Bilirubin: 0.6 mg/dL (ref 0.3–1.2)
Total Protein: 6.9 g/dL (ref 6.5–8.1)

## 2022-06-10 LAB — URINALYSIS, ROUTINE W REFLEX MICROSCOPIC
Bacteria, UA: NONE SEEN
Bilirubin Urine: NEGATIVE
Glucose, UA: NEGATIVE mg/dL
Ketones, ur: 5 mg/dL — AB
Nitrite: NEGATIVE
Protein, ur: NEGATIVE mg/dL
Specific Gravity, Urine: >1.046 — ABNORMAL HIGH (ref 1.005–1.030)
pH: 5 (ref 5.0–8.0)

## 2022-06-10 LAB — I-STAT CHEM 8, ED
BUN: 35 mg/dL — ABNORMAL HIGH (ref 8–23)
Calcium, Ion: 1.07 mmol/L — ABNORMAL LOW (ref 1.15–1.40)
Chloride: 110 mmol/L (ref 98–111)
Creatinine, Ser: 1.3 mg/dL — ABNORMAL HIGH (ref 0.61–1.24)
Glucose, Bld: 131 mg/dL — ABNORMAL HIGH (ref 70–99)
HCT: 36 % — ABNORMAL LOW (ref 39.0–52.0)
Hemoglobin: 12.2 g/dL — ABNORMAL LOW (ref 13.0–17.0)
Potassium: 3.8 mmol/L (ref 3.5–5.1)
Sodium: 138 mmol/L (ref 135–145)
TCO2: 18 mmol/L — ABNORMAL LOW (ref 22–32)

## 2022-06-10 LAB — SAMPLE TO BLOOD BANK

## 2022-06-10 LAB — CBC
HCT: 35.9 % — ABNORMAL LOW (ref 39.0–52.0)
Hemoglobin: 11.9 g/dL — ABNORMAL LOW (ref 13.0–17.0)
MCH: 32.5 pg (ref 26.0–34.0)
MCHC: 33.1 g/dL (ref 30.0–36.0)
MCV: 98.1 fL (ref 80.0–100.0)
Platelets: 155 10*3/uL (ref 150–400)
RBC: 3.66 MIL/uL — ABNORMAL LOW (ref 4.22–5.81)
RDW: 14.2 % (ref 11.5–15.5)
WBC: 18.6 10*3/uL — ABNORMAL HIGH (ref 4.0–10.5)
nRBC: 0 % (ref 0.0–0.2)

## 2022-06-10 LAB — ETHANOL: Alcohol, Ethyl (B): <10 mg/dL (ref <10)

## 2022-06-10 LAB — LACTIC ACID, PLASMA: Lactic Acid, Venous: 3 mmol/L (ref 0.5–1.9)

## 2022-06-10 LAB — RESP PANEL BY RT-PCR (FLU A&B, COVID) ARPGX2
Influenza A by PCR: NEGATIVE
Influenza B by PCR: NEGATIVE
SARS Coronavirus 2 by RT PCR: NEGATIVE

## 2022-06-10 LAB — PROTIME-INR
INR: 3.1 — ABNORMAL HIGH (ref 0.8–1.2)
Prothrombin Time: 31.4 seconds — ABNORMAL HIGH (ref 11.4–15.2)

## 2022-06-10 MED ORDER — ACETAMINOPHEN 325 MG PO TABS
650.0000 mg | ORAL_TABLET | Freq: Four times a day (QID) | ORAL | Status: DC | PRN
Start: 2022-06-10 — End: 2022-06-11

## 2022-06-10 MED ORDER — HYDROMORPHONE HCL 1 MG/ML IJ SOLN
0.5000 mg | INTRAMUSCULAR | Status: DC | PRN
  Administered 2022-06-10: 0.5 mg via INTRAVENOUS
  Filled 2022-06-10: qty 1
  Filled 2022-06-10: qty 0.5

## 2022-06-10 MED ORDER — IOHEXOL 350 MG/ML SOLN
100.0000 mL | Freq: Once | INTRAVENOUS | Status: AC | PRN
  Administered 2022-06-10: 100 mL via INTRAVENOUS

## 2022-06-10 MED ORDER — TETANUS-DIPHTH-ACELL PERTUSSIS 5-2.5-18.5 LF-MCG/0.5 IM SUSY
0.5000 mL | PREFILLED_SYRINGE | Freq: Once | INTRAMUSCULAR | Status: AC
  Administered 2022-06-10: 0.5 mL via INTRAMUSCULAR
  Filled 2022-06-10: qty 0.5

## 2022-06-10 MED ORDER — FUROSEMIDE 20 MG PO TABS
20.0000 mg | ORAL_TABLET | Freq: Every day | ORAL | Status: DC
  Administered 2022-06-11 – 2022-06-12 (×2): 20 mg via ORAL
  Filled 2022-06-10 (×4): qty 1

## 2022-06-10 MED ORDER — AMLODIPINE BESYLATE 10 MG PO TABS
10.0000 mg | ORAL_TABLET | Freq: Every day | ORAL | Status: DC
  Administered 2022-06-11 – 2022-06-15 (×5): 10 mg via ORAL
  Filled 2022-06-10 (×6): qty 1

## 2022-06-10 MED ORDER — HYDROCODONE-ACETAMINOPHEN 7.5-325 MG PO TABS
1.0000 | ORAL_TABLET | ORAL | Status: DC | PRN
  Administered 2022-06-10: 1 via ORAL
  Filled 2022-06-10: qty 1

## 2022-06-10 MED ORDER — ACETAMINOPHEN 650 MG RE SUPP: 650.0000 mg | Freq: Four times a day (QID) | RECTAL | Status: DC | PRN

## 2022-06-10 MED ORDER — POLYETHYLENE GLYCOL 3350 17 G PO PACK: 17.0000 g | PACK | Freq: Every day | ORAL | Status: DC | PRN

## 2022-06-10 MED ORDER — LEVOTHYROXINE SODIUM 112 MCG PO TABS
137.0000 ug | ORAL_TABLET | Freq: Every day | ORAL | Status: DC
  Administered 2022-06-11 – 2022-06-15 (×5): 137 ug via ORAL
  Filled 2022-06-10 (×5): qty 1

## 2022-06-10 MED ORDER — EMPTY CONTAINERS FLEXIBLE MISC
1800.0000 mg | Freq: Once | Status: AC
  Administered 2022-06-10: 1800 mg via INTRAVENOUS
  Filled 2022-06-10: qty 180

## 2022-06-10 MED ORDER — SODIUM CHLORIDE 0.9% FLUSH
3.0000 mL | Freq: Two times a day (BID) | INTRAVENOUS | Status: DC
  Administered 2022-06-10 – 2022-06-15 (×10): 3 mL via INTRAVENOUS
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
## 2022-06-10 NOTE — ED Notes (Signed)
Admitting at bedside 

## 2022-06-10 NOTE — ED Notes (Addendum)
Skin tear to L forearm and L Hand. Wounds cleaned and dressed by MD. Abrasions to L knee and R lower leg.

## 2022-06-10 NOTE — ED Notes (Signed)
C-collar placed.

## 2022-06-10 NOTE — ED Notes (Signed)
Patient transported to CT with RN 
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
## 2022-06-10 NOTE — Progress Notes (Signed)
Orthopedic Tech Progress Note Patient Details:  Marcus ISADORE Sr. Feb 28, 1932 [id number]  Patient ID: Marcus Kaufmann Sr., male   DOB: 1932/08/02, 88 y.o.   MRN: [id number] Level II, not currently needed. Vernona Rieger 06/10/2022, 5:26 PM
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
## 2022-06-11 DIAGNOSIS — I5042 Chronic combined systolic (congestive) and diastolic (congestive) heart failure: Secondary | ICD-10-CM | POA: Diagnosis not present

## 2022-06-11 DIAGNOSIS — E032 Hypothyroidism due to medicaments and other exogenous substances: Secondary | ICD-10-CM | POA: Diagnosis not present

## 2022-06-11 DIAGNOSIS — E7800 Pure hypercholesterolemia, unspecified: Secondary | ICD-10-CM | POA: Diagnosis not present

## 2022-06-11 DIAGNOSIS — S2242XA Multiple fractures of ribs, left side, initial encounter for closed fracture: Secondary | ICD-10-CM | POA: Diagnosis not present

## 2022-06-11 DIAGNOSIS — S066X0A Traumatic subarachnoid hemorrhage without loss of consciousness, initial encounter: Secondary | ICD-10-CM | POA: Diagnosis not present

## 2022-06-11 DIAGNOSIS — S066XAA Traumatic subarachnoid hemorrhage with loss of consciousness status unknown, initial encounter: Secondary | ICD-10-CM | POA: Diagnosis not present

## 2022-06-11 DIAGNOSIS — I2510 Atherosclerotic heart disease of native coronary artery without angina pectoris: Secondary | ICD-10-CM | POA: Diagnosis not present

## 2022-06-11 DIAGNOSIS — N1831 Chronic kidney disease, stage 3a: Secondary | ICD-10-CM | POA: Diagnosis not present

## 2022-06-11 DIAGNOSIS — G7000 Myasthenia gravis without (acute) exacerbation: Secondary | ICD-10-CM

## 2022-06-11 DIAGNOSIS — D649 Anemia, unspecified: Secondary | ICD-10-CM | POA: Diagnosis not present

## 2022-06-11 DIAGNOSIS — E669 Obesity, unspecified: Secondary | ICD-10-CM

## 2022-06-11 DIAGNOSIS — I482 Chronic atrial fibrillation, unspecified: Secondary | ICD-10-CM | POA: Diagnosis not present

## 2022-06-11 DIAGNOSIS — I10 Essential (primary) hypertension: Secondary | ICD-10-CM | POA: Diagnosis not present

## 2022-06-11 DIAGNOSIS — I480 Paroxysmal atrial fibrillation: Secondary | ICD-10-CM

## 2022-06-11 LAB — BASIC METABOLIC PANEL
Anion gap: 7 (ref 5–15)
BUN: 36 mg/dL — ABNORMAL HIGH (ref 8–23)
CO2: 20 mmol/L — ABNORMAL LOW (ref 22–32)
Calcium: 8.6 mg/dL — ABNORMAL LOW (ref 8.9–10.3)
Chloride: 108 mmol/L (ref 98–111)
Creatinine, Ser: 1.35 mg/dL — ABNORMAL HIGH (ref 0.61–1.24)
GFR, Estimated: 51 mL/min — ABNORMAL LOW (ref >60)
Glucose, Bld: 105 mg/dL — ABNORMAL HIGH (ref 70–99)
Potassium: 4.2 mmol/L (ref 3.5–5.1)
Sodium: 135 mmol/L (ref 135–145)

## 2022-06-11 LAB — CBC
HCT: 31 % — ABNORMAL LOW (ref 39.0–52.0)
Hemoglobin: 10.1 g/dL — ABNORMAL LOW (ref 13.0–17.0)
MCH: 31.5 pg (ref 26.0–34.0)
MCHC: 32.6 g/dL (ref 30.0–36.0)
MCV: 96.6 fL (ref 80.0–100.0)
Platelets: 115 10*3/uL — ABNORMAL LOW (ref 150–400)
RBC: 3.21 MIL/uL — ABNORMAL LOW (ref 4.22–5.81)
RDW: 14.3 % (ref 11.5–15.5)
WBC: 9.6 10*3/uL (ref 4.0–10.5)
nRBC: 0 % (ref 0.0–0.2)

## 2022-06-11 MED ORDER — LEVETIRACETAM 500 MG PO TABS
500.0000 mg | ORAL_TABLET | Freq: Two times a day (BID) | ORAL | Status: DC
  Filled 2022-06-11: qty 1

## 2022-06-11 MED ORDER — METHOCARBAMOL 500 MG PO TABS
500.0000 mg | ORAL_TABLET | Freq: Four times a day (QID) | ORAL | Status: DC | PRN
  Administered 2022-06-11 – 2022-06-12 (×3): 500 mg via ORAL
  Filled 2022-06-11 (×3): qty 1

## 2022-06-11 MED ORDER — FENTANYL CITRATE PF 50 MCG/ML IJ SOSY
12.5000 ug | PREFILLED_SYRINGE | INTRAMUSCULAR | Status: DC | PRN
  Administered 2022-06-11 – 2022-06-12 (×6): 12.5 ug via INTRAVENOUS
  Filled 2022-06-11 (×6): qty 1

## 2022-06-11 MED ORDER — DOCUSATE SODIUM 100 MG PO CAPS
100.0000 mg | ORAL_CAPSULE | Freq: Two times a day (BID) | ORAL | Status: DC
  Administered 2022-06-11 – 2022-06-15 (×8): 100 mg via ORAL
  Filled 2022-06-11 (×9): qty 1

## 2022-06-11 MED ORDER — ACETAMINOPHEN 325 MG PO TABS
650.0000 mg | ORAL_TABLET | Freq: Four times a day (QID) | ORAL | Status: DC
  Administered 2022-06-11 – 2022-06-12 (×7): 650 mg via ORAL
  Filled 2022-06-11 (×8): qty 2

## 2022-06-11 MED ORDER — LIDOCAINE 5 % EX PTCH
1.0000 | MEDICATED_PATCH | CUTANEOUS | Status: DC
  Administered 2022-06-11 – 2022-06-15 (×5): 1 via TRANSDERMAL
  Filled 2022-06-11 (×6): qty 1

## 2022-06-11 MED ORDER — LEVETIRACETAM 500 MG PO TABS
500.0000 mg | ORAL_TABLET | Freq: Two times a day (BID) | ORAL | Status: DC
  Administered 2022-06-11 – 2022-06-15 (×8): 500 mg via ORAL
  Filled 2022-06-11 (×10): qty 1

## 2022-06-11 MED ORDER — ACETAMINOPHEN 650 MG RE SUPP: 650.0000 mg | Freq: Four times a day (QID) | RECTAL | Status: DC

## 2022-06-11 NOTE — TOC CAGE-AID Note (Signed)
Transition of Care (TOC) - CAGE-AID Screening   Patient Details  Name: Marcus BOTTGER Sr. MRN: [id number] Date of Birth: 25-Dec-1931   Dia Crawford, RN Phone Number: 06/11/2022, 2:41 AM   Clinical Narrative:  No current etoh, drug or tobacco usage, no resources needed.   CAGE-AID Screening:    Have You Ever Felt You Ought to Cut Down on Your Drinking or Drug Use?: No Have People Annoyed You By Critizing Your Drinking Or Drug Use?: No Have You Felt Bad Or Guilty About Your Drinking Or Drug Use?: No Have You Ever Had a Drink or Used Drugs First Thing In The Morning to Steady Your Nerves or to Get Rid of a Hangover?: No CAGE-AID Score: 0  Substance Abuse Education Offered: No
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
## 2022-06-11 NOTE — Progress Notes (Signed)
Notified Irene Pap NP of latest CT result.
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
## 2022-06-11 NOTE — Plan of Care (Signed)
  Problem: Clinical Measurements: Goal: Diagnostic test results will improve Outcome: Not Progressing   Problem: Clinical Measurements: Goal: Cardiovascular complication will be avoided Outcome: Not Progressing   Problem: Pain Managment: Goal: General experience of comfort will improve Outcome: Not Progressing   Problem: Safety: Goal: Ability to remain free from injury will improve Outcome: Not Progressing   Problem: Skin Integrity: Goal: Risk for impaired skin integrity will decrease Outcome: Not Progressing

## 2022-06-12 ENCOUNTER — Observation Stay (HOSPITAL_COMMUNITY): Payer: No Typology Code available for payment source

## 2022-06-12 ENCOUNTER — Telehealth: Payer: Self-pay | Admitting: Cardiology

## 2022-06-12 DIAGNOSIS — E039 Hypothyroidism, unspecified: Secondary | ICD-10-CM | POA: Diagnosis present

## 2022-06-12 DIAGNOSIS — E7800 Pure hypercholesterolemia, unspecified: Secondary | ICD-10-CM | POA: Diagnosis present

## 2022-06-12 DIAGNOSIS — I4892 Unspecified atrial flutter: Secondary | ICD-10-CM | POA: Diagnosis present

## 2022-06-12 DIAGNOSIS — N1831 Chronic kidney disease, stage 3a: Secondary | ICD-10-CM | POA: Diagnosis not present

## 2022-06-12 DIAGNOSIS — I4510 Unspecified right bundle-branch block: Secondary | ICD-10-CM | POA: Diagnosis present

## 2022-06-12 DIAGNOSIS — I130 Hypertensive heart and chronic kidney disease with heart failure and stage 1 through stage 4 chronic kidney disease, or unspecified chronic kidney disease: Secondary | ICD-10-CM | POA: Diagnosis present

## 2022-06-12 DIAGNOSIS — G7000 Myasthenia gravis without (acute) exacerbation: Secondary | ICD-10-CM

## 2022-06-12 DIAGNOSIS — Z95820 Peripheral vascular angioplasty status with implants and grafts: Secondary | ICD-10-CM

## 2022-06-12 DIAGNOSIS — S2242XA Multiple fractures of ribs, left side, initial encounter for closed fracture: Secondary | ICD-10-CM | POA: Diagnosis present

## 2022-06-12 DIAGNOSIS — I480 Paroxysmal atrial fibrillation: Secondary | ICD-10-CM

## 2022-06-12 DIAGNOSIS — J439 Emphysema, unspecified: Secondary | ICD-10-CM | POA: Diagnosis present

## 2022-06-12 DIAGNOSIS — Z20822 Contact with and (suspected) exposure to covid-19: Secondary | ICD-10-CM | POA: Diagnosis present

## 2022-06-12 DIAGNOSIS — S0003XA Contusion of scalp, initial encounter: Secondary | ICD-10-CM | POA: Diagnosis not present

## 2022-06-12 DIAGNOSIS — E1122 Type 2 diabetes mellitus with diabetic chronic kidney disease: Secondary | ICD-10-CM | POA: Diagnosis present

## 2022-06-12 DIAGNOSIS — Z89422 Acquired absence of other left toe(s): Secondary | ICD-10-CM | POA: Diagnosis not present

## 2022-06-12 DIAGNOSIS — R791 Abnormal coagulation profile: Secondary | ICD-10-CM | POA: Diagnosis present

## 2022-06-12 DIAGNOSIS — H9190 Unspecified hearing loss, unspecified ear: Secondary | ICD-10-CM | POA: Diagnosis present

## 2022-06-12 DIAGNOSIS — I2510 Atherosclerotic heart disease of native coronary artery without angina pectoris: Secondary | ICD-10-CM

## 2022-06-12 DIAGNOSIS — S61412A Laceration without foreign body of left hand, initial encounter: Secondary | ICD-10-CM | POA: Diagnosis present

## 2022-06-12 DIAGNOSIS — S066X0S Traumatic subarachnoid hemorrhage without loss of consciousness, sequela: Secondary | ICD-10-CM | POA: Diagnosis not present

## 2022-06-12 DIAGNOSIS — D696 Thrombocytopenia, unspecified: Secondary | ICD-10-CM | POA: Diagnosis present

## 2022-06-12 DIAGNOSIS — I608 Other nontraumatic subarachnoid hemorrhage: Secondary | ICD-10-CM | POA: Diagnosis not present

## 2022-06-12 DIAGNOSIS — S066XAA Traumatic subarachnoid hemorrhage with loss of consciousness status unknown, initial encounter: Secondary | ICD-10-CM | POA: Diagnosis present

## 2022-06-12 DIAGNOSIS — E032 Hypothyroidism due to medicaments and other exogenous substances: Secondary | ICD-10-CM | POA: Diagnosis not present

## 2022-06-12 DIAGNOSIS — D649 Anemia, unspecified: Secondary | ICD-10-CM

## 2022-06-12 DIAGNOSIS — S066X0A Traumatic subarachnoid hemorrhage without loss of consciousness, initial encounter: Secondary | ICD-10-CM

## 2022-06-12 DIAGNOSIS — I5042 Chronic combined systolic (congestive) and diastolic (congestive) heart failure: Secondary | ICD-10-CM | POA: Diagnosis present

## 2022-06-12 DIAGNOSIS — I10 Essential (primary) hypertension: Secondary | ICD-10-CM | POA: Diagnosis not present

## 2022-06-12 DIAGNOSIS — N400 Enlarged prostate without lower urinary tract symptoms: Secondary | ICD-10-CM | POA: Diagnosis present

## 2022-06-12 DIAGNOSIS — Y92410 Unspecified street and highway as the place of occurrence of the external cause: Secondary | ICD-10-CM | POA: Diagnosis not present

## 2022-06-12 DIAGNOSIS — I482 Chronic atrial fibrillation, unspecified: Secondary | ICD-10-CM | POA: Diagnosis present

## 2022-06-12 DIAGNOSIS — I609 Nontraumatic subarachnoid hemorrhage, unspecified: Secondary | ICD-10-CM | POA: Diagnosis present

## 2022-06-12 DIAGNOSIS — Z23 Encounter for immunization: Secondary | ICD-10-CM | POA: Diagnosis not present

## 2022-06-12 DIAGNOSIS — I629 Nontraumatic intracranial hemorrhage, unspecified: Secondary | ICD-10-CM

## 2022-06-12 DIAGNOSIS — N1832 Chronic kidney disease, stage 3b: Secondary | ICD-10-CM | POA: Diagnosis present

## 2022-06-12 DIAGNOSIS — E669 Obesity, unspecified: Secondary | ICD-10-CM

## 2022-06-12 LAB — BASIC METABOLIC PANEL
Anion gap: 9 (ref 5–15)
BUN: 51 mg/dL — ABNORMAL HIGH (ref 8–23)
CO2: 18 mmol/L — ABNORMAL LOW (ref 22–32)
Calcium: 8.8 mg/dL — ABNORMAL LOW (ref 8.9–10.3)
Chloride: 109 mmol/L (ref 98–111)
Creatinine, Ser: 1.85 mg/dL — ABNORMAL HIGH (ref 0.61–1.24)
GFR, Estimated: 35 mL/min — ABNORMAL LOW (ref >60)
Glucose, Bld: 108 mg/dL — ABNORMAL HIGH (ref 70–99)
Potassium: 4.1 mmol/L (ref 3.5–5.1)
Sodium: 136 mmol/L (ref 135–145)

## 2022-06-12 LAB — MAGNESIUM: Magnesium: 2.2 mg/dL (ref 1.7–2.4)

## 2022-06-12 LAB — CBC
HCT: 31.3 % — ABNORMAL LOW (ref 39.0–52.0)
Hemoglobin: 10.5 g/dL — ABNORMAL LOW (ref 13.0–17.0)
MCH: 31.7 pg (ref 26.0–34.0)
MCHC: 33.5 g/dL (ref 30.0–36.0)
MCV: 94.6 fL (ref 80.0–100.0)
Platelets: 120 10*3/uL — ABNORMAL LOW (ref 150–400)
RBC: 3.31 MIL/uL — ABNORMAL LOW (ref 4.22–5.81)
RDW: 14.3 % (ref 11.5–15.5)
WBC: 11 10*3/uL — ABNORMAL HIGH (ref 4.0–10.5)
nRBC: 0 % (ref 0.0–0.2)

## 2022-06-12 LAB — GLUCOSE, CAPILLARY: Glucose-Capillary: 88 mg/dL (ref 70–99)

## 2022-06-12 MED ORDER — IOHEXOL 350 MG/ML SOLN
100.0000 mL | Freq: Once | INTRAVENOUS | Status: AC | PRN
  Administered 2022-06-12: 50 mL via INTRAVENOUS

## 2022-06-12 MED ORDER — NIMODIPINE 30 MG PO CAPS
60.0000 mg | ORAL_CAPSULE | ORAL | Status: DC
  Administered 2022-06-12 – 2022-06-14 (×10): 60 mg via ORAL
  Filled 2022-06-12 (×10): qty 2

## 2022-06-12 MED ORDER — ORAL CARE MOUTH RINSE: 15.0000 mL | OROMUCOSAL | Status: DC | PRN

## 2022-06-12 MED ORDER — OXYCODONE HCL 5 MG PO TABS
2.5000 mg | ORAL_TABLET | ORAL | Status: DC | PRN
  Administered 2022-06-12: 5 mg via ORAL
  Administered 2022-06-12: 2.5 mg via ORAL
  Administered 2022-06-13 (×2): 5 mg via ORAL
  Administered 2022-06-13: 2.5 mg via ORAL
  Administered 2022-06-14 – 2022-06-15 (×3): 5 mg via ORAL
  Filled 2022-06-12 (×9): qty 1

## 2022-06-12 MED ORDER — FENTANYL CITRATE PF 50 MCG/ML IJ SOSY: 12.5000 ug | PREFILLED_SYRINGE | INTRAMUSCULAR | Status: DC | PRN

## 2022-06-12 MED ORDER — CLEVIDIPINE BUTYRATE 0.5 MG/ML IV EMUL
0.0000 mg/h | INTRAVENOUS | Status: DC
  Filled 2022-06-12: qty 50

## 2022-06-12 MED ORDER — STROKE: EARLY STAGES OF RECOVERY BOOK
Freq: Once | Status: AC
  Administered 2022-06-13: 1
  Filled 2022-06-12: qty 1

## 2022-06-12 MED ORDER — CHLORHEXIDINE GLUCONATE CLOTH 2 % EX PADS
6.0000 | MEDICATED_PAD | Freq: Every day | CUTANEOUS | Status: DC
  Administered 2022-06-14: 6 via TOPICAL

## 2022-06-12 MED ORDER — DIPHENHYDRAMINE HCL 25 MG PO CAPS
25.0000 mg | ORAL_CAPSULE | Freq: Every evening | ORAL | Status: DC | PRN
  Administered 2022-06-13: 25 mg via ORAL
  Filled 2022-06-12: qty 1

## 2022-06-12 MED ORDER — NIMODIPINE 6 MG/ML PO SOLN
60.0000 mg | ORAL | Status: DC
  Filled 2022-06-12 (×5): qty 10

## 2022-06-12 MED ORDER — MUPIROCIN 2 % EX OINT: 1.0000 | TOPICAL_OINTMENT | Freq: Two times a day (BID) | CUTANEOUS | Status: DC

## 2022-06-12 MED ORDER — METHOCARBAMOL 500 MG PO TABS
500.0000 mg | ORAL_TABLET | Freq: Four times a day (QID) | ORAL | Status: DC
  Administered 2022-06-12 (×2): 500 mg via ORAL
  Filled 2022-06-12 (×2): qty 1

## 2022-06-12 MED ORDER — SODIUM CHLORIDE 0.9 % IV SOLN: INTRAVENOUS | Status: DC

## 2022-06-12 NOTE — Progress Notes (Signed)
   06/12/22 2323  Vitals  BP 101/71  MAP (mmHg) 81  Pulse Rate (!) 45  ECG Heart Rate (!) 44  Resp 11  Oxygen Therapy  SpO2 94 %  MEWS Score  MEWS Temp 0  MEWS Systolic 0  MEWS Pulse 1  MEWS RR 1  MEWS LOC 0  MEWS Score 2  MEWS Score Color Yellow  Provider Notification  Provider Name/Title Pershing Cox, NP  Date Provider Notified 06/12/22  Time Provider Notified 2505  Method of Notification Page  Notification Reason Change in status (HR in the 47s when patient is asleep, not symtomatic)  Provider response No new orders  Date of Provider Response 06/12/22  Time of Provider Response 2340    Verbal from Neurosurgery that as long as SBP is less than 150, BP is okay to be in the 110s (which is different from cleviprex order 130-150)

## 2022-06-12 NOTE — Telephone Encounter (Signed)
Spoke to patient Dr.[name] is out of office this afternoon.I will send message to him.
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
## 2022-06-12 NOTE — Progress Notes (Signed)
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 multimodal pain control (meds modified as below) and pulm toilet. O2 sats stable on room air A flutter Hypothyroidism Hypertension Myasthenia gravis CAD CHF HLD CKD-3b   ID - none FEN - HH diet VTE - SCDs only until cleared by NSGY to start Foley - none   Dispo - Add scheduled robaxin and oxy 2.5-'5mg'$  PRN. Benadryl PRN sleep qhs. Continue TBI team therapies - cleared for dc with no follow up.    I reviewed hospitalist notes, last 24 h vitals and pain scores, last 48 h intake and output, last 24 h labs and trends, and last 24 h imaging results    LOS: 0 days    Wellington [place], [facility] 06/12/2022, 9:07 AM Please see Amion for pager number during day hours 7:00am-4:30pm

## 2022-06-12 NOTE — Progress Notes (Signed)
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 follw the [facility] pathway. Transfer to neruo ICU and cerebral angiogram with Dr. Kathyrn Sheriff tomorrow.     LOS: 0 days     Marvis Moeller, DNP, AGNP-C Neurosurgery Nurse  Practitioner  [facility] [place] [address] [place], [place], [place] [address] P: [phone]    F: [phone]  06/12/2022 10:18 AM   Addendum:  Patient seen and examined.  Agree with above.  Given continued slight increase in subarachnoid hemorrhage, CT angiogram was obtained which showed a left M1-M2 junction fusiform aneurysm concerning for etiology of the of his subarachnoid hemorrhage.  Patient again denies any thunderclap headache or loss of consciousness.  I discussed with the neurovascular specialist, Dr. Kathyrn Sheriff, and he recommended treatment such as subarachnoid hemorrhage with a diagnostic angiogram which will be performed at 06/13/2022.  We will work on transferring the patient to the ICU and beginning his subarachnoid hemorrhage protocol.  I discussed this with the family at bedside as well as the patient.  I answered all of their questions.  Thank you for allowing me to participate in this patient's care.  Please do not hesitate to call with  questions or concerns.   Elwin Sleight, [facility] Cell: [phone]
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
## 2022-06-13 ENCOUNTER — Other Ambulatory Visit (HOSPITAL_COMMUNITY): Payer: Self-pay | Admitting: *Deleted

## 2022-06-13 ENCOUNTER — Inpatient Hospital Stay (HOSPITAL_COMMUNITY): Payer: No Typology Code available for payment source

## 2022-06-13 ENCOUNTER — Encounter (HOSPITAL_COMMUNITY): Payer: Self-pay | Admitting: Internal Medicine

## 2022-06-13 DIAGNOSIS — I608 Other nontraumatic subarachnoid hemorrhage: Secondary | ICD-10-CM

## 2022-06-13 LAB — BASIC METABOLIC PANEL
Anion gap: 10 (ref 5–15)
BUN: 50 mg/dL — ABNORMAL HIGH (ref 8–23)
CO2: 16 mmol/L — ABNORMAL LOW (ref 22–32)
Calcium: 8.3 mg/dL — ABNORMAL LOW (ref 8.9–10.3)
Chloride: 111 mmol/L (ref 98–111)
Creatinine, Ser: 1.71 mg/dL — ABNORMAL HIGH (ref 0.61–1.24)
GFR, Estimated: 38 mL/min — ABNORMAL LOW (ref >60)
Glucose, Bld: 101 mg/dL — ABNORMAL HIGH (ref 70–99)
Potassium: 3.9 mmol/L (ref 3.5–5.1)
Sodium: 137 mmol/L (ref 135–145)

## 2022-06-13 LAB — SURGICAL PCR SCREEN
MRSA, PCR: NEGATIVE
Staphylococcus aureus: NEGATIVE

## 2022-06-13 LAB — CBC
HCT: 28.7 % — ABNORMAL LOW (ref 39.0–52.0)
Hemoglobin: 9.6 g/dL — ABNORMAL LOW (ref 13.0–17.0)
MCH: 32.1 pg (ref 26.0–34.0)
MCHC: 33.4 g/dL (ref 30.0–36.0)
MCV: 96 fL (ref 80.0–100.0)
Platelets: 113 10*3/uL — ABNORMAL LOW (ref 150–400)
RBC: 2.99 MIL/uL — ABNORMAL LOW (ref 4.22–5.81)
RDW: 14.3 % (ref 11.5–15.5)
WBC: 8.9 10*3/uL (ref 4.0–10.5)
nRBC: 0 % (ref 0.0–0.2)

## 2022-06-13 LAB — MAGNESIUM: Magnesium: 2.1 mg/dL (ref 1.7–2.4)

## 2022-06-13 MED ORDER — ACETAMINOPHEN 650 MG RE SUPP: 650.0000 mg | Freq: Four times a day (QID) | RECTAL | Status: DC

## 2022-06-13 MED ORDER — POTASSIUM CHLORIDE CRYS ER 20 MEQ PO TBCR
40.0000 meq | EXTENDED_RELEASE_TABLET | Freq: Once | ORAL | Status: AC
  Administered 2022-06-13: 40 meq via ORAL
  Filled 2022-06-13: qty 2

## 2022-06-13 MED ORDER — ACETAMINOPHEN 325 MG PO TABS
650.0000 mg | ORAL_TABLET | Freq: Four times a day (QID) | ORAL | Status: DC
  Administered 2022-06-13 (×2): 650 mg via ORAL
  Filled 2022-06-13 (×2): qty 2

## 2022-06-13 MED ORDER — METHOCARBAMOL 500 MG PO TABS
500.0000 mg | ORAL_TABLET | Freq: Four times a day (QID) | ORAL | Status: DC
  Administered 2022-06-13 – 2022-06-15 (×10): 500 mg via ORAL
  Filled 2022-06-13 (×10): qty 1

## 2022-06-13 MED ORDER — MELATONIN 3 MG PO TABS
3.0000 mg | ORAL_TABLET | Freq: Every evening | ORAL | Status: DC | PRN
  Administered 2022-06-13: 3 mg via ORAL
  Filled 2022-06-13 (×2): qty 1

## 2022-06-13 MED ORDER — FUROSEMIDE 10 MG/ML IJ SOLN
20.0000 mg | Freq: Once | INTRAMUSCULAR | Status: AC
  Administered 2022-06-13: 20 mg via INTRAVENOUS
  Filled 2022-06-13: qty 2

## 2022-06-13 MED ORDER — ACETAMINOPHEN 500 MG PO TABS
1000.0000 mg | ORAL_TABLET | Freq: Four times a day (QID) | ORAL | Status: DC
  Administered 2022-06-13 – 2022-06-15 (×7): 1000 mg via ORAL
  Filled 2022-06-13 (×8): qty 2

## 2022-06-13 MED ORDER — PANTOPRAZOLE SODIUM 40 MG IV SOLR
40.0000 mg | INTRAVENOUS | Status: DC
  Administered 2022-06-13 – 2022-06-15 (×3): 40 mg via INTRAVENOUS
  Filled 2022-06-13 (×3): qty 10

## 2022-06-13 NOTE — Progress Notes (Addendum)
Neurosurgery notified about low urine output and only 280 on bladder scan despite pt receiving almost 1L of NS over the night. Patient also having some cardiac rate changes over the night as well as edema in lower extremities. One time dose of '20mg'$  IV lasix as well as 58mq potassium PO.  When going over the new medications with the patient, patient stated he takes lasix everyday at home. At this time, continuous normal saline 102mhr was turned off by this RN to prevent fluid overload. Neurosurgery notified.

## 2022-06-13 NOTE — Telephone Encounter (Signed)
Called patient left Dr.[name]'s advice on personal voice mail. 

## 2022-06-13 NOTE — Progress Notes (Signed)
Speech Language Pathology Treatment:    Patient Details Name: Marcus BANCROFT Sr. MRN: [id number] DOB: Mar 17, 1932 Today's Date: 06/13/2022 Time:  -      Cognitive eval was reordered as there was slight increase in hemorrhage on repeat CT. Estill Bamberg, [place], conducted a thorough speech-language-cognitive assessment yesterday. Pt did not need services at that time and discussed/agreed upon by family.  ST reached out to RN who states that he appears the same today from an informal speech-cognitive standpoint. Full reassessment does not appear needed at this time. Please reconsult if new impairments are noted in speech-language-cognition.       Marcus [name]  06/13/2022, 2:25 PM
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
## 2022-06-13 NOTE — Progress Notes (Signed)
Transcranial Doppler  Date POD PCO2 HCT BP  MCA ACA PCA OPHT SIPH VERT Basilar  9/13    133/68 Right  Left   50  29   -109  -17   34  33   '20  19   24  '$ 54   *  -14   -23           Right  Left                                            Right  Left                                             Right  Left                                             Right  Left                                            Right  Left                                            Right  Left                                        MCA = Middle Cerebral Artery      OPHT = Opthalmic Artery     BASILAR = Basilar Artery   ACA = Anterior Cerebral Artery     SIPH = Carotid Siphon PCA = Posterior Cerebral Artery   VERT = Verterbral Artery                   Normal MCA = 62+\-12 ACA = 50+\-12 PCA = 42+\-23  *-unable to insonate  Rt lindegaard ratio: 1.9 Lt lindegaard ratio: 1.2  Marcus [name] Marcus [name] 06/13/2022 1:47 PM
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
## 2022-06-14 ENCOUNTER — Other Ambulatory Visit (HOSPITAL_COMMUNITY): Payer: Self-pay

## 2022-06-14 ENCOUNTER — Other Ambulatory Visit (HOSPITAL_COMMUNITY): Payer: Self-pay | Admitting: Interventional Radiology

## 2022-06-14 ENCOUNTER — Inpatient Hospital Stay (HOSPITAL_COMMUNITY): Payer: No Typology Code available for payment source

## 2022-06-14 HISTORY — PX: IR ANGIO INTRA EXTRACRAN SEL COM CAROTID INNOMINATE BILAT MOD SED: IMG5360

## 2022-06-14 LAB — CBC
HCT: 32.1 % — ABNORMAL LOW (ref 39.0–52.0)
Hemoglobin: 10.7 g/dL — ABNORMAL LOW (ref 13.0–17.0)
MCH: 32.2 pg (ref 26.0–34.0)
MCHC: 33.3 g/dL (ref 30.0–36.0)
MCV: 96.7 fL (ref 80.0–100.0)
Platelets: 142 10*3/uL — ABNORMAL LOW (ref 150–400)
RBC: 3.32 MIL/uL — ABNORMAL LOW (ref 4.22–5.81)
RDW: 14.4 % (ref 11.5–15.5)
WBC: 10.6 10*3/uL — ABNORMAL HIGH (ref 4.0–10.5)
nRBC: 0 % (ref 0.0–0.2)

## 2022-06-14 LAB — BASIC METABOLIC PANEL
Anion gap: 14 (ref 5–15)
BUN: 53 mg/dL — ABNORMAL HIGH (ref 8–23)
CO2: 16 mmol/L — ABNORMAL LOW (ref 22–32)
Calcium: 8.9 mg/dL (ref 8.9–10.3)
Chloride: 110 mmol/L (ref 98–111)
Creatinine, Ser: 1.97 mg/dL — ABNORMAL HIGH (ref 0.61–1.24)
GFR, Estimated: 32 mL/min — ABNORMAL LOW (ref >60)
Glucose, Bld: 107 mg/dL — ABNORMAL HIGH (ref 70–99)
Potassium: 4.5 mmol/L (ref 3.5–5.1)
Sodium: 140 mmol/L (ref 135–145)

## 2022-06-14 LAB — MAGNESIUM: Magnesium: 2.2 mg/dL (ref 1.7–2.4)

## 2022-06-14 MED ORDER — LIDOCAINE HCL (PF) 1 % IJ SOLN
INTRAMUSCULAR | Status: AC | PRN
  Administered 2022-06-14: 5 mL

## 2022-06-14 MED ORDER — MIDAZOLAM HCL 2 MG/2ML IJ SOLN
INTRAMUSCULAR | Status: AC
  Filled 2022-06-14: qty 2

## 2022-06-14 MED ORDER — HEPARIN SODIUM (PORCINE) 1000 UNIT/ML IJ SOLN
INTRAMUSCULAR | Status: AC
  Filled 2022-06-14: qty 10

## 2022-06-14 MED ORDER — FENTANYL CITRATE (PF) 100 MCG/2ML IJ SOLN
INTRAMUSCULAR | Status: AC | PRN
  Administered 2022-06-14: 25 ug via INTRAVENOUS

## 2022-06-14 MED ORDER — FENTANYL CITRATE (PF) 100 MCG/2ML IJ SOLN
INTRAMUSCULAR | Status: AC
  Filled 2022-06-14: qty 2

## 2022-06-14 MED ORDER — IOHEXOL 300 MG/ML  SOLN
100.0000 mL | Freq: Once | INTRAMUSCULAR | Status: AC | PRN
  Administered 2022-06-14: 70 mL via INTRA_ARTERIAL

## 2022-06-14 MED ORDER — MIDAZOLAM HCL 2 MG/2ML IJ SOLN
INTRAMUSCULAR | Status: AC | PRN
  Administered 2022-06-14: .5 mg via INTRAVENOUS

## 2022-06-14 MED ORDER — LIDOCAINE HCL 1 % IJ SOLN
INTRAMUSCULAR | Status: AC
  Filled 2022-06-14: qty 20

## 2022-06-14 MED ORDER — FUROSEMIDE 10 MG/ML IJ SOLN
60.0000 mg | Freq: Once | INTRAMUSCULAR | Status: AC
  Administered 2022-06-14: 60 mg via INTRAVENOUS
  Filled 2022-06-14: qty 6

## 2022-06-14 NOTE — Progress Notes (Signed)
  NEUROSURGERY BRIEF OPERATIVE  NOTE   PREOP DX: Subarachnoid Hemorrhage  POSTOP DX: Same  PROCEDURE: Diagnostic cerebral angiogram  SURGEON: Dr. Consuella Lose, MD  ANESTHESIA: IV Sedation with Local  EBL: Minimal  SPECIMENS: None  COMPLICATIONS: None  CONDITION: Stable to recovery  FINDINGS (Full report in [facility]): 1. Severely motion-degraded exam, however within limits of visualization there does not appear to be any saccular aneurysms. There appears to be a proximal right M1 stenosis with tortuous post-stenotic dilation but no saccular aneurysm. The left M2 appears to be fusiformly dilated but no saccular aneurysm is seen. 2. There is fairly diffuse intracranial atherosclerosis.   Consuella Lose, MD [facility]
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
## 2022-06-14 NOTE — Sedation Documentation (Signed)
Dr. Kathyrn Sheriff made aware that patient last INR (3.1) was drawn on 06/10/22. Per MD lab doesn't need to be recollected today because patient was given a reversal drug for anticoagulants on admission.
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
## 2022-06-14 NOTE — Sedation Documentation (Signed)
Patient assisted back over to bed from IR table by 4 staff members using slide board. Patient denies any pain or discomfort after transfer.

## 2022-06-14 NOTE — Sedation Documentation (Signed)
Patient assisted from bed to IR table by 4 staff members using slide board. Patient denies any pain or discomfort after transfer. Patient provided pillows for comfort on IR table.

## 2022-06-15 ENCOUNTER — Inpatient Hospital Stay (HOSPITAL_COMMUNITY): Payer: No Typology Code available for payment source

## 2022-06-15 DIAGNOSIS — I609 Nontraumatic subarachnoid hemorrhage, unspecified: Secondary | ICD-10-CM

## 2022-06-15 DIAGNOSIS — S066X0S Traumatic subarachnoid hemorrhage without loss of consciousness, sequela: Secondary | ICD-10-CM

## 2022-06-15 MED ORDER — PANTOPRAZOLE SODIUM 40 MG PO TBEC: 40.0000 mg | DELAYED_RELEASE_TABLET | Freq: Every day | ORAL | Status: DC

## 2022-06-15 MED ORDER — OXYCODONE HCL 5 MG PO TABS: 2.5000 mg | ORAL_TABLET | Freq: Four times a day (QID) | ORAL | 0 refills | Status: AC | PRN
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
## 2022-06-15 NOTE — Progress Notes (Signed)
Discharge information discussed with Ex wife Konstantinos Cordoba and daughter Evlyn Kanner

## 2022-06-15 NOTE — Progress Notes (Signed)
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 failed DCCV x 2. Would consider AAD therapy but with slow HR in atrial flutter this could result in further AV block necessitating a pacemaker. Will ask EP to see for recommendations.    Patient Profile     88 y.o. male  with a hx of NSTEMI 2019 s/p DES RCA w/ med rx for LAD 100% & CFX 60%, chronic combined CHF w/ EF 40% and severe MR, Afib/flutter, HTN, HLD, Hypothyroid, myasthenia gravis  Assessment & Plan    Chronic combined congestive heart failure Atrial fibrillation/flutter Coronary artery disease Subarachnoid hemorrhage-being managed by neurosurgery.  He appears to be in sinus rhythm.  Agree with holding anticoagulation for now.  In terms of his antiplatelet for coronary artery disease Plavix also has been held.  No angina symptoms at this time. Will benefit from Lasix 20 mg at least twice weekly at the time of discharge. His blood pressure is also elevated hopefully with  cautious diuretics we can see some improvement if not we will adjust his antihypertensive medication.  [place] will sign off.   Medication Recommendations: Amlodipine 10 mg daily, Lasix 20 mg twice weekly, stop Xarelto and Plavix-we will need to be cleared by neurosurgeon in the outpatient setting before restarting his medications. Other recommendations (labs, testing, etc): None Follow up as an outpatient: Follow-up with cardiology as scheduled.       Signed, Berniece Salines, DO  06/15/2022, 9:38 AM
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
## 2022-06-15 NOTE — Progress Notes (Signed)
Transcranial Doppler  Date POD PCO2 HCT BP  MCA ACA PCA OPHT SIPH VERT Basilar  9/13    133/ 68 Right  Left   50  29   -109  -17   34  33   '20  19   24  '$ 54   *  -14   -23      9/15    146/ 77 Right  Left   46  24   -43  -'18   29  13   17  22   '$ 36  30   *  -21   -22           Right  Left                                             Right  Left                                             Right  Left                                            Right  Left                                            Right  Left                                        MCA = Middle Cerebral Artery      OPHT = Opthalmic Artery     BASILAR = Basilar Artery   ACA = Anterior Cerebral Artery     SIPH = Carotid Siphon PCA = Posterior Cerebral Artery   VERT = Verterbral Artery                   Normal MCA = 62+\-12 ACA = 50+\-12 PCA = 42+\-23  *-unable to insonate   Rt lindegaard ratio: 2.7 Lt lindegaard ratio: 1.5  Darlin Coco, RDMS, RVT 06/15/2022 4:55 PM

## 2022-06-18 ENCOUNTER — Encounter: Payer: Self-pay | Admitting: Cardiology

## 2022-06-18 ENCOUNTER — Other Ambulatory Visit: Payer: Self-pay | Admitting: Cardiology

## 2022-06-18 ENCOUNTER — Ambulatory Visit (INDEPENDENT_AMBULATORY_CARE_PROVIDER_SITE_OTHER): Payer: No Typology Code available for payment source | Admitting: Cardiology

## 2022-06-18 ENCOUNTER — Ambulatory Visit: Payer: Medicare Other | Attending: Cardiology

## 2022-06-18 VITALS — BP 122/64 | HR 72 | Ht 68.0 in | Wt 156.0 lb

## 2022-06-18 DIAGNOSIS — I4891 Unspecified atrial fibrillation: Secondary | ICD-10-CM | POA: Insufficient documentation

## 2022-06-18 DIAGNOSIS — I483 Typical atrial flutter: Secondary | ICD-10-CM | POA: Diagnosis not present

## 2022-06-18 DIAGNOSIS — I4892 Unspecified atrial flutter: Secondary | ICD-10-CM

## 2022-06-18 DIAGNOSIS — R0609 Other forms of dyspnea: Secondary | ICD-10-CM | POA: Diagnosis not present

## 2022-06-18 MED ORDER — AMIODARONE HCL 200 MG PO TABS: 200.0000 mg | ORAL_TABLET | Freq: Every day | ORAL | 3 refills | Status: DC

## 2022-06-18 NOTE — Patient Instructions (Signed)
Medication Instructions:  Your physician has recommended you make the following change in your medication:  START Amiodarone 200 mg once daily  *If you need a refill on your cardiac medications before your next appointment, please call your pharmacy*   Lab Work: None ordered   Testing/Procedures:                           ZIO XT- Long Term Monitor Instructions  IN 3 WEEKS --- Your physician has requested you wear a ZIO patch monitor for 14 days.  This is a single patch monitor. [employer] supplies one patch monitor per enrollment. Additional stickers are not available. Please do not apply patch if you will be having a Nuclear Stress Test,  Echocardiogram, Cardiac CT, MRI, or Chest Xray during the period you would be wearing the  monitor. The patch cannot be worn during these tests. You cannot remove and re-apply the  ZIO XT patch monitor.  Your ZIO patch monitor will be mailed 3 day USPS to your address on file. It may take 3-5 days  to receive your monitor after you have been enrolled.  Once you have received your monitor, please review the enclosed instructions. Your monitor  has already been registered assigning a specific monitor serial # to you.  Billing and Patient Assistance Program Information  We have supplied [employer] with any of your insurance information on file for billing purposes. [employer] offers a sliding scale Patient Assistance Program for patients that do not have  insurance, or whose insurance does not completely cover the cost of the ZIO monitor.  You must apply for the Patient Assistance Program to qualify for this discounted rate.  To apply, please call [employer] at [phone], select option 4, select option 2, ask to apply for  Patient Assistance Program. Theodore Demark will ask your household income, and how many people  are in your household. They will quote your out-of-pocket cost based on that information.  [employer] will also be able to set up a 33-month  interest-free payment plan if needed.  Applying the monitor   Shave hair from upper left chest.  Hold abrader disc by orange tab. Rub abrader in 40 strokes over the upper left chest as  indicated in your monitor instructions.  Clean area with 4 enclosed alcohol pads. Let dry.  Apply patch as indicated in monitor instructions. Patch will be placed under collarbone on left  side of chest with arrow pointing upward.  Rub patch adhesive wings for 2 minutes. Remove white label marked "1". Remove the white  label marked "2". Rub patch adhesive wings for 2 additional minutes.  While looking in a mirror, press and release button in center of patch. A small green light will  flash 3-4 times. This will be your only indicator that the monitor has been turned on.  Do not shower for the first 24 hours. You may shower after the first 24 hours.  Press the button if you feel a symptom. You will hear a small click. Record Date, Time and  Symptom in the Patient Logbook.  When you are ready to remove the patch, follow instructions on the last 2 pages of Patient  Logbook. Stick patch monitor onto the last page of Patient Logbook.  Place Patient Logbook in the blue and white box. Use locking tab on box and tape box closed  securely. The blue and white box has prepaid postage on it. Please place it in the  mailbox as  soon as possible. Your physician should have your test results approximately 7 days after the  monitor has been mailed back to [facility].  Call [facility] at [phone] if you have questions regarding  your ZIO XT patch monitor. Call them immediately if you see an orange light blinking on your  monitor.  If your monitor falls off in less than 4 days, contact our Monitor department at [phone].  If your monitor becomes loose or falls off after 4 days call [employer] at [phone] for  suggestions on securing your monitor    Follow-Up: At [facility], you and  your health needs are our priority.  As part of our continuing mission to provide you with exceptional heart care, we have created designated Provider Care Teams.  These Care Teams include your primary Cardiologist (physician) and Advanced Practice Providers (APPs -  Physician Assistants and Nurse Practitioners) who all work together to provide you with the care you need, when you need it.  Your next appointment:   To be  determined after monitor is completed and reviewed by MD  The format for your next appointment:   In Person  Provider:   Allegra Lai, MD    Thank you for choosing [facility]!!   Trinidad Curet, RN [phone]    Other Instructions  Amiodarone Tablets What is this medication? AMIODARONE (a MEE oh da rone) prevents and treats a fast or irregular heartbeat (arrhythmia). It works by slowing down overactive electric signals in the heart, which stabilizes your heart rhythm. It belongs to a group of medications called antiarrhythmics. This medicine may be used for other purposes; ask your health care provider or pharmacist if you have questions. COMMON BRAND NAME(S): Cordarone, Pacerone What should I tell my care team before I take this medication? They need to know if you have any of these conditions: Liver disease Lung disease Other heart problems Thyroid disease An unusual or allergic reaction to amiodarone, iodine, other medications, foods, dyes, or preservatives Pregnant or trying to get pregnant Breast-feeding How should I use this medication? Take this medication by mouth with a glass of water. Follow the directions on the prescription label. You can take this medication with or without food. However, you should always take it the same way each time. Take your doses at regular intervals. Do not take your medication more often than directed. Do not stop taking except on the advice of your care team. A special MedGuide will be given to you by the pharmacist  with each prescription and refill. Be sure to read this information carefully each time. Talk to your care team regarding the use of this medication in children. Special care may be needed. Overdosage: If you think you have taken too much of this medicine contact a poison control center or emergency room at once. NOTE: This medicine is only for you. Do not share this medicine with others. What if I miss a dose? If you miss a dose, take it as soon as you can. If it is almost time for your next dose, take only that dose. Do not take double or extra doses. What may interact with this medication? Do not take this medication with any of the following: Abarelix Apomorphine Arsenic trioxide Certain antibiotics like erythromycin, gemifloxacin, levofloxacin, pentamidine Certain medications for depression like amoxapine, tricyclic antidepressants Certain medications for fungal infections like fluconazole, itraconazole, ketoconazole, posaconazole, voriconazole Certain medications for irregular heartbeat like disopyramide, dronedarone, ibutilide, propafenone, sotalol Certain  medications for malaria like chloroquine, halofantrine Cisapride Droperidol Haloperidol [place] Maprotiline Methadone Phenothiazines like chlorpromazine, mesoridazine, thioridazine Pimozide Ranolazine Red yeast rice Vardenafil This medication may also interact with the following: Antiviral medications for HIV or AIDS Certain medications for blood pressure, heart disease, irregular heart beat Certain medications for cholesterol like atorvastatin, cerivastatin, lovastatin, simvastatin Certain medications for hepatitis C like sofosbuvir and ledipasvir; sofosbuvir Certain medications for seizures like phenytoin Certain medications for thyroid problems Certain medications that treat or prevent blood clots like  warfarin Cholestyramine Cimetidine Clopidogrel Cyclosporine Dextromethorphan Diuretics Dofetilide Fentanyl General anesthetics Grapefruit juice Lidocaine Loratadine Methotrexate Other medications that prolong the QT interval (cause an abnormal heart rhythm) Procainamide Quinidine Rifabutin, rifampin, or rifapentine St. John's Wort Trazodone Ziprasidone This list may not describe all possible interactions. Give your health care provider a list of all the medicines, herbs, non-prescription drugs, or dietary supplements you use. Also tell them if you smoke, drink alcohol, or use illegal drugs. Some items may interact with your medicine. What should I watch for while using this medication? Your condition will be monitored closely when you first begin therapy. Often, this medication is first started in a hospital or other monitored health care setting. Once you are on maintenance therapy, visit your care team for regular checks on your progress. Because your condition and use of this medication carry some risk, it is a good idea to carry an identification card, necklace or bracelet with details of your condition, medications, and care team. You may get drowsy or dizzy. Do not drive, use machinery, or do anything that needs mental alertness until you know how this medication affects you. Do not stand or sit up quickly, especially if you are an older patient. This reduces the risk of dizzy or fainting spells. This medication can make you more sensitive to the sun. Keep out of the sun. If you cannot avoid being in the sun, wear protective clothing and use sunscreen. Do not use sun lamps or tanning beds/booths. You should have regular eye exams before and during treatment. Call your care team if you have blurred vision, see halos, or your eyes become sensitive to light. Your eyes may get dry. It may be helpful to use a lubricating eye solution or artificial tears solution. If you are going to have  surgery or a procedure that requires contrast dyes, tell your care team that you are taking this medication. What side effects may I notice from receiving this medication? Side effects that you should report to your care team as soon as possible: Allergic reactions--skin rash, itching, hives, swelling of the face, lips, tongue, or throat Bluish-gray skin Change in vision such as blurry vision, seeing halos around lights, vision loss Heart failure--shortness of breath, swelling of the ankles, feet, or hands, sudden weight gain, unusual weakness or fatigue Heart rhythm changes--fast or irregular heartbeat, dizziness, feeling faint or lightheaded, chest pain, trouble breathing High thyroid levels (hyperthyroidism)--fast or irregular heartbeat, weight loss, excessive sweating or sensitivity to heat, tremors or shaking, anxiety, nervousness, irregular menstrual cycle or spotting Liver injury--right upper belly pain, loss of appetite, nausea, light-colored stool, dark yellow or brown urine, yellowing skin or eyes, unusual weakness or fatigue Low thyroid levels (hypothyroidism)--unusual weakness or fatigue, sensitivity to cold, constipation, hair loss, dry skin, weight gain, feelings of depression Lung injury--shortness of breath or trouble breathing, cough, spitting up blood, chest pain, fever Pain, tingling, or numbness in the hands or feet, muscle weakness, trouble walking, loss of balance or coordination  Side effects that usually do not require medical attention (report to your care team if they continue or are bothersome): Nausea Vomiting This list may not describe all possible side effects. Call your doctor for medical advice about side effects. You may report side effects to FDA at 1-800-FDA-1088. Where should I keep my medication? Keep out of the reach of children and pets. Store at room temperature between 20 and 25 degrees C (68 and 77 degrees F). Protect from light. Keep container tightly  closed. Throw away any unused medication after the expiration date. NOTE: This sheet is a summary. It may not cover all possible information. If you have questions about this medicine, talk to your doctor, pharmacist, or health care provider.  2023 Elsevier/Gold Standard (2007-11-08 00:00:00)     Important Information About Sugar
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
## 2022-06-18 NOTE — Progress Notes (Unsigned)
Enrolled for [employer] to mail a ZIO XT long term holter monitor to the patients address on file.  Patient to start monitor 07/09/2022.

## 2022-06-19 NOTE — Telephone Encounter (Signed)
Spoke to patient he saw Dr.Camnitz yesterday and was prescribed Amiodarone.He will take as prescribed.He will keep follow up appointment with Dr.[name] 12/7 at 11:40 am.

## 2022-06-20 DIAGNOSIS — I10 Essential (primary) hypertension: Secondary | ICD-10-CM | POA: Diagnosis not present

## 2022-06-20 DIAGNOSIS — J439 Emphysema, unspecified: Secondary | ICD-10-CM | POA: Diagnosis not present

## 2022-06-20 DIAGNOSIS — I4892 Unspecified atrial flutter: Secondary | ICD-10-CM | POA: Diagnosis not present

## 2022-06-20 DIAGNOSIS — I609 Nontraumatic subarachnoid hemorrhage, unspecified: Secondary | ICD-10-CM | POA: Diagnosis not present

## 2022-06-20 DIAGNOSIS — G7000 Myasthenia gravis without (acute) exacerbation: Secondary | ICD-10-CM | POA: Diagnosis not present

## 2022-06-20 DIAGNOSIS — I2510 Atherosclerotic heart disease of native coronary artery without angina pectoris: Secondary | ICD-10-CM | POA: Diagnosis not present

## 2022-06-20 DIAGNOSIS — E039 Hypothyroidism, unspecified: Secondary | ICD-10-CM | POA: Diagnosis not present

## 2022-06-20 DIAGNOSIS — N1831 Chronic kidney disease, stage 3a: Secondary | ICD-10-CM | POA: Diagnosis not present

## 2022-06-20 DIAGNOSIS — I700 Atherosclerosis of aorta: Secondary | ICD-10-CM | POA: Diagnosis not present

## 2022-06-20 DIAGNOSIS — J90 Pleural effusion, not elsewhere classified: Secondary | ICD-10-CM | POA: Diagnosis not present

## 2022-06-20 DIAGNOSIS — S2242XA Multiple fractures of ribs, left side, initial encounter for closed fracture: Secondary | ICD-10-CM | POA: Diagnosis not present

## 2022-06-22 ENCOUNTER — Other Ambulatory Visit: Payer: Self-pay | Admitting: Endocrinology

## 2022-06-22 DIAGNOSIS — J90 Pleural effusion, not elsewhere classified: Secondary | ICD-10-CM

## 2022-06-27 ENCOUNTER — Ambulatory Visit (INDEPENDENT_AMBULATORY_CARE_PROVIDER_SITE_OTHER): Payer: Medicare Other | Admitting: Podiatry

## 2022-06-27 ENCOUNTER — Encounter: Payer: Self-pay | Admitting: Podiatry

## 2022-06-27 ENCOUNTER — Ambulatory Visit: Payer: Medicare Other | Admitting: Podiatry

## 2022-06-27 DIAGNOSIS — M79675 Pain in left toe(s): Secondary | ICD-10-CM

## 2022-06-27 DIAGNOSIS — M79674 Pain in right toe(s): Secondary | ICD-10-CM | POA: Diagnosis not present

## 2022-06-27 DIAGNOSIS — G629 Polyneuropathy, unspecified: Secondary | ICD-10-CM | POA: Diagnosis not present

## 2022-06-27 DIAGNOSIS — B351 Tinea unguium: Secondary | ICD-10-CM | POA: Diagnosis not present

## 2022-06-27 DIAGNOSIS — E538 Deficiency of other specified B group vitamins: Secondary | ICD-10-CM

## 2022-06-27 DIAGNOSIS — R600 Localized edema: Secondary | ICD-10-CM | POA: Diagnosis not present
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
## 2022-06-28 DIAGNOSIS — R2689 Other abnormalities of gait and mobility: Secondary | ICD-10-CM | POA: Diagnosis not present

## 2022-06-28 DIAGNOSIS — M6281 Muscle weakness (generalized): Secondary | ICD-10-CM | POA: Diagnosis not present

## 2022-06-29 ENCOUNTER — Telehealth: Payer: Self-pay | Admitting: Cardiology

## 2022-06-29 DIAGNOSIS — M6281 Muscle weakness (generalized): Secondary | ICD-10-CM | POA: Diagnosis not present

## 2022-06-29 DIAGNOSIS — R2689 Other abnormalities of gait and mobility: Secondary | ICD-10-CM | POA: Diagnosis not present

## 2022-06-29 NOTE — Telephone Encounter (Signed)
Pt states he was suppose to have a heart monitor ordered for him from his last appt with Dr. [name] and is requesting to speak to [facility], RN in regards to this. Please advise.

## 2022-07-02 ENCOUNTER — Telehealth: Payer: Self-pay | Admitting: Cardiology

## 2022-07-02 DIAGNOSIS — M6281 Muscle weakness (generalized): Secondary | ICD-10-CM | POA: Diagnosis not present

## 2022-07-02 DIAGNOSIS — I609 Nontraumatic subarachnoid hemorrhage, unspecified: Secondary | ICD-10-CM | POA: Diagnosis not present

## 2022-07-02 DIAGNOSIS — R2689 Other abnormalities of gait and mobility: Secondary | ICD-10-CM | POA: Diagnosis not present

## 2022-07-02 NOTE — Telephone Encounter (Signed)
Patient has questions regarding heart monitor and would like to RN Sherri to return his call after 4pm as he is now in an [facility] facility.
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
## 2022-07-02 NOTE — Telephone Encounter (Signed)
See today's telephone note for further on this call

## 2022-07-03 ENCOUNTER — Ambulatory Visit
Admission: RE | Admit: 2022-07-03 | Discharge: 2022-07-03 | Disposition: A | Discharge status: Home / Self Care | Payer: Medicare Other | Source: Ambulatory Visit | Attending: Endocrinology | Admitting: Endocrinology

## 2022-07-03 ENCOUNTER — Ambulatory Visit
Admission: RE | Admit: 2022-07-03 | Discharge: 2022-07-03 | Disposition: A | Discharge status: Home / Self Care | Payer: Medicare Other | Source: Ambulatory Visit | Attending: Neurosurgery | Admitting: Neurosurgery

## 2022-07-03 ENCOUNTER — Other Ambulatory Visit: Payer: Self-pay | Admitting: Neurosurgery

## 2022-07-03 DIAGNOSIS — I129 Hypertensive chronic kidney disease with stage 1 through stage 4 chronic kidney disease, or unspecified chronic kidney disease: Secondary | ICD-10-CM | POA: Diagnosis not present

## 2022-07-03 DIAGNOSIS — I4892 Unspecified atrial flutter: Secondary | ICD-10-CM | POA: Diagnosis not present

## 2022-07-03 DIAGNOSIS — I609 Nontraumatic subarachnoid hemorrhage, unspecified: Secondary | ICD-10-CM

## 2022-07-03 DIAGNOSIS — S2242XD Multiple fractures of ribs, left side, subsequent encounter for fracture with routine healing: Secondary | ICD-10-CM | POA: Diagnosis not present

## 2022-07-03 DIAGNOSIS — J439 Emphysema, unspecified: Secondary | ICD-10-CM | POA: Diagnosis not present

## 2022-07-03 DIAGNOSIS — J90 Pleural effusion, not elsewhere classified: Secondary | ICD-10-CM | POA: Diagnosis not present

## 2022-07-03 DIAGNOSIS — N1831 Chronic kidney disease, stage 3a: Secondary | ICD-10-CM | POA: Diagnosis not present

## 2022-07-03 DIAGNOSIS — S0990XA Unspecified injury of head, initial encounter: Secondary | ICD-10-CM | POA: Diagnosis not present

## 2022-07-03 DIAGNOSIS — S2242XA Multiple fractures of ribs, left side, initial encounter for closed fracture: Secondary | ICD-10-CM | POA: Diagnosis not present

## 2022-07-03 MED ORDER — IOPAMIDOL (ISOVUE-300) INJECTION 61%
50.0000 mL | Freq: Once | INTRAVENOUS | Status: AC | PRN
  Administered 2022-07-03: 50 mL via INTRAVENOUS

## 2022-07-04 ENCOUNTER — Institutional Professional Consult (permissible substitution): Payer: Medicare Other | Admitting: Cardiology

## 2022-07-04 DIAGNOSIS — M6281 Muscle weakness (generalized): Secondary | ICD-10-CM | POA: Diagnosis not present

## 2022-07-04 DIAGNOSIS — R2689 Other abnormalities of gait and mobility: Secondary | ICD-10-CM | POA: Diagnosis not present

## 2022-07-05 ENCOUNTER — Telehealth: Payer: Self-pay | Admitting: Cardiology

## 2022-07-05 DIAGNOSIS — M6281 Muscle weakness (generalized): Secondary | ICD-10-CM | POA: Diagnosis not present

## 2022-07-05 DIAGNOSIS — R2689 Other abnormalities of gait and mobility: Secondary | ICD-10-CM | POA: Diagnosis not present

## 2022-07-05 NOTE — Telephone Encounter (Signed)
Called pt advised Marcus [name] is not in the office asked if there anything I can assist with.  Reports received Zio monitor in the mail.  Has not opened box. Advised pt to open box read instructions and if still has difficulty to call our office back.  Pt currently at [facility].  Advised to take box to medical staff and ask for assistance if needed.  Also advised to call back if needs further assistance and we would be happy to assist with applying monitor.  Pt had no further concerns.

## 2022-07-05 NOTE — Telephone Encounter (Signed)
Patient calling to speak to [facility] bout his machine. Please advise

## 2022-07-06 ENCOUNTER — Encounter: Payer: Self-pay | Admitting: Cardiology

## 2022-07-06 ENCOUNTER — Telehealth: Payer: Self-pay | Admitting: Cardiology

## 2022-07-06 DIAGNOSIS — I4892 Unspecified atrial flutter: Secondary | ICD-10-CM | POA: Diagnosis not present

## 2022-07-06 DIAGNOSIS — I4891 Unspecified atrial fibrillation: Secondary | ICD-10-CM

## 2022-07-06 DIAGNOSIS — R0609 Other forms of dyspnea: Secondary | ICD-10-CM | POA: Diagnosis not present

## 2022-07-06 NOTE — Telephone Encounter (Signed)
Spoke to [place] at [place] advice given.

## 2022-07-06 NOTE — Telephone Encounter (Signed)
Attempted to contact [place], LVM to call back to discuss.

## 2022-07-06 NOTE — Telephone Encounter (Signed)
Pt c/o medication issue:  1. Name of Medication: Oxygen  2. How are you currently taking this medication (dosage and times per day)?   3. Are you having a reaction (difficulty breathing--STAT)?   4. What is your medication issue? Pt is requesting an oxygen order be submitted for peace of mind and as needed for his room at [place] at [facility]. Please advise.

## 2022-07-07 ENCOUNTER — Encounter (HOSPITAL_COMMUNITY): Payer: Self-pay | Admitting: Emergency Medicine

## 2022-07-07 ENCOUNTER — Inpatient Hospital Stay (HOSPITAL_COMMUNITY): Payer: Medicare Other

## 2022-07-07 ENCOUNTER — Emergency Department (HOSPITAL_COMMUNITY): Payer: Medicare Other

## 2022-07-07 ENCOUNTER — Other Ambulatory Visit: Payer: Self-pay

## 2022-07-07 ENCOUNTER — Inpatient Hospital Stay (HOSPITAL_COMMUNITY)
Admission: EM | Admit: 2022-07-07 | Discharge: 2022-07-09 | DRG: 291 | Disposition: A | Discharge status: Home Health | Payer: Medicare Other | Source: Skilled Nursing Facility | Attending: Internal Medicine | Admitting: Internal Medicine

## 2022-07-07 DIAGNOSIS — E785 Hyperlipidemia, unspecified: Secondary | ICD-10-CM | POA: Diagnosis present

## 2022-07-07 DIAGNOSIS — Z8701 Personal history of pneumonia (recurrent): Secondary | ICD-10-CM

## 2022-07-07 DIAGNOSIS — I4892 Unspecified atrial flutter: Secondary | ICD-10-CM | POA: Diagnosis present

## 2022-07-07 DIAGNOSIS — I714 Abdominal aortic aneurysm, without rupture, unspecified: Secondary | ICD-10-CM | POA: Diagnosis not present

## 2022-07-07 DIAGNOSIS — I252 Old myocardial infarction: Secondary | ICD-10-CM | POA: Diagnosis not present

## 2022-07-07 DIAGNOSIS — I10 Essential (primary) hypertension: Secondary | ICD-10-CM | POA: Diagnosis present

## 2022-07-07 DIAGNOSIS — J9601 Acute respiratory failure with hypoxia: Secondary | ICD-10-CM

## 2022-07-07 DIAGNOSIS — E1122 Type 2 diabetes mellitus with diabetic chronic kidney disease: Secondary | ICD-10-CM | POA: Diagnosis present

## 2022-07-07 DIAGNOSIS — R0602 Shortness of breath: Secondary | ICD-10-CM | POA: Diagnosis not present

## 2022-07-07 DIAGNOSIS — E032 Hypothyroidism due to medicaments and other exogenous substances: Secondary | ICD-10-CM | POA: Diagnosis not present

## 2022-07-07 DIAGNOSIS — N1831 Chronic kidney disease, stage 3a: Secondary | ICD-10-CM | POA: Diagnosis not present

## 2022-07-07 DIAGNOSIS — Z8249 Family history of ischemic heart disease and other diseases of the circulatory system: Secondary | ICD-10-CM

## 2022-07-07 DIAGNOSIS — Z7901 Long term (current) use of anticoagulants: Secondary | ICD-10-CM

## 2022-07-07 DIAGNOSIS — E039 Hypothyroidism, unspecified: Secondary | ICD-10-CM | POA: Diagnosis present

## 2022-07-07 DIAGNOSIS — I482 Chronic atrial fibrillation, unspecified: Secondary | ICD-10-CM | POA: Diagnosis present

## 2022-07-07 DIAGNOSIS — I6521 Occlusion and stenosis of right carotid artery: Secondary | ICD-10-CM | POA: Diagnosis present

## 2022-07-07 DIAGNOSIS — I5033 Acute on chronic diastolic (congestive) heart failure: Secondary | ICD-10-CM | POA: Diagnosis not present

## 2022-07-07 DIAGNOSIS — Z8679 Personal history of other diseases of the circulatory system: Secondary | ICD-10-CM

## 2022-07-07 DIAGNOSIS — Z87891 Personal history of nicotine dependence: Secondary | ICD-10-CM

## 2022-07-07 DIAGNOSIS — D649 Anemia, unspecified: Secondary | ICD-10-CM | POA: Diagnosis not present

## 2022-07-07 DIAGNOSIS — L89312 Pressure ulcer of right buttock, stage 2: Secondary | ICD-10-CM | POA: Diagnosis not present

## 2022-07-07 DIAGNOSIS — I4510 Unspecified right bundle-branch block: Secondary | ICD-10-CM | POA: Diagnosis present

## 2022-07-07 DIAGNOSIS — I700 Atherosclerosis of aorta: Secondary | ICD-10-CM | POA: Diagnosis not present

## 2022-07-07 DIAGNOSIS — E871 Hypo-osmolality and hyponatremia: Secondary | ICD-10-CM | POA: Diagnosis present

## 2022-07-07 DIAGNOSIS — I672 Cerebral atherosclerosis: Secondary | ICD-10-CM | POA: Diagnosis present

## 2022-07-07 DIAGNOSIS — J9621 Acute and chronic respiratory failure with hypoxia: Secondary | ICD-10-CM | POA: Diagnosis present

## 2022-07-07 DIAGNOSIS — I5043 Acute on chronic combined systolic (congestive) and diastolic (congestive) heart failure: Principal | ICD-10-CM | POA: Diagnosis present

## 2022-07-07 DIAGNOSIS — I2582 Chronic total occlusion of coronary artery: Secondary | ICD-10-CM | POA: Diagnosis present

## 2022-07-07 DIAGNOSIS — R7989 Other specified abnormal findings of blood chemistry: Secondary | ICD-10-CM | POA: Diagnosis not present

## 2022-07-07 DIAGNOSIS — I130 Hypertensive heart and chronic kidney disease with heart failure and stage 1 through stage 4 chronic kidney disease, or unspecified chronic kidney disease: Secondary | ICD-10-CM | POA: Diagnosis present

## 2022-07-07 DIAGNOSIS — N189 Chronic kidney disease, unspecified: Secondary | ICD-10-CM | POA: Diagnosis not present

## 2022-07-07 DIAGNOSIS — Z888 Allergy status to other drugs, medicaments and biological substances status: Secondary | ICD-10-CM

## 2022-07-07 DIAGNOSIS — I452 Bifascicular block: Secondary | ICD-10-CM | POA: Diagnosis present

## 2022-07-07 DIAGNOSIS — G7000 Myasthenia gravis without (acute) exacerbation: Secondary | ICD-10-CM | POA: Diagnosis present

## 2022-07-07 DIAGNOSIS — I083 Combined rheumatic disorders of mitral, aortic and tricuspid valves: Secondary | ICD-10-CM | POA: Diagnosis not present

## 2022-07-07 DIAGNOSIS — D539 Nutritional anemia, unspecified: Secondary | ICD-10-CM | POA: Diagnosis present

## 2022-07-07 DIAGNOSIS — Z89022 Acquired absence of left finger(s): Secondary | ICD-10-CM

## 2022-07-07 DIAGNOSIS — J9811 Atelectasis: Secondary | ICD-10-CM | POA: Diagnosis present

## 2022-07-07 DIAGNOSIS — I517 Cardiomegaly: Secondary | ICD-10-CM | POA: Diagnosis not present

## 2022-07-07 DIAGNOSIS — Z7902 Long term (current) use of antithrombotics/antiplatelets: Secondary | ICD-10-CM

## 2022-07-07 DIAGNOSIS — Z955 Presence of coronary angioplasty implant and graft: Secondary | ICD-10-CM

## 2022-07-07 DIAGNOSIS — J439 Emphysema, unspecified: Secondary | ICD-10-CM | POA: Diagnosis not present

## 2022-07-07 DIAGNOSIS — I110 Hypertensive heart disease with heart failure: Secondary | ICD-10-CM | POA: Diagnosis not present

## 2022-07-07 DIAGNOSIS — Z79890 Hormone replacement therapy (postmenopausal): Secondary | ICD-10-CM

## 2022-07-07 DIAGNOSIS — Z87820 Personal history of traumatic brain injury: Secondary | ICD-10-CM

## 2022-07-07 DIAGNOSIS — E875 Hyperkalemia: Secondary | ICD-10-CM | POA: Diagnosis not present

## 2022-07-07 DIAGNOSIS — I2510 Atherosclerotic heart disease of native coronary artery without angina pectoris: Secondary | ICD-10-CM | POA: Diagnosis not present

## 2022-07-07 DIAGNOSIS — Z79899 Other long term (current) drug therapy: Secondary | ICD-10-CM

## 2022-07-07 DIAGNOSIS — I480 Paroxysmal atrial fibrillation: Secondary | ICD-10-CM | POA: Diagnosis present

## 2022-07-07 DIAGNOSIS — N179 Acute kidney failure, unspecified: Secondary | ICD-10-CM | POA: Diagnosis present

## 2022-07-07 DIAGNOSIS — I509 Heart failure, unspecified: Principal | ICD-10-CM

## 2022-07-07 LAB — ECHOCARDIOGRAM COMPLETE
AR max vel: 1.41 cm2
AV Area VTI: 0.92 cm2
AV Area mean vel: 1.29 cm2
AV Mean grad: 18 mmHg
AV Peak grad: 32.3 mmHg
Ao pk vel: 2.84 m/s
Area-P 1/2: 3.77 cm2
MV M vel: 4.66 m/s
MV Peak grad: 86.7 mmHg
S' Lateral: 2.9 cm

## 2022-07-07 LAB — PROTIME-INR
INR: 2.5 — ABNORMAL HIGH (ref 0.8–1.2)
Prothrombin Time: 26.4 seconds — ABNORMAL HIGH (ref 11.4–15.2)

## 2022-07-07 LAB — URINALYSIS, ROUTINE W REFLEX MICROSCOPIC
Bacteria, UA: NONE SEEN
Bilirubin Urine: NEGATIVE
Glucose, UA: NEGATIVE mg/dL
Ketones, ur: NEGATIVE mg/dL
Leukocytes,Ua: NEGATIVE
Nitrite: NEGATIVE
Protein, ur: NEGATIVE mg/dL
Specific Gravity, Urine: 1.008 (ref 1.005–1.030)
pH: 5 (ref 5.0–8.0)

## 2022-07-07 LAB — POTASSIUM: Potassium: 4.7 mmol/L (ref 3.5–5.1)

## 2022-07-07 LAB — COMPREHENSIVE METABOLIC PANEL
ALT: 29 U/L (ref 0–44)
AST: 32 U/L (ref 15–41)
Albumin: 3.7 g/dL (ref 3.5–5.0)
Alkaline Phosphatase: 115 U/L (ref 38–126)
Anion gap: 11 (ref 5–15)
BUN: 52 mg/dL — ABNORMAL HIGH (ref 8–23)
CO2: 17 mmol/L — ABNORMAL LOW (ref 22–32)
Calcium: 8.8 mg/dL — ABNORMAL LOW (ref 8.9–10.3)
Chloride: 105 mmol/L (ref 98–111)
Creatinine, Ser: 1.83 mg/dL — ABNORMAL HIGH (ref 0.61–1.24)
GFR, Estimated: 35 mL/min — ABNORMAL LOW (ref >60)
Glucose, Bld: 90 mg/dL (ref 70–99)
Potassium: 5.5 mmol/L — ABNORMAL HIGH (ref 3.5–5.1)
Sodium: 133 mmol/L — ABNORMAL LOW (ref 135–145)
Total Bilirubin: 0.6 mg/dL (ref 0.3–1.2)
Total Protein: 6.7 g/dL (ref 6.5–8.1)

## 2022-07-07 LAB — CBC
HCT: 31.5 % — ABNORMAL LOW (ref 39.0–52.0)
Hemoglobin: 10.3 g/dL — ABNORMAL LOW (ref 13.0–17.0)
MCH: 33 pg (ref 26.0–34.0)
MCHC: 32.7 g/dL (ref 30.0–36.0)
MCV: 101 fL — ABNORMAL HIGH (ref 80.0–100.0)
Platelets: 176 10*3/uL (ref 150–400)
RBC: 3.12 MIL/uL — ABNORMAL LOW (ref 4.22–5.81)
RDW: 16.2 % — ABNORMAL HIGH (ref 11.5–15.5)
WBC: 6.2 10*3/uL (ref 4.0–10.5)
nRBC: 0 % (ref 0.0–0.2)

## 2022-07-07 LAB — SODIUM, URINE, RANDOM: Sodium, Ur: 64 mmol/L

## 2022-07-07 LAB — CREATININE, URINE, RANDOM: Creatinine, Urine: 26 mg/dL

## 2022-07-07 LAB — TROPONIN I (HIGH SENSITIVITY)
Troponin I (High Sensitivity): 22 ng/L — ABNORMAL HIGH (ref <18)
Troponin I (High Sensitivity): 22 ng/L — ABNORMAL HIGH (ref <18)

## 2022-07-07 LAB — MAGNESIUM: Magnesium: 2.3 mg/dL (ref 1.7–2.4)

## 2022-07-07 LAB — BRAIN NATRIURETIC PEPTIDE: B Natriuretic Peptide: 978.6 pg/mL — ABNORMAL HIGH (ref 0.0–100.0)

## 2022-07-07 LAB — TSH: TSH: 4.359 u[IU]/mL (ref 0.350–4.500)

## 2022-07-07 LAB — LACTIC ACID, PLASMA: Lactic Acid, Venous: 1.1 mmol/L (ref 0.5–1.9)

## 2022-07-07 MED ORDER — CARBOXYMETHYLCELLULOSE SODIUM 0.25 % OP SOLN: 2.0000 [drp] | Freq: Three times a day (TID) | OPHTHALMIC | Status: DC

## 2022-07-07 MED ORDER — AMIODARONE HCL 200 MG PO TABS
200.0000 mg | ORAL_TABLET | Freq: Every day | ORAL | Status: DC
  Administered 2022-07-08 – 2022-07-09 (×2): 200 mg via ORAL
  Filled 2022-07-07 (×2): qty 1

## 2022-07-07 MED ORDER — FUROSEMIDE 10 MG/ML IJ SOLN
40.0000 mg | Freq: Two times a day (BID) | INTRAMUSCULAR | Status: DC
  Administered 2022-07-07 – 2022-07-08 (×4): 40 mg via INTRAVENOUS
  Filled 2022-07-07 (×4): qty 4

## 2022-07-07 MED ORDER — TRIPLE ANTIBIOTIC 3.5-400-5000 EX OINT
1.0000 | TOPICAL_OINTMENT | Freq: Every day | CUTANEOUS | Status: DC
  Administered 2022-07-08 – 2022-07-09 (×2): 1 via CUTANEOUS
  Filled 2022-07-07 (×4): qty 1

## 2022-07-07 MED ORDER — LEVOTHYROXINE SODIUM 25 MCG PO TABS
137.0000 ug | ORAL_TABLET | Freq: Every day | ORAL | Status: DC
  Administered 2022-07-08 – 2022-07-09 (×2): 137 ug via ORAL
  Filled 2022-07-07 (×2): qty 1

## 2022-07-07 MED ORDER — BOOST PO LIQD: 237.0000 mL | Freq: Four times a day (QID) | ORAL | Status: DC | PRN

## 2022-07-07 MED ORDER — POLYVINYL ALCOHOL 1.4 % OP SOLN
1.0000 [drp] | Freq: Three times a day (TID) | OPHTHALMIC | Status: DC
  Administered 2022-07-07 – 2022-07-09 (×5): 1 [drp] via OPHTHALMIC
  Filled 2022-07-07: qty 15

## 2022-07-07 MED ORDER — FUROSEMIDE 10 MG/ML IJ SOLN
40.0000 mg | Freq: Once | INTRAMUSCULAR | Status: AC
  Administered 2022-07-07: 40 mg via INTRAVENOUS
  Filled 2022-07-07: qty 4

## 2022-07-07 MED ORDER — AMLODIPINE BESYLATE 10 MG PO TABS
10.0000 mg | ORAL_TABLET | Freq: Every day | ORAL | Status: DC
  Administered 2022-07-08 – 2022-07-09 (×2): 10 mg via ORAL
  Filled 2022-07-07 (×2): qty 1

## 2022-07-07 MED ORDER — OCUSOFT LID SCRUB FOAMING EX SOLN: Freq: Two times a day (BID) | CUTANEOUS | Status: DC

## 2022-07-07 MED ORDER — POTASSIUM CHLORIDE CRYS ER 20 MEQ PO TBCR: 20.0000 meq | EXTENDED_RELEASE_TABLET | Freq: Every day | ORAL | Status: DC

## 2022-07-07 MED ORDER — ALBUTEROL SULFATE (2.5 MG/3ML) 0.083% IN NEBU: 2.5000 mg | INHALATION_SOLUTION | RESPIRATORY_TRACT | Status: DC | PRN

## 2022-07-07 MED ORDER — ACETAMINOPHEN 325 MG PO TABS: 650.0000 mg | ORAL_TABLET | Freq: Four times a day (QID) | ORAL | Status: DC | PRN

## 2022-07-07 MED ORDER — NITROGLYCERIN 0.4 MG SL SUBL
0.4000 mg | SUBLINGUAL_TABLET | SUBLINGUAL | Status: DC | PRN
  Filled 2022-07-07: qty 1

## 2022-07-07 MED ORDER — DOCUSATE SODIUM 100 MG PO CAPS: 100.0000 mg | ORAL_CAPSULE | Freq: Every day | ORAL | Status: DC | PRN

## 2022-07-07 MED ORDER — CLOPIDOGREL BISULFATE 75 MG PO TABS
75.0000 mg | ORAL_TABLET | Freq: Every day | ORAL | Status: DC
  Administered 2022-07-08 – 2022-07-09 (×2): 75 mg via ORAL
  Filled 2022-07-07 (×2): qty 1

## 2022-07-07 MED ORDER — SODIUM CHLORIDE 0.9% FLUSH
3.0000 mL | Freq: Two times a day (BID) | INTRAVENOUS | Status: DC
  Administered 2022-07-07 – 2022-07-09 (×5): 3 mL via INTRAVENOUS

## 2022-07-07 MED ORDER — RIVAROXABAN 15 MG PO TABS
15.0000 mg | ORAL_TABLET | Freq: Every evening | ORAL | Status: DC
  Administered 2022-07-08: 15 mg via ORAL
  Filled 2022-07-07 (×2): qty 1

## 2022-07-07 MED ORDER — ACETAMINOPHEN 650 MG RE SUPP: 650.0000 mg | Freq: Four times a day (QID) | RECTAL | Status: DC | PRN

## 2022-07-07 NOTE — ED Notes (Signed)
Received patient in on Bipap machine saturating at 100%. Patient is normotensive . No acute distress noted . Continuing monitoring and care . Pending Admission bed .

## 2022-07-07 NOTE — ED Triage Notes (Signed)
Pt reported to ED with c/o shortness of breath and leg swelling x 2 weeks. Pt has recently had increase in lasix d/t the same but wife reports swelling has not changed. Pt also endorses feeling lightheaded.

## 2022-07-07 NOTE — ED Provider Notes (Signed)
[facility] Emergency Department Provider Note MRN:  [id number]  Arrival date & time: 07/07/22     Chief Complaint   Shortness of Breath   History of Present Illness   Marcus MOODY Sr. is a 88 y.o. year-old male with a history of CHF, CAD presenting to the ED with chief complaint of shortness of breath.  Worsening shortness of breath over the past several days.  Worsening lower extremity edema.  Lasix has been increased from 20 to 40 mg, not helping.  No chest pain, no fever or cough, no abdominal pain.  Review of Systems  A thorough review of systems was obtained and all systems are negative except as noted in the HPI and PMH.   Patient's Health History    Past Medical History:  Diagnosis Date   Acute on chronic combined systolic and diastolic CHF (congestive heart failure) ([place]) 08/22/2018   Acute on chronic respiratory failure (HCC) 04/15/2015   Acute renal failure (HCC) 04/15/2015   Anemia    Atrial flutter (HCC)    Bradycardia    CAD in native artery 08/22/2018   CAP (community acquired pneumonia) 04/13/2015   See cxr 04/13/2015 > admit [facility]     Chest pain 38/25/0539   Complication of anesthesia    H/O hiatal hernia    Hyperlipidemia LDL goal <70 08/22/2018   Hypertension 08/22/2018   Hypothyroidism 04/13/2015   Myasthenia gravis ([place]) 04/13/2015   Non-ST elevation (NSTEMI) myocardial infarction ([place])    Obesity 04/13/2015   PAF (paroxysmal atrial fibrillation) ([place]) 04/16/2015   RBBB    S/P angioplasty with stent 08/21/18 DES to RCA 08/22/2018   Sepsis ([place]) 04/15/2015   Thrombocytopenia ([place])     Past Surgical History:  Procedure Laterality Date   AMPUTATION  06/11/2012   Procedure: AMPUTATION DIGIT;  Surgeon: [name], MD;  Location: [place];  Service: Orthopedics;  Laterality: Left;  revision of amputation   CARDIOVERSION N/A 03/24/2020   Procedure: CARDIOVERSION;  Surgeon: [name], [name], MD;  Location: [facility] ENDOSCOPY;   Service: Cardiovascular;  Laterality: N/A;   CARDIOVERSION N/A 02/14/2021   Procedure: CARDIOVERSION;  Surgeon: [name], MD;  Location: [place];  Service: Cardiovascular;  Laterality: N/A;   CARDIOVERSION N/A 11/03/2021   Procedure: CARDIOVERSION;  Surgeon: Buford Dresser, MD;  Location: [facility] ENDOSCOPY;  Service: Cardiovascular;  Laterality: N/A;   CORONARY STENT INTERVENTION N/A 08/21/2018   Procedure: CORONARY STENT INTERVENTION;  Surgeon: [name], MD;  Location: [place] CV LAB;  Service: Cardiovascular;  Laterality: N/A;   CORONARY STENT INTERVENTION N/A 10/13/2021   Procedure: CORONARY STENT INTERVENTION;  Surgeon: Burnell Blanks, MD;  Location: [place] CV LAB;  Service: Cardiovascular;  Laterality: N/A;   HERNIA REPAIR  1994   IR ANGIO INTRA EXTRACRAN SEL COM CAROTID INNOMINATE BILAT MOD SED  06/14/2022   LEFT HEART CATH AND CORONARY ANGIOGRAPHY N/A 08/21/2018   Procedure: LEFT HEART CATH AND CORONARY ANGIOGRAPHY;  Surgeon: [name], MD;  Location: [place] [facility];  Service: Cardiovascular;  Laterality: N/A;   LEFT HEART CATH AND CORONARY ANGIOGRAPHY N/A 10/13/2021   Procedure: LEFT HEART CATH AND CORONARY ANGIOGRAPHY;  Surgeon: Burnell Blanks, MD;  Location: [place] [facility];  Service: Cardiovascular;  Laterality: N/A;   ORIF SHOULDER FRACTURE  06/13/2012   Procedure: OPEN REDUCTION INTERNAL FIXATION (ORIF) SHOULDER FRACTURE;  Surgeon: [name], MD;  Location: [place];  Service: Orthopedics;  Laterality: Left;  LEFT  SHOULDER OPEN GREATER TUBEROSITY ORIF   RIGHT/LEFT HEART CATH AND CORONARY ANGIOGRAPHY N/A 05/11/2022   Procedure: RIGHT/LEFT HEART CATH AND CORONARY ANGIOGRAPHY;  Surgeon: Martinique, [name], MD;  Location: [place] [facility] LAB;  Service: Cardiovascular;  Laterality: N/A;   SHOULDER CLOSED REDUCTION  06/11/2012   Procedure: CLOSED MANIPULATION SHOULDER;  Surgeon: [name], MD;  Location: [place];  Service:  Orthopedics;  Laterality: Left;    Family History  Problem Relation Age of Onset   Other Mother        Deceased, 80   Heart attack Father        Deceased, 55   Healthy Sister    Healthy Daughter        x 4   Healthy Son        x 3    Social History   Socioeconomic History   Marital status: Divorced    Spouse name: Not on file   Number of children: 7   Years of education: Not on file   Highest education level: Not on file  Occupational History   Not on file  Tobacco Use   Smoking status: Former    Packs/day: 1.00    Years: 30.00    Total pack years: 30.00    Types: Cigarettes    Quit date: 01/10/1982    Years since quitting: 40.5   Smokeless tobacco: Never   Tobacco comments:    Former smoker 10/17/2021  Vaping Use   Vaping Use: Never used  Substance and Sexual Activity   Alcohol use: Not Currently    Alcohol/week: 2.0 standard drinks of alcohol    Types: 2 Glasses of wine per week    Comment: No alcohol since 10/14/2021   Drug use: No   Sexual activity: Never  Other Topics Concern   Not on file  Social History Narrative   Lives alone in a two story home.  Divorced.  Has 7 children.     Retired from [employer].     Education: some college.   Right Handed   Social Determinants of Health   Financial Resource Strain: Not on file  Food Insecurity: Not on file  Transportation Needs: Not on file  Physical Activity: Not on file  Stress: Not on file  Social Connections: Not on file  Intimate Partner Violence: Not on file     Physical Exam   Vitals:   07/07/22 0600 07/07/22 0615  BP: (!) 159/77 (!) 157/77  Pulse: (!) 51 (!) 52  Resp: 14 16  Temp:    SpO2: 100% 100%    CONSTITUTIONAL: Chronically ill-appearing, NAD NEURO/PSYCH:  Alert and oriented x 3, no focal deficits EYES:  eyes equal and reactive ENT/NECK:  no LAD, JVD noted CARDIO: Regular rate, well-perfused, normal S1 and S2 PULM: Decreased breath sounds, tachypneic GI/GU:   non-distended, non-tender MSK/SPINE:  No gross deformities, 2+ pitting edema to bilateral lower extremities SKIN:  no rash, atraumatic   *Additional and/or pertinent findings included in MDM below  Diagnostic and Interventional Summary    EKG Interpretation  Date/Time:  Saturday July 07 2022 04:34:12 EDT Ventricular Rate:  50 PR Interval:  146 QRS Duration: 165 QT Interval:  511 QTC Calculation: 466 R Axis:   -65 Text Interpretation: Atrial flutter RBBB and LAFB Confirmed by Gerlene Fee [phone]) on 07/07/2022 5:24:22 AM       Labs Reviewed  CBC - Abnormal; Notable for the following components:      Result  Value   RBC 3.12 (*)    Hemoglobin 10.3 (*)    HCT 31.5 (*)    MCV 101.0 (*)    RDW 16.2 (*)    All other components within normal limits  COMPREHENSIVE METABOLIC PANEL - Abnormal; Notable for the following components:   Sodium 133 (*)    Potassium 5.5 (*)    CO2 17 (*)    BUN 52 (*)    Creatinine, Ser 1.83 (*)    Calcium 8.8 (*)    GFR, Estimated 35 (*)    All other components within normal limits  BRAIN NATRIURETIC PEPTIDE - Abnormal; Notable for the following components:   B Natriuretic Peptide 978.6 (*)    All other components within normal limits  PROTIME-INR - Abnormal; Notable for the following components:   Prothrombin Time 26.4 (*)    INR 2.5 (*)    All other components within normal limits  TROPONIN I (HIGH SENSITIVITY) - Abnormal; Notable for the following components:   Troponin I (High Sensitivity) 22 (*)    All other components within normal limits  LACTIC ACID, PLASMA  TROPONIN I (HIGH SENSITIVITY)    DG Chest Port 1 View  Final Result      Medications  nitroGLYCERIN (NITROSTAT) SL tablet 0.4 mg (has no administration in time range)  furosemide (LASIX) injection 40 mg (40 mg Intravenous Given 07/07/22 0446)     Procedures  /  Critical Care .Critical Care  Performed by: Maudie Flakes, MD Authorized by: Maudie Flakes, MD    Critical care provider statement:    Critical care time (minutes):  35   Critical care was necessary to treat or prevent imminent or life-threatening deterioration of the following conditions: CHF exacerbation.   Critical care was time spent personally by me on the following activities:  Development of treatment plan with patient or surrogate, discussions with consultants, evaluation of patient's response to treatment, examination of patient, ordering and review of laboratory studies, ordering and review of radiographic studies, ordering and performing treatments and interventions, pulse oximetry, re-evaluation of patient's condition and review of old charts   ED Course and Medical Decision Making  Initial Impression and Ddx History and exam are consistent with CHF exacerbation.  Patient initially on amiodarone, more recently has been trying to increase the Lasix at home.  Fluid overloaded on exam, increased work of breathing with tachypnea, JVD.  No hypoxia.  We will start BiPAP, Lasix, provide dose of nitroglycerin, reassess.  Past medical/surgical history that increases complexity of ED encounter: Atrial flutter, CHF  Interpretation of Diagnostics I personally reviewed the EKG and my interpretation is as follows: Atrial flutter with variable response  Labs reveal mild hyperkalemia, acidosis noted, creatinine is at or near baseline, otherwise no significant blood count or electrolyte disturbance.  Troponin minimally elevated, BNP is elevated.  Patient Reassessment and Ultimate Disposition/Management     Admitted to medicine for further care.  Patient management required discussion with the following services or consulting groups:  Hospitalist Service  Complexity of Problems Addressed Acute illness or injury that poses threat of life of bodily function  Additional Data Reviewed and Analyzed Further history obtained from: Further history from spouse/family member  Additional Factors  Impacting ED Encounter Risk Consideration of hospitalization  Barth Kirks. [place]onia Small, [place] [email or web address]  Final Clinical Impressions(s) / ED Diagnoses     ICD-10-CM   1. Acute on chronic congestive heart failure, unspecified heart failure  type [facility])  I50.9       ED Discharge Orders     None        Discharge Instructions Discussed with and Provided to Patient:   Discharge Instructions   None      Maudie Flakes, MD 07/07/22 0700
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
## 2022-07-07 NOTE — Progress Notes (Signed)
Patient taken off of bipap and placed on 4L [place].  Currently tolerating well with sats of 100%.  Will continue to monitor.

## 2022-07-07 NOTE — ED Notes (Signed)
Pt continues to call out about getting a lunch tray, MD was contacted to obtain an diet order, Pt educated that the RN is working on getting his lunch ordered and to him and pt continues to grow increasingly agitated
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
## 2022-07-07 NOTE — Progress Notes (Signed)
Pt arrived to unit from ED  VSS, A/O x 4,  CCMD called ,CHG given, pt oriented to unit,Will continue to monitor.   Albin Felling Zi Sek, RN    07/07/22 1800  Vitals  Temp (!) 97.4 F (36.3 C)  Temp Source Oral  BP (!) 144/77  MAP (mmHg) 94  BP Location Right Arm  BP Method Automatic  Patient Position (if appropriate) Lying  Pulse Rate (!) 56  Pulse Rate Source Monitor  ECG Heart Rate (!) 55  Resp 20  Level of Consciousness  Level of Consciousness Alert  Oxygen Therapy  SpO2 100 %  O2 Device Nasal Cannula  O2 Flow Rate (L/min) 6 L/min  MEWS Score  MEWS Temp 0  MEWS Systolic 0  MEWS Pulse 0  MEWS RR 0  MEWS LOC 0  MEWS Score 0  MEWS Score Color Nyoka Cowden
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
## 2022-07-08 DIAGNOSIS — N179 Acute kidney failure, unspecified: Secondary | ICD-10-CM | POA: Diagnosis not present

## 2022-07-08 DIAGNOSIS — I5033 Acute on chronic diastolic (congestive) heart failure: Secondary | ICD-10-CM | POA: Diagnosis not present

## 2022-07-08 DIAGNOSIS — J439 Emphysema, unspecified: Secondary | ICD-10-CM

## 2022-07-08 DIAGNOSIS — I2510 Atherosclerotic heart disease of native coronary artery without angina pectoris: Secondary | ICD-10-CM | POA: Diagnosis not present

## 2022-07-08 DIAGNOSIS — I4892 Unspecified atrial flutter: Secondary | ICD-10-CM | POA: Diagnosis not present

## 2022-07-08 DIAGNOSIS — L89312 Pressure ulcer of right buttock, stage 2: Secondary | ICD-10-CM | POA: Diagnosis present

## 2022-07-08 LAB — BASIC METABOLIC PANEL
Anion gap: 9 (ref 5–15)
BUN: 52 mg/dL — ABNORMAL HIGH (ref 8–23)
CO2: 20 mmol/L — ABNORMAL LOW (ref 22–32)
Calcium: 8.5 mg/dL — ABNORMAL LOW (ref 8.9–10.3)
Chloride: 106 mmol/L (ref 98–111)
Creatinine, Ser: 1.95 mg/dL — ABNORMAL HIGH (ref 0.61–1.24)
GFR, Estimated: 33 mL/min — ABNORMAL LOW (ref >60)
Glucose, Bld: 86 mg/dL (ref 70–99)
Potassium: 4.4 mmol/L (ref 3.5–5.1)
Sodium: 135 mmol/L (ref 135–145)

## 2022-07-08 LAB — CBC
HCT: 29.2 % — ABNORMAL LOW (ref 39.0–52.0)
Hemoglobin: 9.7 g/dL — ABNORMAL LOW (ref 13.0–17.0)
MCH: 32.9 pg (ref 26.0–34.0)
MCHC: 33.2 g/dL (ref 30.0–36.0)
MCV: 99 fL (ref 80.0–100.0)
Platelets: 161 10*3/uL (ref 150–400)
RBC: 2.95 MIL/uL — ABNORMAL LOW (ref 4.22–5.81)
RDW: 15.9 % — ABNORMAL HIGH (ref 11.5–15.5)
WBC: 5.9 10*3/uL (ref 4.0–10.5)
nRBC: 0 % (ref 0.0–0.2)

## 2022-07-08 NOTE — Assessment & Plan Note (Signed)
CKD stage 3a to 3b. Hyperkalemia and hyponatremia.   Renal function today with serum cr at 1,95 with K at 4,4 and serum bicarbonate at 20. Plan to continue diuresis with IV furosemide.

## 2022-07-08 NOTE — Assessment & Plan Note (Signed)
Blood pressure stable, continue blood pressure control with amlodipine.

## 2022-07-08 NOTE — Assessment & Plan Note (Addendum)
Echocardiogram with preserved LV systolic function with EF 60 to 65%, mild LVH, RV systolic function is preserved. RVSP 45,7 left atrium with severe dilatation, moderate to severe TR.   Patient was placed on IV furosemide for diuresis, negative fluid balance was achieved, - 5,525 ml since admission, with significant improvement in his symptoms.   Plan to continue diuresis with furosemide at 40 mg daily and instructions to increase to 40 mg bid in case of volume overload.  Limited pharmacology options due to low GFR.   Acute hypoxemic respiratory failure due to cardiogenic pulmonary edema, improved with diuresis.  Patient required non invasive mechanical ventilation on admission, at the time of his discharge his 02 saturation is 96% on room air.

## 2022-07-08 NOTE — Assessment & Plan Note (Signed)
Patient very weak and deconditioned Patient will continue outpatient PT and OT.

## 2022-07-08 NOTE — Assessment & Plan Note (Signed)
Present on admission, will consult wound care team  Continue to encourage mobility and consult nutrition
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
## 2022-07-08 NOTE — Progress Notes (Addendum)
Heart rate drop to 38,patient was on rest.heart rate went up to 48 when he awake.patient is asymptomatic MD notified. Care continues

## 2022-07-08 NOTE — Assessment & Plan Note (Signed)
Continue with levothyroxine  
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
## 2022-07-08 NOTE — Assessment & Plan Note (Signed)
hgb has been stable at 9,7.  Follow up hgb as outpatient.

## 2022-07-08 NOTE — Assessment & Plan Note (Addendum)
Persistent atrial flutter, continue rate control with amiodarone. Patient on clopidogrel and rivaroxaban.

## 2022-07-08 NOTE — Assessment & Plan Note (Signed)
No chest pain, continue with clopidogrel.

## 2022-07-08 NOTE — Assessment & Plan Note (Signed)
No clinical signs of exacerbation  
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
## 2022-07-08 NOTE — Progress Notes (Signed)
No Respiratory distress noted. No BIPAP needed at this time. RT and RN will continue to monitor.
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
## 2022-07-09 ENCOUNTER — Other Ambulatory Visit (HOSPITAL_COMMUNITY): Payer: Self-pay

## 2022-07-09 DIAGNOSIS — I5033 Acute on chronic diastolic (congestive) heart failure: Secondary | ICD-10-CM | POA: Diagnosis not present

## 2022-07-09 DIAGNOSIS — I4892 Unspecified atrial flutter: Secondary | ICD-10-CM | POA: Diagnosis not present

## 2022-07-09 DIAGNOSIS — N179 Acute kidney failure, unspecified: Secondary | ICD-10-CM | POA: Diagnosis not present

## 2022-07-09 DIAGNOSIS — I2510 Atherosclerotic heart disease of native coronary artery without angina pectoris: Secondary | ICD-10-CM | POA: Diagnosis not present

## 2022-07-09 LAB — BASIC METABOLIC PANEL
Anion gap: 11 (ref 5–15)
BUN: 46 mg/dL — ABNORMAL HIGH (ref 8–23)
CO2: 22 mmol/L (ref 22–32)
Calcium: 8.7 mg/dL — ABNORMAL LOW (ref 8.9–10.3)
Chloride: 105 mmol/L (ref 98–111)
Creatinine, Ser: 1.65 mg/dL — ABNORMAL HIGH (ref 0.61–1.24)
GFR, Estimated: 40 mL/min — ABNORMAL LOW (ref >60)
Glucose, Bld: 85 mg/dL (ref 70–99)
Potassium: 4 mmol/L (ref 3.5–5.1)
Sodium: 138 mmol/L (ref 135–145)

## 2022-07-09 LAB — UREA NITROGEN, URINE: Urea Nitrogen, Ur: 349 mg/dL

## 2022-07-09 LAB — MAGNESIUM: Magnesium: 2 mg/dL (ref 1.7–2.4)

## 2022-07-09 MED ORDER — FUROSEMIDE 40 MG PO TABS
40.0000 mg | ORAL_TABLET | Freq: Every day | ORAL | 0 refills | Status: DC
  Filled 2022-07-09: qty 30, 30d supply, fill #0

## 2022-07-09 MED ORDER — FUROSEMIDE 40 MG PO TABS
40.0000 mg | ORAL_TABLET | Freq: Every day | ORAL | Status: DC
  Administered 2022-07-09: 40 mg via ORAL
  Filled 2022-07-09: qty 1
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
## 2022-07-09 NOTE — Progress Notes (Signed)
Heart Failure Navigator Progress Note  Assessed for Heart & Vascular TOC clinic readiness.  Patient very deconditioned, spoke with hospitalist, agreeable to not add TOC at this time d/t multiple problems. Renal adjusting diuretic dosing. Had cardiology follow up with Dr. Martinique 12/7.  Navigator available for reassessment of patient.    Pricilla Holm, MSN, RN Heart Failure Nurse Navigator

## 2022-07-09 NOTE — Progress Notes (Signed)
Mobility Specialist Progress Note:   07/09/22 0924  Mobility  Activity Ambulated with assistance in hallway  Activity Response Tolerated well  Distance Ambulated (ft) 150 ft  $Mobility charge 1 Mobility  Level of Assistance Standby assist, set-up cues, supervision of patient - no hands on  Assistive Device Front wheel walker   Pt received in bed willing to participate in mobility. No complaints of pain. Left in chair with call bell in reach and all needs met.   [employer] Surveyor, mining Chat only
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
## 2022-07-09 NOTE — Plan of Care (Signed)
DISCHARGE NOTE HOME Marcus A Speagle Sr. to be discharged back to his ALF per MD order. Discussed prescriptions and follow up appointments with the patient. Prescriptions given to patient; medication list explained in detail. Patient verbalized understanding.   IV catheter discontinued intact. Site without signs and symptoms of complications. Dressing and pressure applied. Pt denies pain at the site currently. No complaints noted.  Patient free of lines, drains, and wounds.   An After Visit Summary (AVS) was printed and given to the patient. Patient escorted via wheelchair, and discharged to ALF via private auto.  Marcus Minister, RN

## 2022-07-09 NOTE — TOC Transition Note (Signed)
Transition of Care [facility]) - CM/SW Discharge Note   Patient Details  Name: Marcus KOREN Sr. MRN: [id number] Date of Birth: March 31, 1932  Transition of Care [facility]) CM/SW Contact:  Vinie Sill, LCSW Phone Number: 07/09/2022, 2:05 PM   Clinical Narrative:     Patient will Discharge to: [facility] Discharge Date: 07/09/2022 Family Notified: daughter Transport ER:XVQM  Per MD patient is ready for discharge. RN, patient, and facility notified of discharge. Discharge Summary sent to facility. RN given number for [phone].    Clinical Social Worker signing off.  Thurmond Butts, MSW, LCSW Clinical Social Worker     Final next level of care: Assisted Living Barriers to Discharge: Barriers Resolved   Patient Goals and CMS Choice        Discharge Placement                       Discharge Plan and Services In-house Referral: Clinical Social Work                                   Social Determinants of Health (SDOH) Interventions     Readmission Risk Interventions     No data to display
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
## 2022-07-10 DIAGNOSIS — R2689 Other abnormalities of gait and mobility: Secondary | ICD-10-CM | POA: Diagnosis not present

## 2022-07-10 DIAGNOSIS — M6281 Muscle weakness (generalized): Secondary | ICD-10-CM | POA: Diagnosis not present

## 2022-07-11 DIAGNOSIS — Z7901 Long term (current) use of anticoagulants: Secondary | ICD-10-CM | POA: Diagnosis not present

## 2022-07-11 DIAGNOSIS — S2242XD Multiple fractures of ribs, left side, subsequent encounter for fracture with routine healing: Secondary | ICD-10-CM | POA: Diagnosis not present

## 2022-07-11 DIAGNOSIS — I130 Hypertensive heart and chronic kidney disease with heart failure and stage 1 through stage 4 chronic kidney disease, or unspecified chronic kidney disease: Secondary | ICD-10-CM | POA: Diagnosis not present

## 2022-07-11 DIAGNOSIS — E785 Hyperlipidemia, unspecified: Secondary | ICD-10-CM | POA: Diagnosis not present

## 2022-07-11 DIAGNOSIS — I482 Chronic atrial fibrillation, unspecified: Secondary | ICD-10-CM | POA: Diagnosis not present

## 2022-07-11 DIAGNOSIS — J439 Emphysema, unspecified: Secondary | ICD-10-CM | POA: Diagnosis not present

## 2022-07-11 DIAGNOSIS — I509 Heart failure, unspecified: Secondary | ICD-10-CM | POA: Diagnosis not present

## 2022-07-11 DIAGNOSIS — D649 Anemia, unspecified: Secondary | ICD-10-CM | POA: Diagnosis not present

## 2022-07-11 DIAGNOSIS — L89152 Pressure ulcer of sacral region, stage 2: Secondary | ICD-10-CM | POA: Diagnosis not present

## 2022-07-11 DIAGNOSIS — I4892 Unspecified atrial flutter: Secondary | ICD-10-CM | POA: Diagnosis not present

## 2022-07-11 DIAGNOSIS — N1832 Chronic kidney disease, stage 3b: Secondary | ICD-10-CM | POA: Diagnosis not present

## 2022-07-11 DIAGNOSIS — I714 Abdominal aortic aneurysm, without rupture, unspecified: Secondary | ICD-10-CM | POA: Diagnosis not present

## 2022-07-11 DIAGNOSIS — S066X0D Traumatic subarachnoid hemorrhage without loss of consciousness, subsequent encounter: Secondary | ICD-10-CM | POA: Diagnosis not present

## 2022-07-11 DIAGNOSIS — I5030 Unspecified diastolic (congestive) heart failure: Secondary | ICD-10-CM | POA: Diagnosis not present

## 2022-07-11 DIAGNOSIS — Z9181 History of falling: Secondary | ICD-10-CM | POA: Diagnosis not present

## 2022-07-11 DIAGNOSIS — I2510 Atherosclerotic heart disease of native coronary artery without angina pectoris: Secondary | ICD-10-CM | POA: Diagnosis not present

## 2022-07-11 DIAGNOSIS — E039 Hypothyroidism, unspecified: Secondary | ICD-10-CM | POA: Diagnosis not present

## 2022-07-11 NOTE — Patient Outreach (Signed)
[facility] CM Inpatient Consult   07/11/2022  Marcus HAYHURST Sr. [birth year] [id number]  [facility] [ACO] Patient: Medicare ACO REACH  Referral request:  [facility] Readmission report  Primary Care Provider:  [name], MD, [facility]  Patient did transition to a  his [place] at [employer] at [facility] and to be followed by [employer] for [facility] needs.  Plan:  Call attempted to check for post hospital care coordination needs, was able to leave a HIPPA appropriate voice mail message to number provided in electronic medical record. Will await a return call for follow up needs. Updates sent to [facility] Readmission report per request.  Marcus Brood, RN BSN [place]  [phone] business mobile phone Toll free office [phone]  *[place]  [phone] Fax number: [phone] [email or web address]

## 2022-07-13 DIAGNOSIS — I700 Atherosclerosis of aorta: Secondary | ICD-10-CM | POA: Diagnosis not present

## 2022-07-13 DIAGNOSIS — Z23 Encounter for immunization: Secondary | ICD-10-CM | POA: Diagnosis not present

## 2022-07-13 DIAGNOSIS — J439 Emphysema, unspecified: Secondary | ICD-10-CM | POA: Diagnosis not present

## 2022-07-13 DIAGNOSIS — I4892 Unspecified atrial flutter: Secondary | ICD-10-CM | POA: Diagnosis not present

## 2022-07-13 DIAGNOSIS — I2510 Atherosclerotic heart disease of native coronary artery without angina pectoris: Secondary | ICD-10-CM | POA: Diagnosis not present

## 2022-07-13 DIAGNOSIS — I129 Hypertensive chronic kidney disease with stage 1 through stage 4 chronic kidney disease, or unspecified chronic kidney disease: Secondary | ICD-10-CM | POA: Diagnosis not present

## 2022-07-13 DIAGNOSIS — N1831 Chronic kidney disease, stage 3a: Secondary | ICD-10-CM | POA: Diagnosis not present

## 2022-07-13 DIAGNOSIS — I5031 Acute diastolic (congestive) heart failure: Secondary | ICD-10-CM | POA: Diagnosis not present

## 2022-07-13 DIAGNOSIS — L98429 Non-pressure chronic ulcer of back with unspecified severity: Secondary | ICD-10-CM | POA: Diagnosis not present

## 2022-07-13 DIAGNOSIS — J90 Pleural effusion, not elsewhere classified: Secondary | ICD-10-CM | POA: Diagnosis not present

## 2022-07-13 DIAGNOSIS — J9601 Acute respiratory failure with hypoxia: Secondary | ICD-10-CM | POA: Diagnosis not present

## 2022-07-13 DIAGNOSIS — E039 Hypothyroidism, unspecified: Secondary | ICD-10-CM | POA: Diagnosis not present

## 2022-07-16 ENCOUNTER — Telehealth: Payer: Self-pay | Admitting: *Deleted

## 2022-07-16 DIAGNOSIS — N1831 Chronic kidney disease, stage 3a: Secondary | ICD-10-CM | POA: Diagnosis not present

## 2022-07-16 DIAGNOSIS — E039 Hypothyroidism, unspecified: Secondary | ICD-10-CM | POA: Diagnosis not present

## 2022-07-16 DIAGNOSIS — I129 Hypertensive chronic kidney disease with stage 1 through stage 4 chronic kidney disease, or unspecified chronic kidney disease: Secondary | ICD-10-CM | POA: Diagnosis not present

## 2022-07-16 DIAGNOSIS — J90 Pleural effusion, not elsewhere classified: Secondary | ICD-10-CM | POA: Diagnosis not present

## 2022-07-16 DIAGNOSIS — J439 Emphysema, unspecified: Secondary | ICD-10-CM | POA: Diagnosis not present

## 2022-07-16 DIAGNOSIS — I5031 Acute diastolic (congestive) heart failure: Secondary | ICD-10-CM | POA: Diagnosis not present

## 2022-07-16 DIAGNOSIS — F418 Other specified anxiety disorders: Secondary | ICD-10-CM | POA: Diagnosis not present

## 2022-07-16 DIAGNOSIS — L98429 Non-pressure chronic ulcer of back with unspecified severity: Secondary | ICD-10-CM | POA: Diagnosis not present

## 2022-07-16 NOTE — Telephone Encounter (Signed)
Patient would like to remove monitor today in order to get results faster.  Mr. Marcus [name] is on day 10 out of 3.  Explained if he could hold on a couple of days it would be better, but remove if your skin is irritated, or it falls off, mail back to [facility] for processing.

## 2022-07-16 NOTE — Telephone Encounter (Signed)
New Message:     Please call, patient is wearing a monitor. He says he have some questions.

## 2022-07-17 ENCOUNTER — Ambulatory Visit: Payer: Medicare Other | Attending: Physician Assistant | Admitting: Physician Assistant

## 2022-07-17 ENCOUNTER — Encounter: Payer: Self-pay | Admitting: Physician Assistant

## 2022-07-17 VITALS — BP 102/60 | HR 54 | Ht 68.0 in | Wt 159.8 lb

## 2022-07-17 DIAGNOSIS — I484 Atypical atrial flutter: Secondary | ICD-10-CM | POA: Diagnosis not present

## 2022-07-17 DIAGNOSIS — I483 Typical atrial flutter: Secondary | ICD-10-CM | POA: Diagnosis not present

## 2022-07-17 DIAGNOSIS — I5033 Acute on chronic diastolic (congestive) heart failure: Secondary | ICD-10-CM | POA: Diagnosis not present

## 2022-07-17 DIAGNOSIS — N1832 Chronic kidney disease, stage 3b: Secondary | ICD-10-CM | POA: Insufficient documentation

## 2022-07-17 DIAGNOSIS — I2510 Atherosclerotic heart disease of native coronary artery without angina pectoris: Secondary | ICD-10-CM

## 2022-07-17 NOTE — Patient Instructions (Addendum)
Medication Instructions:  Your physician recommends that you continue on your current medications as directed. Please refer to the Current Medication list given to you today. *If you need a refill on your cardiac medications before your next appointment, please call your pharmacy*   Lab Work: TODAY-BMET & BNP If you have labs (blood work) drawn today and your tests are completely normal, you will receive your results only by: [place] (if you have MyChart) OR A paper copy in the mail If you have any lab test that is abnormal or we need to change your treatment, we will call you to review the results.   Testing/Procedures: None Ordered   Follow-Up: At [facility], you and your health needs are our priority.  As part of our continuing mission to provide you with exceptional heart care, we have created designated Provider Care Teams.  These Care Teams include your primary Cardiologist (physician) and Advanced Practice Providers (APPs -  Physician Assistants and Nurse Practitioners) who all work together to provide you with the care you need, when you need it.  We recommend signing up for the patient portal called "MyChart".  Sign up information is provided on this After Visit Summary.  MyChart is used to connect with patients for Virtual Visits (Telemedicine).  Patients are able to view lab/test results, encounter notes, upcoming appointments, etc.  Non-urgent messages can be sent to your provider as well.   To learn more about what you can do with MyChart, go to [email or web address].    Your next appointment:   2-3 week(s)  The format for your next appointment:   In Person  Provider:   You will follow up in the [facility] located at [facility]. Your provider will be: Roderic Palau, NP or Clint R. Fenton, PA-C    Other Instructions   Important Information About Sugar

## 2022-07-17 NOTE — Addendum Note (Signed)
Addended by: Ulice Brilliant T on: 07/17/2022 03:22 PM   Modules accepted: Orders
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
## 2022-07-18 ENCOUNTER — Telehealth: Payer: Self-pay | Admitting: Cardiology

## 2022-07-18 DIAGNOSIS — I130 Hypertensive heart and chronic kidney disease with heart failure and stage 1 through stage 4 chronic kidney disease, or unspecified chronic kidney disease: Secondary | ICD-10-CM | POA: Diagnosis not present

## 2022-07-18 DIAGNOSIS — N1832 Chronic kidney disease, stage 3b: Secondary | ICD-10-CM | POA: Diagnosis not present

## 2022-07-18 DIAGNOSIS — L89152 Pressure ulcer of sacral region, stage 2: Secondary | ICD-10-CM | POA: Diagnosis not present

## 2022-07-18 DIAGNOSIS — I5030 Unspecified diastolic (congestive) heart failure: Secondary | ICD-10-CM | POA: Diagnosis not present

## 2022-07-18 DIAGNOSIS — I482 Chronic atrial fibrillation, unspecified: Secondary | ICD-10-CM | POA: Diagnosis not present

## 2022-07-18 DIAGNOSIS — S2242XD Multiple fractures of ribs, left side, subsequent encounter for fracture with routine healing: Secondary | ICD-10-CM | POA: Diagnosis not present

## 2022-07-18 LAB — BASIC METABOLIC PANEL
BUN/Creatinine Ratio: 34 — ABNORMAL HIGH (ref 10–24)
BUN: 60 mg/dL — ABNORMAL HIGH (ref 8–27)
CO2: 21 mmol/L (ref 20–29)
Calcium: 8.8 mg/dL (ref 8.6–10.2)
Chloride: 105 mmol/L (ref 96–106)
Creatinine, Ser: 1.78 mg/dL — ABNORMAL HIGH (ref 0.76–1.27)
Glucose: 101 mg/dL — ABNORMAL HIGH (ref 70–99)
Potassium: 4.4 mmol/L (ref 3.5–5.2)
Sodium: 142 mmol/L (ref 134–144)
eGFR: 36 mL/min/{1.73_m2} — ABNORMAL LOW (ref >59)

## 2022-07-18 LAB — PRO B NATRIURETIC PEPTIDE: NT-Pro BNP: 5756 pg/mL — ABNORMAL HIGH (ref 0–486)

## 2022-07-18 NOTE — Telephone Encounter (Signed)
Pt would like a callback from nurse regarding Heart Monitor. Please advise

## 2022-07-18 NOTE — Telephone Encounter (Signed)
Patient requesting to remove his monitor a day early as he is very anxious about the results. Advised him that if he could wait full 14 days that would be great but if he would like to send it back early we should still have enough information from it. He is aware that we will call him once results are received and reviewed by the provider.

## 2022-07-19 ENCOUNTER — Other Ambulatory Visit: Payer: Self-pay

## 2022-07-19 DIAGNOSIS — I5042 Chronic combined systolic (congestive) and diastolic (congestive) heart failure: Secondary | ICD-10-CM

## 2022-07-19 DIAGNOSIS — I483 Typical atrial flutter: Secondary | ICD-10-CM

## 2022-07-19 MED ORDER — FUROSEMIDE 80 MG PO TABS: 80.0000 mg | ORAL_TABLET | Freq: Every day | ORAL | 0 refills | Status: DC

## 2022-07-20 DIAGNOSIS — L98429 Non-pressure chronic ulcer of back with unspecified severity: Secondary | ICD-10-CM | POA: Diagnosis not present

## 2022-07-20 DIAGNOSIS — I129 Hypertensive chronic kidney disease with stage 1 through stage 4 chronic kidney disease, or unspecified chronic kidney disease: Secondary | ICD-10-CM | POA: Diagnosis not present

## 2022-07-20 DIAGNOSIS — I5031 Acute diastolic (congestive) heart failure: Secondary | ICD-10-CM | POA: Diagnosis not present

## 2022-07-20 DIAGNOSIS — J90 Pleural effusion, not elsewhere classified: Secondary | ICD-10-CM | POA: Diagnosis not present

## 2022-07-20 DIAGNOSIS — N1831 Chronic kidney disease, stage 3a: Secondary | ICD-10-CM | POA: Diagnosis not present

## 2022-07-20 DIAGNOSIS — E039 Hypothyroidism, unspecified: Secondary | ICD-10-CM | POA: Diagnosis not present

## 2022-07-20 DIAGNOSIS — F418 Other specified anxiety disorders: Secondary | ICD-10-CM | POA: Diagnosis not present

## 2022-07-20 DIAGNOSIS — J439 Emphysema, unspecified: Secondary | ICD-10-CM | POA: Diagnosis not present

## 2022-07-22 DIAGNOSIS — Z23 Encounter for immunization: Secondary | ICD-10-CM | POA: Diagnosis not present

## 2022-07-24 ENCOUNTER — Encounter: Payer: Self-pay | Admitting: Cardiology

## 2022-07-24 DIAGNOSIS — L89152 Pressure ulcer of sacral region, stage 2: Secondary | ICD-10-CM | POA: Diagnosis not present

## 2022-07-24 DIAGNOSIS — N1832 Chronic kidney disease, stage 3b: Secondary | ICD-10-CM | POA: Diagnosis not present

## 2022-07-24 DIAGNOSIS — I130 Hypertensive heart and chronic kidney disease with heart failure and stage 1 through stage 4 chronic kidney disease, or unspecified chronic kidney disease: Secondary | ICD-10-CM | POA: Diagnosis not present

## 2022-07-24 DIAGNOSIS — I482 Chronic atrial fibrillation, unspecified: Secondary | ICD-10-CM | POA: Diagnosis not present

## 2022-07-24 DIAGNOSIS — I5030 Unspecified diastolic (congestive) heart failure: Secondary | ICD-10-CM | POA: Diagnosis not present

## 2022-07-24 DIAGNOSIS — S2242XD Multiple fractures of ribs, left side, subsequent encounter for fracture with routine healing: Secondary | ICD-10-CM | POA: Diagnosis not present

## 2022-07-25 ENCOUNTER — Ambulatory Visit: Payer: Medicare Other | Admitting: Podiatry
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
## 2022-07-26 ENCOUNTER — Ambulatory Visit: Payer: Medicare Other | Attending: Physician Assistant | Admitting: Physician Assistant

## 2022-07-26 ENCOUNTER — Encounter: Payer: Self-pay | Admitting: Physician Assistant

## 2022-07-26 VITALS — BP 104/58 | HR 48 | Ht 68.0 in | Wt 161.0 lb

## 2022-07-26 DIAGNOSIS — N1832 Chronic kidney disease, stage 3b: Secondary | ICD-10-CM | POA: Diagnosis not present

## 2022-07-26 DIAGNOSIS — I130 Hypertensive heart and chronic kidney disease with heart failure and stage 1 through stage 4 chronic kidney disease, or unspecified chronic kidney disease: Secondary | ICD-10-CM | POA: Diagnosis not present

## 2022-07-26 DIAGNOSIS — E7800 Pure hypercholesterolemia, unspecified: Secondary | ICD-10-CM

## 2022-07-26 DIAGNOSIS — I484 Atypical atrial flutter: Secondary | ICD-10-CM | POA: Diagnosis not present

## 2022-07-26 DIAGNOSIS — I2510 Atherosclerotic heart disease of native coronary artery without angina pectoris: Secondary | ICD-10-CM | POA: Diagnosis not present

## 2022-07-26 DIAGNOSIS — I482 Chronic atrial fibrillation, unspecified: Secondary | ICD-10-CM | POA: Diagnosis not present

## 2022-07-26 DIAGNOSIS — I5033 Acute on chronic diastolic (congestive) heart failure: Secondary | ICD-10-CM

## 2022-07-26 DIAGNOSIS — R0609 Other forms of dyspnea: Secondary | ICD-10-CM | POA: Diagnosis not present

## 2022-07-26 DIAGNOSIS — I4891 Unspecified atrial fibrillation: Secondary | ICD-10-CM

## 2022-07-26 DIAGNOSIS — I5030 Unspecified diastolic (congestive) heart failure: Secondary | ICD-10-CM | POA: Diagnosis not present

## 2022-07-26 DIAGNOSIS — L89152 Pressure ulcer of sacral region, stage 2: Secondary | ICD-10-CM | POA: Diagnosis not present

## 2022-07-26 DIAGNOSIS — Z951 Presence of aortocoronary bypass graft: Secondary | ICD-10-CM | POA: Diagnosis not present

## 2022-07-26 DIAGNOSIS — I5041 Acute combined systolic (congestive) and diastolic (congestive) heart failure: Secondary | ICD-10-CM | POA: Diagnosis not present

## 2022-07-26 DIAGNOSIS — S2242XD Multiple fractures of ribs, left side, subsequent encounter for fracture with routine healing: Secondary | ICD-10-CM | POA: Diagnosis not present

## 2022-07-26 MED ORDER — TORSEMIDE 20 MG PO TABS: 20.0000 mg | ORAL_TABLET | Freq: Two times a day (BID) | ORAL | 3 refills | Status: DC

## 2022-07-26 MED ORDER — AMIODARONE HCL 200 MG PO TABS: 200.0000 mg | ORAL_TABLET | Freq: Two times a day (BID) | ORAL | 3 refills | Status: DC

## 2022-07-26 NOTE — Patient Instructions (Addendum)
Medication Instructions:  Stop Lasix  Start Torsemide 20 mg ( Take 1 Tablet Twice Daily). Increase Amiodarone 200 mg ( Take 1 tablet Twice Daily). *If you need a refill on your cardiac medications before your next appointment, please call your pharmacy*   Lab Work: BMET, BNP, CBC If you have labs (blood work) drawn today and your tests are completely normal, you will receive your results only by: [place] (if you have MyChart) OR A paper copy in the mail If you have any lab test that is abnormal or we need to change your treatment, we will call you to review the results.   Testing/Procedures:     Dear Marcus Kaufmann Sr.  You are scheduled for a Cardioversion on Thursday, November 9 with Dr. Gasper Sells.  Please arrive at the [facility] (Main Entrance A) at [facility]: [address] [place], [place] [address] at 8:30 AM.   DIET:  Nothing to eat or drink after midnight except a sip of water with medications (see medication instructions below)  MEDICATION INSTRUCTIONS:  Continue taking your anticoagulant (blood thinner): Rivaroxaban (Xarelto).  You will need to continue this after your procedure until you are told by your provider that it is safe to stop.    LABS:   Come to [facility], [address].  FYI:  For your safety, and to allow [place] to monitor your vital signs accurately during the surgery/procedure we request: If you have artificial nails, gel coating, SNS etc, please have those removed prior to your surgery/procedure. Not having the nail coverings /polish removed may result in cancellation or delay of your surgery/procedure.  You must have a responsible person to drive you home and stay in the waiting area during your procedure. Failure to do so could result in cancellation.  Bring your insurance cards.  *Special Note: Every effort is made to have your procedure done on time. Occasionally there are emergencies that occur at the  hospital that may cause delays. Please be patient if a delay does occur.       Follow-Up: At [facility], you and your health needs are our priority.  As part of our continuing mission to provide you with exceptional heart care, we have created designated Provider Care Teams.  These Care Teams include your primary Cardiologist (physician) and Advanced Practice Providers (APPs -  Physician Assistants and Nurse Practitioners) who all work together to provide you with the care you need, when you need it.  We recommend signing up for the patient portal called "MyChart".  Sign up information is provided on this After Visit Summary.  MyChart is used to connect with patients for Virtual Visits (Telemedicine).  Patients are able to view lab/test results, encounter notes, upcoming appointments, etc.  Non-urgent messages can be sent to your provider as well.   To learn more about what you can do with MyChart, go to [email or web address].    Your next appointment:   2-3week(s)  The format for your next appointment:   In Person  Provider:   Fabian Sharp, PA-C

## 2022-07-26 NOTE — Telephone Encounter (Signed)
Pt seen today in clinic

## 2022-07-27 ENCOUNTER — Telehealth: Payer: Self-pay | Admitting: Cardiology

## 2022-07-27 ENCOUNTER — Other Ambulatory Visit: Payer: Self-pay

## 2022-07-27 ENCOUNTER — Telehealth: Payer: Self-pay | Admitting: Physician Assistant

## 2022-07-27 DIAGNOSIS — L89152 Pressure ulcer of sacral region, stage 2: Secondary | ICD-10-CM | POA: Diagnosis not present

## 2022-07-27 DIAGNOSIS — S2242XD Multiple fractures of ribs, left side, subsequent encounter for fracture with routine healing: Secondary | ICD-10-CM | POA: Diagnosis not present

## 2022-07-27 DIAGNOSIS — Z79899 Other long term (current) drug therapy: Secondary | ICD-10-CM

## 2022-07-27 DIAGNOSIS — I5030 Unspecified diastolic (congestive) heart failure: Secondary | ICD-10-CM | POA: Diagnosis not present

## 2022-07-27 DIAGNOSIS — I482 Chronic atrial fibrillation, unspecified: Secondary | ICD-10-CM | POA: Diagnosis not present

## 2022-07-27 DIAGNOSIS — I130 Hypertensive heart and chronic kidney disease with heart failure and stage 1 through stage 4 chronic kidney disease, or unspecified chronic kidney disease: Secondary | ICD-10-CM | POA: Diagnosis not present

## 2022-07-27 DIAGNOSIS — N1832 Chronic kidney disease, stage 3b: Secondary | ICD-10-CM | POA: Diagnosis not present

## 2022-07-27 LAB — CBC
Hematocrit: 32.1 % — ABNORMAL LOW (ref 37.5–51.0)
Hemoglobin: 10.6 g/dL — ABNORMAL LOW (ref 13.0–17.7)
MCH: 31.7 pg (ref 26.6–33.0)
MCHC: 33 g/dL (ref 31.5–35.7)
MCV: 96 fL (ref 79–97)
Platelets: 207 10*3/uL (ref 150–450)
RBC: 3.34 x10E6/uL — ABNORMAL LOW (ref 4.14–5.80)
RDW: 15.3 % (ref 11.6–15.4)
WBC: 6.9 10*3/uL (ref 3.4–10.8)

## 2022-07-27 LAB — BASIC METABOLIC PANEL
BUN/Creatinine Ratio: 28 — ABNORMAL HIGH (ref 10–24)
BUN: 56 mg/dL — ABNORMAL HIGH (ref 8–27)
CO2: 22 mmol/L (ref 20–29)
Calcium: 8.7 mg/dL (ref 8.6–10.2)
Chloride: 106 mmol/L (ref 96–106)
Creatinine, Ser: 1.97 mg/dL — ABNORMAL HIGH (ref 0.76–1.27)
Glucose: 89 mg/dL (ref 70–99)
Potassium: 5.3 mmol/L — ABNORMAL HIGH (ref 3.5–5.2)
Sodium: 142 mmol/L (ref 134–144)
eGFR: 32 mL/min/{1.73_m2} — ABNORMAL LOW (ref >59)

## 2022-07-27 LAB — BRAIN NATRIURETIC PEPTIDE: BNP: 546.7 pg/mL — ABNORMAL HIGH (ref 0.0–100.0)

## 2022-07-27 MED ORDER — TORSEMIDE 20 MG PO TABS: 20.0000 mg | ORAL_TABLET | Freq: Two times a day (BID) | ORAL | 3 refills | Status: DC

## 2022-07-27 NOTE — Telephone Encounter (Signed)
[name] from [facility] states they received fax, but it was only the cover letter and nothing else. Requesting for this to be faxed again.

## 2022-07-27 NOTE — Telephone Encounter (Signed)
Faxing again.  Thanks!

## 2022-07-27 NOTE — Addendum Note (Signed)
Addended by: Betha Loa F on: 07/27/2022 12:48 PM   Modules accepted: Orders

## 2022-07-27 NOTE — Telephone Encounter (Signed)
Pt is returning call in regards to results. Requesting call back.  

## 2022-07-27 NOTE — Telephone Encounter (Signed)
Marcus [name] is returning RN's call regarding changes made due to patient's lab results. She is requesting the changes be faxed over to [phone]. She also gave a secondary fax if the first does not go through, [phone]. Please advise.

## 2022-07-27 NOTE — Telephone Encounter (Signed)
LMTCB

## 2022-07-27 NOTE — Telephone Encounter (Signed)
Spoke wit Software engineer from [facility]. She wanted a fax for medication and lab orders. Switch from lasix to torsemide '20mg'$  twice daily, stop spironolactone, BMP in 5-7 days. To [phone] and to [phone].

## 2022-07-27 NOTE — Telephone Encounter (Signed)
Pt returning call

## 2022-07-27 NOTE — Telephone Encounter (Addendum)
Spoke with ex-spouse and gave her recommendations per A. Duke, PA regarding medications and lab work. Informed her I was faxing the information to [facility]. At 1:27, faxed lab and medication orders, as well as a note page from office visit.

## 2022-07-27 NOTE — Telephone Encounter (Signed)
Fax of med orders, lab order, and OV note received confirmation to [phone] at [facility].

## 2022-07-27 NOTE — Telephone Encounter (Signed)
New Message:      Jolyne Loa said the fax that Sarah  sent over, only 1 page came through. It was the [employer].. Please have her  refax to [phone].

## 2022-07-30 ENCOUNTER — Encounter: Payer: Self-pay | Admitting: Cardiology

## 2022-07-31 NOTE — Telephone Encounter (Signed)
Pt aware HRs normal, lowest were nocturnal. Aware O2 sats normal. Aware that a PPM is not warranted at this time.  Explained that first step would be the approach he is on with taking Amiodarone and DCCV scheduled for next week. Aware I would still forward to MD in case he has other advisement. Aware I will only call back if MD has different advisement. Patient verbalized understanding and agreeable to plan.

## 2022-08-01 DIAGNOSIS — I5030 Unspecified diastolic (congestive) heart failure: Secondary | ICD-10-CM | POA: Diagnosis not present

## 2022-08-01 DIAGNOSIS — N1832 Chronic kidney disease, stage 3b: Secondary | ICD-10-CM | POA: Diagnosis not present

## 2022-08-01 DIAGNOSIS — L89152 Pressure ulcer of sacral region, stage 2: Secondary | ICD-10-CM | POA: Diagnosis not present

## 2022-08-01 DIAGNOSIS — I482 Chronic atrial fibrillation, unspecified: Secondary | ICD-10-CM | POA: Diagnosis not present

## 2022-08-01 DIAGNOSIS — I130 Hypertensive heart and chronic kidney disease with heart failure and stage 1 through stage 4 chronic kidney disease, or unspecified chronic kidney disease: Secondary | ICD-10-CM | POA: Diagnosis not present

## 2022-08-01 DIAGNOSIS — S2242XD Multiple fractures of ribs, left side, subsequent encounter for fracture with routine healing: Secondary | ICD-10-CM | POA: Diagnosis not present

## 2022-08-02 DIAGNOSIS — I130 Hypertensive heart and chronic kidney disease with heart failure and stage 1 through stage 4 chronic kidney disease, or unspecified chronic kidney disease: Secondary | ICD-10-CM | POA: Diagnosis not present

## 2022-08-02 DIAGNOSIS — I482 Chronic atrial fibrillation, unspecified: Secondary | ICD-10-CM | POA: Diagnosis not present

## 2022-08-02 DIAGNOSIS — S2242XD Multiple fractures of ribs, left side, subsequent encounter for fracture with routine healing: Secondary | ICD-10-CM | POA: Diagnosis not present

## 2022-08-02 DIAGNOSIS — I5030 Unspecified diastolic (congestive) heart failure: Secondary | ICD-10-CM | POA: Diagnosis not present

## 2022-08-02 DIAGNOSIS — N1832 Chronic kidney disease, stage 3b: Secondary | ICD-10-CM | POA: Diagnosis not present

## 2022-08-02 DIAGNOSIS — L89152 Pressure ulcer of sacral region, stage 2: Secondary | ICD-10-CM | POA: Diagnosis not present

## 2022-08-03 ENCOUNTER — Other Ambulatory Visit: Payer: Self-pay

## 2022-08-03 DIAGNOSIS — I5030 Unspecified diastolic (congestive) heart failure: Secondary | ICD-10-CM | POA: Diagnosis not present

## 2022-08-03 DIAGNOSIS — H5789 Other specified disorders of eye and adnexa: Secondary | ICD-10-CM | POA: Diagnosis not present

## 2022-08-03 DIAGNOSIS — I129 Hypertensive chronic kidney disease with stage 1 through stage 4 chronic kidney disease, or unspecified chronic kidney disease: Secondary | ICD-10-CM | POA: Diagnosis not present

## 2022-08-03 DIAGNOSIS — J90 Pleural effusion, not elsewhere classified: Secondary | ICD-10-CM | POA: Diagnosis not present

## 2022-08-03 DIAGNOSIS — L98429 Non-pressure chronic ulcer of back with unspecified severity: Secondary | ICD-10-CM | POA: Diagnosis not present

## 2022-08-03 DIAGNOSIS — F418 Other specified anxiety disorders: Secondary | ICD-10-CM | POA: Diagnosis not present

## 2022-08-03 DIAGNOSIS — I5031 Acute diastolic (congestive) heart failure: Secondary | ICD-10-CM | POA: Diagnosis not present

## 2022-08-03 DIAGNOSIS — S2242XD Multiple fractures of ribs, left side, subsequent encounter for fracture with routine healing: Secondary | ICD-10-CM | POA: Diagnosis not present

## 2022-08-03 DIAGNOSIS — I130 Hypertensive heart and chronic kidney disease with heart failure and stage 1 through stage 4 chronic kidney disease, or unspecified chronic kidney disease: Secondary | ICD-10-CM | POA: Diagnosis not present

## 2022-08-03 DIAGNOSIS — H109 Unspecified conjunctivitis: Secondary | ICD-10-CM | POA: Diagnosis not present

## 2022-08-03 DIAGNOSIS — I482 Chronic atrial fibrillation, unspecified: Secondary | ICD-10-CM | POA: Diagnosis not present

## 2022-08-03 DIAGNOSIS — H5712 Ocular pain, left eye: Secondary | ICD-10-CM | POA: Diagnosis not present

## 2022-08-03 DIAGNOSIS — N1832 Chronic kidney disease, stage 3b: Secondary | ICD-10-CM | POA: Diagnosis not present

## 2022-08-03 DIAGNOSIS — E039 Hypothyroidism, unspecified: Secondary | ICD-10-CM | POA: Diagnosis not present

## 2022-08-03 DIAGNOSIS — J439 Emphysema, unspecified: Secondary | ICD-10-CM | POA: Diagnosis not present

## 2022-08-03 DIAGNOSIS — L89152 Pressure ulcer of sacral region, stage 2: Secondary | ICD-10-CM | POA: Diagnosis not present

## 2022-08-03 DIAGNOSIS — N1831 Chronic kidney disease, stage 3a: Secondary | ICD-10-CM | POA: Diagnosis not present

## 2022-08-06 ENCOUNTER — Encounter (INDEPENDENT_AMBULATORY_CARE_PROVIDER_SITE_OTHER): Payer: Self-pay | Admitting: Physician Assistant

## 2022-08-06 ENCOUNTER — Telehealth: Payer: Self-pay | Admitting: Cardiology

## 2022-08-06 ENCOUNTER — Telehealth: Payer: Self-pay | Admitting: Physician Assistant

## 2022-08-06 DIAGNOSIS — I5030 Unspecified diastolic (congestive) heart failure: Secondary | ICD-10-CM | POA: Diagnosis not present

## 2022-08-06 DIAGNOSIS — N1832 Chronic kidney disease, stage 3b: Secondary | ICD-10-CM | POA: Diagnosis not present

## 2022-08-06 DIAGNOSIS — S2242XD Multiple fractures of ribs, left side, subsequent encounter for fracture with routine healing: Secondary | ICD-10-CM | POA: Diagnosis not present

## 2022-08-06 DIAGNOSIS — L89152 Pressure ulcer of sacral region, stage 2: Secondary | ICD-10-CM | POA: Diagnosis not present

## 2022-08-06 DIAGNOSIS — I482 Chronic atrial fibrillation, unspecified: Secondary | ICD-10-CM | POA: Diagnosis not present

## 2022-08-06 DIAGNOSIS — I130 Hypertensive heart and chronic kidney disease with heart failure and stage 1 through stage 4 chronic kidney disease, or unspecified chronic kidney disease: Secondary | ICD-10-CM | POA: Diagnosis not present

## 2022-08-06 NOTE — Telephone Encounter (Signed)
Patient is calling because he is having a cardioversion on 11/9, he would like to get instructions on his medications.

## 2022-08-06 NOTE — Telephone Encounter (Signed)
Follow Up:    Please call Jolyne Loa, she said she had talked to you last week about this patient.a

## 2022-08-06 NOTE — Telephone Encounter (Signed)
Sent to Jacqulynn Cadet to f/u with patient.

## 2022-08-06 NOTE — Telephone Encounter (Signed)
Called and spoke with [place]. She stated that she needed new instructions for Amiodarone for patient stating that he is to take 1 tablet by mouth daily after taking 1 tablet 2 times a day for 14 day. I informed her that I will get Fabian Sharp, PA-C to type letter and I will fax to the facility. She thanked me for calling her back . Faxed New Amiodarone instructions to [phone] Attn: Jolyne Loa

## 2022-08-06 NOTE — Telephone Encounter (Signed)
While on the telephone with [facility] the nurse from SN[facility] I informed her that patient called and was asking about instructions that were faxed on Friday 08/03/22 for upcoming cardioversion and if he was near by so that I can review with him. She stated that she will review with patient that I did not need to the facility will do it especially if we went over it with him at the time of office visit.

## 2022-08-06 NOTE — H&P (View-Only) (Signed)
Letter sent for amiodarone taper

## 2022-08-06 NOTE — Telephone Encounter (Signed)
Spoke with  at [facility] [phone]) who wanted clarification on an order for amiodarone from 08/03/22. She stated she spoke with Ellison Carwin. Forwarding message to [employer].

## 2022-08-06 NOTE — Progress Notes (Signed)
Letter sent for amiodarone taper

## 2022-08-07 ENCOUNTER — Ambulatory Visit (HOSPITAL_COMMUNITY): Payer: Medicare Other | Admitting: Nurse Practitioner

## 2022-08-08 ENCOUNTER — Telehealth: Payer: Self-pay | Admitting: Cardiology

## 2022-08-08 ENCOUNTER — Encounter: Payer: Self-pay | Admitting: Cardiology

## 2022-08-08 DIAGNOSIS — L89152 Pressure ulcer of sacral region, stage 2: Secondary | ICD-10-CM | POA: Diagnosis not present

## 2022-08-08 DIAGNOSIS — I5030 Unspecified diastolic (congestive) heart failure: Secondary | ICD-10-CM | POA: Diagnosis not present

## 2022-08-08 DIAGNOSIS — I482 Chronic atrial fibrillation, unspecified: Secondary | ICD-10-CM | POA: Diagnosis not present

## 2022-08-08 DIAGNOSIS — I130 Hypertensive heart and chronic kidney disease with heart failure and stage 1 through stage 4 chronic kidney disease, or unspecified chronic kidney disease: Secondary | ICD-10-CM | POA: Diagnosis not present

## 2022-08-08 DIAGNOSIS — S2242XD Multiple fractures of ribs, left side, subsequent encounter for fracture with routine healing: Secondary | ICD-10-CM | POA: Diagnosis not present

## 2022-08-08 DIAGNOSIS — N1832 Chronic kidney disease, stage 3b: Secondary | ICD-10-CM | POA: Diagnosis not present

## 2022-08-08 NOTE — Telephone Encounter (Signed)
Patient calling with questins/concerns bout his procedure on tomorrow. Please advise

## 2022-08-08 NOTE — Telephone Encounter (Signed)
Contacted patient, advised of message below.   He states he has been gaining weight- they recently changed him at the end of October from Lasix to Torsemide- he states he is taking Torsemide 20 mg twice daily however, he states this has caused him to feel like he is so thirsty, "cotton mouth" feeling. So he has been drinking more, and he also got some boost drinks, and went a little overboard with those (he thinks he could have gained weight by this). Patient does have swelling in his legs but it does improve with elevation and he is wearing compression stockings daily. I did advise I would let Dr.[name] know, and we would let him know of any recommendations.   Cardioversion scheduled for tomorrow 11/8.  Thanks!

## 2022-08-08 NOTE — Telephone Encounter (Signed)
Caller is reporting that the patient has gained 6 lbs.  Caller stated the patient should have a baseline of 160 lbs and is now 166 lbs.  Caller stated the patient's weight gain is mostly in his lower extremities and the patient has 2 plus pitting edemas.  Caller noted the patient is off of his Lasik and is Torsemide.

## 2022-08-08 NOTE — Telephone Encounter (Signed)
Called patient, answered questions in regards to procedure tomorrow.

## 2022-08-08 NOTE — Telephone Encounter (Signed)
Called patient, advised of message from MD.  Patient verbalized understanding.   

## 2022-08-09 ENCOUNTER — Ambulatory Visit (HOSPITAL_COMMUNITY)
Admission: RE | Admit: 2022-08-09 | Discharge: 2022-08-09 | Disposition: A | Discharge status: Home / Self Care | Payer: Medicare Other | Attending: Internal Medicine | Admitting: Internal Medicine

## 2022-08-09 ENCOUNTER — Encounter (HOSPITAL_COMMUNITY)
Admission: RE | Disposition: A | Discharge status: Home / Self Care | Payer: Self-pay | Source: Home / Self Care | Attending: Internal Medicine

## 2022-08-09 ENCOUNTER — Encounter (HOSPITAL_COMMUNITY): Payer: Self-pay | Admitting: Internal Medicine

## 2022-08-09 ENCOUNTER — Encounter: Payer: Self-pay | Admitting: Cardiology

## 2022-08-09 ENCOUNTER — Ambulatory Visit (HOSPITAL_BASED_OUTPATIENT_CLINIC_OR_DEPARTMENT_OTHER): Payer: Medicare Other | Admitting: Anesthesiology

## 2022-08-09 ENCOUNTER — Ambulatory Visit (HOSPITAL_COMMUNITY): Payer: Medicare Other | Admitting: Anesthesiology

## 2022-08-09 DIAGNOSIS — Z87891 Personal history of nicotine dependence: Secondary | ICD-10-CM

## 2022-08-09 DIAGNOSIS — I4891 Unspecified atrial fibrillation: Secondary | ICD-10-CM | POA: Diagnosis not present

## 2022-08-09 DIAGNOSIS — I252 Old myocardial infarction: Secondary | ICD-10-CM

## 2022-08-09 DIAGNOSIS — Z79899 Other long term (current) drug therapy: Secondary | ICD-10-CM | POA: Insufficient documentation

## 2022-08-09 DIAGNOSIS — I4892 Unspecified atrial flutter: Secondary | ICD-10-CM | POA: Insufficient documentation

## 2022-08-09 DIAGNOSIS — Z955 Presence of coronary angioplasty implant and graft: Secondary | ICD-10-CM | POA: Insufficient documentation

## 2022-08-09 DIAGNOSIS — I2510 Atherosclerotic heart disease of native coronary artery without angina pectoris: Secondary | ICD-10-CM | POA: Insufficient documentation

## 2022-08-09 DIAGNOSIS — I110 Hypertensive heart disease with heart failure: Secondary | ICD-10-CM | POA: Insufficient documentation

## 2022-08-09 DIAGNOSIS — J45909 Unspecified asthma, uncomplicated: Secondary | ICD-10-CM | POA: Diagnosis not present

## 2022-08-09 DIAGNOSIS — E785 Hyperlipidemia, unspecified: Secondary | ICD-10-CM | POA: Insufficient documentation

## 2022-08-09 DIAGNOSIS — I5042 Chronic combined systolic (congestive) and diastolic (congestive) heart failure: Secondary | ICD-10-CM | POA: Insufficient documentation

## 2022-08-09 DIAGNOSIS — N179 Acute kidney failure, unspecified: Secondary | ICD-10-CM

## 2022-08-09 DIAGNOSIS — Z7901 Long term (current) use of anticoagulants: Secondary | ICD-10-CM | POA: Insufficient documentation

## 2022-08-09 DIAGNOSIS — E039 Hypothyroidism, unspecified: Secondary | ICD-10-CM | POA: Diagnosis not present

## 2022-08-09 DIAGNOSIS — I509 Heart failure, unspecified: Secondary | ICD-10-CM

## 2022-08-09 DIAGNOSIS — G7000 Myasthenia gravis without (acute) exacerbation: Secondary | ICD-10-CM | POA: Insufficient documentation

## 2022-08-09 HISTORY — PX: CARDIOVERSION: SHX1299

## 2022-08-09 LAB — POCT I-STAT, CHEM 8
BUN: 78 mg/dL — ABNORMAL HIGH (ref 8–23)
Calcium, Ion: 1.13 mmol/L — ABNORMAL LOW (ref 1.15–1.40)
Chloride: 103 mmol/L (ref 98–111)
Creatinine, Ser: 2.4 mg/dL — ABNORMAL HIGH (ref 0.61–1.24)
Glucose, Bld: 90 mg/dL (ref 70–99)
HCT: 35 % — ABNORMAL LOW (ref 39.0–52.0)
Hemoglobin: 11.9 g/dL — ABNORMAL LOW (ref 13.0–17.0)
Potassium: 4.2 mmol/L (ref 3.5–5.1)
Sodium: 138 mmol/L (ref 135–145)
TCO2: 25 mmol/L (ref 22–32)

## 2022-08-09 SURGERY — CARDIOVERSION
Anesthesia: General

## 2022-08-09 MED ORDER — LIDOCAINE 2% (20 MG/ML) 5 ML SYRINGE
INTRAMUSCULAR | Status: DC | PRN
  Administered 2022-08-09: 100 mg via INTRAVENOUS

## 2022-08-09 MED ORDER — PROPOFOL 10 MG/ML IV BOLUS
INTRAVENOUS | Status: DC | PRN
  Administered 2022-08-09: 50 mg via INTRAVENOUS

## 2022-08-09 MED ORDER — SODIUM CHLORIDE 0.9 % IV SOLN: INTRAVENOUS | Status: DC | PRN

## 2022-08-09 NOTE — Discharge Instructions (Signed)
Electrical Cardioversion  Electrical cardioversion is the delivery of a jolt of electricity to restore a normal rhythm to the heart. A rhythm that is too fast or is not regular keeps the heart from pumping well. In this procedure, sticky patches or metal paddles are placed on the chest to deliver electricity to the heart from a device.  If this information does not answer your questions, please call [facility] office at [phone] to clarify.   Follow these instructions at home: You may have some redness on the skin where the shocks were given.  You may apply over-the-counter hydrocortisone cream or aloe vera to alleviate skin irritation. YOU SHOULD NOT DRIVE, use power tools, machinery or perform tasks that involve climbing or major physical exertion for 24 hours (because of the sedation medicines used during the test).  Take over-the-counter and prescription medicines only as told by your health care provider. Ask your health care provider how to check your pulse. Check it often. Rest for 48 hours after the procedure or as told by your health care provider. Avoid or limit your caffeine use as told by your health care provider. Keep all follow-up visits as told by your health care provider. This is important.  FOLLOW UP:  Please also call with any specific questions about appointments or follow up tests. Electrical Cardioversion  Electrical cardioversion is the delivery of a jolt of electricity to restore a normal rhythm to the heart. A rhythm that is too fast or is not regular keeps the heart from pumping well. In this procedure, sticky patches or metal paddles are placed on the chest to deliver electricity to the heart from a device.  If this information does not answer your questions, please call [facility] office at [phone] to clarify.   Follow these instructions at home: You may have some redness on the skin where the shocks were  given.  You may apply over-the-counter hydrocortisone cream or aloe vera to alleviate skin irritation. YOU SHOULD NOT DRIVE, use power tools, machinery or perform tasks that involve climbing or major physical exertion for 24 hours (because of the sedation medicines used during the test).  Take over-the-counter and prescription medicines only as told by your health care provider. Ask your health care provider how to check your pulse. Check it often. Rest for 48 hours after the procedure or as told by your health care provider. Avoid or limit your caffeine use as told by your health care provider. Keep all follow-up visits as told by your health care provider. This is important.  FOLLOW UP:  Please also call with any specific questions about appointments or follow up tests.  

## 2022-08-09 NOTE — Anesthesia Preprocedure Evaluation (Addendum)
Anesthesia Evaluation  Patient identified by MRN, date of birth, ID band Patient awake    Reviewed: Allergy & Precautions, NPO status , Patient's Chart, lab work & pertinent test results, reviewed documented beta blocker date and time   History of Anesthesia Complications (+) history of anesthetic complications  Airway Mallampati: II  TM Distance: >3 FB Neck ROM: Full    Dental  (+) Poor Dentition, Caps, Dental Advisory Given, Chipped,    Pulmonary shortness of breath and with exertion, asthma , pneumonia, former smoker   breath sounds clear to auscultation + decreased breath sounds      Cardiovascular hypertension, Pt. on medications + CAD, + Past MI, + Cardiac Stents and +CHF  + dysrhythmias Atrial Fibrillation  Rhythm:Irregular Rate:Normal  EKG 01/27/21 Atrial flutter, LVH, RBBB, LAFB  Echo 02/03/21 1. Left ventricular ejection fraction, by estimation, is 60 to 65%. The left ventricle has normal function. The left ventricle has no regional wall motion abnormalities. There is mild left ventricular hypertrophy. Left ventricular diastolic function could not be evaluated.  2. Right ventricular systolic function is normal. The right ventricular size is normal. There is normal pulmonary artery systolic pressure. The estimated right ventricular systolic pressure is 33.0 mmHg.  3. Left atrial size was moderately dilated.  4. Right atrial size was moderately dilated.  5. The mitral valve is degenerative. Mild mitral valve regurgitation. Moderate mitral annular calcification.  6. The tricuspid valve is abnormal. Tricuspid valve regurgitation is moderate.  7. The aortic valve is tricuspid. Aortic valve regurgitation is mild to moderate. Mild aortic valve stenosis. Aortic valve area, by VTI measures 1.70 cm. Aortic valve mean gradient measures 15.2 mmHg. Aortic valve Vmax  measures 2.62 m/s.  8. Aortic dilatation noted. There is mild  dilatation of the ascending aorta, measuring 40 mm.  9. The inferior vena cava is dilated in size with >50% respiratory variability, suggesting right atrial pressure of 8 mmHg.   Comparison(s): Changes from prior study are noted. 03/07/2020: LVEF 60-65%,  severe biatrial enlargement, moderate TR, mild AI.    Neuro/Psych Myasthenia gravis  Neuromuscular disease  negative psych ROS   GI/Hepatic Neg liver ROS, hiatal hernia,,,  Endo/Other  Hypothyroidism  Hyperlipidemia  Renal/GU ARFRenal disease  negative genitourinary   Musculoskeletal negative musculoskeletal ROS (+)    Abdominal   Peds  Hematology Xarelto therapy- last dose 5/16 no missed dosages   Anesthesia Other Findings LAST CARDIOVERSION lidocaine 2%   100 mg propofol (DIPRIVAN) BOLUS only 60 mg    Reproductive/Obstetrics                             Anesthesia Physical Anesthesia Plan  ASA: 3  Anesthesia Plan: General   Post-op Pain Management: Minimal or no pain anticipated   Induction: Intravenous  PONV Risk Score and Plan: 2 and Treatment may vary due to age or medical condition  Airway Management Planned: Natural Airway and Mask  Additional Equipment: None  Intra-op Plan:   Post-operative Plan:   Informed Consent: I have reviewed the patients History and Physical, chart, labs and discussed the procedure including the risks, benefits and alternatives for the proposed anesthesia with the patient or authorized representative who has indicated his/her understanding and acceptance.     Dental advisory given  Plan Discussed with: CRNA and Anesthesiologist  Anesthesia Plan Comments:         Anesthesia Quick Evaluation

## 2022-08-09 NOTE — CV Procedure (Signed)
    Electrical Cardioversion Procedure Note Marcus [name] Sr. [id number] 02-21-1932  Procedure: Electrical Cardioversion Indications:  Atrial Fibrillation  Time Out: Verified patient identification, verified procedure,medications/allergies/relevent history reviewed, required imaging and test results available.  Performed  Procedure Details  The patient was NPO after midnight. Anesthesia was administered at the beside   with '100mg'$  of lidocaine and 50 mg propofol.  Cardioversion was done with synchronized biphasic defibrillation with AP pads with 200 Joules.  The patient converted to normal sinus rhythm. The patient tolerated the procedure well   IMPRESSION:  Successful cardioversion of atrial fibrillation    Marcus [name] [name] 08/09/2022, 10:56 AM

## 2022-08-09 NOTE — Interval H&P Note (Signed)
History and Physical Interval Note:  08/09/2022 10:42 AM  Marcus A Turnbough Sr.  has presented today for surgery, with the diagnosis of AFIB.  The various methods of treatment have been discussed with the patient and family. After consideration of risks, benefits and other options for treatment, the patient has consented to  Procedure(s): CARDIOVERSION (N/A) as a surgical intervention.  The patient's history has been reviewed, patient examined, no change in status, stable for surgery.  I have reviewed the patient's chart and labs.  Questions were answered to the patient's satisfaction.     [name] A [name]

## 2022-08-09 NOTE — Transfer of Care (Signed)
Immediate Anesthesia Transfer of Care Note  Patient: Marcus A Rabelo Sr.  Procedure(s) Performed: CARDIOVERSION  Patient Location: Endoscopy Unit  Anesthesia Type:General  Level of Consciousness: drowsy  Airway & Oxygen Therapy: Patient Spontanous Breathing  Post-op Assessment: Report given to RN and Post -op Vital signs reviewed and stable  Post vital signs: Reviewed and stable  Last Vitals:  Vitals Value Taken Time  BP 125/72 (88)   Temp    Pulse 65   Resp 16   SpO2 97%     Last Pain:  Vitals:   08/09/22 0845  TempSrc: Tympanic  PainSc: 0-No pain         Complications: No notable events documented.

## 2022-08-09 NOTE — Anesthesia Postprocedure Evaluation (Signed)
Anesthesia Post Note  Patient: Marcus A Mckee Sr.  Procedure(s) Performed: CARDIOVERSION     Anesthesia Type: General Anesthetic complications: no   No notable events documented.  Last Vitals:  Vitals:   08/09/22 0845  BP: (!) 157/71  Pulse: (!) 55  Resp: 19  Temp: (!) 36.1 C  SpO2: 97%    Last Pain:  Vitals:   08/09/22 0845  TempSrc: Tympanic  PainSc: 0-No pain                 Bryson Corona

## 2022-08-10 DIAGNOSIS — Z7901 Long term (current) use of anticoagulants: Secondary | ICD-10-CM | POA: Diagnosis not present

## 2022-08-10 DIAGNOSIS — I509 Heart failure, unspecified: Secondary | ICD-10-CM | POA: Diagnosis not present

## 2022-08-10 DIAGNOSIS — I4892 Unspecified atrial flutter: Secondary | ICD-10-CM | POA: Diagnosis not present

## 2022-08-10 DIAGNOSIS — I130 Hypertensive heart and chronic kidney disease with heart failure and stage 1 through stage 4 chronic kidney disease, or unspecified chronic kidney disease: Secondary | ICD-10-CM | POA: Diagnosis not present

## 2022-08-10 DIAGNOSIS — N1832 Chronic kidney disease, stage 3b: Secondary | ICD-10-CM | POA: Diagnosis not present

## 2022-08-10 DIAGNOSIS — J439 Emphysema, unspecified: Secondary | ICD-10-CM | POA: Diagnosis not present

## 2022-08-10 DIAGNOSIS — Z9181 History of falling: Secondary | ICD-10-CM | POA: Diagnosis not present

## 2022-08-10 DIAGNOSIS — S066X0D Traumatic subarachnoid hemorrhage without loss of consciousness, subsequent encounter: Secondary | ICD-10-CM | POA: Diagnosis not present

## 2022-08-10 DIAGNOSIS — S2242XD Multiple fractures of ribs, left side, subsequent encounter for fracture with routine healing: Secondary | ICD-10-CM | POA: Diagnosis not present

## 2022-08-10 DIAGNOSIS — I5030 Unspecified diastolic (congestive) heart failure: Secondary | ICD-10-CM | POA: Diagnosis not present

## 2022-08-10 DIAGNOSIS — I482 Chronic atrial fibrillation, unspecified: Secondary | ICD-10-CM | POA: Diagnosis not present

## 2022-08-10 DIAGNOSIS — I714 Abdominal aortic aneurysm, without rupture, unspecified: Secondary | ICD-10-CM | POA: Diagnosis not present

## 2022-08-10 DIAGNOSIS — E039 Hypothyroidism, unspecified: Secondary | ICD-10-CM | POA: Diagnosis not present

## 2022-08-10 DIAGNOSIS — L89152 Pressure ulcer of sacral region, stage 2: Secondary | ICD-10-CM | POA: Diagnosis not present

## 2022-08-10 DIAGNOSIS — I2510 Atherosclerotic heart disease of native coronary artery without angina pectoris: Secondary | ICD-10-CM | POA: Diagnosis not present

## 2022-08-10 DIAGNOSIS — D649 Anemia, unspecified: Secondary | ICD-10-CM | POA: Diagnosis not present

## 2022-08-10 DIAGNOSIS — E785 Hyperlipidemia, unspecified: Secondary | ICD-10-CM | POA: Diagnosis not present

## 2022-08-12 ENCOUNTER — Encounter (HOSPITAL_COMMUNITY): Payer: Self-pay | Admitting: Internal Medicine

## 2022-08-14 DIAGNOSIS — N1832 Chronic kidney disease, stage 3b: Secondary | ICD-10-CM | POA: Diagnosis not present

## 2022-08-14 DIAGNOSIS — S2242XD Multiple fractures of ribs, left side, subsequent encounter for fracture with routine healing: Secondary | ICD-10-CM | POA: Diagnosis not present

## 2022-08-14 DIAGNOSIS — I482 Chronic atrial fibrillation, unspecified: Secondary | ICD-10-CM | POA: Diagnosis not present

## 2022-08-14 DIAGNOSIS — L89152 Pressure ulcer of sacral region, stage 2: Secondary | ICD-10-CM | POA: Diagnosis not present

## 2022-08-14 DIAGNOSIS — I5030 Unspecified diastolic (congestive) heart failure: Secondary | ICD-10-CM | POA: Diagnosis not present

## 2022-08-14 DIAGNOSIS — I130 Hypertensive heart and chronic kidney disease with heart failure and stage 1 through stage 4 chronic kidney disease, or unspecified chronic kidney disease: Secondary | ICD-10-CM | POA: Diagnosis not present

## 2022-08-14 NOTE — Telephone Encounter (Signed)
Left message to call back  

## 2022-08-15 DIAGNOSIS — I482 Chronic atrial fibrillation, unspecified: Secondary | ICD-10-CM | POA: Diagnosis not present

## 2022-08-15 DIAGNOSIS — I5030 Unspecified diastolic (congestive) heart failure: Secondary | ICD-10-CM | POA: Diagnosis not present

## 2022-08-15 DIAGNOSIS — I130 Hypertensive heart and chronic kidney disease with heart failure and stage 1 through stage 4 chronic kidney disease, or unspecified chronic kidney disease: Secondary | ICD-10-CM | POA: Diagnosis not present

## 2022-08-15 DIAGNOSIS — N1832 Chronic kidney disease, stage 3b: Secondary | ICD-10-CM | POA: Diagnosis not present

## 2022-08-15 DIAGNOSIS — L89152 Pressure ulcer of sacral region, stage 2: Secondary | ICD-10-CM | POA: Diagnosis not present

## 2022-08-15 DIAGNOSIS — S2242XD Multiple fractures of ribs, left side, subsequent encounter for fracture with routine healing: Secondary | ICD-10-CM | POA: Diagnosis not present
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
## 2022-08-17 ENCOUNTER — Encounter: Payer: Self-pay | Admitting: Cardiology

## 2022-08-17 ENCOUNTER — Telehealth: Payer: Self-pay | Admitting: Cardiology

## 2022-08-17 NOTE — H&P (Signed)
Cardiology H&P   Patient ID: Lajarvis Italiano.; MRN: [id number]; DOB: 1932-05-14   Admission date: 08/09/2022  LATE ENTRY  Chief Complaint:  AFL  Patient Profile:   [name] is a 88 y.o. male with a history of HF CAD and AFL presenting for  Cardioversion  History of Present Illness:   Mr. Colter presented on amiodarone with sOB,no overt palpitations on on [facility].  No CP.  Found to have AFL.   No bleed issues.  Was planned for DCCV.  No neuro sx suggestive of return to his [facility].  Past Medical History:  Diagnosis Date   Acute on chronic combined systolic and diastolic CHF (congestive heart failure) ([place]) 08/22/2018   Acute on chronic respiratory failure ([place]) 04/15/2015   Acute renal failure ([place]) 04/15/2015   Anemia    Atrial flutter (H[facility])    Bradycardia    CAD in native artery 08/22/2018   CAP (community acquired pneumonia) 04/13/2015   See cxr 04/13/2015 > admit [facility]     Chest pain 08/20/2018   H/O hiatal hernia    Hyperlipidemia LDL goal <70 08/22/2018   Hypertension 08/22/2018   Hypothyroidism 04/13/2015   Myasthenia gravis ([place]) 04/13/2015   Non-ST elevation (NSTEMI) myocardial infarction ([place])    Obesity 04/13/2015   PAF (paroxysmal atrial fibrillation) ([place]) 04/16/2015   RBBB    S/P angioplasty with stent 08/21/18 DES to RCA 08/22/2018   Sepsis ([place]) 04/15/2015   Thrombocytopenia ([place])     Past Surgical History:  Procedure Laterality Date   AMPUTATION  06/11/2012   Procedure: AMPUTATION DIGIT;  Surgeon: [name], MD;  Location: [place];  Service: Orthopedics;  Laterality: Left;  revision of amputation   CARDIOVERSION N/A 03/24/2020   Procedure: CARDIOVERSION;  Surgeon: [name], [name], MD;  Location: [place];  Service: Cardiovascular;  Laterality: N/A;   CARDIOVERSION N/A 02/14/2021   Procedure: CARDIOVERSION;  Surgeon: [name], MD;  Location: [place];  Service: Cardiovascular;  Laterality: N/A;   CARDIOVERSION N/A  11/03/2021   Procedure: CARDIOVERSION;  Surgeon: Buford Dresser, MD;  Location: [facility] ENDOSCOPY;  Service: Cardiovascular;  Laterality: N/A;   CARDIOVERSION N/A 08/09/2022   Procedure: CARDIOVERSION;  Surgeon: Werner Lean, MD;  Location: [place] ENDOSCOPY;  Service: Cardiovascular;  Laterality: N/A;   CORONARY STENT INTERVENTION N/A 08/21/2018   Procedure: CORONARY STENT INTERVENTION;  Surgeon: [name], MD;  Location: [place] CV LAB;  Service: Cardiovascular;  Laterality: N/A;   CORONARY STENT INTERVENTION N/A 10/13/2021   Procedure: CORONARY STENT INTERVENTION;  Surgeon: Burnell Blanks, MD;  Location: [place] CV LAB;  Service: Cardiovascular;  Laterality: N/A;   HERNIA REPAIR  1994   IR ANGIO INTRA EXTRACRAN SEL COM CAROTID INNOMINATE BILAT MOD SED  06/14/2022   LEFT HEART CATH AND CORONARY ANGIOGRAPHY N/A 08/21/2018   Procedure: LEFT HEART CATH AND CORONARY ANGIOGRAPHY;  Surgeon: [name], MD;  Location: [place] CV LAB;  Service: Cardiovascular;  Laterality: N/A;   LEFT HEART CATH AND CORONARY ANGIOGRAPHY N/A 10/13/2021   Procedure: LEFT HEART CATH AND CORONARY ANGIOGRAPHY;  Surgeon: Burnell Blanks, MD;  Location: [place] CV LAB;  Service: Cardiovascular;  Laterality: N/A;   ORIF SHOULDER FRACTURE  06/13/2012   Procedure: OPEN REDUCTION INTERNAL FIXATION (ORIF) SHOULDER FRACTURE;  Surgeon: [name], MD;  Location: [place];  Service: Orthopedics;  Laterality: Left;  LEFT SHOULDER OPEN GREATER TUBEROSITY ORIF   RIGHT/LEFT HEART CATH AND CORONARY  ANGIOGRAPHY N/A 05/11/2022   Procedure: RIGHT/LEFT HEART CATH AND CORONARY ANGIOGRAPHY;  Surgeon: Martinique, [name], MD;  Location: [place] CV LAB;  Service: Cardiovascular;  Laterality: N/A;   SHOULDER CLOSED REDUCTION  06/11/2012   Procedure: CLOSED MANIPULATION SHOULDER;  Surgeon: [name], MD;  Location: [place];  Service: Orthopedics;  Laterality: Left;     Medications Prior to  Admission: Prior to Admission medications   Medication Sig Start Date End Date Taking? Authorizing Provider  acetaminophen (TYLENOL) 325 MG tablet Take 325-650 mg by mouth every 6 (six) hours as needed for mild pain.   Yes [provider]  amiodarone (PACERONE) 200 MG tablet Take 1 tablet (200 mg total) by mouth 2 (two) times daily. 07/26/22  Yes Duke, Tami Lin, PA  amLODipine (NORVASC) 10 MG tablet Take 1 tablet (10 mg total) by mouth daily. 05/08/22  Yes Martinique, [name], MD  busPIRone (BUSPAR) 7.5 MG tablet Take 7.5 mg by mouth 2 (two) times daily. 07/16/22  Yes [provider]  Carboxymethylcellulose Sodium (THERATEARS) 0.25 % SOLN Apply 2 drops to eye in the morning, at noon, in the evening, and at bedtime.   Yes [provider]  Cholecalciferol (VITAMIN D-3) 125 MCG (5000 UT) TABS Take 5,000 Units by mouth daily.   Yes [provider]  docusate sodium (COLACE) 100 MG capsule Take 100 mg by mouth daily as needed for mild constipation.   Yes [provider]  Eyelid Cleansers (OCUSOFT LID SCRUB EX) Apply 1 application  topically 2 (two) times daily.   Yes [provider]  levothyroxine (SYNTHROID) 137 MCG tablet Take 137 mcg by mouth daily before breakfast.   Yes [provider]  neomycin-bacitracin-polymyxin [phone] OINT Apply 1 Application topically daily. Left forearm   Yes [provider]  polyethylene glycol (MIRALAX / GLYCOLAX) 17 g packet Take 17 g by mouth daily as needed for mild constipation.   Yes [provider]  torsemide (DEMADEX) 20 MG tablet Take 1 tablet (20 mg total) by mouth 2 (two) times daily. 07/27/22  Yes Duke, Tami Lin, PA  XARELTO 15 MG TABS tablet Take 15 mg by mouth daily. 06/23/22  Yes [provider]  Magnesium 400 MG TABS Take 400 mg by mouth daily.    [provider]  nitroGLYCERIN (NITROSTAT) 0.4 MG SL tablet Place 0.4 mg under the tongue every 5 (five)  hours as needed for chest pain. 06/23/22   [provider]     Allergies:    Allergies  Allergen Reactions   Ace Inhibitors Hives   Beta Adrenergic Blockers Hives   Statins Hives    Social History:   Social History   Socioeconomic History   Marital status: Divorced    Spouse name: Not on file   Number of children: 7   Years of education: Not on file   Highest education level: Not on file  Occupational History   Not on file  Tobacco Use   Smoking status: Former    Packs/day: 1.00    Years: 30.00    Total pack years: 30.00    Types: Cigarettes    Quit date: 01/10/1982    Years since quitting: 40.6   Smokeless tobacco: Never   Tobacco comments:    Former smoker 10/17/2021  Vaping Use   Vaping Use: Never used  Substance and Sexual Activity   Alcohol use: Not Currently    Alcohol/week: 2.0 standard drinks of alcohol    Types: 2  Glasses of wine per week    Comment: No alcohol since 10/14/2021   Drug use: No   Sexual activity: Never  Other Topics Concern   Not on file  Social History Narrative   Lives alone in a two story home.  Divorced.  Has 7 children.     Retired from [employer].     Education: some college.   Right Handed   Social Determinants of Health   Financial Resource Strain: Not on file  Food Insecurity: Not on file  Transportation Needs: Not on file  Physical Activity: Not on file  Stress: Not on file  Social Connections: Not on file  Intimate Partner Violence: Not on file    Family History:   The patient's family history includes Healthy in his daughter, sister, and son; Heart attack in his father; Other in his mother.    ROS:  Please see the history of present illness.     Physical Exam/Data:   Vitals:   08/09/22 0845 08/09/22 1055 08/09/22 1105 08/09/22 1115  BP: (!) 157/71 125/72 137/78 (!) 143/78  Pulse: (!) 55 66 68 68  Resp: '19 15 15 20  '$ Temp: (!) 97 F (36.1 C) (!) 97 F (36.1 C)    TempSrc: Tympanic Tympanic     SpO2: 97% 96% 96% 97%  Weight: 75.3 kg     Height: '5\' 8"'$  (1.727 m)      No intake or output data in the 24 hours ending 08/17/22 0828 Filed Weights   08/09/22 0845  Weight: 75.3 kg   Body mass index is 25.24 kg/m.   GEN:  Well nourished, well developed in no acute distress HEENT: Normal NECK: No JVD; No carotid bruits LYMPHATICS: No lymphadenopathy CARDIAC: irregular rhythm bradycardic rate RESPIRATORY:  Clear to auscultation without rales, wheezing or rhonchi  ABDOMEN: Soft, non-tender, non-distended MUSCULOSKELETAL:  No edema; No deformity  SKIN: Warm and dry NEUROLOGIC:  Alert and oriented x 3 PSYCHIATRIC:  Normal affect     Laboratory Data:  ChemistryNo results for input(s): "NA", "K", "CL", "CO2", "GLUCOSE", "BUN", "CREATININE", "CALCIUM", "GFRNONAA", "GFRAA", "ANIONGAP" in the last 168 hours.  No results for input(s): "PROT", "ALBUMIN", "AST", "ALT", "ALKPHOS", "BILITOT" in the last 168 hours. HematologyNo results for input(s): "WBC", "RBC", "HGB", "HCT", "MCV", "MCH", "MCHC", "RDW", "PLT" in the last 168 hours. Cardiac EnzymesNo results for input(s): "TROPONINI" in the last 168 hours. No results for input(s): "TROPIPOC" in the last 168 hours.  BNPNo results for input(s): "BNP", "PROBNP" in the last 168 hours.  DDimer No results for input(s): "DDIMER" in the last 168 hours.  Radiology/Studies:  No results found.  Assessment and Plan:   Persistent atrial flutter Chronic anticoagulation On amiodarone 200 mg daily Anticoagulated with Xarelto      [facility] has been requested to perform a transesophageal echocardiogram on Pryor A Perlstein Sr. for cardioversion.  After careful review of history and examination, the risks and benefits of transesophageal echocardiogram have been explained including aspiration, arrhythmia, respiratory failure and death. Alternatives to treatment were discussed, questions were answered. Patient is willing to proceed.    For  questions or updates, please contact [place] Please consult [email or web address] for contact info under Cardiology/STEMI.   Rudean Haskell, MD [facility], [place] [address] [phone]  8:28 AM

## 2022-08-17 NOTE — Telephone Encounter (Signed)
Patient returned nurse call.

## 2022-08-17 NOTE — Telephone Encounter (Signed)
Pt doing well since starting Amiodarone/DCCV. States he is in a NSR. Asked about follow up with Dr. [name]. Aware scheduler will reach out to arrange 3 month follow up with him. Patient verbalized understanding and agreeable to plan.

## 2022-08-20 MED ORDER — TORSEMIDE 40 MG PO TABS: 80.0000 mg | ORAL_TABLET | Freq: Every day | ORAL | 3 refills | Status: DC

## 2022-08-20 NOTE — Telephone Encounter (Signed)
Order faxed to [facility]

## 2022-08-21 ENCOUNTER — Telehealth: Payer: Self-pay | Admitting: Cardiology

## 2022-08-21 NOTE — Telephone Encounter (Signed)
[name] with [name] at [facility] states they received just a cover sheet for an order from Dr. Martinique. She states she does not know what the order was for because they only received the cover sheet. She is requesting to have it faxed again.

## 2022-08-21 NOTE — Telephone Encounter (Signed)
Attempted to fax orders 4 times, to 2 separate fax numbers. Confirmation receipt shows a communication error. Tried to return call to discuss this, unable to leave voicemail, mailbox is full.

## 2022-08-22 ENCOUNTER — Encounter: Payer: Self-pay | Admitting: General Practice

## 2022-08-22 ENCOUNTER — Ambulatory Visit: Payer: Medicare Other | Attending: General Practice | Admitting: General Practice

## 2022-08-22 VITALS — BP 128/64 | Ht 68.0 in | Wt 169.0 lb

## 2022-08-22 DIAGNOSIS — I4819 Other persistent atrial fibrillation: Secondary | ICD-10-CM | POA: Diagnosis not present

## 2022-08-22 DIAGNOSIS — I5032 Chronic diastolic (congestive) heart failure: Secondary | ICD-10-CM | POA: Insufficient documentation

## 2022-08-22 NOTE — Progress Notes (Signed)
Pt informed of providers recommendations. Pt verbalized understanding. No further questions .  

## 2022-08-22 NOTE — Telephone Encounter (Signed)
Spoke with [place] at [facility]. She stated she never received the orders for BMP and BNP from Dr. Martinique. There is an issue with their fax machine. She stated patient is being seen today. She would like all orders sent with the patient.

## 2022-08-22 NOTE — Patient Instructions (Addendum)
Medication Instructions:   Your physician recommends that you continue on your current medications as directed. Please refer to the Current Medication list given to you today.  *If you need a refill on your cardiac medications before your next appointment, please call your pharmacy*  Lab Work: Your physician recommends that you return for lab work TODAY:  BMP If you have labs (blood work) drawn today and your tests are completely normal, you will receive your results only by: [place] (if you have [place]) OR A paper copy in the mail If you have any lab test that is abnormal or we need to change your treatment, we will call you to review the results.  Testing/Procedures: NONE ordered at this time of appointment   Follow-Up: At [facility], you and your health needs are our priority.  As part of our continuing mission to provide you with exceptional heart care, we have created designated Provider Care Teams.  These Care Teams include your primary Cardiologist (physician) and Advanced Practice Providers (APPs -  Physician Assistants and Nurse Practitioners) who all work together to provide you with the care you need, when you need it.  Your next appointment:   As previously scheduled  The format for your next appointment:   In Person  Provider:   Peter Martinique, MD         Other Instructions Your daily fluid intake should be between 48-52 fluid ounces. Fluid includes all drinks, coffee, juice, ice chips, soup, jello, and all other liquids. Maintain Physical Activity as tolerated Continue to wear your support socks/sockings   How to Increase Your Level of Physical Activity  Getting regular physical activity is important for your overall health and well-being. Most people do not get enough exercise. There are easy ways to increase your level of physical activity, even if you have not been very active in the past or if you are just starting out. What are the benefits  of physical activity? Physical activity has many short-term and long-term benefits. Being active on a regular basis can improve your physical and mental health as well as provide other benefits. Physical health benefits Helping you lose weight or maintain a healthy weight. Strengthening your muscles and bones. Reducing your risk of certain long-term (chronic) diseases, including heart disease, cancer, and diabetes. Being able to move around more easily and for longer periods of time without getting tired (increased endurance or stamina). Improving your ability to fight off illness (enhanced immunity). Being able to sleep better. Helping you stay healthy as you get older, including: Helping you stay mobile, or capable of walking and moving around. Preventing accidents, such as falls. Increasing life expectancy. Mental health benefits Boosting your mood and improving your self-esteem. Lowering your chance of having mental health problems, such as depression or anxiety. Helping you feel good about your body. Other benefits Finding new sources of fun and enjoyment. Meeting new people who share a common interest. Before you begin If you have a chronic illness or have not been active for a while, check with your health care provider about how to get started. Ask your health care provider what activities are safe for you. Start out slowly. Walking or doing some simple chair exercises is a good place to start, especially if you have not been active before or for a long time. Set goals that you can work toward. Ask your health care provider how much exercise is best for you. In general, most adults should: Do moderate-intensity  exercise for at least 150 minutes each week (30 minutes on most days of the week) or vigorous exercise for at least 75 minutes each week, or a combination of these. Moderate-intensity exercise can include walking at a quick pace, biking, yoga, water aerobics, or  gardening. Vigorous exercise involves activities that take more effort, such as jogging or running, playing sports, swimming laps, or jumping rope. Do strength exercises on at least 2 days each week. This can include weight lifting, body weight exercises, and resistance-band exercises. How to be more physically active Make a plan  Try to find activities that you enjoy. You are more likely to commit to an exercise routine if it does not feel like a chore. If you have bone or joint problems, choose low-impact exercises, like walking or swimming. Use these tips for being successful with an exercise plan: Find a workout partner for accountability. Join a group or class, such as an aerobics class, cycling class, or sports team. Make family time active. Go for a walk, bike, or swim. Include a variety of exercises each week. Consider using a fitness tracker, such as a mobile phone app or a device worn like a watch, that will count the number of steps you take each day. Many people strive to reach 10,000 steps a day. Find ways to be active in your daily routines Besides your formal exercise plans, you can find ways to do physical activity during your daily routines, such as: Walking or biking to work or to the store. Taking the stairs instead of the elevator. Parking farther away from the door at work or at the store. Planning walking meetings. Walking around while you are on the phone. Where to find more information Centers for Disease Control and Prevention: WorkDashboard.es President's Council on Fitness, Sports & Nutrition: www.fitness.gov ChooseMyPlate: MassVoice.es Contact a health care provider if: You have headaches, muscle aches, or joint pain that is concerning. You feel dizzy or light-headed while exercising. You faint. You feel your heart skipping, racing, or fluttering. You have chest pain while exercising. Summary Exercise benefits your mind and body at any age,  even if you are just starting out. If you have a chronic illness or have not been active for a while, check with your health care provider before increasing your physical activity. Choose activities that are safe and enjoyable for you. Ask your health care provider what activities are safe for you. Start slowly. Tell your health care provider if you have problems as you start to increase your activity level. This information is not intended to replace advice given to you by your health care provider. Make sure you discuss any questions you have with your health care provider. Document Revised: 01/13/2021 Document Reviewed: 01/13/2021 Elsevier Patient Education  [place]

## 2022-08-23 LAB — BASIC METABOLIC PANEL
BUN/Creatinine Ratio: 34 — ABNORMAL HIGH (ref 10–24)
BUN: 54 mg/dL — ABNORMAL HIGH (ref 8–27)
CO2: 23 mmol/L (ref 20–29)
Calcium: 8.7 mg/dL (ref 8.6–10.2)
Chloride: 102 mmol/L (ref 96–106)
Creatinine, Ser: 1.61 mg/dL — ABNORMAL HIGH (ref 0.76–1.27)
Glucose: 91 mg/dL (ref 70–99)
Potassium: 4.4 mmol/L (ref 3.5–5.2)
Sodium: 140 mmol/L (ref 134–144)
eGFR: 41 mL/min/{1.73_m2} — ABNORMAL LOW (ref >59)

## 2022-08-27 NOTE — Telephone Encounter (Signed)
Returned call to [place] at [facility] on voice mail to refax orders to be signed to fax # [phone].

## 2022-08-27 NOTE — Telephone Encounter (Signed)
[name] from [facility] called and stated they faxed over again this morning for a order that needs to be signed for the change in his Spironolactone.  They need a order to stop this med.   Best number is [phone]

## 2022-08-28 DIAGNOSIS — M6281 Muscle weakness (generalized): Secondary | ICD-10-CM | POA: Diagnosis not present

## 2022-08-29 ENCOUNTER — Telehealth: Payer: Self-pay | Admitting: Cardiology

## 2022-08-29 NOTE — Telephone Encounter (Signed)
  [name] at [facility] she said, they keep receiving fax from [place] but its only the cover page but not the order itself. She ask if we can refax order for new meds for the pt and just fax the order without the cover page. She gave fax# [phone] or [phone]

## 2022-08-30 DIAGNOSIS — M6281 Muscle weakness (generalized): Secondary | ICD-10-CM | POA: Diagnosis not present

## 2022-08-30 NOTE — Telephone Encounter (Signed)
Patient's medication list faxed to [facility] at fax # [phone].

## 2022-08-31 ENCOUNTER — Encounter: Payer: Self-pay | Admitting: Cardiology

## 2022-08-31 NOTE — Telephone Encounter (Signed)
See previous 11/30 telephone note.

## 2022-09-04 DIAGNOSIS — M6281 Muscle weakness (generalized): Secondary | ICD-10-CM | POA: Diagnosis not present
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
## 2022-09-05 DIAGNOSIS — J439 Emphysema, unspecified: Secondary | ICD-10-CM | POA: Diagnosis not present

## 2022-09-05 DIAGNOSIS — H6123 Impacted cerumen, bilateral: Secondary | ICD-10-CM | POA: Diagnosis not present

## 2022-09-05 DIAGNOSIS — F418 Other specified anxiety disorders: Secondary | ICD-10-CM | POA: Diagnosis not present

## 2022-09-05 DIAGNOSIS — E039 Hypothyroidism, unspecified: Secondary | ICD-10-CM | POA: Diagnosis not present

## 2022-09-05 DIAGNOSIS — I5031 Acute diastolic (congestive) heart failure: Secondary | ICD-10-CM | POA: Diagnosis not present

## 2022-09-05 DIAGNOSIS — I129 Hypertensive chronic kidney disease with stage 1 through stage 4 chronic kidney disease, or unspecified chronic kidney disease: Secondary | ICD-10-CM | POA: Diagnosis not present

## 2022-09-05 DIAGNOSIS — N1831 Chronic kidney disease, stage 3a: Secondary | ICD-10-CM | POA: Diagnosis not present

## 2022-09-05 DIAGNOSIS — L98429 Non-pressure chronic ulcer of back with unspecified severity: Secondary | ICD-10-CM | POA: Diagnosis not present

## 2022-09-05 DIAGNOSIS — J90 Pleural effusion, not elsewhere classified: Secondary | ICD-10-CM | POA: Diagnosis not present

## 2022-09-06 ENCOUNTER — Ambulatory Visit: Payer: Medicare Other | Attending: Cardiology | Admitting: Cardiology

## 2022-09-06 ENCOUNTER — Encounter: Payer: Self-pay | Admitting: Cardiology

## 2022-09-06 VITALS — BP 130/62 | HR 62 | Ht 68.0 in | Wt 167.8 lb

## 2022-09-06 DIAGNOSIS — N1832 Chronic kidney disease, stage 3b: Secondary | ICD-10-CM | POA: Diagnosis not present

## 2022-09-06 DIAGNOSIS — Z951 Presence of aortocoronary bypass graft: Secondary | ICD-10-CM

## 2022-09-06 DIAGNOSIS — I2510 Atherosclerotic heart disease of native coronary artery without angina pectoris: Secondary | ICD-10-CM | POA: Diagnosis not present

## 2022-09-06 DIAGNOSIS — I5032 Chronic diastolic (congestive) heart failure: Secondary | ICD-10-CM | POA: Diagnosis not present

## 2022-09-06 DIAGNOSIS — I484 Atypical atrial flutter: Secondary | ICD-10-CM | POA: Diagnosis not present

## 2022-09-06 MED ORDER — AMIODARONE HCL 200 MG PO TABS: 200.0000 mg | ORAL_TABLET | Freq: Every day | ORAL | 3 refills | Status: DC

## 2022-09-06 MED ORDER — EMPAGLIFLOZIN 10 MG PO TABS: 10.0000 mg | ORAL_TABLET | Freq: Every day | ORAL | 11 refills | Status: DC

## 2022-09-06 NOTE — Patient Instructions (Addendum)
Medication Instructions:  Start Jardiance 10 mg every morning before breakfast Continue all other medications *If you need a refill on your cardiac medications before your next appointment, please call your pharmacy*   Lab Work: None ordered   Testing/Procedures: None ordered   Follow-Up: At [facility], you and your health needs are our priority.  As part of our continuing mission to provide you with exceptional heart care, we have created designated Provider Care Teams.  These Care Teams include your primary Cardiologist (physician) and Advanced Practice Providers (APPs -  Physician Assistants and Nurse Practitioners) who all work together to provide you with the care you need, when you need it.  We recommend signing up for the patient portal called "MyChart".  Sign up information is provided on this After Visit Summary.  MyChart is used to connect with patients for Virtual Visits (Telemedicine).  Patients are able to view lab/test results, encounter notes, upcoming appointments, etc.  Non-urgent messages can be sent to your provider as well.   To learn more about what you can do with MyChart, go to [email or web address].    Your next appointment:  1 month    The format for your next appointment: Office     Provider: Dr.[name]'s PA    Wear thigh hi moderate compression stockings     Important Information About Sugar

## 2022-09-07 ENCOUNTER — Encounter: Payer: Self-pay | Admitting: Cardiology

## 2022-09-10 ENCOUNTER — Encounter: Payer: Self-pay | Admitting: Cardiology

## 2022-09-11 ENCOUNTER — Telehealth: Payer: Self-pay | Admitting: Cardiology

## 2022-09-11 NOTE — Telephone Encounter (Signed)
  Pt c/o medication issue:  1. Name of Medication:   amiodarone (PACERONE) 200 MG tablet    2. How are you currently taking this medication (dosage and times per day)?   Take 1 tablet (200 mg total) by mouth daily.    3. Are you having a reaction (difficulty breathing--STAT)? No   4. What is your medication issue? Pt send a message from pt schedule on mychart,    Comments: I have an appointmenr NEED to know hw long to continue 200 amo\ioderone Been in rhythm 32 days  Please advise

## 2022-09-11 NOTE — Telephone Encounter (Signed)
Spoke to patient, advised to continue amiodarone as prescribed and keep follow up appt as scheduled.

## 2022-09-12 ENCOUNTER — Encounter: Payer: Self-pay | Admitting: Podiatry

## 2022-09-12 ENCOUNTER — Other Ambulatory Visit: Payer: Self-pay

## 2022-09-12 ENCOUNTER — Ambulatory Visit (INDEPENDENT_AMBULATORY_CARE_PROVIDER_SITE_OTHER): Payer: Medicare Other | Admitting: Podiatry

## 2022-09-12 DIAGNOSIS — L600 Ingrowing nail: Secondary | ICD-10-CM

## 2022-09-12 DIAGNOSIS — M2042 Other hammer toe(s) (acquired), left foot: Secondary | ICD-10-CM

## 2022-09-12 DIAGNOSIS — I2510 Atherosclerotic heart disease of native coronary artery without angina pectoris: Secondary | ICD-10-CM

## 2022-09-12 DIAGNOSIS — I484 Atypical atrial flutter: Secondary | ICD-10-CM

## 2022-09-12 MED ORDER — XARELTO 15 MG PO TABS: 15.0000 mg | ORAL_TABLET | Freq: Every day | ORAL | 1 refills | Status: DC

## 2022-09-12 NOTE — Telephone Encounter (Signed)
Prescription refill request for Xarelto received.  Indication: Aflutter Last office visit: 09/06/22 (Martinique)  Weight: 76.1kg Age: 88 Scr: 1.61 (08/22/22)  CrCl: 34.35m/min  Appropriate dose and refill sent to requested pharmacy.

## 2022-09-12 NOTE — Patient Instructions (Signed)

## 2022-09-12 NOTE — Progress Notes (Signed)
Subjective:   Patient ID: Marcus Kaufmann Sr., male   DOB: 88 y.o.   MRN: [id number]   HPI Patient states he developed a lot of pain in his second toe left foot and also the big toe has been thickened and hard for him to take care of it and presses against the second toe   ROS      Objective:  Physical Exam  Neurovascular unchanged from previous visit with patient found to have an irritated area on the medial side second digit left consistent with corn from pressure of the big toe pressing along with a thickened nail bed left hallux that presses against the second toe     Assessment:  Inflammatory condition consistent with pressure between the 2 digits leading to digital deformity second left     Plan:  educated him on soaks and cushioning of the area and offloading along with wider shoes debrided some nailbed to just take pressure off the second toe reappoint if symptoms continue

## 2022-09-27 ENCOUNTER — Encounter: Payer: Self-pay | Admitting: Cardiology

## 2022-09-27 NOTE — Telephone Encounter (Signed)
Patient stated he thinks he is back in afib, 7 weeks after cardioversion. He has SOB with minimal exertion; not at rest. Denies chest pain. Appointment made with Vella Raring for 12/29.

## 2022-09-28 ENCOUNTER — Encounter (HOSPITAL_BASED_OUTPATIENT_CLINIC_OR_DEPARTMENT_OTHER): Payer: Self-pay | Admitting: Family

## 2022-09-28 ENCOUNTER — Ambulatory Visit (INDEPENDENT_AMBULATORY_CARE_PROVIDER_SITE_OTHER): Payer: Medicare Other | Admitting: Family

## 2022-09-28 VITALS — BP 120/68 | HR 53 | Ht 68.0 in | Wt 163.0 lb

## 2022-09-28 DIAGNOSIS — E785 Hyperlipidemia, unspecified: Secondary | ICD-10-CM

## 2022-09-28 DIAGNOSIS — I10 Essential (primary) hypertension: Secondary | ICD-10-CM | POA: Diagnosis not present

## 2022-09-28 DIAGNOSIS — D6859 Other primary thrombophilia: Secondary | ICD-10-CM | POA: Diagnosis not present

## 2022-09-28 DIAGNOSIS — I5032 Chronic diastolic (congestive) heart failure: Secondary | ICD-10-CM | POA: Diagnosis not present

## 2022-09-28 DIAGNOSIS — Z79899 Other long term (current) drug therapy: Secondary | ICD-10-CM

## 2022-09-28 DIAGNOSIS — I25118 Atherosclerotic heart disease of native coronary artery with other forms of angina pectoris: Secondary | ICD-10-CM | POA: Diagnosis not present

## 2022-09-28 DIAGNOSIS — I484 Atypical atrial flutter: Secondary | ICD-10-CM | POA: Diagnosis not present

## 2022-09-28 MED ORDER — EMPAGLIFLOZIN 10 MG PO TABS: 10.0000 mg | ORAL_TABLET | Freq: Every day | ORAL | 3 refills | Status: DC
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
## 2022-09-28 NOTE — Patient Instructions (Addendum)
Medication Instructions:  Your physician has recommended you make the following change in your medication:   START Jardiance one tablet daily  CONTINUE Torsemide 2 tablet daily If weight gain 2 pounds overnight or 5 pounds in one week please take 3 tablets.   *If you need a refill on your cardiac medications before your next appointment, please call your pharmacy*   Lab Work: Your physician recommends that you return for lab work at PCP for BMP, magnesium, TSH, CBC  If you have labs (blood work) drawn today and your tests are completely normal, you will receive your results only by: [place] (if you have MyChart) OR A paper copy in the mail If you have any lab test that is abnormal or we need to change your treatment, we will call you to review the results.   Testing/Procedures: Your EKG today showed atrial flutter   Follow-Up: At [facility], you and your health needs are our priority.  As part of our continuing mission to provide you with exceptional heart care, we have created designated Provider Care Teams.  These Care Teams include your primary Cardiologist (physician) and Advanced Practice Providers (APPs -  Physician Assistants and Nurse Practitioners) who all work together to provide you with the care you need, when you need it.  We recommend signing up for the patient portal called "MyChart".  Sign up information is provided on this After Visit Summary.  MyChart is used to connect with patients for Virtual Visits (Telemedicine).  Patients are able to view lab/test results, encounter notes, upcoming appointments, etc.  Non-urgent messages can be sent to your provider as well.   To learn more about what you can do with MyChart, go to [email or web address].    Your next appointment:   We will reach out to schedule you a sooner appointment with Dr. [name] or his team.   Other Instructions  Recommend weighing daily and keeping a log. Please call our office  if you have weight gain of 2 pounds overnight or 5 pounds in 1 week.   Date  Time Weight

## 2022-10-02 ENCOUNTER — Encounter (HOSPITAL_BASED_OUTPATIENT_CLINIC_OR_DEPARTMENT_OTHER): Payer: Self-pay | Admitting: Family

## 2022-10-02 ENCOUNTER — Encounter (HOSPITAL_BASED_OUTPATIENT_CLINIC_OR_DEPARTMENT_OTHER): Payer: Self-pay

## 2022-10-05 ENCOUNTER — Encounter: Payer: Self-pay | Admitting: Cardiology

## 2022-10-05 DIAGNOSIS — I484 Atypical atrial flutter: Secondary | ICD-10-CM

## 2022-10-05 MED ORDER — AMIODARONE HCL 200 MG PO TABS: 200.0000 mg | ORAL_TABLET | Freq: Every day | ORAL | 3 refills | Status: DC

## 2022-10-05 MED ORDER — XARELTO 15 MG PO TABS: 15.0000 mg | ORAL_TABLET | Freq: Every day | ORAL | 1 refills | Status: AC

## 2022-10-08 NOTE — Telephone Encounter (Signed)
Pt. Responding

## 2022-10-08 NOTE — Telephone Encounter (Signed)
Please advise 

## 2022-10-10 DIAGNOSIS — I5031 Acute diastolic (congestive) heart failure: Secondary | ICD-10-CM | POA: Diagnosis not present

## 2022-10-10 DIAGNOSIS — I4892 Unspecified atrial flutter: Secondary | ICD-10-CM | POA: Diagnosis not present

## 2022-10-10 DIAGNOSIS — J439 Emphysema, unspecified: Secondary | ICD-10-CM | POA: Diagnosis not present

## 2022-10-10 DIAGNOSIS — E039 Hypothyroidism, unspecified: Secondary | ICD-10-CM | POA: Diagnosis not present

## 2022-10-10 DIAGNOSIS — E871 Hypo-osmolality and hyponatremia: Secondary | ICD-10-CM | POA: Diagnosis not present

## 2022-10-10 DIAGNOSIS — N1831 Chronic kidney disease, stage 3a: Secondary | ICD-10-CM | POA: Diagnosis not present

## 2022-10-10 DIAGNOSIS — F418 Other specified anxiety disorders: Secondary | ICD-10-CM | POA: Diagnosis not present

## 2022-10-10 DIAGNOSIS — I129 Hypertensive chronic kidney disease with stage 1 through stage 4 chronic kidney disease, or unspecified chronic kidney disease: Secondary | ICD-10-CM | POA: Diagnosis not present

## 2022-10-11 ENCOUNTER — Ambulatory Visit: Payer: Medicare Other | Admitting: Physician Assistant

## 2022-10-12 ENCOUNTER — Encounter: Payer: Self-pay | Admitting: Podiatry

## 2022-10-12 ENCOUNTER — Ambulatory Visit (INDEPENDENT_AMBULATORY_CARE_PROVIDER_SITE_OTHER): Payer: Medicare Other | Admitting: Podiatry

## 2022-10-12 VITALS — BP 143/64

## 2022-10-12 DIAGNOSIS — E538 Deficiency of other specified B group vitamins: Secondary | ICD-10-CM

## 2022-10-12 DIAGNOSIS — G629 Polyneuropathy, unspecified: Secondary | ICD-10-CM | POA: Diagnosis not present

## 2022-10-12 DIAGNOSIS — M79675 Pain in left toe(s): Secondary | ICD-10-CM | POA: Diagnosis not present

## 2022-10-12 DIAGNOSIS — M79674 Pain in right toe(s): Secondary | ICD-10-CM | POA: Diagnosis not present

## 2022-10-12 DIAGNOSIS — B351 Tinea unguium: Secondary | ICD-10-CM

## 2022-10-12 NOTE — Progress Notes (Unsigned)
  Subjective:  Patient ID: Marcus Kaufmann Sr., male    DOB: Aug 16, 1932,  MRN: [id number]  Marcus Kaufmann Sr. presents to clinic today for {jgcomplaint:23593}  Chief Complaint  Patient presents with   Nail Problem    [facility] PCP VST-10/10/2022   New problem(s): None. {jgcomplaint:23593}  PCP is [place], Annie Main, MD.  Allergies  Allergen Reactions   Ace Inhibitors Hives   Beta Adrenergic Blockers Hives   Statins Hives    Review of Systems: Negative except as noted in the HPI.  Objective: No changes noted in today's physical examination. Vitals:   10/12/22 1044  BP: (!) 143/64   Marcus A Chinchilla Sr. is a pleasant 89 y.o. male {jgbodyhabitus:24098} AAO x 3.  Vascular CFT immediate b/l LE. Palpable DP/PT pulses b/l LE. Digital hair sparse b/l. Skin temperature gradient WNL b/l. No pain with calf compression b/l. +1 edema LLE. No cyanosis or clubbing noted b/l LE.  Neurologic Normal speech. Oriented to person, place, and time. Pt has subjective symptoms of neuropathy. Protective sensation intact 5/5 intact bilaterally with 10g monofilament b/l. Vibratory sensation intact b/l.  Dermatologic Pedal integument with normal turgor, texture and tone BLE. No open wounds b/l LE. No interdigital macerations noted b/l LE. Toenails 1-5 b/l elongated, discolored, dystrophic, thickened, crumbly with subungual debris and tenderness to dorsal palpation. Resolved hyperkeratotic lesion(s) submet head 5 b/l and medial DIPJ of left 2nd digit.    Orthopedic: Normal muscle strength 5/5 to all lower extremity muscle groups bilaterally. HAV with bunion deformity noted b/l LE. Hammertoes 2nd digit b/l. No pain, crepitus or joint limitation noted with ROM b/l LE.  Patient is in wheelchair on today's visit.   Radiographs: None Assessment/Plan: No diagnosis found.  No orders of the defined types were placed in this encounter.   None {Jgplan:23602::"-Patient/POA to call should there be  question/concern in the interim."}   Return in about 3 months (around 01/11/2023).  Marzetta Board, DPM

## 2022-10-16 ENCOUNTER — Other Ambulatory Visit: Payer: Self-pay

## 2022-10-16 ENCOUNTER — Emergency Department (HOSPITAL_COMMUNITY): Payer: Medicare Other

## 2022-10-16 ENCOUNTER — Encounter (HOSPITAL_COMMUNITY): Payer: Self-pay | Admitting: Emergency Medicine

## 2022-10-16 ENCOUNTER — Observation Stay (HOSPITAL_COMMUNITY)
Admission: EM | Admit: 2022-10-16 | Discharge: 2022-10-17 | Disposition: A | Discharge status: Home / Self Care | Payer: Medicare Other | Attending: Internal Medicine | Admitting: Internal Medicine

## 2022-10-16 DIAGNOSIS — N1831 Chronic kidney disease, stage 3a: Secondary | ICD-10-CM

## 2022-10-16 DIAGNOSIS — I714 Abdominal aortic aneurysm, without rupture, unspecified: Secondary | ICD-10-CM | POA: Diagnosis present

## 2022-10-16 DIAGNOSIS — Z79899 Other long term (current) drug therapy: Secondary | ICD-10-CM | POA: Insufficient documentation

## 2022-10-16 DIAGNOSIS — I5042 Chronic combined systolic (congestive) and diastolic (congestive) heart failure: Secondary | ICD-10-CM | POA: Diagnosis not present

## 2022-10-16 DIAGNOSIS — I480 Paroxysmal atrial fibrillation: Secondary | ICD-10-CM | POA: Diagnosis not present

## 2022-10-16 DIAGNOSIS — E860 Dehydration: Secondary | ICD-10-CM | POA: Diagnosis present

## 2022-10-16 DIAGNOSIS — I130 Hypertensive heart and chronic kidney disease with heart failure and stage 1 through stage 4 chronic kidney disease, or unspecified chronic kidney disease: Secondary | ICD-10-CM | POA: Diagnosis not present

## 2022-10-16 DIAGNOSIS — Z7984 Long term (current) use of oral hypoglycemic drugs: Secondary | ICD-10-CM | POA: Insufficient documentation

## 2022-10-16 DIAGNOSIS — N179 Acute kidney failure, unspecified: Principal | ICD-10-CM | POA: Diagnosis present

## 2022-10-16 DIAGNOSIS — E872 Acidosis, unspecified: Secondary | ICD-10-CM

## 2022-10-16 DIAGNOSIS — E871 Hypo-osmolality and hyponatremia: Secondary | ICD-10-CM | POA: Diagnosis not present

## 2022-10-16 DIAGNOSIS — E039 Hypothyroidism, unspecified: Secondary | ICD-10-CM | POA: Diagnosis present

## 2022-10-16 DIAGNOSIS — Z87891 Personal history of nicotine dependence: Secondary | ICD-10-CM | POA: Insufficient documentation

## 2022-10-16 DIAGNOSIS — R8281 Pyuria: Secondary | ICD-10-CM | POA: Diagnosis not present

## 2022-10-16 DIAGNOSIS — D649 Anemia, unspecified: Secondary | ICD-10-CM | POA: Diagnosis not present

## 2022-10-16 DIAGNOSIS — E876 Hypokalemia: Secondary | ICD-10-CM | POA: Diagnosis not present

## 2022-10-16 DIAGNOSIS — I5032 Chronic diastolic (congestive) heart failure: Secondary | ICD-10-CM | POA: Diagnosis not present

## 2022-10-16 DIAGNOSIS — G7000 Myasthenia gravis without (acute) exacerbation: Secondary | ICD-10-CM | POA: Diagnosis present

## 2022-10-16 DIAGNOSIS — Z7901 Long term (current) use of anticoagulants: Secondary | ICD-10-CM | POA: Diagnosis not present

## 2022-10-16 DIAGNOSIS — R109 Unspecified abdominal pain: Secondary | ICD-10-CM | POA: Diagnosis not present

## 2022-10-16 DIAGNOSIS — I10 Essential (primary) hypertension: Secondary | ICD-10-CM

## 2022-10-16 LAB — CBC WITH DIFFERENTIAL/PLATELET
Abs Immature Granulocytes: 0.08 10*3/uL — ABNORMAL HIGH (ref 0.00–0.07)
Basophils Absolute: 0 10*3/uL (ref 0.0–0.1)
Basophils Relative: 0 %
Eosinophils Absolute: 0 10*3/uL (ref 0.0–0.5)
Eosinophils Relative: 0 %
HCT: 34.9 % — ABNORMAL LOW (ref 39.0–52.0)
Hemoglobin: 12 g/dL — ABNORMAL LOW (ref 13.0–17.0)
Immature Granulocytes: 1 %
Lymphocytes Relative: 3 %
Lymphs Abs: 0.3 10*3/uL — ABNORMAL LOW (ref 0.7–4.0)
MCH: 30.5 pg (ref 26.0–34.0)
MCHC: 34.4 g/dL (ref 30.0–36.0)
MCV: 88.6 fL (ref 80.0–100.0)
Monocytes Absolute: 0.6 10*3/uL (ref 0.1–1.0)
Monocytes Relative: 6 %
Neutro Abs: 9.4 10*3/uL — ABNORMAL HIGH (ref 1.7–7.7)
Neutrophils Relative %: 90 %
Platelets: 211 10*3/uL (ref 150–400)
RBC: 3.94 MIL/uL — ABNORMAL LOW (ref 4.22–5.81)
RDW: 15.1 % (ref 11.5–15.5)
WBC: 10.5 10*3/uL (ref 4.0–10.5)
nRBC: 0 % (ref 0.0–0.2)

## 2022-10-16 LAB — COMPREHENSIVE METABOLIC PANEL
ALT: 22 U/L (ref 0–44)
AST: 29 U/L (ref 15–41)
Albumin: 2.5 g/dL — ABNORMAL LOW (ref 3.5–5.0)
Alkaline Phosphatase: 77 U/L (ref 38–126)
Anion gap: 18 — ABNORMAL HIGH (ref 5–15)
BUN: 76 mg/dL — ABNORMAL HIGH (ref 8–23)
CO2: 20 mmol/L — ABNORMAL LOW (ref 22–32)
Calcium: 7.8 mg/dL — ABNORMAL LOW (ref 8.9–10.3)
Chloride: 89 mmol/L — ABNORMAL LOW (ref 98–111)
Creatinine, Ser: 3.78 mg/dL — ABNORMAL HIGH (ref 0.61–1.24)
GFR, Estimated: 15 mL/min — ABNORMAL LOW (ref >60)
Glucose, Bld: 135 mg/dL — ABNORMAL HIGH (ref 70–99)
Potassium: 3.1 mmol/L — ABNORMAL LOW (ref 3.5–5.1)
Sodium: 127 mmol/L — ABNORMAL LOW (ref 135–145)
Total Bilirubin: 0.7 mg/dL (ref 0.3–1.2)
Total Protein: 6 g/dL — ABNORMAL LOW (ref 6.5–8.1)

## 2022-10-16 LAB — LIPASE, BLOOD: Lipase: 35 U/L (ref 11–51)

## 2022-10-16 MED ORDER — SODIUM CHLORIDE 0.9 % IV BOLUS
1000.0000 mL | Freq: Once | INTRAVENOUS | Status: AC
  Administered 2022-10-16: 1000 mL via INTRAVENOUS

## 2022-10-16 NOTE — ED Provider Triage Note (Signed)
Emergency Medicine Provider Triage Evaluation Note  Marcus A Sallade Sr. , a 89 y.o. male  was evaluated in triage.  Pt complains of left flank pain and hematuria that started today. Had lab work at outpatient clinic with Cr 3.8. Per wife, pt has never had Cr this high before. Reports urgency to urinate but no urine is coming out. Last urination around 1pm today and with gross hematuria. No fever, chills, chest pain, abdominal pain, shortness of breath, nausea, vomiting, diarrhea. Eating and drinking ok. No outpatient nephrologist. Bladder scan in triage 72m. Has bilateral LE edema, states better than normal. On Lasix '40mg'$  BID which was recently increased.   Review of Systems  Positive: See HPI Negative: See HPI  Physical Exam  BP (!) 105/52 (BP Location: Right Arm)   Pulse (!) 51   Temp 98.3 F (36.8 C)   Resp 18   SpO2 97%  Gen:   Awake, no distress   Resp:  Normal effort LCTA MSK:   Moves extremities without difficulty  Other:  Neurologically intact, bilateral LE 1+ pitting edema, abdomen soft and non-tender with no rebound, guarding, or peritoneal signs   Medical Decision Making  Medically screening exam initiated at 5:40 PM.  Appropriate orders placed.  Marcus A Lawhorne Sr. was informed that the remainder of the evaluation will be completed by another provider, this initial triage assessment does not replace that evaluation, and the importance of remaining in the ED until their evaluation is complete.     GSuzzette Righter PA-C 10/16/22 1743

## 2022-10-16 NOTE — Assessment & Plan Note (Addendum)
-  Na of 127. hypotonic hyponatremia from acute renal failure.  -has received 1L of fluid in ED with improvement.  Will keep on continuous IV fluid overnight.

## 2022-10-16 NOTE — ED Notes (Signed)
Bladder scan showing 38 mL

## 2022-10-16 NOTE — ED Provider Notes (Addendum)
[facility] Provider Note   CSN: [id number] Arrival date & time: 10/16/22  1632     History  Chief Complaint  Patient presents with   Hematuria   Back Pain    Marcus [name] Sr. is a 89 y.o. male with history of chronic kidney disease, congestive heart failure, presenting to ED with complaint of dehydration and discolored urine.  The patient reports that he had his home torsemide dosing increased last week, from 20 mg twice daily to 40 mg twice daily.  He has been urinating copiously on this medication.  He began to feel dehydrated.  He drinks about 2 L of water daily per his recollection.  His wife reports that his urine became dark yesterday and then decided to come to the ED for further evaluation.  Patient on xarelto for A Flutter  HPI     Home Medications Prior to Admission medications   Medication Sig Start Date End Date Taking? Authorizing Provider  amiodarone (PACERONE) 200 MG tablet Take 1 tablet (200 mg total) by mouth daily. 10/05/22   [place], [name], MD  amLODipine (NORVASC) 10 MG tablet Take 1 tablet (10 mg total) by mouth daily. 05/08/22   [place], [name], MD  busPIRone (BUSPAR) 7.5 MG tablet Take 7.5 mg by mouth 2 (two) times daily. 07/16/22   [provider]  Carboxymethylcellulose Sodium (THERATEARS) 0.25 % SOLN Apply 2 drops to eye in the morning, at noon, in the evening, and at bedtime.    [provider]  Cholecalciferol (VITAMIN D-3) 125 MCG (5000 UT) TABS Take 5,000 Units by mouth daily.    [provider]  docusate sodium (COLACE) 100 MG capsule Take 100 mg by mouth daily as needed for mild constipation.    [provider]  empagliflozin (JARDIANCE) 10 MG TABS tablet Take 1 tablet (10 mg total) by mouth daily before breakfast. 09/28/22   Loel Dubonnet, NP  Eyelid Cleansers (OCUSOFT LID SCRUB EX) Apply 1 application  topically 2 (two) times daily.    [provider]   levothyroxine (SYNTHROID) 137 MCG tablet Take 137 mcg by mouth daily before breakfast.    [provider]  Magnesium 400 MG TABS Take 400 mg by mouth daily.    [provider]  nitroGLYCERIN (NITROSTAT) 0.4 MG SL tablet Place 0.4 mg under the tongue every 5 (five) hours as needed for chest pain. 06/23/22   [provider]  polyethylene glycol (MIRALAX / GLYCOLAX) 17 g packet Take 17 g by mouth daily as needed for mild constipation.    [provider]  torsemide 40 MG TABS Take 80 mg by mouth daily. Patient taking differently: Take 80 mg by mouth daily. Takes 2 '40mg'$  daily 08/20/22   [place], [name], MD  XARELTO 15 MG TABS tablet Take 1 tablet (15 mg total) by mouth daily. 10/05/22   [place], [name], MD      Allergies    Ace inhibitors, Beta adrenergic blockers, and Statins    Review of Systems   Review of Systems  Physical Exam Updated Vital Signs BP 116/64   Pulse 60   Temp (!) 97.5 F (36.4 C) (Axillary)   Resp 16   SpO2 95%  Physical Exam Constitutional:      General: He is not in acute distress. HENT:     Head: Normocephalic and atraumatic.     Comments: Dry mucous membranes Eyes:     Conjunctiva/sclera: Conjunctivae normal.  Pupils: Pupils are equal, round, and reactive to light.  Cardiovascular:     Rate and Rhythm: Normal rate and regular rhythm.  Pulmonary:     Effort: Pulmonary effort is normal. No respiratory distress.  Abdominal:     General: There is no distension.     Tenderness: There is no abdominal tenderness.  Skin:    General: Skin is warm and dry.  Neurological:     General: No focal deficit present.     Mental Status: He is alert. Mental status is at baseline.  Psychiatric:        Mood and Affect: Mood normal.        Behavior: Behavior normal.     ED Results / Procedures / Treatments   Labs (all labs ordered are listed, but only abnormal results are displayed) Labs Reviewed  CBC WITH DIFFERENTIAL/PLATELET -  Abnormal; Notable for the following components:      Result Value   RBC 3.94 (*)    Hemoglobin 12.0 (*)    HCT 34.9 (*)    Neutro Abs 9.4 (*)    Lymphs Abs 0.3 (*)    Abs Immature Granulocytes 0.08 (*)    All other components within normal limits  COMPREHENSIVE METABOLIC PANEL - Abnormal; Notable for the following components:   Sodium 127 (*)    Potassium 3.1 (*)    Chloride 89 (*)    CO2 20 (*)    Glucose, Bld 135 (*)    BUN 76 (*)    Creatinine, Ser 3.78 (*)    Calcium 7.8 (*)    Total Protein 6.0 (*)    Albumin 2.5 (*)    GFR, Estimated 15 (*)    Anion gap 18 (*)    All other components within normal limits  LIPASE, BLOOD  URINALYSIS, ROUTINE W REFLEX MICROSCOPIC  NA AND K (SODIUM & POTASSIUM), RAND UR  CREATININE, URINE, RANDOM    EKG None  Radiology CT ABDOMEN PELVIS WO CONTRAST  Result Date: 10/16/2022 CLINICAL DATA:  Abdominal and flank pain, left-sided back pain, inability to urinate EXAM: CT ABDOMEN AND PELVIS WITHOUT CONTRAST TECHNIQUE: Multidetector CT imaging of the abdomen and pelvis was performed following the standard protocol without IV contrast. RADIATION DOSE REDUCTION: This exam was performed according to the departmental dose-optimization program which includes automated exposure control, adjustment of the mA and/or kV according to patient size and/or use of iterative reconstruction technique. COMPARISON:  06/10/2022 FINDINGS: Lower chest: Trace bilateral pleural effusions. Dependent lower lobe atelectasis. Benign partially calcified 9 mm right middle lobe pulmonary nodule unchanged since 2014. The heart is enlarged, with trace pericardial effusion. Hepatobiliary: High attenuation material within the gallbladder consistent with gallbladder sludge. No evidence of cholecystitis. Unremarkable unenhanced appearance of the liver. Pancreas: Unremarkable unenhanced appearance. Spleen: Unremarkable unenhanced appearance. Adrenals/Urinary Tract: No urinary tract calculi  or obstructive uropathy within either kidney. The adrenals are unremarkable. Stable bladder diverticula and mild bladder wall thickening consistent with chronic bladder outlet obstruction. No filling defects. Stomach/Bowel: No bowel obstruction or ileus. Normal retrocecal appendix. No bowel wall thickening or inflammatory change. Vascular/Lymphatic: 4.1 cm infrarenal abdominal aortic aneurysm again noted. Evaluation of the vascular lumen is limited without IV contrast. Stable aortic atherosclerosis. No pathologic adenopathy. Reproductive: Stable enlargement of the prostate. Other: No free intraperitoneal fluid or free gas. There is nonspecific left-sided retroperitoneal fat stranding extending along the left pelvic sidewall. Musculoskeletal: No acute or destructive bony lesions. Stable lipoma anterior to the left hip. Reconstructed images demonstrate  no additional findings. IMPRESSION: 1. No urinary tract calculi or obstructive uropathy. 2. Mild left retroperitoneal fat stranding, nonspecific. No evidence of hematoma on this unenhanced exam. 3. 4.1 cm infrarenal abdominal aortic aneurysm. Recommend follow-up every 12 months and vascular consultation. Reference: J Am Coll Radiol 6759;16:384-665. 4. Trace bilateral pleural effusions. 5. Cardiomegaly, with trace pericardial effusion. 6. Mild enlargement of the prostate, with evidence of chronic bladder outlet obstruction unchanged. 7.  Aortic Atherosclerosis (ICD10-I70.0). Electronically Signed   By: Randa Ngo M.D.   On: 10/16/2022 19:38    Procedures Procedures    Medications Ordered in ED Medications  sodium chloride 0.9 % bolus 1,000 mL (1,000 mLs Intravenous New Bag/Given 10/16/22 2231)    ED Course/ Medical Decision Making/ A&P Clinical Course as of 10/16/22 2246  Tue Oct 16, 2022  2245 Patient admitted to Dr Flossie Buffy hospitalist. [MT]    Clinical Course User Index [MT] Langston Masker Carola Rhine, MD                             Medical Decision  Making Amount and/or Complexity of Data Reviewed Labs: ordered. Radiology: ordered.  Risk Decision regarding hospitalization.   This patient presents to the ED with concern for dehydration, discolored urine. This involves an extensive number of treatment options, and is a complaint that carries with it a high risk of complications and morbidity.  The differential diagnosis includes AKI versus dehydration versus UTI versus other renal injury versus other  Co-morbidities that complicate the patient evaluation: Patient is on diuretic at high risk of dehydration  Additional history obtained from patient's wife at bedside  External records from outside source obtained and reviewed including echo Oct 2023 with EF 60-65%  I ordered and personally interpreted labs.  The pertinent results include:  Cr 3.78 (double from baseline)< BUN 76, K 3.1, Na 127, hgb 12.0  I ordered imaging studies including ct abdomen pelvis I independently visualized and interpreted imaging which showed mild left nonspecific retroperitoneal stranding; 4.1 cm infrarenal aneurysm  I agree with the radiologist interpretation  I ordered medication including IV fluids for hydration  I have reviewed the patients home medicines and have made adjustments as needed   After the interventions noted above, I reevaluated the patient and found that they have: improved   Dispostion:  After consideration of the diagnostic results and the patients response to treatment, I feel that the patent would benefit from medical admission for AKI.         Final Clinical Impression(s) / ED Diagnoses Final diagnoses:  AKI (acute kidney injury) [facility])    Rx / [place] Orders ED Discharge Orders     None         Phill Steck, Carola Rhine, MD 10/16/22 2216    Wyvonnia Dusky, MD 10/16/22 2223    Wyvonnia Dusky, MD 10/16/22 2246

## 2022-10-16 NOTE — ED Notes (Signed)
Patient transported to Ultrasound 

## 2022-10-16 NOTE — ED Triage Notes (Signed)
Pt sent with abnormal labs, Cr 3.8 and BUN 72. Pt has been having dark urine since Saturday and back pain starting today, worse on left. Also says he cannot urinate, last went at 130 today.

## 2022-10-16 NOTE — ED Notes (Signed)
Rounded on patient, found no patient in room.

## 2022-10-16 NOTE — ED Notes (Signed)
Patient resting denies need at this time.

## 2022-10-17 DIAGNOSIS — I714 Abdominal aortic aneurysm, without rupture, unspecified: Secondary | ICD-10-CM | POA: Diagnosis not present

## 2022-10-17 DIAGNOSIS — I130 Hypertensive heart and chronic kidney disease with heart failure and stage 1 through stage 4 chronic kidney disease, or unspecified chronic kidney disease: Secondary | ICD-10-CM | POA: Diagnosis not present

## 2022-10-17 DIAGNOSIS — I480 Paroxysmal atrial fibrillation: Secondary | ICD-10-CM

## 2022-10-17 DIAGNOSIS — N179 Acute kidney failure, unspecified: Principal | ICD-10-CM | POA: Insufficient documentation

## 2022-10-17 DIAGNOSIS — I10 Essential (primary) hypertension: Secondary | ICD-10-CM | POA: Diagnosis not present

## 2022-10-17 DIAGNOSIS — I5042 Chronic combined systolic (congestive) and diastolic (congestive) heart failure: Secondary | ICD-10-CM | POA: Diagnosis not present

## 2022-10-17 DIAGNOSIS — E872 Acidosis, unspecified: Secondary | ICD-10-CM | POA: Diagnosis not present

## 2022-10-17 DIAGNOSIS — E871 Hypo-osmolality and hyponatremia: Secondary | ICD-10-CM | POA: Diagnosis not present

## 2022-10-17 DIAGNOSIS — E032 Hypothyroidism due to medicaments and other exogenous substances: Secondary | ICD-10-CM

## 2022-10-17 DIAGNOSIS — N1831 Chronic kidney disease, stage 3a: Secondary | ICD-10-CM | POA: Diagnosis not present

## 2022-10-17 DIAGNOSIS — E876 Hypokalemia: Secondary | ICD-10-CM | POA: Diagnosis not present

## 2022-10-17 DIAGNOSIS — I5032 Chronic diastolic (congestive) heart failure: Secondary | ICD-10-CM | POA: Diagnosis not present

## 2022-10-17 DIAGNOSIS — D649 Anemia, unspecified: Secondary | ICD-10-CM | POA: Diagnosis not present

## 2022-10-17 LAB — BASIC METABOLIC PANEL
Anion gap: 13 (ref 5–15)
Anion gap: 13 (ref 5–15)
Anion gap: 15 (ref 5–15)
BUN: 80 mg/dL — ABNORMAL HIGH (ref 8–23)
BUN: 80 mg/dL — ABNORMAL HIGH (ref 8–23)
BUN: 81 mg/dL — ABNORMAL HIGH (ref 8–23)
CO2: 21 mmol/L — ABNORMAL LOW (ref 22–32)
CO2: 22 mmol/L (ref 22–32)
CO2: 23 mmol/L (ref 22–32)
Calcium: 7.6 mg/dL — ABNORMAL LOW (ref 8.9–10.3)
Calcium: 7.6 mg/dL — ABNORMAL LOW (ref 8.9–10.3)
Calcium: 7.9 mg/dL — ABNORMAL LOW (ref 8.9–10.3)
Chloride: 92 mmol/L — ABNORMAL LOW (ref 98–111)
Chloride: 94 mmol/L — ABNORMAL LOW (ref 98–111)
Chloride: 96 mmol/L — ABNORMAL LOW (ref 98–111)
Creatinine, Ser: 3.76 mg/dL — ABNORMAL HIGH (ref 0.61–1.24)
Creatinine, Ser: 3.83 mg/dL — ABNORMAL HIGH (ref 0.61–1.24)
Creatinine, Ser: 3.93 mg/dL — ABNORMAL HIGH (ref 0.61–1.24)
GFR, Estimated: 14 mL/min — ABNORMAL LOW (ref >60)
GFR, Estimated: 14 mL/min — ABNORMAL LOW (ref >60)
GFR, Estimated: 15 mL/min — ABNORMAL LOW (ref >60)
Glucose, Bld: 108 mg/dL — ABNORMAL HIGH (ref 70–99)
Glucose, Bld: 96 mg/dL (ref 70–99)
Glucose, Bld: 96 mg/dL (ref 70–99)
Potassium: 3.2 mmol/L — ABNORMAL LOW (ref 3.5–5.1)
Potassium: 3.2 mmol/L — ABNORMAL LOW (ref 3.5–5.1)
Potassium: 3.2 mmol/L — ABNORMAL LOW (ref 3.5–5.1)
Sodium: 128 mmol/L — ABNORMAL LOW (ref 135–145)
Sodium: 130 mmol/L — ABNORMAL LOW (ref 135–145)
Sodium: 131 mmol/L — ABNORMAL LOW (ref 135–145)

## 2022-10-17 LAB — URINALYSIS, ROUTINE W REFLEX MICROSCOPIC
Bilirubin Urine: NEGATIVE
Glucose, UA: NEGATIVE mg/dL
Ketones, ur: NEGATIVE mg/dL
Nitrite: NEGATIVE
Protein, ur: 100 mg/dL — AB
RBC / HPF: >50 RBC/hpf — ABNORMAL HIGH (ref 0–5)
Specific Gravity, Urine: 1.009 (ref 1.005–1.030)
pH: 5 (ref 5.0–8.0)

## 2022-10-17 LAB — MAGNESIUM: Magnesium: 2.1 mg/dL (ref 1.7–2.4)

## 2022-10-17 LAB — NA AND K (SODIUM & POTASSIUM), RAND UR
Potassium Urine: 29 mmol/L
Sodium, Ur: 32 mmol/L

## 2022-10-17 LAB — CREATININE, URINE, RANDOM: Creatinine, Urine: 63 mg/dL

## 2022-10-17 MED ORDER — LEVOTHYROXINE SODIUM 25 MCG PO TABS
137.0000 ug | ORAL_TABLET | Freq: Every day | ORAL | Status: DC
  Administered 2022-10-17: 137 ug via ORAL
  Filled 2022-10-17: qty 1

## 2022-10-17 MED ORDER — AMLODIPINE BESYLATE 5 MG PO TABS
10.0000 mg | ORAL_TABLET | Freq: Every day | ORAL | Status: DC
  Administered 2022-10-17: 10 mg via ORAL
  Filled 2022-10-17: qty 2

## 2022-10-17 MED ORDER — AMIODARONE HCL 200 MG PO TABS
200.0000 mg | ORAL_TABLET | Freq: Every day | ORAL | Status: DC
  Administered 2022-10-17: 200 mg via ORAL
  Filled 2022-10-17: qty 1

## 2022-10-17 MED ORDER — BUSPIRONE HCL 10 MG PO TABS
5.0000 mg | ORAL_TABLET | Freq: Two times a day (BID) | ORAL | Status: DC
  Administered 2022-10-17: 5 mg via ORAL
  Filled 2022-10-17: qty 1

## 2022-10-17 MED ORDER — LACTATED RINGERS IV SOLN: INTRAVENOUS | Status: DC

## 2022-10-17 MED ORDER — POTASSIUM CHLORIDE 10 MEQ/100ML IV SOLN
10.0000 meq | INTRAVENOUS | Status: AC
  Administered 2022-10-17 (×2): 10 meq via INTRAVENOUS
  Filled 2022-10-17 (×2): qty 100

## 2022-10-17 MED ORDER — MELATONIN 3 MG PO TABS
3.0000 mg | ORAL_TABLET | Freq: Once | ORAL | Status: AC
  Administered 2022-10-17: 3 mg via ORAL
  Filled 2022-10-17: qty 1

## 2022-10-17 NOTE — Assessment & Plan Note (Signed)
CO2 of 20 with anion gap of 18 secondary acute renal failure -improving with fluids, will continue

## 2022-10-17 NOTE — Assessment & Plan Note (Signed)
-  continue amiodarone -holding Xarelto due to low creatinine clearance from acute renal failure

## 2022-10-17 NOTE — ED Notes (Signed)
Blood drawn via 23 gauge butterfly from R arm. Samples obtained and sent to lab
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
## 2022-10-17 NOTE — Assessment & Plan Note (Signed)
-  Presenting creatinine of 3.78 from prior baseline of around 1-2 -from over-diuresis after Torsemide was doubled from '40mg'$  to '80mg'$ . Will hold.  -CT A/P and renal ultrasound without signs of post-obstructive uropathy -continues IV LR and follow BMP daily -avoid nephrotoxic agent -renally dose all medication

## 2022-10-17 NOTE — Assessment & Plan Note (Signed)
-  continue levothyroxine

## 2022-10-17 NOTE — Assessment & Plan Note (Signed)
-  hypovolemic as evidence on exam and acute renal failure -holding diuretic -Last echo on 07/2022 EF of 60 to 65% and grade 2 diastolic dysfunction -Not on beta-blocker due to chronic bradycardia

## 2022-10-17 NOTE — Assessment & Plan Note (Signed)
-  potassium of 3.1. Give IV 42mq x 2.  -check IV Mg and replete as needed

## 2022-10-17 NOTE — Hospital Course (Addendum)
Taken from H&P.  Marcus A Cocking Sr. is a 89 y.o. male with medical history significant of CAD s/p stent, combined systolic and diastolic heart failure, paroxysmal atrial fibrillation on Xarelto, amiodarone, hypertension, CKD 3A, hypothyroidism, myasthenia gravis, emphysema who presents due to abnormal labs with acute renal failure.   He had increase to his Torsemide form '40mg'$  to '80mg'$  and the addition of Jardiance as well on 09/28/22 with cardiology. Has been trying to drink 3L a day but started to get more dehydrated. Urine turned down over the weekend. Had lab work done with PCP today showing acute renal failure and advised to go to ED.    In the ED, he was afebrile heart rate of 50s, BP of 105/52 on room air.   WBC of 10.5, hemoglobin 12.   Sodium of 127, K of 3.1, creatinine of 3.78 from prior baseline of 1-2. CBG of 135.  CO2 of 20.  Anion gap of 18.   CT abdomen pelvis showed mild enlargement of the prostate and chronic outlet bladder obstruction.  Nonspecific mild retroperitoneal fat stranding.  Bladder ultrasound with only 38 cc.   Renal ultrasound showed findings consistent with chronic medical renal disease.  Sodium started improving with IV fluid.  Also found to have hypokalemia which was repleted. Patient received IV fluid.  He has an appointment with EP tomorrow morning for which he was waiting for 6 months and does not want to miss that appointment. He was requesting discharge. Nephrology was also consulted and they advised holding torsemide and Jardiance and he will follow-up with nephrology closely as an outpatient for further recommendations and they can restart home torsemide and Jardiance if appropriate.  His creatinine was 3.76 and BUN of 80 on repeat.  Patient is being discharged at his request so he can follow up with his EP provider.  Also discussed with nephrology and they will have a close follow-up.  He will continue with rest of his home medications except holding  torsemide and Jardiance and need to have a close follow-up with his providers for further recommendations.

## 2022-10-17 NOTE — H&P (Signed)
History and Physical    Patient: Marcus HIRANO Sr. DJS:[id number] DOB: 07-22-1932 DOA: 10/16/2022 DOS: the patient was seen and examined on 10/17/2022 PCP: [name], MD  Patient coming from: Home  Chief Complaint:  Chief Complaint  Patient presents with   Hematuria   Back Pain   HPI: Marcus Kaufmann Sr. is a 89 y.o. male with medical history significant of CAD s/p stent, combined systolic and diastolic heart failure, paroxysmal atrial fibrillation on Xarelto, amiodarone, hypertension, CKD 3A, hypothyroidism, myasthenia gravis, emphysema who presents due to abnormal labs with acute renal failure.  He had increase to his Torsemide form '40mg'$  to '80mg'$  and the addition of Jardiance as well on 09/28/22 with cardiology. Has been trying to drink 3L a day but started to get more dehydrated. Urine turned down over the weekend. Had lab work done with PCP today showing acute renal failure and advised to go to ED.   In the ED, he was afebrile heart rate of 50s, BP of 105/52 on room air.  WBC of 10.5, hemoglobin 12.  Sodium of 127, K of 3.1, creatinine of 3.78 from prior baseline of 1-2. CBG of 135.  CO2 of 20.  Anion gap of 18.  CT abdomen pelvis showed mild enlargement of the prostate and chronic outlet bladder obstruction.  Nonspecific mild retroperitoneal fat stranding.  Bladder ultrasound with only 38 cc.  Renal ultrasound showed findings consistent with chronic medical renal disease. Review of Systems: As mentioned in the history of present illness. All other systems reviewed and are negative. Past Medical History:  Diagnosis Date   Acute on chronic combined systolic and diastolic CHF (congestive heart failure) ([place]) 08/22/2018   Acute on chronic respiratory failure ([place]) 04/15/2015   Acute renal failure ([place]) 04/15/2015   Anemia    Atrial flutter (HCC)    Bradycardia    CAD in native artery 08/22/2018   CAP (community acquired pneumonia) 04/13/2015   See cxr 04/13/2015 > admit  [facility]     Chest pain 08/20/2018   H/O hiatal hernia    Hyperlipidemia LDL goal <70 08/22/2018   Hypertension 08/22/2018   Hypothyroidism 04/13/2015   Myasthenia gravis ([place]) 04/13/2015   Non-ST elevation (NSTEMI) myocardial infarction ([place])    Obesity 04/13/2015   PAF (paroxysmal atrial fibrillation) ([place]) 04/16/2015   RBBB    S/P angioplasty with stent 08/21/18 DES to RCA 08/22/2018   Sepsis ([place]) 04/15/2015   Thrombocytopenia ([place])    Past Surgical History:  Procedure Laterality Date   AMPUTATION  06/11/2012   Procedure: AMPUTATION DIGIT;  Surgeon: [name], MD;  Location: [facility];  Service: Orthopedics;  Laterality: Left;  revision of amputation   CARDIOVERSION N/A 03/24/2020   Procedure: CARDIOVERSION;  Surgeon: [name], [name], MD;  Location: [facility];  Service: Cardiovascular;  Laterality: N/A;   CARDIOVERSION N/A 02/14/2021   Procedure: CARDIOVERSION;  Surgeon: [name], MD;  Location: [place] [facility] [place];  Service: Cardiovascular;  Laterality: N/A;   CARDIOVERSION N/A 11/03/2021   Procedure: CARDIOVERSION;  Surgeon: Buford Dresser, MD;  Location: [facility] ENDOSCOPY;  Service: Cardiovascular;  Laterality: N/A;   CARDIOVERSION N/A 08/09/2022   Procedure: CARDIOVERSION;  Surgeon: Werner Lean, MD;  Location: [facility] ENDOSCOPY;  Service: Cardiovascular;  Laterality: N/A;   CORONARY STENT INTERVENTION N/A 08/21/2018   Procedure: CORONARY STENT INTERVENTION;  Surgeon: [name], MD;  Location: [facility];  Service: Cardiovascular;  Laterality: N/A;   CORONARY STENT INTERVENTION N/A 10/13/2021   Procedure:  CORONARY STENT INTERVENTION;  Surgeon: Burnell Blanks, MD;  Location: [facility];  Service: Cardiovascular;  Laterality: N/A;   HERNIA REPAIR  1994   IR ANGIO INTRA EXTRACRAN SEL COM CAROTID INNOMINATE BILAT MOD SED  06/14/2022   LEFT HEART CATH AND CORONARY ANGIOGRAPHY N/A 08/21/2018   Procedure: LEFT HEART CATH AND CORONARY  ANGIOGRAPHY;  Surgeon: [name], MD;  Location: [facility];  Service: Cardiovascular;  Laterality: N/A;   LEFT HEART CATH AND CORONARY ANGIOGRAPHY N/A 10/13/2021   Procedure: LEFT HEART CATH AND CORONARY ANGIOGRAPHY;  Surgeon: Burnell Blanks, MD;  Location: [facility];  Service: Cardiovascular;  Laterality: N/A;   ORIF SHOULDER FRACTURE  06/13/2012   Procedure: OPEN REDUCTION INTERNAL FIXATION (ORIF) SHOULDER FRACTURE;  Surgeon: [name], MD;  Location: [facility];  Service: Orthopedics;  Laterality: Left;  LEFT SHOULDER OPEN GREATER TUBEROSITY ORIF   RIGHT/LEFT HEART CATH AND CORONARY ANGIOGRAPHY N/A 05/11/2022   Procedure: RIGHT/LEFT HEART CATH AND CORONARY ANGIOGRAPHY;  Surgeon: Martinique, [name], MD;  Location: [facility];  Service: Cardiovascular;  Laterality: N/A;   SHOULDER CLOSED REDUCTION  06/11/2012   Procedure: CLOSED MANIPULATION SHOULDER;  Surgeon: [name], MD;  Location: [facility];  Service: Orthopedics;  Laterality: Left;   Social History:  reports that he quit smoking about 40 years ago. His smoking use included cigarettes. He has a 30.00 pack-year smoking history. He has never used smokeless tobacco. He reports that he does not currently use alcohol after a past usage of about 2.0 standard drinks of alcohol per week. He reports that he does not use drugs.  Allergies  Allergen Reactions   Ace Inhibitors Hives   Beta Adrenergic Blockers Hives   Statins Hives    Family History  Problem Relation Age of Onset   Other Mother        Deceased, 74   Heart attack Father        Deceased, 27   Healthy Sister    Healthy Daughter        x 4   Healthy Son        x 3    Prior to Admission medications   Medication Sig Start Date End Date Taking? Authorizing Provider  amiodarone (PACERONE) 200 MG tablet Take 1 tablet (200 mg total) by mouth daily. 10/05/22   Martinique, [name], MD  amLODipine (NORVASC) 10 MG tablet Take 1 tablet (10 mg total) by  mouth daily. 05/08/22   Martinique, [name], MD  busPIRone (BUSPAR) 7.5 MG tablet Take 7.5 mg by mouth 2 (two) times daily. 07/16/22   [provider]  Carboxymethylcellulose Sodium (THERATEARS) 0.25 % SOLN Apply 2 drops to eye in the morning, at noon, in the evening, and at bedtime.    [provider]  Cholecalciferol (VITAMIN D-3) 125 MCG (5000 UT) TABS Take 5,000 Units by mouth daily.    [provider]  docusate sodium (COLACE) 100 MG capsule Take 100 mg by mouth daily as needed for mild constipation.    [provider]  empagliflozin (JARDIANCE) 10 MG TABS tablet Take 1 tablet (10 mg total) by mouth daily before breakfast. 09/28/22   Loel Dubonnet, NP  Eyelid Cleansers (OCUSOFT LID SCRUB EX) Apply 1 application  topically 2 (two) times daily.    [provider]  levothyroxine (SYNTHROID) 137 MCG tablet Take 137 mcg by mouth daily before breakfast.    [provider]  Magnesium 400 MG TABS Take 400 mg by mouth daily.    [provider]  nitroGLYCERIN (NITROSTAT) 0.4 MG SL tablet Place 0.4 mg under the tongue every 5 (five) hours as needed for chest pain. 06/23/22   [provider]  polyethylene glycol (MIRALAX / GLYCOLAX) 17 g packet Take 17 g by mouth daily as needed for mild constipation.    [provider]  torsemide 40 MG TABS Take 80 mg by mouth daily. Patient taking differently: Take 80 mg by mouth daily. Takes 2 '40mg'$  daily 08/20/22   Martinique, [name], MD  XARELTO 15 MG TABS tablet Take 1 tablet (15 mg total) by mouth daily. 10/05/22   Martinique, [name], MD    Physical Exam: Vitals:   10/16/22 2130 10/16/22 2200 10/16/22 2230 10/16/22 2340  BP: 131/60 119/68 116/64 123/77  Pulse: (!) 54 61 60 62  Resp:    16  Temp:      TempSrc:      SpO2: 97% 97% 95% 95%   Constitutional: NAD, calm, comfortable, chronically ill-appearing elderly male laying flat in bed.  Has hearing impairment. Eyes: lids and conjunctivae  normal ENMT: Mucous membranes are moist.  Neck: normal, supple Respiratory: clear to auscultation bilaterally, no wheezing, no crackles. Normal respiratory effort. No accessory muscle use.  Cardiovascular: Regular rate and rhythm, no murmurs / rubs / gallops.  Trace +1 pitting edema of the right distal lower extremity.   Abdomen: no tenderness,  Bowel sounds positive.  Musculoskeletal: no clubbing / cyanosis. No joint deformity upper and lower extremities.  Skin: Scattered ecchymosis on lower extremity Neurologic: CN 2-12 grossly intact.  Strength 5/5 in all 4.  Psychiatric: Normal judgment and insight. Alert and oriented x 3. Normal mood. Data Reviewed:  See HPI   Assessment and Plan: * Acute renal failure superimposed on stage 3a chronic kidney disease ([place]) -Presenting creatinine of 3.78 from prior baseline of around 1-2 -from over-diuresis after Torsemide was doubled from '40mg'$  to '80mg'$ . Will hold.  -CT A/P and renal ultrasound without signs of post-obstructive uropathy -continues IV LR and follow BMP daily -avoid nephrotoxic agent -renally dose all medication   Hypothyroidism -continue levothyroxine  Abdominal aortic aneurysm (AAA) (HCC) 4.1 cm noted on CT A/P. Recommend repeat imaging in 12 months with vascular consult outpatient.  HTN (hypertension) -continue amlodipine  Metabolic acidosis CO2 of 20 with anion gap of 18 secondary acute renal failure -improving with fluids, will continue  Hypokalemia -potassium of 3.1. Give IV 39mq x 2.  -check IV Mg and replete as needed  Hyponatremia -Na of 127. hypotonic hyponatremia from acute renal failure.  -has received 1L of fluid in ED with improvement.  Will keep on continuous IV fluid overnight.  Chronic combined systolic and diastolic heart failure (HCC) -hypovolemic as evidence on exam and acute renal failure -holding diuretic -Last echo on 07/2022 EF of 60 to 65% and grade 2 diastolic dysfunction -Not on beta-blocker  due to chronic bradycardia  Paroxysmal atrial fibrillation (HCC) -continue amiodarone -holding Xarelto due to low creatinine clearance from acute renal failure      Advance Care Planning:   Code Status: DNR   Consults: none  Family Communication: none at bedside  Severity of Illness: The appropriate patient status for this patient is INPATIENT. Inpatient status is judged to be reasonable and necessary in order to provide the required intensity of service to ensure the patient's safety. The patient's presenting symptoms, physical exam findings, and initial radiographic and laboratory  data in the context of their chronic comorbidities is felt to place them at high risk for further clinical deterioration. Furthermore, it is not anticipated that the patient will be medically stable for discharge from the hospital within 2 midnights of admission.   * I certify that at the point of admission it is my clinical judgment that the patient will require inpatient hospital care spanning beyond 2 midnights from the point of admission due to high intensity of service, high risk for further deterioration and high frequency of surveillance required.*  Author: Orene Desanctis, DO 10/17/2022 1:08 AM  For on call review [email or web address].

## 2022-10-17 NOTE — Care Management Obs Status (Signed)
[facility] NOTIFICATION   Patient Details  Name: Marcus PRESIDENT Sr. MRN: [id number] Date of Birth: 07/07/1932   Medicare Observation Status Notification Given:  Yes    Fuller Mandril, RN 10/17/2022, 1:50 PM

## 2022-10-17 NOTE — Care Management CC44 (Signed)
Condition Code 44 Documentation Completed  Patient Details  Name: Marcus A Johnstone Sr. MRN: [id number] Date of Birth: 1932-08-22   Condition Code 44 given:  Yes Patient signature on Condition Code 44 notice:  Yes Documentation of 2 MD's agreement:  Yes Code 44 added to claim:  Yes    Fuller Mandril, RN 10/17/2022, 1:50 PM

## 2022-10-17 NOTE — Assessment & Plan Note (Signed)
-  continue amlodipine. 

## 2022-10-17 NOTE — Discharge Summary (Signed)
Physician Discharge Summary   Patient: Marcus Kaufmann Sr. MRN: [id number] DOB: [birth year]  Admit date:     10/16/2022  Discharge date: 10/17/22  Discharge Physician: Lorella Nimrod   PCP: [name], MD   Recommendations at discharge:  Please obtain renal function within next 2 days. Follow-up with nephrology Follow-up with primary care provider  Discharge Diagnoses: Principal Problem:   Acute renal failure superimposed on stage 3a chronic kidney disease ([place]) Active Problems:   Hypothyroidism   Abdominal aortic aneurysm (AAA) (HCC)   Paroxysmal atrial fibrillation (HCC)   Chronic combined systolic and diastolic heart failure (HCC)   Hyponatremia   Hypokalemia   Metabolic acidosis   HTN (hypertension)   AKI (acute kidney injury) [facility])   Hospital Course: Taken from H&P.  Marcus A Geeslin Sr. is a 89 y.o. male with medical history significant of CAD s/p stent, combined systolic and diastolic heart failure, paroxysmal atrial fibrillation on Xarelto, amiodarone, hypertension, CKD 3A, hypothyroidism, myasthenia gravis, emphysema who presents due to abnormal labs with acute renal failure.   He had increase to his Torsemide form '40mg'$  to '80mg'$  and the addition of Jardiance as well on 09/28/22 with cardiology. Has been trying to drink 3L a day but started to get more dehydrated. Urine turned down over the weekend. Had lab work done with PCP today showing acute renal failure and advised to go to ED.    In the ED, he was afebrile heart rate of 50s, BP of 105/52 on room air.   WBC of 10.5, hemoglobin 12.   Sodium of 127, K of 3.1, creatinine of 3.78 from prior baseline of 1-2. CBG of 135.  CO2 of 20.  Anion gap of 18.   CT abdomen pelvis showed mild enlargement of the prostate and chronic outlet bladder obstruction.  Nonspecific mild retroperitoneal fat stranding.  Bladder ultrasound with only 38 cc.   Renal ultrasound showed findings consistent with chronic medical renal  disease.  Sodium started improving with IV fluid.  Also found to have hypokalemia which was repleted. Patient received IV fluid.  He has an appointment with EP tomorrow morning for which he was waiting for 6 months and does not want to miss that appointment. He was requesting discharge. Nephrology was also consulted and they advised holding torsemide and Jardiance and he will follow-up with nephrology closely as an outpatient for further recommendations and they can restart home torsemide and Jardiance if appropriate.  His creatinine was 3.76 and BUN of 80 on repeat.  Patient is being discharged at his request so he can follow up with his EP provider.  Also discussed with nephrology and they will have a close follow-up.  He will continue with rest of his home medications except holding torsemide and Jardiance and need to have a close follow-up with his providers for further recommendations.    Assessment and Plan: * Acute renal failure superimposed on stage 3a chronic kidney disease ([place]) -Presenting creatinine of 3.78 from prior baseline of around 1-2 -from over-diuresis after Torsemide was doubled from '40mg'$  to '80mg'$ . Will hold.  -CT A/P and renal ultrasound without signs of post-obstructive uropathy -continues IV LR and follow BMP daily -avoid nephrotoxic agent -renally dose all medication   Hypothyroidism -continue levothyroxine  Abdominal aortic aneurysm (AAA) (HCC) 4.1 cm noted on CT A/P. Recommend repeat imaging in 12 months with vascular consult outpatient.  HTN (hypertension) -continue amlodipine  Metabolic acidosis CO2 of 20 with anion gap of 18 secondary acute renal failure -improving  with fluids, will continue  Hypokalemia -potassium of 3.1. Give IV 88mq x 2.  -check IV Mg and replete as needed  Hyponatremia -Na of 127. hypotonic hyponatremia from acute renal failure.  -has received 1L of fluid in ED with improvement.  Will keep on continuous IV fluid  overnight.  Chronic combined systolic and diastolic heart failure (HCC) -hypovolemic as evidence on exam and acute renal failure -holding diuretic -Last echo on 07/2022 EF of 60 to 65% and grade 2 diastolic dysfunction -Not on beta-blocker due to chronic bradycardia  [place] -continue amiodarone -holding Xarelto due to low creatinine clearance from acute renal failure   Consultants: Nephrology Procedures performed: None Disposition: Home Diet recommendation:  Discharge Diet Orders (From admission, onward)     Start     Ordered   10/17/22 0000  Diet - low sodium heart healthy        10/17/22 1532           Cardiac diet DISCHARGE MEDICATION: Allergies as of 10/17/2022       Reactions   Ace Inhibitors Hives   Beta Adrenergic Blockers Hives   Statins Hives        Medication List     STOP taking these medications    empagliflozin 10 MG Tabs tablet Commonly known as: Jardiance   Torsemide 40 MG Tabs       TAKE these medications    amiodarone 200 MG tablet Commonly known as: PACERONE Take 1 tablet (200 mg total) by mouth daily.   amLODipine 10 MG tablet Commonly known as: NORVASC Take 1 tablet (10 mg total) by mouth daily.   busPIRone 7.5 MG tablet Commonly known as: BUSPAR Take 7.5 mg by mouth 2 (two) times daily.   docusate sodium 100 MG capsule Commonly known as: COLACE Take 100 mg by mouth daily as needed for mild constipation.   levothyroxine 137 MCG tablet Commonly known as: SYNTHROID Take 137 mcg by mouth daily before breakfast.   Magnesium 400 MG Tabs Take 400 mg by mouth daily.   nitroGLYCERIN 0.4 MG SL tablet Commonly known as: NITROSTAT Place 0.4 mg under the tongue every 5 (five) hours as needed for chest pain.   polyethylene glycol 17 g packet Commonly known as: MIRALAX / GLYCOLAX Take 17 g by mouth daily as needed for mild constipation.   Theratears 0.25 % Soln Generic drug:  Carboxymethylcellulose Sodium Apply 2 drops to eye in the morning, at noon, in the evening, and at bedtime.   Vitamin D-3 125 MCG (5000 UT) Tabs Take 5,000 Units by mouth daily.   Xarelto 15 MG Tabs tablet Generic drug: Rivaroxaban Take 1 tablet (15 mg total) by mouth daily.        Follow-up Information     SReynold Bowen MD. Schedule an appointment as soon as possible for a visit in 1 week(s).   Specialty: Endocrinology Contact information: [phone]         CDonato Heinz MD. Schedule an appointment as soon as possible for a visit.   Specialty: Nephrology Contact information: [phone]               Discharge Exam: There were no vitals filed for this visit. General.  Frail elderly man, in no acute distress. Pulmonary.  Lungs clear bilaterally, normal respiratory effort. CV.  Regular rate and rhythm, no JVD, rub or murmur. Abdomen.  Soft, nontender, nondistended, BS positive.  CNS.  Alert and oriented .  No focal neurologic deficit. Extremities.  No edema, no cyanosis, pulses intact and symmetrical. Psychiatry.  Judgment and insight appears normal.   Condition at discharge: stable  The results of significant diagnostics from this hospitalization (including imaging, microbiology, ancillary and laboratory) are listed below for reference.   Imaging Studies: US Renal  Result Date: 10/16/2022 CLINICAL DATA:  Acute kidney injury EXAM: RENAL / URINARY TRACT ULTRASOUND COMPLETE COMPARISON:  None Available. FINDINGS: Right Kidney: Renal measurements: 9.8 x 5.6 x 5.8 cm = volume: 167 mL. Mildly increased echotexture. No mass or hydronephrosis. Left Kidney: Renal measurements: 10.7 x 5.9 x 4.9 cm = volume: 161 mL. Mildly increased echotexture. No mass or hydronephrosis. Bladder: Appears normal for degree of bladder distention. Other: None. IMPRESSION: Mildly increased echotexture  suggesting chronic medical renal disease. No acute findings. Electronically Signed   By: Rolm Baptise M.D.   On: 10/16/2022 23:04   CT ABDOMEN PELVIS WO CONTRAST  Result Date: 10/16/2022 CLINICAL DATA:  Abdominal and flank pain, left-sided back pain, inability to urinate EXAM: CT ABDOMEN AND PELVIS WITHOUT CONTRAST TECHNIQUE: Multidetector CT imaging of the abdomen and pelvis was performed following the standard protocol without IV contrast. RADIATION DOSE REDUCTION: This exam was performed according to the departmental dose-optimization program which includes automated exposure control, adjustment of the mA and/or kV according to patient size and/or use of iterative reconstruction technique. COMPARISON:  06/10/2022 FINDINGS: Lower chest: Trace bilateral pleural effusions. Dependent lower lobe atelectasis. Benign partially calcified 9 mm right middle lobe pulmonary nodule unchanged since 2014. The heart is enlarged, with trace pericardial effusion. Hepatobiliary: High attenuation material within the gallbladder consistent with gallbladder sludge. No evidence of cholecystitis. Unremarkable unenhanced appearance of the liver. Pancreas: Unremarkable unenhanced appearance. Spleen: Unremarkable unenhanced appearance. Adrenals/Urinary Tract: No urinary tract calculi or obstructive uropathy within either kidney. The adrenals are unremarkable. Stable bladder diverticula and mild bladder wall thickening consistent with chronic bladder outlet obstruction. No filling defects. Stomach/Bowel: No bowel obstruction or ileus. Normal retrocecal appendix. No bowel wall thickening or inflammatory change. Vascular/Lymphatic: 4.1 cm infrarenal abdominal aortic aneurysm again noted. Evaluation of the vascular lumen is limited without IV contrast. Stable aortic atherosclerosis. No pathologic adenopathy. Reproductive: Stable enlargement of the prostate. Other: No free intraperitoneal fluid or free gas. There is nonspecific left-sided  retroperitoneal fat stranding extending along the left pelvic sidewall. Musculoskeletal: No acute or destructive bony lesions. Stable lipoma anterior to the left hip. Reconstructed images demonstrate no additional findings. IMPRESSION: 1. No urinary tract calculi or obstructive uropathy. 2. Mild left retroperitoneal fat stranding, nonspecific. No evidence of hematoma on this unenhanced exam. 3. 4.1 cm infrarenal abdominal aortic aneurysm. Recommend follow-up every 12 months and vascular consultation. Reference: J Am Coll Radiol 6606;30:160-109. 4. Trace bilateral pleural effusions. 5. Cardiomegaly, with trace pericardial effusion. 6. Mild enlargement of the prostate, with evidence of chronic bladder outlet obstruction unchanged. 7.  Aortic Atherosclerosis (ICD10-I70.0). Electronically Signed   By: Randa Ngo M.D.   On: 10/16/2022 19:38    Microbiology: Results for orders placed or performed during the hospital encounter of 06/10/22  Resp Panel by RT-PCR (Flu A&B, Covid) Anterior Nasal Swab     Status: None   Collection Time: 06/10/22  5:17 PM   Specimen: Anterior Nasal Swab  Result Value Ref Range Status   SARS Coronavirus 2 by RT PCR NEGATIVE NEGATIVE Final    Comment: (NOTE) SARS-CoV-2 target nucleic acids are NOT DETECTED.  The SARS-CoV-2 RNA is  generally detectable in upper respiratory specimens during the acute phase of infection. The lowest concentration of SARS-CoV-2 viral copies this assay can detect is 138 copies/mL. A negative result does not preclude SARS-Cov-2 infection and should not be used as the sole basis for treatment or other patient management decisions. A negative result may occur with  improper specimen collection/handling, submission of specimen other than nasopharyngeal swab, presence of viral mutation(s) within the areas targeted by this assay, and inadequate number of viral copies(<138 copies/mL). A negative result must be combined with clinical observations,  patient history, and epidemiological information. The expected result is Negative.  Fact Sheet for Patients:  EntrepreneurPulse.com.au  Fact Sheet for Healthcare Providers:  IncredibleEmployment.be  This test is no t yet approved or cleared by the [place] FDA and  has been authorized for detection and/or diagnosis of SARS-CoV-2 by FDA under an Emergency Use Authorization (EUA). This EUA will remain  in effect (meaning this test can be used) for the duration of the COVID-19 declaration under Section 564(b)(1) of the Act, 21 U.S.C.section 360bbb-3(b)(1), unless the authorization is terminated  or revoked sooner.       Influenza A by PCR NEGATIVE NEGATIVE Final   Influenza B by PCR NEGATIVE NEGATIVE Final    Comment: (NOTE) The Xpert Xpress SARS-CoV-2/FLU/RSV plus assay is intended as an aid in the diagnosis of influenza from Nasopharyngeal swab specimens and should not be used as a sole basis for treatment. Nasal washings and aspirates are unacceptable for Xpert Xpress SARS-CoV-2/FLU/RSV testing.  Fact Sheet for Patients: EntrepreneurPulse.com.au  Fact Sheet for Healthcare Providers: IncredibleEmployment.be  This test is not yet approved or cleared by the [place] FDA and has been authorized for detection and/or diagnosis of SARS-CoV-2 by FDA under an Emergency Use Authorization (EUA). This EUA will remain in effect (meaning this test can be used) for the duration of the COVID-19 declaration under Section 564(b)(1) of the Act, 21 U.S.C. section 360bbb-3(b)(1), unless the authorization is terminated or revoked.  Performed at [facility], [place] [address] [place], [place] [address]   Surgical PCR screen     Status: None   Collection Time: 06/13/22  8:07 PM   Specimen: Nasal Mucosa; Nasal Swab  Result Value Ref Range Status   MRSA, PCR NEGATIVE NEGATIVE Final   Staphylococcus aureus  NEGATIVE NEGATIVE Final    Comment: (NOTE) The Xpert SA Assay (FDA approved for NASAL specimens in patients 33 years of age and older), is one component of a comprehensive surveillance program. It is not intended to diagnose infection nor to guide or monitor treatment. Performed at [facility], [place] [address], [place], [place] [address]     Labs: CBC: Recent Labs  Lab 10/16/22 1754  WBC 10.5  NEUTROABS 9.4*  HGB 12.0*  HCT 34.9*  MCV 88.6  PLT 967   Basic Metabolic Panel: Recent Labs  Lab 10/16/22 1754 10/16/22 2338 10/17/22 0509 10/17/22 1400  NA 127* 130* 131* 128*  K 3.1* 3.2* 3.2* 3.2*  CL 89* 94* 96* 92*  CO2 20* 23 22 21*  GLUCOSE 135* 96 96 108*  BUN 76* 81* 80* 80*  CREATININE 3.78* 3.93* 3.83* 3.76*  CALCIUM 7.8* 7.6* 7.6* 7.9*  MG  --  2.1  --   --    Liver Function Tests: Recent Labs  Lab 10/16/22 1754  AST 29  ALT 22  ALKPHOS 77  BILITOT 0.7  PROT 6.0*  ALBUMIN 2.5*   CBG: No results  for input(s): "GLUCAP" in the last 168 hours.  Discharge time spent: greater than 30 minutes.  This record has been created using Systems analyst. Errors have been sought and corrected,but may not always be located. Such creation errors do not reflect on the standard of care.   Signed: Lorella Nimrod, MD [facility] 10/17/2022

## 2022-10-17 NOTE — Assessment & Plan Note (Signed)
4.1 cm noted on CT A/P. Recommend repeat imaging in 12 months with vascular consult outpatient.

## 2022-10-17 NOTE — ED Notes (Signed)
Pt states that he is ready to go home, pt verbalized understanding d/c and follow up, pt from dpt via wc with sig other

## 2022-10-18 ENCOUNTER — Encounter: Payer: Self-pay | Admitting: Cardiology

## 2022-10-18 ENCOUNTER — Encounter: Payer: Self-pay | Admitting: *Deleted

## 2022-10-18 ENCOUNTER — Ambulatory Visit: Payer: Medicare Other | Attending: Cardiology | Admitting: Cardiology

## 2022-10-18 VITALS — BP 116/68 | HR 52 | Ht 68.0 in | Wt 163.0 lb

## 2022-10-18 DIAGNOSIS — I483 Typical atrial flutter: Secondary | ICD-10-CM | POA: Insufficient documentation

## 2022-10-18 DIAGNOSIS — Z79899 Other long term (current) drug therapy: Secondary | ICD-10-CM

## 2022-10-18 DIAGNOSIS — D6869 Other thrombophilia: Secondary | ICD-10-CM | POA: Insufficient documentation

## 2022-10-18 DIAGNOSIS — Z01812 Encounter for preprocedural laboratory examination: Secondary | ICD-10-CM

## 2022-10-18 DIAGNOSIS — I4819 Other persistent atrial fibrillation: Secondary | ICD-10-CM

## 2022-10-18 MED ORDER — AMIODARONE HCL 200 MG PO TABS: ORAL_TABLET | ORAL | 3 refills | Status: AC
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
## 2022-10-18 NOTE — Addendum Note (Signed)
Addended by: Stanton Kidney on: 10/18/2022 10:18 AM   Modules accepted: Orders

## 2022-10-18 NOTE — Patient Instructions (Addendum)
Medication Instructions:  Your physician has recommended you make the following change in your medication: INCREASE Amiodarone to 200 mg TWICE  a day for one month, then return to normal daily dosing of one tablet ONCE a day  *If you need a refill on your cardiac medications before your next appointment, please call your pharmacy*   Lab Work: None ordered   Testing/Procedures: Your physician has recommended that you have a Cardioversion (DCCV). Electrical Cardioversion uses a jolt of electricity to your heart either through paddles or wired patches attached to your chest. This is a controlled, usually prescheduled, procedure. Defibrillation is done under light anesthesia in the hospital, and you usually go home the day of the procedure. This is done to get your heart back into a normal rhythm. You are not awake for the procedure. Please see the instruction sheet given to you today.   Follow-Up: At [facility], you and your health needs are our priority.  As part of our continuing mission to provide you with exceptional heart care, we have created designated Provider Care Teams.  These Care Teams include your primary Cardiologist (physician) and Advanced Practice Providers (APPs -  Physician Assistants and Nurse Practitioners) who all work together to provide you with the care you need, when you need it.   Your next appointment:   3 week(s)  The format for your next appointment:   In Person  Provider:   You will follow up in the [facility] located at [facility]. Your provider will be: Roderic Palau, NP or Clint R. Fenton, PA-C    Thank you for choosing [facility]!!   Trinidad Curet, RN [phone]  Other Instructions

## 2022-10-22 ENCOUNTER — Telehealth: Payer: Self-pay | Admitting: Cardiology

## 2022-10-22 DIAGNOSIS — N401 Enlarged prostate with lower urinary tract symptoms: Secondary | ICD-10-CM | POA: Diagnosis not present

## 2022-10-22 DIAGNOSIS — J439 Emphysema, unspecified: Secondary | ICD-10-CM | POA: Diagnosis not present

## 2022-10-22 DIAGNOSIS — I5031 Acute diastolic (congestive) heart failure: Secondary | ICD-10-CM | POA: Diagnosis not present

## 2022-10-22 DIAGNOSIS — N1831 Chronic kidney disease, stage 3a: Secondary | ICD-10-CM | POA: Diagnosis not present

## 2022-10-22 DIAGNOSIS — Z9181 History of falling: Secondary | ICD-10-CM | POA: Diagnosis not present

## 2022-10-22 DIAGNOSIS — M8580 Other specified disorders of bone density and structure, unspecified site: Secondary | ICD-10-CM | POA: Diagnosis not present

## 2022-10-22 DIAGNOSIS — D696 Thrombocytopenia, unspecified: Secondary | ICD-10-CM | POA: Diagnosis not present

## 2022-10-22 DIAGNOSIS — Z955 Presence of coronary angioplasty implant and graft: Secondary | ICD-10-CM | POA: Diagnosis not present

## 2022-10-22 DIAGNOSIS — I4892 Unspecified atrial flutter: Secondary | ICD-10-CM | POA: Diagnosis not present

## 2022-10-22 DIAGNOSIS — I4891 Unspecified atrial fibrillation: Secondary | ICD-10-CM | POA: Diagnosis not present

## 2022-10-22 DIAGNOSIS — E871 Hypo-osmolality and hyponatremia: Secondary | ICD-10-CM | POA: Diagnosis not present

## 2022-10-22 DIAGNOSIS — I252 Old myocardial infarction: Secondary | ICD-10-CM | POA: Diagnosis not present

## 2022-10-22 DIAGNOSIS — I130 Hypertensive heart and chronic kidney disease with heart failure and stage 1 through stage 4 chronic kidney disease, or unspecified chronic kidney disease: Secondary | ICD-10-CM | POA: Diagnosis not present

## 2022-10-22 DIAGNOSIS — F418 Other specified anxiety disorders: Secondary | ICD-10-CM | POA: Diagnosis not present

## 2022-10-22 DIAGNOSIS — I2510 Atherosclerotic heart disease of native coronary artery without angina pectoris: Secondary | ICD-10-CM | POA: Diagnosis not present

## 2022-10-22 DIAGNOSIS — N39498 Other specified urinary incontinence: Secondary | ICD-10-CM | POA: Diagnosis not present

## 2022-10-22 DIAGNOSIS — E039 Hypothyroidism, unspecified: Secondary | ICD-10-CM | POA: Diagnosis not present

## 2022-10-22 DIAGNOSIS — G7000 Myasthenia gravis without (acute) exacerbation: Secondary | ICD-10-CM | POA: Diagnosis not present

## 2022-10-22 DIAGNOSIS — Z7901 Long term (current) use of anticoagulants: Secondary | ICD-10-CM | POA: Diagnosis not present

## 2022-10-22 NOTE — Telephone Encounter (Signed)
*  STAT* If patient is at the pharmacy, call can be transferred to refill team.   1. Which medications need to be refilled? (please list name of each medication and dose if known)   amiodarone (PACERONE) 200 MG tablet   2. Which pharmacy/location (including street and city if local pharmacy) is medication to be sent to?  [place],  - [address] AT [place]   3. Do they need a 30 day or 90 day supply?   90 days  Patient states he will be out of this medication tomorrow.

## 2022-10-23 NOTE — Telephone Encounter (Signed)
Called pt regarding Amiodarone refill. Pt was advise that pharmacy stated he picked up a refill on 10/07/22 and he has refills on file. Pt stated he does not remember picking up the medication and only has three pills left. Pt was advise to go to pharmacy to refill medication, but his insurance may not cover it because he's picking up early. Pt stated he understood. Pt questions and concerns were address

## 2022-10-23 NOTE — Anesthesia Preprocedure Evaluation (Signed)
Anesthesia Evaluation  Patient identified by MRN, date of birth, ID band Patient awake    Reviewed: Allergy & Precautions, NPO status , Patient's Chart, lab work & pertinent test results  Airway Mallampati: II  TM Distance: >3 FB Neck ROM: Full    Dental  (+) Poor Dentition, Dental Advisory Given   Pulmonary asthma , COPD, former smoker   Pulmonary exam normal breath sounds clear to auscultation       Cardiovascular hypertension, pulmonary hypertension+ angina  + CAD, + Past MI, + Cardiac Stents and +CHF  Normal cardiovascular exam+ dysrhythmias (xarelto) Atrial Fibrillation + Valvular Problems/Murmurs (mod AS) AS  Rhythm:Irregular Rate:Normal  TTE 2023  1. Left ventricular ejection fraction, by estimation, is 60 to 65%. The  left ventricle has normal function. The left ventricle demonstrates  regional wall motion abnormalities (see scoring diagram/findings for  description). There is mild left ventricular   hypertrophy. Left ventricular diastolic parameters are consistent with  Grade II diastolic dysfunction (pseudonormalization).   2. Right ventricular systolic function is normal. The right ventricular  size is mildly enlarged. There is moderately elevated pulmonary artery  systolic pressure. The estimated right ventricular systolic pressure is  12.4 mmHg.   3. Left atrial size was severely dilated.   4. Right atrial size was mildly dilated.   5. The mitral valve is normal in structure. No evidence of mitral valve  regurgitation. No evidence of mitral stenosis.   6. Tricuspid valve regurgitation is moderate to severe.   7. The aortic valve is calcified. There is moderate calcification of the  aortic valve. There is moderate thickening of the aortic valve. Aortic  valve regurgitation is mild. Moderate aortic valve stenosis. Aortic valve  mean gradient measures 18.0 mmHg.  Aortic valve Vmax measures 2.84 m/s.   8. The inferior  vena cava is normal in size with greater than 50%  respiratory variability, suggesting right atrial pressure of 3 mmHg     Neuro/Psych  PSYCHIATRIC DISORDERS Anxiety     negative neurological ROS     GI/Hepatic negative GI ROS, Neg liver ROS,,,  Endo/Other  Hypothyroidism    Renal/GU Renal disease  negative genitourinary   Musculoskeletal negative musculoskeletal ROS (+)    Abdominal   Peds  Hematology negative hematology ROS (+)   Anesthesia Other Findings Myasthenia Gravis  Reproductive/Obstetrics                             Anesthesia Physical Anesthesia Plan  ASA: 3  Anesthesia Plan: General   Post-op Pain Management:    Induction: Intravenous  PONV Risk Score and Plan: Propofol infusion and Treatment may vary due to age or medical condition  Airway Management Planned: Natural Airway  Additional Equipment:   Intra-op Plan:   Post-operative Plan:   Informed Consent: I have reviewed the patients History and Physical, chart, labs and discussed the procedure including the risks, benefits and alternatives for the proposed anesthesia with the patient or authorized representative who has indicated his/her understanding and acceptance.     Dental advisory given  Plan Discussed with: CRNA  Anesthesia Plan Comments:        Anesthesia Quick Evaluation

## 2022-10-24 ENCOUNTER — Other Ambulatory Visit: Payer: Self-pay

## 2022-10-24 ENCOUNTER — Ambulatory Visit (HOSPITAL_COMMUNITY)
Admission: RE | Admit: 2022-10-24 | Discharge: 2022-10-24 | Disposition: A | Discharge status: Home / Self Care | Payer: Medicare Other | Source: Ambulatory Visit | Attending: Cardiology | Admitting: Cardiology

## 2022-10-24 ENCOUNTER — Ambulatory Visit (HOSPITAL_BASED_OUTPATIENT_CLINIC_OR_DEPARTMENT_OTHER): Payer: Medicare Other | Admitting: Anesthesiology

## 2022-10-24 ENCOUNTER — Ambulatory Visit (HOSPITAL_COMMUNITY): Payer: Medicare Other | Admitting: Anesthesiology

## 2022-10-24 ENCOUNTER — Encounter (HOSPITAL_COMMUNITY): Payer: Self-pay | Admitting: Cardiology

## 2022-10-24 ENCOUNTER — Encounter (HOSPITAL_COMMUNITY)
Admission: RE | Disposition: A | Discharge status: Home / Self Care | Payer: Self-pay | Source: Ambulatory Visit | Attending: Cardiology

## 2022-10-24 DIAGNOSIS — I483 Typical atrial flutter: Secondary | ICD-10-CM | POA: Diagnosis not present

## 2022-10-24 DIAGNOSIS — I4819 Other persistent atrial fibrillation: Secondary | ICD-10-CM | POA: Diagnosis not present

## 2022-10-24 DIAGNOSIS — I4891 Unspecified atrial fibrillation: Secondary | ICD-10-CM | POA: Diagnosis not present

## 2022-10-24 DIAGNOSIS — I4510 Unspecified right bundle-branch block: Secondary | ICD-10-CM | POA: Insufficient documentation

## 2022-10-24 DIAGNOSIS — I5042 Chronic combined systolic (congestive) and diastolic (congestive) heart failure: Secondary | ICD-10-CM | POA: Diagnosis not present

## 2022-10-24 DIAGNOSIS — N189 Chronic kidney disease, unspecified: Secondary | ICD-10-CM | POA: Diagnosis not present

## 2022-10-24 DIAGNOSIS — I110 Hypertensive heart disease with heart failure: Secondary | ICD-10-CM

## 2022-10-24 DIAGNOSIS — Z7901 Long term (current) use of anticoagulants: Secondary | ICD-10-CM | POA: Insufficient documentation

## 2022-10-24 DIAGNOSIS — I272 Pulmonary hypertension, unspecified: Secondary | ICD-10-CM | POA: Diagnosis not present

## 2022-10-24 DIAGNOSIS — I509 Heart failure, unspecified: Secondary | ICD-10-CM | POA: Diagnosis not present

## 2022-10-24 DIAGNOSIS — I252 Old myocardial infarction: Secondary | ICD-10-CM | POA: Diagnosis not present

## 2022-10-24 DIAGNOSIS — J449 Chronic obstructive pulmonary disease, unspecified: Secondary | ICD-10-CM | POA: Diagnosis not present

## 2022-10-24 DIAGNOSIS — I25119 Atherosclerotic heart disease of native coronary artery with unspecified angina pectoris: Secondary | ICD-10-CM | POA: Insufficient documentation

## 2022-10-24 DIAGNOSIS — I130 Hypertensive heart and chronic kidney disease with heart failure and stage 1 through stage 4 chronic kidney disease, or unspecified chronic kidney disease: Secondary | ICD-10-CM | POA: Insufficient documentation

## 2022-10-24 DIAGNOSIS — Z955 Presence of coronary angioplasty implant and graft: Secondary | ICD-10-CM | POA: Insufficient documentation

## 2022-10-24 DIAGNOSIS — Z87891 Personal history of nicotine dependence: Secondary | ICD-10-CM

## 2022-10-24 DIAGNOSIS — E039 Hypothyroidism, unspecified: Secondary | ICD-10-CM | POA: Insufficient documentation

## 2022-10-24 DIAGNOSIS — I4892 Unspecified atrial flutter: Secondary | ICD-10-CM | POA: Diagnosis not present

## 2022-10-24 DIAGNOSIS — G7000 Myasthenia gravis without (acute) exacerbation: Secondary | ICD-10-CM | POA: Diagnosis not present

## 2022-10-24 DIAGNOSIS — E785 Hyperlipidemia, unspecified: Secondary | ICD-10-CM | POA: Diagnosis not present

## 2022-10-24 HISTORY — PX: CARDIOVERSION: SHX1299

## 2022-10-24 LAB — POCT I-STAT, CHEM 8
BUN: 62 mg/dL — ABNORMAL HIGH (ref 8–23)
Calcium, Ion: 1.07 mmol/L — ABNORMAL LOW (ref 1.15–1.40)
Chloride: 100 mmol/L (ref 98–111)
Creatinine, Ser: 2.5 mg/dL — ABNORMAL HIGH (ref 0.61–1.24)
Glucose, Bld: 82 mg/dL (ref 70–99)
HCT: 34 % — ABNORMAL LOW (ref 39.0–52.0)
Hemoglobin: 11.6 g/dL — ABNORMAL LOW (ref 13.0–17.0)
Potassium: 3.4 mmol/L — ABNORMAL LOW (ref 3.5–5.1)
Sodium: 141 mmol/L (ref 135–145)
TCO2: 31 mmol/L (ref 22–32)

## 2022-10-24 LAB — TSH: TSH: 3.695 u[IU]/mL (ref 0.350–4.500)

## 2022-10-24 SURGERY — CARDIOVERSION
Anesthesia: General

## 2022-10-24 MED ORDER — LIDOCAINE 2% (20 MG/ML) 5 ML SYRINGE
INTRAMUSCULAR | Status: DC | PRN
  Administered 2022-10-24: 40 mg via INTRAVENOUS

## 2022-10-24 MED ORDER — SODIUM CHLORIDE 0.9 % IV SOLN: INTRAVENOUS | Status: DC

## 2022-10-24 MED ORDER — PROPOFOL 10 MG/ML IV BOLUS
INTRAVENOUS | Status: DC | PRN
  Administered 2022-10-24: 40 mg via INTRAVENOUS

## 2022-10-24 NOTE — Interval H&P Note (Signed)
History and Physical Interval Note:  10/24/2022 10:07 AM  Marcus A Dagostino Sr.  has presented today for surgery, with the diagnosis of AFIB/AFLUTTER.  The various methods of treatment have been discussed with the patient and family. After consideration of risks, benefits and other options for treatment, the patient has consented to  Procedure(s): CARDIOVERSION (N/A) as a surgical intervention.  The patient's history has been reviewed, patient examined, no change in status, stable for surgery.  I have reviewed the patient's chart and labs.  Questions were answered to the patient's satisfaction.     [name]

## 2022-10-24 NOTE — Discharge Instructions (Signed)
Electrical Cardioversion °Electrical cardioversion is the delivery of a jolt of electricity to restore a normal rhythm to the heart. A rhythm that is too fast or is not regular keeps the heart from pumping well. In this procedure, sticky patches or metal paddles are placed on the chest to deliver electricity to the heart from a device. °This procedure may be done in an emergency if: °There is low or no blood pressure as a result of the heart rhythm. °Normal rhythm must be restored as fast as possible to protect the brain and heart from further damage. °It may save a life. °This may also be a scheduled procedure for irregular or fast heart rhythms that are not immediately life-threatening. ° °What can I expect after the procedure? °Your blood pressure, heart rate, breathing rate, and blood oxygen level will be monitored until you leave the hospital or clinic. °Your heart rhythm will be watched to make sure it does not change. °You may have some redness on the skin where the shocks were given. Over the counter cortizone cream may be helpful.  °Follow these instructions at home: °Do not drive for 24 hours if you were given a sedative during your procedure. °Take over-the-counter and prescription medicines only as told by your health care provider. °Ask your health care provider how to check your pulse. Check it often. °Rest for 48 hours after the procedure or as told by your health care provider. °Avoid or limit your caffeine use as told by your health care provider. °Keep all follow-up visits as told by your health care provider. This is important. °Contact a health care provider if: °You feel like your heart is beating too quickly or your pulse is not regular. °You have a serious muscle cramp that does not go away. °Get help right away if: °You have discomfort in your chest. °You are dizzy or you feel faint. °You have trouble breathing or you are short of breath. °Your speech is slurred. °You have trouble moving an  arm or leg on one side of your body. °Your fingers or toes turn cold or blue. °Summary °Electrical cardioversion is the delivery of a jolt of electricity to restore a normal rhythm to the heart. °This procedure may be done right away in an emergency or may be a scheduled procedure if the condition is not an emergency. °Generally, this is a safe procedure. °After the procedure, check your pulse often as told by your health care provider. °This information is not intended to replace advice given to you by your health care provider. Make sure you discuss any questions you have with your health care provider. °Document Revised: 04/20/2019 Document Reviewed: 04/20/2019 °Elsevier Patient Education © 2020 [employer]  °

## 2022-10-24 NOTE — CV Procedure (Signed)
Procedure:   DCCV  Indication:  Symptomatic atrial flutter  Procedure Note:  The patient signed informed consent.  They have had had therapeutic anticoagulation with Xarelto greater than 3 weeks.  Anesthesia was administered by Dr. Lanetta Inch and Edmonia James, CRNA.  Adequate airway was maintained throughout and vital followed per protocol.  They were cardioverted x 1 with 100J of biphasic synchronized energy.  They converted to NSR with rate 60-70s.  There were no apparent complications.  The patient had normal neuro status and respiratory status post procedure with vitals stable as recorded elsewhere.    Follow up:  They will continue on current medical therapy and follow up with cardiology as scheduled.  Oswaldo Milian, MD 10/24/2022 10:22 AM

## 2022-10-24 NOTE — Anesthesia Postprocedure Evaluation (Signed)
Anesthesia Post Note  Patient: Marcus [name].  Procedure(s) Performed: CARDIOVERSION     Patient location during evaluation: Endoscopy Anesthesia Type: General Level of consciousness: awake and alert Pain management: pain level controlled Vital Signs Assessment: post-procedure vital signs reviewed and stable Respiratory status: spontaneous breathing, nonlabored ventilation, respiratory function stable and patient connected to nasal cannula oxygen Cardiovascular status: blood pressure returned to baseline and stable Postop Assessment: no apparent nausea or vomiting Anesthetic complications: no  No notable events documented.  Last Vitals:  Vitals:   10/24/22 1040 10/24/22 1051  BP: 135/69 (!) 148/74  Pulse: 72 74  Resp: (!) 22 (!) 27  Temp:    SpO2: 93% 94%    Last Pain:  Vitals:   10/24/22 1051  TempSrc:   PainSc: 0-No pain                 Almendra Loria L Callaway [place]

## 2022-10-24 NOTE — Transfer of Care (Signed)
Immediate Anesthesia Transfer of Care Note  Patient: Marcus A Tinkey Sr.  Procedure(s) Performed: CARDIOVERSION  Patient Location: Endoscopy Unit  Anesthesia Type:General  Level of Consciousness: awake, alert , and oriented  Airway & Oxygen Therapy: Patient Spontanous Breathing  Post-op Assessment: Report given to RN and Post -op Vital signs reviewed and stable  Post vital signs: Reviewed and stable  Last Vitals:  Vitals Value Taken Time  BP    Temp    Pulse 69 10/24/22 1023  Resp 17 10/24/22 1023  SpO2 98 % 10/24/22 1023  Vitals shown include unvalidated device data.  Last Pain:  Vitals:   10/24/22 0915  TempSrc: Temporal  PainSc: 0-No pain         Complications: No notable events documented.

## 2022-10-24 NOTE — Anesthesia Procedure Notes (Signed)
Procedure Name: General with mask airway Date/Time: 10/24/2022 10:11 AM  Performed by: Valda Favia, CRNAPre-anesthesia Checklist: Patient identified, Emergency Drugs available, Suction available, Patient being monitored and Timeout performed Patient Re-evaluated:Patient Re-evaluated prior to induction Oxygen Delivery Method: Ambu bag Preoxygenation: Pre-oxygenation with 100% oxygen Induction Type: IV induction Placement Confirmation: positive ETCO2 Dental Injury: Teeth and Oropharynx as per pre-operative assessment

## 2022-10-25 ENCOUNTER — Encounter (HOSPITAL_COMMUNITY): Payer: Self-pay | Admitting: Cardiology

## 2022-10-25 DIAGNOSIS — N1831 Chronic kidney disease, stage 3a: Secondary | ICD-10-CM | POA: Diagnosis not present

## 2022-10-28 ENCOUNTER — Other Ambulatory Visit: Payer: Self-pay

## 2022-10-28 ENCOUNTER — Emergency Department (HOSPITAL_COMMUNITY): Payer: Medicare Other

## 2022-10-28 ENCOUNTER — Inpatient Hospital Stay (HOSPITAL_COMMUNITY)
Admission: EM | Admit: 2022-10-28 | Discharge: 2022-11-01 | DRG: 193 | Disposition: E | Payer: Medicare Other | Attending: Family Medicine | Admitting: Family Medicine

## 2022-10-28 ENCOUNTER — Encounter (HOSPITAL_COMMUNITY): Payer: Self-pay

## 2022-10-28 DIAGNOSIS — I484 Atypical atrial flutter: Secondary | ICD-10-CM | POA: Diagnosis not present

## 2022-10-28 DIAGNOSIS — J9621 Acute and chronic respiratory failure with hypoxia: Secondary | ICD-10-CM | POA: Diagnosis not present

## 2022-10-28 DIAGNOSIS — J159 Unspecified bacterial pneumonia: Secondary | ICD-10-CM | POA: Clinically undetermined

## 2022-10-28 DIAGNOSIS — I480 Paroxysmal atrial fibrillation: Secondary | ICD-10-CM | POA: Diagnosis not present

## 2022-10-28 DIAGNOSIS — Z87891 Personal history of nicotine dependence: Secondary | ICD-10-CM

## 2022-10-28 DIAGNOSIS — N1831 Chronic kidney disease, stage 3a: Secondary | ICD-10-CM | POA: Diagnosis not present

## 2022-10-28 DIAGNOSIS — E039 Hypothyroidism, unspecified: Secondary | ICD-10-CM | POA: Diagnosis present

## 2022-10-28 DIAGNOSIS — J9811 Atelectasis: Secondary | ICD-10-CM | POA: Diagnosis present

## 2022-10-28 DIAGNOSIS — I252 Old myocardial infarction: Secondary | ICD-10-CM

## 2022-10-28 DIAGNOSIS — G9341 Metabolic encephalopathy: Secondary | ICD-10-CM | POA: Diagnosis not present

## 2022-10-28 DIAGNOSIS — Z66 Do not resuscitate: Secondary | ICD-10-CM | POA: Diagnosis present

## 2022-10-28 DIAGNOSIS — I21A1 Myocardial infarction type 2: Secondary | ICD-10-CM | POA: Diagnosis not present

## 2022-10-28 DIAGNOSIS — Z6822 Body mass index (BMI) 22.0-22.9, adult: Secondary | ICD-10-CM

## 2022-10-28 DIAGNOSIS — N390 Urinary tract infection, site not specified: Secondary | ICD-10-CM | POA: Diagnosis present

## 2022-10-28 DIAGNOSIS — F419 Anxiety disorder, unspecified: Secondary | ICD-10-CM | POA: Diagnosis present

## 2022-10-28 DIAGNOSIS — R0602 Shortness of breath: Secondary | ICD-10-CM | POA: Diagnosis not present

## 2022-10-28 DIAGNOSIS — J9601 Acute respiratory failure with hypoxia: Secondary | ICD-10-CM | POA: Diagnosis present

## 2022-10-28 DIAGNOSIS — I2510 Atherosclerotic heart disease of native coronary artery without angina pectoris: Secondary | ICD-10-CM | POA: Diagnosis present

## 2022-10-28 DIAGNOSIS — R7989 Other specified abnormal findings of blood chemistry: Secondary | ICD-10-CM

## 2022-10-28 DIAGNOSIS — E43 Unspecified severe protein-calorie malnutrition: Secondary | ICD-10-CM | POA: Diagnosis not present

## 2022-10-28 DIAGNOSIS — Z79899 Other long term (current) drug therapy: Secondary | ICD-10-CM

## 2022-10-28 DIAGNOSIS — E876 Hypokalemia: Secondary | ICD-10-CM

## 2022-10-28 DIAGNOSIS — J101 Influenza due to other identified influenza virus with other respiratory manifestations: Secondary | ICD-10-CM | POA: Diagnosis not present

## 2022-10-28 DIAGNOSIS — J1008 Influenza due to other identified influenza virus with other specified pneumonia: Secondary | ICD-10-CM | POA: Clinically undetermined

## 2022-10-28 DIAGNOSIS — Z79890 Hormone replacement therapy (postmenopausal): Secondary | ICD-10-CM

## 2022-10-28 DIAGNOSIS — I440 Atrioventricular block, first degree: Secondary | ICD-10-CM | POA: Diagnosis present

## 2022-10-28 DIAGNOSIS — Z8249 Family history of ischemic heart disease and other diseases of the circulatory system: Secondary | ICD-10-CM

## 2022-10-28 DIAGNOSIS — J439 Emphysema, unspecified: Secondary | ICD-10-CM | POA: Diagnosis not present

## 2022-10-28 DIAGNOSIS — I130 Hypertensive heart and chronic kidney disease with heart failure and stage 1 through stage 4 chronic kidney disease, or unspecified chronic kidney disease: Secondary | ICD-10-CM | POA: Diagnosis present

## 2022-10-28 DIAGNOSIS — Z1152 Encounter for screening for COVID-19: Secondary | ICD-10-CM | POA: Diagnosis not present

## 2022-10-28 DIAGNOSIS — I4892 Unspecified atrial flutter: Secondary | ICD-10-CM | POA: Diagnosis present

## 2022-10-28 DIAGNOSIS — I10 Essential (primary) hypertension: Secondary | ICD-10-CM | POA: Diagnosis present

## 2022-10-28 DIAGNOSIS — I4891 Unspecified atrial fibrillation: Secondary | ICD-10-CM | POA: Diagnosis not present

## 2022-10-28 DIAGNOSIS — E785 Hyperlipidemia, unspecified: Secondary | ICD-10-CM | POA: Diagnosis present

## 2022-10-28 DIAGNOSIS — I5030 Unspecified diastolic (congestive) heart failure: Secondary | ICD-10-CM | POA: Diagnosis not present

## 2022-10-28 DIAGNOSIS — J9622 Acute and chronic respiratory failure with hypercapnia: Secondary | ICD-10-CM | POA: Diagnosis not present

## 2022-10-28 DIAGNOSIS — Z515 Encounter for palliative care: Secondary | ICD-10-CM

## 2022-10-28 DIAGNOSIS — I5043 Acute on chronic combined systolic (congestive) and diastolic (congestive) heart failure: Secondary | ICD-10-CM | POA: Diagnosis present

## 2022-10-28 DIAGNOSIS — N179 Acute kidney failure, unspecified: Secondary | ICD-10-CM | POA: Diagnosis present

## 2022-10-28 DIAGNOSIS — I5033 Acute on chronic diastolic (congestive) heart failure: Secondary | ICD-10-CM | POA: Diagnosis present

## 2022-10-28 DIAGNOSIS — G7000 Myasthenia gravis without (acute) exacerbation: Secondary | ICD-10-CM | POA: Diagnosis not present

## 2022-10-28 DIAGNOSIS — R001 Bradycardia, unspecified: Secondary | ICD-10-CM | POA: Diagnosis present

## 2022-10-28 DIAGNOSIS — Z7901 Long term (current) use of anticoagulants: Secondary | ICD-10-CM

## 2022-10-28 DIAGNOSIS — R54 Age-related physical debility: Secondary | ICD-10-CM | POA: Diagnosis present

## 2022-10-28 DIAGNOSIS — I452 Bifascicular block: Secondary | ICD-10-CM | POA: Diagnosis present

## 2022-10-28 DIAGNOSIS — H9190 Unspecified hearing loss, unspecified ear: Secondary | ICD-10-CM | POA: Diagnosis present

## 2022-10-28 DIAGNOSIS — I272 Pulmonary hypertension, unspecified: Secondary | ICD-10-CM | POA: Diagnosis present

## 2022-10-28 DIAGNOSIS — R451 Restlessness and agitation: Secondary | ICD-10-CM | POA: Diagnosis not present

## 2022-10-28 DIAGNOSIS — Z888 Allergy status to other drugs, medicaments and biological substances status: Secondary | ICD-10-CM

## 2022-10-28 DIAGNOSIS — Z89422 Acquired absence of other left toe(s): Secondary | ICD-10-CM

## 2022-10-28 DIAGNOSIS — Z781 Physical restraint status: Secondary | ICD-10-CM

## 2022-10-28 DIAGNOSIS — Z955 Presence of coronary angioplasty implant and graft: Secondary | ICD-10-CM

## 2022-10-28 DIAGNOSIS — J111 Influenza due to unidentified influenza virus with other respiratory manifestations: Secondary | ICD-10-CM | POA: Diagnosis not present

## 2022-10-28 DIAGNOSIS — R531 Weakness: Secondary | ICD-10-CM | POA: Diagnosis not present

## 2022-10-28 DIAGNOSIS — E871 Hypo-osmolality and hyponatremia: Secondary | ICD-10-CM | POA: Diagnosis present

## 2022-10-28 LAB — RESP PANEL BY RT-PCR (RSV, FLU A&B, COVID)  RVPGX2
Influenza A by PCR: NEGATIVE
Influenza B by PCR: POSITIVE — AB
Resp Syncytial Virus by PCR: NEGATIVE
SARS Coronavirus 2 by RT PCR: NEGATIVE

## 2022-10-28 LAB — URINALYSIS, ROUTINE W REFLEX MICROSCOPIC
Bilirubin Urine: NEGATIVE
Glucose, UA: NEGATIVE mg/dL
Ketones, ur: NEGATIVE mg/dL
Leukocytes,Ua: NEGATIVE
Nitrite: NEGATIVE
Protein, ur: 100 mg/dL — AB
RBC / HPF: >50 RBC/hpf (ref 0–5)
Specific Gravity, Urine: 1.009 (ref 1.005–1.030)
pH: 5 (ref 5.0–8.0)

## 2022-10-28 LAB — CBC WITH DIFFERENTIAL/PLATELET
Abs Immature Granulocytes: 0.15 10*3/uL — ABNORMAL HIGH (ref 0.00–0.07)
Basophils Absolute: 0 10*3/uL (ref 0.0–0.1)
Basophils Relative: 0 %
Eosinophils Absolute: 0 10*3/uL (ref 0.0–0.5)
Eosinophils Relative: 0 %
HCT: 37.1 % — ABNORMAL LOW (ref 39.0–52.0)
Hemoglobin: 12.1 g/dL — ABNORMAL LOW (ref 13.0–17.0)
Immature Granulocytes: 1 %
Lymphocytes Relative: 3 %
Lymphs Abs: 0.4 10*3/uL — ABNORMAL LOW (ref 0.7–4.0)
MCH: 29.7 pg (ref 26.0–34.0)
MCHC: 32.6 g/dL (ref 30.0–36.0)
MCV: 91.2 fL (ref 80.0–100.0)
Monocytes Absolute: 0.7 10*3/uL (ref 0.1–1.0)
Monocytes Relative: 5 %
Neutro Abs: 12.5 10*3/uL — ABNORMAL HIGH (ref 1.7–7.7)
Neutrophils Relative %: 91 %
Platelets: 240 10*3/uL (ref 150–400)
RBC: 4.07 MIL/uL — ABNORMAL LOW (ref 4.22–5.81)
RDW: 15.8 % — ABNORMAL HIGH (ref 11.5–15.5)
WBC: 13.7 10*3/uL — ABNORMAL HIGH (ref 4.0–10.5)
nRBC: 0 % (ref 0.0–0.2)

## 2022-10-28 LAB — COMPREHENSIVE METABOLIC PANEL
ALT: 21 U/L (ref 0–44)
AST: 27 U/L (ref 15–41)
Albumin: 2.8 g/dL — ABNORMAL LOW (ref 3.5–5.0)
Alkaline Phosphatase: 78 U/L (ref 38–126)
Anion gap: 14 (ref 5–15)
BUN: 44 mg/dL — ABNORMAL HIGH (ref 8–23)
CO2: 26 mmol/L (ref 22–32)
Calcium: 8.5 mg/dL — ABNORMAL LOW (ref 8.9–10.3)
Chloride: 91 mmol/L — ABNORMAL LOW (ref 98–111)
Creatinine, Ser: 2.4 mg/dL — ABNORMAL HIGH (ref 0.61–1.24)
GFR, Estimated: 25 mL/min — ABNORMAL LOW (ref >60)
Glucose, Bld: 110 mg/dL — ABNORMAL HIGH (ref 70–99)
Potassium: 2.8 mmol/L — ABNORMAL LOW (ref 3.5–5.1)
Sodium: 131 mmol/L — ABNORMAL LOW (ref 135–145)
Total Bilirubin: 1 mg/dL (ref 0.3–1.2)
Total Protein: 6.3 g/dL — ABNORMAL LOW (ref 6.5–8.1)

## 2022-10-28 LAB — TROPONIN I (HIGH SENSITIVITY)
Troponin I (High Sensitivity): 105 ng/L (ref <18)
Troponin I (High Sensitivity): 107 ng/L (ref <18)

## 2022-10-28 LAB — BRAIN NATRIURETIC PEPTIDE: B Natriuretic Peptide: 468 pg/mL — ABNORMAL HIGH (ref 0.0–100.0)

## 2022-10-28 LAB — MAGNESIUM: Magnesium: 1.7 mg/dL (ref 1.7–2.4)

## 2022-10-28 MED ORDER — BENZONATATE 100 MG PO CAPS
100.0000 mg | ORAL_CAPSULE | Freq: Once | ORAL | Status: AC
  Administered 2022-10-28: 100 mg via ORAL
  Filled 2022-10-28: qty 1

## 2022-10-28 MED ORDER — OSELTAMIVIR PHOSPHATE 30 MG PO CAPS
30.0000 mg | ORAL_CAPSULE | Freq: Once | ORAL | Status: AC
  Administered 2022-10-28: 30 mg via ORAL
  Filled 2022-10-28: qty 1

## 2022-10-28 MED ORDER — POTASSIUM CHLORIDE 10 MEQ/100ML IV SOLN
10.0000 meq | INTRAVENOUS | Status: AC
  Administered 2022-10-28 (×4): 10 meq via INTRAVENOUS
  Filled 2022-10-28 (×3): qty 100

## 2022-10-28 MED ORDER — POTASSIUM CHLORIDE CRYS ER 20 MEQ PO TBCR
40.0000 meq | EXTENDED_RELEASE_TABLET | Freq: Once | ORAL | Status: AC
  Administered 2022-10-28: 40 meq via ORAL
  Filled 2022-10-28: qty 2

## 2022-10-28 NOTE — ED Provider Triage Note (Signed)
Emergency Medicine Provider Triage Evaluation Note  Marcus A Dayley Sr. , a 89 y.o. male  was evaluated in triage.  Pt complains of shortness of breath , weakness and cough   Review of Systems  Positive: Cough leg swelling Negative: Fever   Physical Exam  BP 117/69 (BP Location: Left Arm)   Pulse 69   Temp 97.7 F (36.5 C)   Resp 16   Ht '5\' 8"'$  (1.727 m)   Wt 73.5 kg   SpO2 96%   BMI 24.63 kg/m  Gen:   Awake, no distress   Resp:  Normal effort  MSK:   Swollen lower extremities  Other:    Medical Decision Making  Medically screening exam initiated at 3:34 PM.  Appropriate orders placed.  Marcus A Gregson Sr. was informed that the remainder of the evaluation will be completed by another provider, this initial triage assessment does not replace that evaluation, and the importance of remaining in the ED until their evaluation is complete.     Fransico Meadow, [place] 10/27/2022 1535

## 2022-10-28 NOTE — H&P (Signed)
History and Physical    Patient: Marcus GETER Sr. NLG:[id number] DOB: 1932-07-30 DOA: 10/27/2022 DOS: the patient was seen and examined on 10/05/2022 PCP: [name], MD  Patient coming from: {Point_of_Origin:[address]}  Chief Complaint:  Chief Complaint  Patient presents with   Shortness of Breath   HPI: Marcus MCCANNON Sr. is a 89 y.o. male with medical history significant of ***  Review of Systems: {ROS_Text:26778} Past Medical History:  Diagnosis Date   Acute on chronic combined systolic and diastolic CHF (congestive heart failure) ([place]) 08/22/2018   Acute on chronic respiratory failure ([place]) 04/15/2015   Acute renal failure ([place]) 04/15/2015   Anemia    Atrial flutter ([place])    Bradycardia    CAD in native artery 08/22/2018   CAP (community acquired pneumonia) 04/13/2015   See cxr 04/13/2015 > admit [facility]     Chest pain 08/20/2018   H/O hiatal hernia    Hyperlipidemia LDL goal <70 08/22/2018   Hypertension 08/22/2018   Hypothyroidism 04/13/2015   Myasthenia gravis ([place]) 04/13/2015   Non-ST elevation (NSTEMI) myocardial infarction ([place])    Obesity 04/13/2015   PAF (paroxysmal atrial fibrillation) ([place]) 04/16/2015   RBBB    S/P angioplasty with stent 08/21/18 DES to RCA 08/22/2018   Sepsis ([place]) 04/15/2015   Thrombocytopenia ([place])    Past Surgical History:  Procedure Laterality Date   AMPUTATION  06/11/2012   Procedure: AMPUTATION DIGIT;  Surgeon: [name], MD;  Location: [facility];  Service: Orthopedics;  Laterality: Left;  revision of amputation   CARDIOVERSION N/A 03/24/2020   Procedure: CARDIOVERSION;  Surgeon: [name], [name], MD;  Location: [facility];  Service: Cardiovascular;  Laterality: N/A;   CARDIOVERSION N/A 02/14/2021   Procedure: CARDIOVERSION;  Surgeon: [name], MD;  Location: [facility];  Service: Cardiovascular;  Laterality: N/A;   CARDIOVERSION N/A 11/03/2021   Procedure: CARDIOVERSION;  Surgeon: Buford Dresser, MD;   Location: [facility] ENDOSCOPY;  Service: Cardiovascular;  Laterality: N/A;   CARDIOVERSION N/A 08/09/2022   Procedure: CARDIOVERSION;  Surgeon: Werner Lean, MD;  Location: [facility] ENDOSCOPY;  Service: Cardiovascular;  Laterality: N/A;   CARDIOVERSION N/A 10/24/2022   Procedure: CARDIOVERSION;  Surgeon: [name], MD;  Location: [facility] ENDOSCOPY;  Service: Cardiovascular;  Laterality: N/A;   CORONARY STENT INTERVENTION N/A 08/21/2018   Procedure: CORONARY STENT INTERVENTION;  Surgeon: [name], MD;  Location: [facility] CV [facility];  Service: Cardiovascular;  Laterality: N/A;   CORONARY STENT INTERVENTION N/A 10/13/2021   Procedure: CORONARY STENT INTERVENTION;  Surgeon: Burnell Blanks, MD;  Location: [facility];  Service: Cardiovascular;  Laterality: N/A;   HERNIA REPAIR  1994   IR ANGIO INTRA EXTRACRAN SEL COM CAROTID INNOMINATE BILAT MOD SED  06/14/2022   LEFT HEART CATH AND CORONARY ANGIOGRAPHY N/A 08/21/2018   Procedure: LEFT HEART CATH AND CORONARY ANGIOGRAPHY;  Surgeon: [name], MD;  Location: [facility];  Service: Cardiovascular;  Laterality: N/A;   LEFT HEART CATH AND CORONARY ANGIOGRAPHY N/A 10/13/2021   Procedure: LEFT HEART CATH AND CORONARY ANGIOGRAPHY;  Surgeon: Burnell Blanks, MD;  Location: [facility];  Service: Cardiovascular;  Laterality: N/A;   ORIF SHOULDER FRACTURE  06/13/2012   Procedure: OPEN REDUCTION INTERNAL FIXATION (ORIF) SHOULDER FRACTURE;  Surgeon: [name], MD;  Location: [facility];  Service: Orthopedics;  Laterality: Left;  LEFT SHOULDER OPEN GREATER TUBEROSITY ORIF   RIGHT/LEFT HEART CATH AND CORONARY ANGIOGRAPHY N/A 05/11/2022   Procedure: RIGHT/LEFT  HEART CATH AND CORONARY ANGIOGRAPHY;  Surgeon: Martinique, [name], MD;  Location: [facility];  Service: Cardiovascular;  Laterality: N/A;   SHOULDER CLOSED REDUCTION  06/11/2012   Procedure: CLOSED MANIPULATION SHOULDER;  Surgeon: [name], MD;   Location: [facility];  Service: Orthopedics;  Laterality: Left;   Social History:  reports that he quit smoking about 40 years ago. His smoking use included cigarettes. He has a 30.00 pack-year smoking history. He has never used smokeless tobacco. He reports that he does not currently use alcohol after a past usage of about 2.0 standard drinks of alcohol per week. He reports that he does not use drugs.  Allergies  Allergen Reactions   Ace Inhibitors Hives   Beta Adrenergic Blockers Hives   Statins Hives    Family History  Problem Relation Age of Onset   Other Mother        Deceased, 36   Heart attack Father        Deceased, 26   Healthy Sister    Healthy Daughter        x 4   Healthy Son        x 3    Prior to Admission medications   Medication Sig Start Date End Date Taking? Authorizing Provider  amiodarone (PACERONE) 200 MG tablet Take 1 tablet (200 mg total) TWICE daily for one month, then take 1 tablet ONCE daily 10/18/22   Camnitz, Ocie Doyne, MD  amLODipine (NORVASC) 10 MG tablet Take 1 tablet (10 mg total) by mouth daily. 05/08/22   Martinique, [name], MD  busPIRone (BUSPAR) 7.5 MG tablet Take 7.5 mg by mouth 2 (two) times daily. 07/16/22   [provider]  Carboxymethylcellulose Sodium (THERATEARS) 0.25 % SOLN Apply 2 drops to eye in the morning, at noon, in the evening, and at bedtime.    [provider]  Cholecalciferol (VITAMIN D-3) 125 MCG (5000 UT) TABS Take 5,000 Units by mouth daily.    [provider]  docusate sodium (COLACE) 100 MG capsule Take 100 mg by mouth daily as needed for mild constipation.    [provider]  levothyroxine (SYNTHROID) 137 MCG tablet Take 137 mcg by mouth daily before breakfast.    [provider]  Magnesium 400 MG TABS Take 400 mg by mouth daily.    [provider]  nitroGLYCERIN (NITROSTAT) 0.4 MG SL tablet Place 0.4 mg under the tongue every 5 (five) hours as needed for chest pain. 06/23/22    [provider]  polyethylene glycol (MIRALAX / GLYCOLAX) 17 g packet Take 17 g by mouth daily as needed for mild constipation.    [provider]  torsemide (DEMADEX) 20 MG tablet Take 40 mg by mouth 2 (two) times daily.    [provider]  XARELTO 15 MG TABS tablet Take 1 tablet (15 mg total) by mouth daily. 10/05/22   Martinique, [name], MD    Physical Exam: Vitals:   10/07/2022 1517 10/30/2022 1710 10/03/2022 1745 10/12/2022 1800  BP:  (!) 142/79 135/68 134/84  Pulse:  76 76 74  Resp:  (!) 24 17 (!) 22  Temp:      SpO2:  95% 94% 95%  Weight: 73.5 kg     Height: '5\' 8"'$  (1.727 m)      *** Data Reviewed: {Tip this will not be part of the note when signed- Document your independent interpretation of telemetry tracing, EKG, lab, Radiology test or any other diagnostic  tests. Add any new diagnostic test ordered today. (Optional):26781} {Results:26384}  Assessment and Plan: No notes have been filed under this hospital service. Service: Hospitalist     Advance Care Planning:   Code Status: DNR ***  Consults: ***  Family Communication: ***  Severity of Illness: {Observation/Inpatient:21159}  AuthorBarbette Merino, MD 10/20/2022 7:02 PM  For on call review [email or web address].

## 2022-10-28 NOTE — ED Notes (Signed)
RN responded to patient call light. Pt using urinal and RN will return.

## 2022-10-28 NOTE — ED Triage Notes (Signed)
Pt arrives with c/o COB and coughing about 4-5 ago. Pt was recently cardioverted for a.fib. Pt also endorses fatigue and weakness. Pt is taking cipro for UTI. Pt denies n/v.

## 2022-10-28 NOTE — ED Notes (Signed)
Patient transported to X-ray 

## 2022-10-28 NOTE — ED Notes (Signed)
ED TO INPATIENT HANDOFF REPORT  ED Nurse Name and Phone #:  Lorenda Ishihara [phone]     S Name/Age/Gender Marcus [name] Sr. 89 y.o. male Room/Bed: 008C/008C  Code Status   Code Status: DNR  Home/SNF/Other Home Patient oriented to: self, place, time, and situation Is this baseline? Yes   Triage Complete: Triage complete  Chief Complaint Hypokalemia [E87.6]  Triage Note Pt arrives with c/o COB and coughing about 4-5 ago. Pt was recently cardioverted for a.fib. Pt also endorses fatigue and weakness. Pt is taking cipro for UTI. Pt denies n/v.    Allergies Allergies  Allergen Reactions   Ace Inhibitors Hives   Beta Adrenergic Blockers Hives   Statins Hives    Level of Care/Admitting Diagnosis ED Disposition     ED Disposition  Admit   Condition  --   Comment  Hospital Area: [place] [[id number]]  Level of Care: Telemetry Cardiac [[id number]]  May admit patient to [facility] or [facility] if equivalent level of care is available:: Yes  Covid Evaluation: Confirmed COVID Negative  Diagnosis: Hypokalemia [361443]  Admitting Physician: Elwyn Reach [[id number]]  Attending Physician: Elwyn Reach [[id number]]  Certification:: I certify this patient will need inpatient services for at least 2 midnights  Estimated Length of Stay: 3          B Medical/Surgery History Past Medical History:  Diagnosis Date   Acute on chronic combined systolic and diastolic CHF (congestive heart failure) ([place]) 08/22/2018   Acute on chronic respiratory failure ([place]) 04/15/2015   Acute renal failure ([place]) 04/15/2015   Anemia    Atrial flutter ([facility])    Bradycardia    CAD in native artery 08/22/2018   CAP (community acquired pneumonia) 04/13/2015   See cxr 04/13/2015 > admit [facility]     Chest pain 08/20/2018   H/O hiatal hernia    Hyperlipidemia LDL goal <70 08/22/2018   Hypertension 08/22/2018   Hypothyroidism 04/13/2015   Myasthenia gravis ([place]) 04/13/2015   Non-ST elevation  (NSTEMI) myocardial infarction ([place])    Obesity 04/13/2015   PAF (paroxysmal atrial fibrillation) ([place]) 04/16/2015   RBBB    S/P angioplasty with stent 08/21/18 DES to RCA 08/22/2018   Sepsis ([place]) 04/15/2015   Thrombocytopenia ([place])    Past Surgical History:  Procedure Laterality Date   AMPUTATION  06/11/2012   Procedure: AMPUTATION DIGIT;  Surgeon: [name], MD;  Location: [facility];  Service: Orthopedics;  Laterality: Left;  revision of amputation   CARDIOVERSION N/A 03/24/2020   Procedure: CARDIOVERSION;  Surgeon: [name], [name], MD;  Location: [facility];  Service: Cardiovascular;  Laterality: N/A;   CARDIOVERSION N/A 02/14/2021   Procedure: CARDIOVERSION;  Surgeon: [name], MD;  Location: [facility];  Service: Cardiovascular;  Laterality: N/A;   CARDIOVERSION N/A 11/03/2021   Procedure: CARDIOVERSION;  Surgeon: Buford Dresser, MD;  Location: [facility] END[facility]COPY;  Service: Cardiovascular;  Laterality: N/A;   CARDIOVERSION N/A 08/09/2022   Procedure: CARDIOVERSION;  Surgeon: Werner Lean, MD;  Location: [facility] ENDOSCOPY;  Service: Cardiovascular;  Laterality: N/A;   CARDIOVERSION N/A 10/24/2022   Procedure: CARDIOVERSION;  Surgeon: [name], MD;  Location: [facility];  Service: Cardiovascular;  Laterality: N/A;   CORONARY STENT INTERVENTION N/A 08/21/2018   Procedure: CORONARY STENT INTERVENTION;  Surgeon: [name], MD;  Location: [place] CV LAB;  Service: Cardiovascular;  Laterality: N/A;   CORONARY STENT INTERVENTION N/A 10/13/2021   Procedure: CORONARY STENT INTERVENTION;  Surgeon: Angelena Form,  Annita Brod, MD;  Location: [place] CV LAB;  Service: Cardiovascular;  Laterality: N/A;   HERNIA REPAIR  1994   IR ANGIO INTRA EXTRACRAN SEL COM CAROTID INNOMINATE BILAT MOD SED  06/14/2022   LEFT HEART CATH AND CORONARY ANGIOGRAPHY N/A 08/21/2018   Procedure: LEFT HEART CATH AND CORONARY ANGIOGRAPHY;  Surgeon: [name], MD;   Location: [place] [facility];  Service: Cardiovascular;  Laterality: N/A;   LEFT HEART CATH AND CORONARY ANGIOGRAPHY N/A 10/13/2021   Procedure: LEFT HEART CATH AND CORONARY ANGIOGRAPHY;  Surgeon: Burnell Blanks, MD;  Location: [place] [facility] LAB;  Service: Cardiovascular;  Laterality: N/A;   ORIF SHOULDER FRACTURE  06/13/2012   Procedure: OPEN REDUCTION INTERNAL FIXATION (ORIF) SHOULDER FRACTURE;  Surgeon: [name], MD;  Location: [facility];  Service: Orthopedics;  Laterality: Left;  LEFT SHOULDER OPEN GREATER TUBEROSITY ORIF   RIGHT/LEFT HEART CATH AND CORONARY ANGIOGRAPHY N/A 05/11/2022   Procedure: RIGHT/LEFT HEART CATH AND CORONARY ANGIOGRAPHY;  Surgeon: Martinique, [name], MD;  Location: [place] CV LAB;  Service: Cardiovascular;  Laterality: N/A;   SHOULDER CLOSED REDUCTION  06/11/2012   Procedure: CLOSED MANIPULATION SHOULDER;  Surgeon: [name], MD;  Location: [place];  Service: Orthopedics;  Laterality: Left;     A IV Location/Drains/Wounds Patient Lines/Drains/Airways Status     Active Line/Drains/Airways     Name Placement date Placement time Site Days   Peripheral IV 10/23/2022 20 G Anterior;Right Forearm 10/21/2022  1738  Forearm  less than 1   Wound / Incision (Open or Dehisced) 07/07/22 Skin tear Buttocks Left;Right bilaterial skin tear on buttocks 07/07/22  1831  Buttocks  113            Intake/Output Last 24 hours  Intake/Output Summary (Last 24 hours) at 10/09/2022 1953 Last data filed at 10/01/2022 1945 Gross per 24 hour  Intake --  Output 300 ml  Net -300 ml    Labs/Imaging Results for orders placed or performed during the hospital encounter of 10/14/2022 (from the past 48 hour(s))  CBC with Differential     Status: Abnormal   Collection Time: 10/27/2022  3:37 PM  Result Value Ref Range   WBC 13.7 (H) 4.0 - 10.5 K/uL   RBC 4.07 (L) 4.22 - 5.81 MIL/uL   Hemoglobin 12.1 (L) 13.0 - 17.0 g/dL   HCT 37.1 (L) 39.0 - 52.0 %   MCV 91.2 80.0 - 100.0 fL    MCH 29.7 26.0 - 34.0 pg   MCHC 32.6 30.0 - 36.0 g/dL   RDW 15.8 (H) 11.5 - 15.5 %   Platelets 240 150 - 400 K/uL   nRBC 0.0 0.0 - 0.2 %   Neutrophils Relative % 91 %   Neutro Abs 12.5 (H) 1.7 - 7.7 K/uL   Lymphocytes Relative 3 %   Lymphs Abs 0.4 (L) 0.7 - 4.0 K/uL   Monocytes Relative 5 %   Monocytes Absolute 0.7 0.1 - 1.0 K/uL   Eosinophils Relative 0 %   Eosinophils Absolute 0.0 0.0 - 0.5 K/uL   Basophils Relative 0 %   Basophils Absolute 0.0 0.0 - 0.1 K/uL   Immature Granulocytes 1 %   Abs Immature Granulocytes 0.15 (H) 0.00 - 0.07 K/uL    Comment: Performed at [facility], [address] [place]  [address]  Comprehensive metabolic panel     Status: Abnormal   Collection Time: 10/27/2022  3:37 PM  Result Value Ref Range   Sodium  131 (L) 135 - 145 mmol/L   Potassium 2.8 (L) 3.5 - 5.1 mmol/L   Chloride 91 (L) 98 - 111 mmol/L   CO2 26 22 - 32 mmol/L   Glucose, Bld 110 (H) 70 - 99 mg/dL    Comment: Glucose reference range applies only to samples taken after fasting for at least 8 hours.   BUN 44 (H) 8 - 23 mg/dL   Creatinine, Ser 2.40 (H) 0.61 - 1.24 mg/dL   Calcium 8.5 (L) 8.9 - 10.3 mg/dL   Total Protein 6.3 (L) 6.5 - 8.1 g/dL   Albumin 2.8 (L) 3.5 - 5.0 g/dL   AST 27 15 - 41 U/L   ALT 21 0 - 44 U/L   Alkaline Phosphatase 78 38 - 126 U/L   Total Bilirubin 1.0 0.3 - 1.2 mg/dL   GFR, Estimated 25 (L) >60 mL/min    Comment: (NOTE) Calculated using the CKD-EPI Creatinine Equation (2021)    Anion gap 14 5 - 15    Comment: Performed at [facility], [place] [address], [place], [place] [address]  Troponin I (High Sensitivity)     Status: Abnormal   Collection Time: 10/10/2022  3:37 PM  Result Value Ref Range   Troponin I (High Sensitivity) 105 (HH) <18 ng/L    Comment: CRITICAL RESULT CALLED TO, READ BACK BY AND VERIFIED WITH K. NOVIELLO RN 10/11/2022 '@1647'$  BY J. WHITE (NOTE) Elevated high sensitivity troponin I (hsTnI) values and significant  changes across  serial measurements may suggest ACS but many other  chronic and acute conditions are known to elevate hsTnI results.  Refer to the "Links" section for chest pain algorithms and additional  guidance. Performed at [facility], [place] [address] [place], [place] [address]   Resp panel by RT-PCR (RSV, Flu A&B, Covid) Anterior Nasal Swab     Status: Abnormal   Collection Time: 10/30/2022  3:37 PM   Specimen: Anterior Nasal Swab  Result Value Ref Range   SARS Coronavirus 2 by RT PCR NEGATIVE NEGATIVE    Comment: (NOTE) SARS-CoV-2 target nucleic acids are NOT DETECTED.  The SARS-CoV-2 RNA is generally detectable in upper respiratory specimens during the acute phase of infection. The lowest concentration of SARS-CoV-2 viral copies this assay can detect is 138 copies/mL. A negative result does not preclude SARS-Cov-2 infection and should not be used as the sole basis for treatment or other patient management decisions. A negative result may occur with  improper specimen collection/handling, submission of specimen other than nasopharyngeal swab, presence of viral mutation(s) within the areas targeted by this assay, and inadequate number of viral copies(<138 copies/mL). A negative result must be combined with clinical observations, patient history, and epidemiological information. The expected result is Negative.  Fact Sheet for Patients:  EntrepreneurPulse.com.au  Fact Sheet for Healthcare Providers:  IncredibleEmployment.be  This test is no t yet approved or cleared by the [place] FDA and  has been authorized for detection and/or diagnosis of SARS-CoV-2 by FDA under an Emergency Use Authorization (EUA). This EUA will remain  in effect (meaning this test can be used) for the duration of the COVID-19 declaration under Section 564(b)(1) of the Act, 21 U.S.C.section 360bbb-3(b)(1), unless the authorization is terminated  or revoked sooner.        Influenza A by PCR NEGATIVE NEGATIVE   Influenza B by PCR POSITIVE (A) NEGATIVE    Comment: (NOTE) The Xpert Xpress SARS-CoV-2/FLU/RSV plus assay is intended as an aid in the  diagnosis of influenza from Nasopharyngeal swab specimens and should not be used as a sole basis for treatment. Nasal washings and aspirates are unacceptable for Xpert Xpress SARS-CoV-2/FLU/RSV testing.  Fact Sheet for Patients: EntrepreneurPulse.com.au  Fact Sheet for Healthcare Providers: IncredibleEmployment.be  This test is not yet approved or cleared by the [place] FDA and has been authorized for detection and/or diagnosis of SARS-CoV-2 by FDA under an Emergency Use Authorization (EUA). This EUA will remain in effect (meaning this test can be used) for the duration of the COVID-19 declaration under Section 564(b)(1) of the Act, 21 U.S.C. section 360bbb-3(b)(1), unless the authorization is terminated or revoked.     Resp Syncytial Virus by PCR NEGATIVE NEGATIVE    Comment: (NOTE) Fact Sheet for Patients: EntrepreneurPulse.com.au  Fact Sheet for Healthcare Providers: IncredibleEmployment.be  This test is not yet approved or cleared by the [place] FDA and has been authorized for detection and/or diagnosis of SARS-CoV-2 by FDA under an Emergency Use Authorization (EUA). This EUA will remain in effect (meaning this test can be used) for the duration of the COVID-19 declaration under Section 564(b)(1) of the Act, 21 U.S.C. section 360bbb-3(b)(1), unless the authorization is terminated or revoked.  Performed at [facility], [place] [address] [place], [place] [address]   Magnesium     Status: None   Collection Time: 10/06/2022  5:20 PM  Result Value Ref Range   Magnesium 1.7 1.7 - 2.4 mg/dL    Comment: Performed at [facility] [address], [place], [place] [address]  Troponin I (High Sensitivity)      Status: Abnormal   Collection Time: 10/12/2022  5:37 PM  Result Value Ref Range   Troponin I (High Sensitivity) 107 (HH) <18 ng/L    Comment: CRITICAL VALUE NOTED. VALUE IS CONSISTENT WITH PREVIOUSLY REPORTED/CALLED VALUE (NOTE) Elevated high sensitivity troponin I (hsTnI) values and significant  changes across serial measurements may suggest ACS but many other  chronic and acute conditions are known to elevate hsTnI results.  Refer to the "Links" section for chest pain algorithms and additional  guidance. Performed at [facility], [place] [address] [place], [place] [address]   Urinalysis, Routine w reflex microscopic -Urine, Clean Catch     Status: Abnormal   Collection Time: 10/13/2022  5:42 PM  Result Value Ref Range   Color, Urine YELLOW YELLOW   APPearance HAZY (A) CLEAR   Specific Gravity, Urine 1.009 1.005 - 1.030   pH 5.0 5.0 - 8.0   Glucose, UA NEGATIVE NEGATIVE mg/dL   Hgb urine dipstick LARGE (A) NEGATIVE   Bilirubin Urine NEGATIVE NEGATIVE   Ketones, ur NEGATIVE NEGATIVE mg/dL   Protein, ur 100 (A) NEGATIVE mg/dL   Nitrite NEGATIVE NEGATIVE   Leukocytes,Ua NEGATIVE NEGATIVE   RBC / HPF >50 0 - 5 RBC/hpf   WBC, UA 6-10 0 - 5 WBC/hpf   Bacteria, UA RARE (A) NONE SEEN   Squamous Epithelial / HPF 0-5 0 - 5 /HPF   Mucus PRESENT    Hyaline Casts, UA PRESENT     Comment: Performed at [facility], [address], [place], [place] [address]   DG Chest 2 View  Result Date: 10/01/2022 CLINICAL DATA:  Cough. EXAM: CHEST - 2 VIEW COMPARISON:  July 07, 2022. FINDINGS: Stable cardiomegaly. Multiple left rib fractures are again noted. Small bilateral pleural effusions are noted with associated subsegmental atelectasis, left greater than right. IMPRESSION: Small bilateral pleural effusions are noted  with associated subsegmental atelectasis, left greater than right. Multiple left rib fractures are again noted in unchanged. Electronically Signed   By: Marijo Conception M.D.    On: 10/23/2022 17:03    Pending Labs Unresulted Labs (From admission, onward)     Start     Ordered   10/10/2022 1719  Brain natriuretic peptide  Once,   URGENT        10/27/2022 1718   Signed and Held  CBC  (heparin)  Once,   R       Comments: Baseline for heparin therapy IF NOT ALREADY DRAWN.  Notify MD if PLT < 100 K.    Signed and Held   Signed and Held  Creatinine, serum  (heparin)  Once,   R       Comments: Baseline for heparin therapy IF NOT ALREADY DRAWN.    Signed and Held   Signed and Held  CBC  Tomorrow morning,   R        Signed and Held   Signed and Held  Comprehensive metabolic panel  Tomorrow morning,   R        Signed and Held            Vitals/Pain Today's Vitals   10/11/2022 1710 10/19/2022 1745 10/06/2022 1800 10/01/2022 1900  BP: (!) 142/79 135/68 134/84 132/70  Pulse: 76 76 74 76  Resp: (!) 24 17 (!) 22 (!) 22  Temp:    98.1 F (36.7 C)  TempSrc:    Oral  SpO2: 95% 94% 95% 94%  Weight:      Height:      PainSc:        Isolation Precautions Airborne and Contact precautions  Medications Medications  potassium chloride 10 mEq in 100 mL IVPB (10 mEq Intravenous New Bag/Given 10/12/2022 1943)  oseltamivir (TAMIFLU) capsule 30 mg (30 mg Oral Given 10/22/2022 1756)  potassium chloride SA (KLOR-CON M) CR tablet 40 mEq (40 mEq Oral Given 10/19/2022 1738)  benzonatate (TESSALON) capsule 100 mg (100 mg Oral Given 10/18/2022 1841)    Mobility walks with person assist     Focused Assessments Pulmonary Assessment Handoff:  Lung sounds: Bilateral Breath Sounds: Rales O2 Device: Room Air      R Recommendations: See Admitting Provider Note  Report given to:   Additional Notes:   Call RN for any additional questions.

## 2022-10-28 NOTE — Consult Note (Signed)
Cardiology Consultation:   Patient ID: Marcus Kaufmann Sr. MRN: [id number]; DOB: [birth year]  Admit date: 10/18/2022 Date of Consult: 10/23/2022  Primary Care Provider: [name], MD [facility] Cardiologist: Peter Martinique, MD  [facility] Electrophysiologist:  Will Meredith Leeds, MD    Patient Profile:   Marcus Kaufmann Sr. is a 89 y.o. male with a hx of HTN, HLD, CKD3, myasthenia gravis, CAD, chronic pAF/AFL who is being seen today for the evaluation of elevated troponin at the request of Emergency Department.  History of Present Illness:   Mr. [name] is an 28M PMHx HTN, HLD, CKD3, myasthenia gravis, CAD (s/p DES-pRCA, CTO LAD), HFpEF, chronic atrial fibrillation/flutter (multiple prior DCCV, most recently 10/24/22, currently on amiodarone '200mg'$  BID and Xarelto) who presents to ED for further evaluation of cough, shortness of breath, generalized fatigue and malaise 2/2 influenza B. Cardiology is consulted for elevated troponin.   On arrival to ED on 1/28, Marcus [name] is afebrile, hemodynamically stable, HR 60-70s in normal sinus rhythm. Initial evaluation in ED is notable for the following: - Respiratory viral panel positive for influenza B.  - hsTnT 105-107 ng/L - EKG with NSR, RBBB, LAFB, first degree AVB. No acute ischemic changes.  - Chem panel notable for hypokalemia (2.8), Cr 2.4 (stable/downtrending), albumin 2.8 - CBC with leukocytosis to 13.7, Hgb 12.1 (stable)  On my evaluation in ED, Marcus [name] adamantly denies experiencing any chest discomfort, chest pressure, diaphoresis, or neck/arm discomfort. He states that he came in for worsening flu-like symptoms. Of note, he did experience chest pain in the past prior to undergoing coronary angiography and PCI. He is clear in stating that he has experienced nothing similar with his current presentation. He reports only flu-like symptoms and denies any cardiovascular symptomatology.    Past Medical History:  Diagnosis Date    Acute on chronic combined systolic and diastolic CHF (congestive heart failure) ([place]) 08/22/2018   Acute on chronic respiratory failure ([place]) 04/15/2015   Acute renal failure ([place]) 04/15/2015   Anemia    Atrial flutter ([facility])    Bradycardia    CAD in native artery 08/22/2018   CAP (community acquired pneumonia) 04/13/2015   See cxr 04/13/2015 > admit [facility]     Chest pain 08/20/2018   H/O hiatal hernia    Hyperlipidemia LDL goal <70 08/22/2018   Hypertension 08/22/2018   Hypothyroidism 04/13/2015   Myasthenia gravis ([place]) 04/13/2015   Non-ST elevation (NSTEMI) myocardial infarction ([place])    Obesity 04/13/2015   PAF (paroxysmal atrial fibrillation) ([place]) 04/16/2015   RBBB    S/P angioplasty with stent 08/21/18 DES to RCA 08/22/2018   Sepsis ([place]) 04/15/2015   Thrombocytopenia ([place])     Past Surgical History:  Procedure Laterality Date   AMPUTATION  06/11/2012   Procedure: AMPUTATION DIGIT;  Surgeon: [name], MD;  Location: [place];  Service: Orthopedics;  Laterality: Left;  revision of amputation   CARDIOVERSION N/A 03/24/2020   Procedure: CARDIOVERSION;  Surgeon: [name], [name], MD;  Location: [facility] ENDOSCOPY;  Service: Cardiovascular;  Laterality: N/A;   CARDIOVERSION N/A 02/14/2021   Procedure: CARDIOVERSION;  Surgeon: [name], MD;  Location: [place];  Service: Cardiovascular;  Laterality: N/A;   CARDIOVERSION N/A 11/03/2021   Procedure: CARDIOVERSION;  Surgeon: Buford Dresser, MD;  Location: [facility] ENDOSCOPY;  Service: Cardiovascular;  Laterality: N/A;   CARDIOVERSION N/A 08/09/2022   Procedure: CARDIOVERSION;  Surgeon: Werner Lean, MD;  Location: [place] ENDOSCOPY;  Service: Cardiovascular;  Laterality: N/A;  CARDIOVERSION N/A 10/24/2022   Procedure: CARDIOVERSION;  Surgeon: [name], MD;  Location: [facility] ENDOSCOPY;  Service: Cardiovascular;  Laterality: N/A;   CORONARY STENT INTERVENTION N/A 08/21/2018   Procedure: CORONARY STENT  INTERVENTION;  Surgeon: [name], MD;  Location: [place] CV LAB;  Service: Cardiovascular;  Laterality: N/A;   CORONARY STENT INTERVENTION N/A 10/13/2021   Procedure: CORONARY STENT INTERVENTION;  Surgeon: Burnell Blanks, MD;  Location: [place] CV LAB;  Service: Cardiovascular;  Laterality: N/A;   HERNIA REPAIR  1994   IR ANGIO INTRA EXTRACRAN SEL COM CAROTID INNOMINATE BILAT MOD SED  06/14/2022   LEFT HEART CATH AND CORONARY ANGIOGRAPHY N/A 08/21/2018   Procedure: LEFT HEART CATH AND CORONARY ANGIOGRAPHY;  Surgeon: [name], MD;  Location: [place] CV LAB;  Service: Cardiovascular;  Laterality: N/A;   LEFT HEART CATH AND CORONARY ANGIOGRAPHY N/A 10/13/2021   Procedure: LEFT HEART CATH AND CORONARY ANGIOGRAPHY;  Surgeon: Burnell Blanks, MD;  Location: [place] CV LAB;  Service: Cardiovascular;  Laterality: N/A;   ORIF SHOULDER FRACTURE  06/13/2012   Procedure: OPEN REDUCTION INTERNAL FIXATION (ORIF) SHOULDER FRACTURE;  Surgeon: [name], MD;  Location: [place];  Service: Orthopedics;  Laterality: Left;  LEFT SHOULDER OPEN GREATER TUBEROSITY ORIF   RIGHT/LEFT HEART CATH AND CORONARY ANGIOGRAPHY N/A 05/11/2022   Procedure: RIGHT/LEFT HEART CATH AND CORONARY ANGIOGRAPHY;  Surgeon: Martinique, [name], MD;  Location: [place] CV LAB;  Service: Cardiovascular;  Laterality: N/A;   SHOULDER CLOSED REDUCTION  06/11/2012   Procedure: CLOSED MANIPULATION SHOULDER;  Surgeon: [name], MD;  Location: [place];  Service: Orthopedics;  Laterality: Left;     Home Medications:  Prior to Admission medications   Medication Sig Start Date End Date Taking? Authorizing Provider  amiodarone (PACERONE) 200 MG tablet Take 1 tablet (200 mg total) TWICE daily for one month, then take 1 tablet ONCE daily 10/18/22   Camnitz, Ocie Doyne, MD  amLODipine (NORVASC) 10 MG tablet Take 1 tablet (10 mg total) by mouth daily. 05/08/22   Martinique, [name], MD  busPIRone (BUSPAR) 7.5 MG  tablet Take 7.5 mg by mouth 2 (two) times daily. 07/16/22   [provider]  Carboxymethylcellulose Sodium (THERATEARS) 0.25 % SOLN Apply 2 drops to eye in the morning, at noon, in the evening, and at bedtime.    [provider]  Cholecalciferol (VITAMIN D-3) 125 MCG (5000 UT) TABS Take 5,000 Units by mouth daily.    [provider]  docusate sodium (COLACE) 100 MG capsule Take 100 mg by mouth daily as needed for mild constipation.    [provider]  levothyroxine (SYNTHROID) 137 MCG tablet Take 137 mcg by mouth daily before breakfast.    [provider]  Magnesium 400 MG TABS Take 400 mg by mouth daily.    [provider]  nitroGLYCERIN (NITROSTAT) 0.4 MG SL tablet Place 0.4 mg under the tongue every 5 (five) hours as needed for chest pain. 06/23/22   [provider]  polyethylene glycol (MIRALAX / GLYCOLAX) 17 g packet Take 17 g by mouth daily as needed for mild constipation.    [provider]  torsemide (DEMADEX) 20 MG tablet Take 40 mg by mouth 2 (two) times daily.    [provider]  XARELTO 15 MG TABS tablet Take 1 tablet (15 mg total) by mouth daily. 10/05/22   Martinique, [name], MD    Inpatient Medications: Scheduled Meds:  Continuous  Infusions:  potassium chloride 10 mEq (10/17/2022 1837)   PRN Meds:   Allergies:    Allergies  Allergen Reactions   Ace Inhibitors Hives   Beta Adrenergic Blockers Hives   Statins Hives    Social History:   Social History   Socioeconomic History   Marital status: Divorced    Spouse name: Not on file   Number of children: 7   Years of education: Not on file   Highest education level: Not on file  Occupational History   Not on file  Tobacco Use   Smoking status: Former    Packs/day: 1.00    Years: 30.00    Total pack years: 30.00    Types: Cigarettes    Quit date: 01/10/1982    Years since quitting: 40.8   Smokeless tobacco: Never   Tobacco comments:     Former smoker 10/17/2021  Vaping Use   Vaping Use: Never used  Substance and Sexual Activity   Alcohol use: Not Currently    Alcohol/week: 2.0 standard drinks of alcohol    Types: 2 Glasses of wine per week    Comment: No alcohol since 10/14/2021   Drug use: No   Sexual activity: Never  Other Topics Concern   Not on file  Social History Narrative   Lives alone in a two story home.  Divorced.  Has 7 children.     Retired from [employer].     Education: some college.   Right Handed   Social Determinants of Health   Financial Resource Strain: Not on file  Food Insecurity: Not on file  Transportation Needs: Not on file  Physical Activity: Not on file  Stress: Not on file  Social Connections: Not on file  Intimate Partner Violence: Not on file    Family History:   Family History  Problem Relation Age of Onset   Other Mother        Deceased, 13   Heart attack Father        Deceased, 38   Healthy Sister    Healthy Daughter        x 4   Healthy Son        x 3     Physical Exam/Data:   Vitals:   10/19/2022 1517 10/20/2022 1710 10/22/2022 1745 10/25/2022 1800  BP:  (!) 142/79 135/68 134/84  Pulse:  76 76 74  Resp:  (!) 24 17 (!) 22  Temp:      SpO2:  95% 94% 95%  Weight: 73.5 kg     Height: '5\' 8"'$  (1.727 m)      No intake or output data in the 24 hours ending 10/30/2022 1937    10/25/2022    3:17 PM 10/24/2022    9:15 AM 10/18/2022    9:21 AM  Last 3 Weights  Weight (lbs) 162 lb 162 lb 14.7 oz 163 lb  Weight (kg) 73.483 kg 73.9 kg 73.936 kg     Body mass index is 24.63 kg/m.  General:  Tired appearing, NAD. HEENT: normal Lymph: no adenopathy Neck: no JVD Endocrine:  No thryomegaly Vascular: No carotid bruits; FA pulses 2+ bilaterally without bruits  Cardiac:  clear S1 and S2. RRR. II/VI crescendo-decrescendo murmur over upper sternal border. No JVD.  Lungs:  Unlabored respirations. Symmetric lung expansion. Scattered rhonchi.  Abd: soft, nontender, no  hepatomegaly  Ext: 2+ BiL lower extremity edema to upper shins bilaterally. WWP.  Musculoskeletal:  No deformities, BUE and BLE strength  normal and equal Skin: warm and dry  Neuro:  CNs 2-12 intact, no focal abnormalities noted Psych:  Normal affect   EKG:  The EKG was personally reviewed and demonstrates:  NSR, first degree AVB, RBBB, LAFB.  Telemetry:  Telemetry was personally reviewed and demonstrates:  ***  Relevant CV Studies:  07/07/22 TTE: 1. Left ventricular ejection fraction, by estimation, is 60 to 65%. The  left ventricle has normal function. The left ventricle demonstrates  regional wall motion abnormalities (see scoring diagram/findings for  description). There is mild left ventricular   hypertrophy. Left ventricular diastolic parameters are consistent with  Grade II diastolic dysfunction (pseudonormalization).   2. Right ventricular systolic function is normal. The right ventricular  size is mildly enlarged. There is moderately elevated pulmonary artery  systolic pressure. The estimated right ventricular systolic pressure is  69.4 mmHg.   3. Left atrial size was severely dilated.   4. Right atrial size was mildly dilated.   5. The mitral valve is normal in structure. No evidence of mitral valve  regurgitation. No evidence of mitral stenosis.   6. Tricuspid valve regurgitation is moderate to severe.   7. The aortic valve is calcified. There is moderate calcification of the  aortic valve. There is moderate thickening of the aortic valve. Aortic  valve regurgitation is mild. Moderate aortic valve stenosis. Aortic valve  mean gradient measures 18.0 mmHg.  Aortic valve Vmax measures 2.84 m/s.   8. The inferior vena cava is normal in size with greater than 50%  respiratory variability, suggesting right atrial pressure of 3 mmHg.   05/11/2022 RHC/[place]:   Prox LAD to Mid LAD lesion is 100% stenosed.   Ramus lesion is 85% stenosed.   Ost Cx to Mid Cx lesion is 60%  stenosed.   Previously placed Ost RCA to Prox RCA stent of unknown type is  widely patent.   LV end diastolic pressure is normal.   Single vessel occlusive CAD with CTO of the mid LAD. This has excellent left to left and right to left collaterals. Patent stents in the proximal RCA. Ramus intermediate has obstructive disease but is small in caliber Normal LV filling pressures. PCWP mean of 8 mm Hg. EDP 7 mm Hg Normal right heart pressures. Mean PAP 12 mm Hg Normal cardiac output. Index 2.67.   Laboratory Data:  High Sensitivity Troponin:   Recent Labs  Lab 10/06/2022 1537  TROPONINIHS 105*     Chemistry Recent Labs  Lab 10/24/22 0936 10/14/2022 1537  NA 141 131*  K 3.4* 2.8*  CL 100 91*  CO2  --  26  GLUCOSE 82 110*  BUN 62* 44*  CREATININE 2.50* 2.40*  CALCIUM  --  8.5*  GFRNONAA  --  25*  ANIONGAP  --  14    Recent Labs  Lab 10/18/2022 1537  PROT 6.3*  ALBUMIN 2.8*  AST 27  ALT 21  ALKPHOS 78  BILITOT 1.0   Hematology Recent Labs  Lab 10/24/22 0936 10/07/2022 1537  WBC  --  13.7*  RBC  --  4.07*  HGB 11.6* 12.1*  HCT 34.0* 37.1*  MCV  --  91.2  MCH  --  29.7  MCHC  --  32.6  RDW  --  15.8*  PLT  --  240   BNPNo results for input(s): "BNP", "PROBNP" in the last 168 hours.  DDimer No results for input(s): "DDIMER" in the last 168 hours.  Radiology/Studies:  DG Chest 2 View  Result  Date: 10/11/2022 CLINICAL DATA:  Cough. EXAM: CHEST - 2 VIEW COMPARISON:  July 07, 2022. FINDINGS: Stable cardiomegaly. Multiple left rib fractures are again noted. Small bilateral pleural effusions are noted with associated subsegmental atelectasis, left greater than right. IMPRESSION: Small bilateral pleural effusions are noted with associated subsegmental atelectasis, left greater than right. Multiple left rib fractures are again noted in unchanged. Electronically Signed   By: Marijo Conception M.D.   On: 10/14/2022 17:03   {  Assessment and Plan:   #Troponin  Elevation #Myocardial Injury Patient presents with flu-like symptoms, RVP positive for influenza B in ED. He adamantly denies any chest pain. hsTnT is flat (105 --> 107) and EKG shows chronic conduction disease with no acute ischemic changes. Presentation is consistent with myocardial injury secondary to other medical condition, not NSTEMI.  - No indication for ACS therapies at this time.   #Atrial Fibrillation / Atrial Flutter S/p multiple prior DCCV (most recently 10/24/22). NSR at time of presentation.  - Please replete K (K>4). Avoid IV Mg repletion in setting of known myasthenia gravis. Continue home oral Mg supplementation. - Continue home amiodarone '200mg'$  BID. - Continue home Xarelto (confirmed no missed doses) - Telemetry monitoring.  #Diastolic Heart Failure Last echo in October 2023 with LVEF 60-65%. Intravascularly euvolemic on examination (JVP 8cm H2O). Weight is stable (73.5kg). Lower extremity edema, which is not reflective of volume status, is most likely due to low oncotic pressures (albumin 2.8). Could also consider transitioning from amlodipine to alternative antihypertensive agent in future.  - Torsemide held pending repletion of K.  - Likely plan for continued '20mg'$  BID dosing at time of discharge.   For questions or updates, please contact [place] Please consult [email or web address] for contact info under    Signed, Delorse Limber, MD  10/27/2022 7:37 PM

## 2022-10-28 NOTE — ED Provider Notes (Signed)
[facility] Provider Note   CSN: [id number] Arrival date & time: 10/14/2022  1327     History  Chief Complaint  Patient presents with   Shortness of Breath    Marcus A Pohlmann Sr. is a 89 y.o. male.  The history is provided by the patient, the spouse and medical records. No language interpreter was used.  Shortness of Breath    89 year old male significant history of chronic atrial fibrillation on rate control medication and Xarelto, myasthenia gravis, CAD status post stenting, CHF, pulmonary emphysema presented to ED with complaints of shortness of breath.  History obtained through patient and through wife who is at bedside.  For the past 5 days patient has had flulike symptoms including some mild chills, decrease in appetite, generalized weakness, productive cough, and now increased shortness of breath.  States walking a short distance he feels quite weak.  He had a cardioversion procedure done 4 days ago for his A-fib.  He is on Xarelto and has been compliant.  He does not endorse any hemoptysis, no nausea vomiting or diarrhea.  He was previously diagnosed with a UTI and is currently on Cipro.  Denies worsening urinary symptoms.  Several weeks ago his diuretic was doubled and he went into renal failure.  Family would like to have his kidney level rechecked.  At this time he does not endorse any active chest pain.   Home Medications Prior to Admission medications   Medication Sig Start Date End Date Taking? Authorizing Provider  amiodarone (PACERONE) 200 MG tablet Take 1 tablet (200 mg total) TWICE daily for one month, then take 1 tablet ONCE daily 10/18/22   Camnitz, Ocie Doyne, MD  amLODipine (NORVASC) 10 MG tablet Take 1 tablet (10 mg total) by mouth daily. 05/08/22   Martinique, [name], MD  busPIRone (BUSPAR) 7.5 MG tablet Take 7.5 mg by mouth 2 (two) times daily. 07/16/22   [provider]  Carboxymethylcellulose Sodium (THERATEARS)  0.25 % SOLN Apply 2 drops to eye in the morning, at noon, in the evening, and at bedtime.    [provider]  Cholecalciferol (VITAMIN D-3) 125 MCG (5000 UT) TABS Take 5,000 Units by mouth daily.    [provider]  docusate sodium (COLACE) 100 MG capsule Take 100 mg by mouth daily as needed for mild constipation.    [provider]  levothyroxine (SYNTHROID) 137 MCG tablet Take 137 mcg by mouth daily before breakfast.    [provider]  Magnesium 400 MG TABS Take 400 mg by mouth daily.    [provider]  nitroGLYCERIN (NITROSTAT) 0.4 MG SL tablet Place 0.4 mg under the tongue every 5 (five) hours as needed for chest pain. 06/23/22   [provider]  polyethylene glycol (MIRALAX / GLYCOLAX) 17 g packet Take 17 g by mouth daily as needed for mild constipation.    [provider]  XARELTO 15 MG TABS tablet Take 1 tablet (15 mg total) by mouth daily. 10/05/22   Martinique, [name], MD      Allergies    Ace inhibitors, Beta adrenergic blockers, and Statins    Review of Systems   Review of Systems  Respiratory:  Positive for shortness of breath.   All other systems reviewed and are negative.   Physical Exam Updated Vital Signs BP 117/69 (BP Location: Left Arm)   Pulse 69   Temp 97.7 F (36.5 C)   Resp 16   Ht  $'5\' 8"'I$  (1.727 m)   Wt 73.5 kg   SpO2 96%   BMI 24.63 kg/m  Physical Exam Vitals and nursing note reviewed.  Constitutional:      General: He is not in acute distress.    Comments: Elderly male, frail in appearance, hard of hearing, in no acute respiratory distress.  HENT:     Head: Atraumatic.  Eyes:     Conjunctiva/sclera: Conjunctivae normal.  Cardiovascular:     Rate and Rhythm: Rhythm irregular.  Pulmonary:     Effort: Pulmonary effort is normal.     Breath sounds: Decreased breath sounds and rhonchi present. No rales.  Chest:     Chest wall: No tenderness.  Musculoskeletal:     Cervical back: Neck supple.      Right lower leg: Edema present.     Left lower leg: Edema present.     Comments: 1+ pitting mid to bilateral lower extremities.  Ecchymosis noted to left anterior ankle with mild tenderness to palpation.  Petechial lesions noted to toes and feet bilaterally.  Skin:    Findings: No rash.  Neurological:     Mental Status: He is alert.     ED Results / Procedures / Treatments   Labs (all labs ordered are listed, but only abnormal results are displayed) Labs Reviewed  CBC WITH DIFFERENTIAL/PLATELET - Abnormal; Notable for the following components:      Result Value   WBC 13.7 (*)    RBC 4.07 (*)    Hemoglobin 12.1 (*)    HCT 37.1 (*)    RDW 15.8 (*)    Neutro Abs 12.5 (*)    Lymphs Abs 0.4 (*)    Abs Immature Granulocytes 0.15 (*)    All other components within normal limits  RESP PANEL BY RT-PCR (RSV, FLU A&B, COVID)  [id number]  COMPREHENSIVE METABOLIC PANEL  TROPONIN I (HIGH SENSITIVITY)    EKG EKG Interpretation  Date/Time:  Sunday October 28 2022 15:07:56 EST Ventricular Rate:  71 PR Interval:  218 QRS Duration: 182 QT Interval:  522 QTC Calculation: 567 R Axis:   -78 Text Interpretation: Sinus rhythm with 1st degree A-V block Right bundle branch block Left anterior fascicular block  Bifascicular block  Minimal voltage criteria for LVH, may be normal variant ( R in aVL ) Septal infarct , age undetermined Abnormal ECG When compared with ECG of 24-Oct-2022 10:29, PREVIOUS ECG IS PRESENT Confirmed by Margaretmary Eddy [phone]) on 10/13/2022 5:17:19 PM  Radiology DG Chest 2 View  Result Date: 10/18/2022 CLINICAL DATA:  Cough. EXAM: CHEST - 2 VIEW COMPARISON:  July 07, 2022. FINDINGS: Stable cardiomegaly. Multiple left rib fractures are again noted. Small bilateral pleural effusions are noted with associated subsegmental atelectasis, left greater than right. IMPRESSION: Small bilateral pleural effusions are noted with associated subsegmental atelectasis, left greater than right.  Multiple left rib fractures are again noted in unchanged. Electronically Signed   By: Marijo Conception M.D.   On: 10/24/2022 17:03    Procedures .Critical Care  Performed by: Domenic Moras, PA-C Authorized by: Domenic Moras, PA-C   Critical care provider statement:    Critical care time (minutes):  36   Critical care was time spent personally by me on the following activities:  Development of treatment plan with patient or surrogate, discussions with consultants, evaluation of patient's response to treatment, examination of patient, ordering and review of laboratory studies, ordering and review of radiographic studies, ordering and performing treatments and interventions, pulse oximetry,  re-evaluation of patient's condition and review of old charts     Medications Ordered in ED Medications  oseltamivir (TAMIFLU) capsule 30 mg (has no administration in time range)  potassium chloride 10 mEq in 100 mL IVPB (10 mEq Intravenous New Bag/Given 10/30/2022 1740)  potassium chloride SA (KLOR-CON M) CR tablet 40 mEq (40 mEq Oral Given 10/09/2022 1738)    ED Course/ Medical Decision Making/ A&P                             Medical Decision Making Amount and/or Complexity of Data Reviewed Labs: ordered.  Risk Prescription drug management. Decision regarding hospitalization.   BP (!) 142/79   Pulse 76   Temp 97.7 F (36.5 C)   Resp (!) 24   Ht '5\' 8"'$  (1.727 m)   Wt 73.5 kg   SpO2 95%   BMI 24.63 kg/m   64:79 PM 89 year old male significant history of chronic atrial fibrillation on rate control medication, myasthenia gravis, CAD status post stenting, CHF, pulmonary emphysema presented to ED with complaints of shortness of breath.  History obtained through patient and through wife who is at bedside.  For the past 5 days patient has had flulike symptoms including some mild chills, decrease in appetite, generalized weakness, productive cough, and now increased shortness of breath.  States walking a  short distance he feels quite weak.  He had a cardioversion procedure done 4 days ago for his A-fib.  He is on Xarelto and has been compliant.  He does not endorse any hemoptysis, no nausea vomiting or diarrhea.  He was previously diagnosed with a UTI and is currently on Cipro.  Denies worsening urinary symptoms.  Several weeks ago his diuretic was doubled and he went into renal failure.  Family would like to have his kidney level rechecked.  At this time he does not endorse any active chest pain.  On exam this is a frail-appearing elderly male hard of hearing but in no acute distress.  Skin is warm to the touch.  He has irregular heart rhythm, lungs with scattered rhonchi.  Abdomen soft nontender.  He does have peripheral edema to his lower extremities with multiple petechial rash to his feet.  He is alert and oriented x 2.  Vital signs reviewed and are overall unremarkable, normal oral temperature, no hypoxia.  -Labs ordered, independently viewed and interpreted by me.  Labs remarkable for WBC 13.7 likely reflect infection.  Flu B positive, consistent with pt's presentation. K+ 2.8.  will replinish.  Cr. 2.4 mildly improve from prior.  Trop 105, elevated from prior.  No active CP, will consult card -The patient was maintained on a cardiac monitor.  I personally viewed and interpreted the cardiac monitored which showed an underlying rhythm of: sinus rhythm -Imaging independently viewed and interpreted by me and I agree with radiologist's interpretation.  Result remarkable for CXR showing small bilateral pleural effusion.  Multiple old ribs fx -This patient presents to the ED for concern of weakness, this involves an extensive number of treatment options, and is a complaint that carries with it a high risk of complications and morbidity.  The differential diagnosis includes covid, flu, RSV, acs, pna, chf, electrolytes derangement -Co morbidities that complicate the patient evaluation includes afib,  myasthenia gravis, cad, ckd, pulmonary emphysema -Treatment includes tamiflu, potassium,  -Reevaluation of the patient after these medicines showed that the patient improved -PCP office notes or outside notes reviewed -Discussion  with specialist including cardiologist who will see the pt and spoke with hospitalist Dr. Jonelle Sidle who agrees to admit pt -Escalation to admission/observation considered: patients feels comfortable being admitted   5:52 PM Patient with fatigue, cough, and cold symptoms for 5 days.  He did test positive for influenza B which is consistent with his presentation.  He had a cardioversion performed 4 days prior due to A-fib/flutter.  Today his troponin is elevated at 105, he denies any active chest pain and EKG did not shows A-fib.  He has been compliant with his Xarelto.  Will notify cardiology for recommendation.  Since patient endorse generalized weakness and shortness of breath as well as having electrolyte derangements including elevated troponin, will consult medicine for admission.  Tamiflu was ordered.  Care discussed with Dr. Philip Aspen.    6:04 PM Appreciate consultation from cardiologist [name], who will evaluate pt but does not think elevated trop is 2/2 ACS.  He was see patient and will help with plan of care.  6:31 PM Appreciate consultation from [facility] hospitalist, Dr. Jonelle Sidle who agrees to admit patient for further managements of his condition.        Final Clinical Impression(s) / ED Diagnoses Final diagnoses:  Influenza B  Hypokalemia  Elevated troponin    Rx / DC Orders ED Discharge Orders     None         Domenic Moras, PA-C 10/22/2022 1834    Fransico Meadow, MD 10/29/22 1322

## 2022-10-28 NOTE — ED Notes (Signed)
Patient resting in bed, urinal emptied.

## 2022-10-29 ENCOUNTER — Encounter (HOSPITAL_COMMUNITY): Payer: Self-pay | Admitting: Internal Medicine

## 2022-10-29 ENCOUNTER — Inpatient Hospital Stay (HOSPITAL_COMMUNITY): Payer: Medicare Other

## 2022-10-29 DIAGNOSIS — J101 Influenza due to other identified influenza virus with other respiratory manifestations: Secondary | ICD-10-CM | POA: Diagnosis not present

## 2022-10-29 DIAGNOSIS — I484 Atypical atrial flutter: Secondary | ICD-10-CM | POA: Diagnosis not present

## 2022-10-29 DIAGNOSIS — E43 Unspecified severe protein-calorie malnutrition: Secondary | ICD-10-CM | POA: Diagnosis not present

## 2022-10-29 DIAGNOSIS — E876 Hypokalemia: Secondary | ICD-10-CM | POA: Diagnosis not present

## 2022-10-29 DIAGNOSIS — Z66 Do not resuscitate: Secondary | ICD-10-CM | POA: Diagnosis not present

## 2022-10-29 DIAGNOSIS — Z515 Encounter for palliative care: Secondary | ICD-10-CM | POA: Diagnosis not present

## 2022-10-29 LAB — COMPREHENSIVE METABOLIC PANEL
ALT: 21 U/L (ref 0–44)
AST: 26 U/L (ref 15–41)
Albumin: 2.5 g/dL — ABNORMAL LOW (ref 3.5–5.0)
Alkaline Phosphatase: 67 U/L (ref 38–126)
Anion gap: 13 (ref 5–15)
BUN: 40 mg/dL — ABNORMAL HIGH (ref 8–23)
CO2: 25 mmol/L (ref 22–32)
Calcium: 8.1 mg/dL — ABNORMAL LOW (ref 8.9–10.3)
Chloride: 95 mmol/L — ABNORMAL LOW (ref 98–111)
Creatinine, Ser: 2.31 mg/dL — ABNORMAL HIGH (ref 0.61–1.24)
GFR, Estimated: 27 mL/min — ABNORMAL LOW (ref >60)
Glucose, Bld: 108 mg/dL — ABNORMAL HIGH (ref 70–99)
Potassium: 3.3 mmol/L — ABNORMAL LOW (ref 3.5–5.1)
Sodium: 133 mmol/L — ABNORMAL LOW (ref 135–145)
Total Bilirubin: 0.9 mg/dL (ref 0.3–1.2)
Total Protein: 5.9 g/dL — ABNORMAL LOW (ref 6.5–8.1)

## 2022-10-29 LAB — BASIC METABOLIC PANEL
Anion gap: 12 (ref 5–15)
BUN: 38 mg/dL — ABNORMAL HIGH (ref 8–23)
CO2: 25 mmol/L (ref 22–32)
Calcium: 8.2 mg/dL — ABNORMAL LOW (ref 8.9–10.3)
Chloride: 99 mmol/L (ref 98–111)
Creatinine, Ser: 2.05 mg/dL — ABNORMAL HIGH (ref 0.61–1.24)
GFR, Estimated: 31 mL/min — ABNORMAL LOW (ref >60)
Glucose, Bld: 127 mg/dL — ABNORMAL HIGH (ref 70–99)
Potassium: 3.9 mmol/L (ref 3.5–5.1)
Sodium: 136 mmol/L (ref 135–145)

## 2022-10-29 LAB — CBC
HCT: 35.5 % — ABNORMAL LOW (ref 39.0–52.0)
Hemoglobin: 11.6 g/dL — ABNORMAL LOW (ref 13.0–17.0)
MCH: 29.4 pg (ref 26.0–34.0)
MCHC: 32.7 g/dL (ref 30.0–36.0)
MCV: 89.9 fL (ref 80.0–100.0)
Platelets: 208 10*3/uL (ref 150–400)
RBC: 3.95 MIL/uL — ABNORMAL LOW (ref 4.22–5.81)
RDW: 15.7 % — ABNORMAL HIGH (ref 11.5–15.5)
WBC: 12.1 10*3/uL — ABNORMAL HIGH (ref 4.0–10.5)
nRBC: 0 % (ref 0.0–0.2)

## 2022-10-29 LAB — URINALYSIS, ROUTINE W REFLEX MICROSCOPIC
Bilirubin Urine: NEGATIVE
Glucose, UA: NEGATIVE mg/dL
Ketones, ur: NEGATIVE mg/dL
Leukocytes,Ua: NEGATIVE
Nitrite: NEGATIVE
Protein, ur: 30 mg/dL — AB
RBC / HPF: >50 RBC/hpf (ref 0–5)
Specific Gravity, Urine: 1.009 (ref 1.005–1.030)
pH: 5 (ref 5.0–8.0)

## 2022-10-29 LAB — PROTEIN / CREATININE RATIO, URINE
Creatinine, Urine: 38 mg/dL
Protein Creatinine Ratio: 1.68 mg/mg{Cre} — ABNORMAL HIGH (ref 0.00–0.15)
Total Protein, Urine: 64 mg/dL

## 2022-10-29 MED ORDER — BUSPIRONE HCL 5 MG PO TABS
7.5000 mg | ORAL_TABLET | Freq: Two times a day (BID) | ORAL | Status: DC
  Administered 2022-10-29 – 2022-10-30 (×3): 7.5 mg via ORAL
  Filled 2022-10-29 (×3): qty 2

## 2022-10-29 MED ORDER — MAGNESIUM SULFATE 2 GM/50ML IV SOLN
2.0000 g | Freq: Once | INTRAVENOUS | Status: AC
  Administered 2022-10-29: 2 g via INTRAVENOUS
  Filled 2022-10-29: qty 50

## 2022-10-29 MED ORDER — ONDANSETRON HCL 4 MG/2ML IJ SOLN: 4.0000 mg | Freq: Four times a day (QID) | INTRAMUSCULAR | Status: DC | PRN

## 2022-10-29 MED ORDER — FUROSEMIDE 10 MG/ML IJ SOLN: 60.0000 mg | Freq: Once | INTRAMUSCULAR | Status: DC

## 2022-10-29 MED ORDER — CIPROFLOXACIN HCL 500 MG PO TABS
250.0000 mg | ORAL_TABLET | Freq: Two times a day (BID) | ORAL | Status: DC
  Administered 2022-10-29: 250 mg via ORAL
  Filled 2022-10-29: qty 1

## 2022-10-29 MED ORDER — AMIODARONE HCL 200 MG PO TABS
200.0000 mg | ORAL_TABLET | Freq: Two times a day (BID) | ORAL | Status: DC
  Administered 2022-10-29: 200 mg via ORAL
  Filled 2022-10-29: qty 1

## 2022-10-29 MED ORDER — RIVAROXABAN 15 MG PO TABS
15.0000 mg | ORAL_TABLET | Freq: Every day | ORAL | Status: DC
  Administered 2022-10-29 – 2022-10-30 (×2): 15 mg via ORAL
  Filled 2022-10-29 (×2): qty 1

## 2022-10-29 MED ORDER — POTASSIUM CHLORIDE CRYS ER 20 MEQ PO TBCR
40.0000 meq | EXTENDED_RELEASE_TABLET | Freq: Once | ORAL | Status: AC
  Administered 2022-10-29: 40 meq via ORAL
  Filled 2022-10-29: qty 2

## 2022-10-29 MED ORDER — FUROSEMIDE 10 MG/ML IJ SOLN
60.0000 mg | Freq: Once | INTRAMUSCULAR | Status: AC
  Administered 2022-10-29: 60 mg via INTRAVENOUS
  Filled 2022-10-29: qty 6

## 2022-10-29 MED ORDER — ALBUTEROL SULFATE (2.5 MG/3ML) 0.083% IN NEBU
2.5000 mg | INHALATION_SOLUTION | RESPIRATORY_TRACT | Status: DC | PRN
  Administered 2022-10-30: 2.5 mg via RESPIRATORY_TRACT
  Filled 2022-10-29: qty 3

## 2022-10-29 MED ORDER — HEPARIN SODIUM (PORCINE) 5000 UNIT/ML IJ SOLN: 5000.0000 [IU] | Freq: Three times a day (TID) | INTRAMUSCULAR | Status: DC

## 2022-10-29 MED ORDER — LEVOTHYROXINE SODIUM 25 MCG PO TABS
137.0000 ug | ORAL_TABLET | Freq: Every day | ORAL | Status: DC
  Administered 2022-10-29 – 2022-10-30 (×2): 137 ug via ORAL
  Filled 2022-10-29 (×2): qty 1

## 2022-10-29 MED ORDER — ONDANSETRON HCL 4 MG PO TABS: 4.0000 mg | ORAL_TABLET | Freq: Four times a day (QID) | ORAL | Status: DC | PRN

## 2022-10-29 MED ORDER — FUROSEMIDE 10 MG/ML IJ SOLN: 60.0000 mg | Freq: Two times a day (BID) | INTRAMUSCULAR | Status: DC

## 2022-10-29 MED ORDER — KCL IN DEXTROSE-NACL 40-5-0.9 MEQ/L-%-% IV SOLN
INTRAVENOUS | Status: DC
  Filled 2022-10-29: qty 1000

## 2022-10-29 MED ORDER — ALBUTEROL SULFATE (2.5 MG/3ML) 0.083% IN NEBU
2.5000 mg | INHALATION_SOLUTION | Freq: Four times a day (QID) | RESPIRATORY_TRACT | Status: DC
  Administered 2022-10-29 – 2022-10-30 (×4): 2.5 mg via RESPIRATORY_TRACT
  Filled 2022-10-29 (×5): qty 3

## 2022-10-29 MED ORDER — RIVAROXABAN 15 MG PO TABS: 15.0000 mg | ORAL_TABLET | Freq: Every day | ORAL | Status: DC

## 2022-10-29 MED ORDER — OSELTAMIVIR PHOSPHATE 30 MG PO CAPS
30.0000 mg | ORAL_CAPSULE | Freq: Every day | ORAL | Status: DC
  Administered 2022-10-29 – 2022-10-30 (×2): 30 mg via ORAL
  Filled 2022-10-29 (×3): qty 1

## 2022-10-29 MED ORDER — FUROSEMIDE 10 MG/ML IJ SOLN: 40.0000 mg | Freq: Once | INTRAMUSCULAR | Status: DC

## 2022-10-29 MED ORDER — CIPROFLOXACIN HCL 500 MG PO TABS: 250.0000 mg | ORAL_TABLET | Freq: Every day | ORAL | Status: DC

## 2022-10-29 NOTE — Progress Notes (Addendum)
PROGRESS NOTE    Marcus BOUDOIN Sr.  OZD:[id number] DOB: 1931-12-30 DOA: 10/13/2022 PCP: [name], MD  Chief Complaint  Patient presents with   Shortness of Breath    Brief Narrative:  Marcus Kaufmann Sr. is Marcus [name] 89 y.o. male with medical history significant of systolic dysfunction CHF, atrial flutter, coronary artery disease, paroxysmal atrial fibrillation, hyperlipidemia, hypothyroidism, myasthenia gravis, essential hypertension, previous angioplasty to his RCA with recent cardioversion for Noelie Renfrow-fib presenting with shortness of breath, cough and weakness for the last 4 to 5 days.  Patient was recently diagnosed with UTI and was treated with Cipro.  Patient was here with his wife.  He has had significant weakness with little activity.  If his short distance walking has been difficult.  In the ER patient found to have potassium of 2.7.  Patient also having significant shortness of breath.  He is on chronic anticoagulation with Xarelto.  At this point patient appears to have elevated troponin and cardiology was consulted.  No chest pain.  Acute viral screen showed that patient was influenza B positive.  He is being admitted with acute on chronic exacerbation of systolic dysfunction.   Assessment & Plan:   Principal Problem:   Hypokalemia Active Problems:   Elevated troponin   Acute on chronic diastolic (congestive) heart failure (HCC)   Atrial flutter (HCC)   Acute renal failure superimposed on stage 3a chronic kidney disease (HCC)   CAD in native artery   Hypertension   Pulmonary emphysema (HCC)   Influenza B  # Acute Hypoxic Respiratory Failure  Acute on Chronic Combined Systolic and Diastolic Heart Failure Exacerbation  Influenza B Infection CXR with bilateral effusions, atelectasis Echo 07/2022 with EF 60-65%, grade II diastolic dysfunction Influenza positive Presentation is due to combination of influenza and HF exacerbation Continue tamiflu Had worsening SOB today, improved  after lasix per discussion with RN.  Hold IVF for now.  Will reassess volume status in AM. Strict I/O, daily weights Cardiology has signed off, will follow on diuresis, may need to call back pending his course  # Bradycardia HR into 40's, pt asymptomatic, EKG pending Tele/EKG with junctional bradycardia Holding amiodarone Will reconsult cardiology Consider cardiology   #2 severe hypokalemia: replace and follow   #4 elevated troponin: appreciate cards, thought demand in setting of flu   #5 hyponatremia: related to volume overload, follow with diuresis   #6 AKI: Continue monitoring, follow with diuresis - unclear baseline, has fluctuated widely since latter part of 2023 - suspect baseline somewhere between 1.6-2.  Creatinine was 3.93 on 1/16.  Overall improved, follow with diuresis. Renal US on 1/16  Follow UA   #7 hypothyroidism: Continue levothyroxine   #8 history of Zianne Schubring flutter: Status post recent ablation.  Continue monitoring.  Amiodarone.   #10 essential hypertension: Blood pressure appears controlled.  Continue to monitor.  Currently on cipro for UTI   DVT prophylaxis: xarelto Code Status: dnr Family Communication: none Disposition:   Status is: Inpatient Remains inpatient appropriate because: continued need for diuresis   Consultants:  none  Procedures:  none  Antimicrobials:  Anti-infectives (From admission, onward)    Start     Dose/Rate Route Frequency Ordered Stop   10/29/22 1000  oseltamivir (TAMIFLU) capsule 30 mg       Note to Pharmacy: Tamiflu 30 mg daily for CrCl <  30 mL/min   30 mg Oral Daily 10/29/22 0124 11/02/22 0959   10/29/22 0945  ciprofloxacin (CIPRO) tablet 250 mg  250 mg Oral 2 times daily 10/29/22 0847 11/13/22 0759   10/21/2022 1715  oseltamivir (TAMIFLU) capsule 30 mg        30 mg Oral  Once 10/24/2022 1709 10/01/2022 1756       Subjective: C/o shortness of breath  Objective: Vitals:   10/29/22 0829 10/29/22 1154 10/29/22 1234  10/29/22 1624  BP: 126/69 101/63 134/71 (!) 141/68  Pulse: 65 71 70 66  Resp: (!) 22 (!) 25 (!) 21 (!) 22  Temp: 98 F (36.7 C)  98.8 F (37.1 C) 97.9 F (36.6 C)  TempSrc: Oral  Oral Oral  SpO2: 100% 95%  93%  Weight:      Height:        Intake/Output Summary (Last 24 hours) at 10/29/2022 1707 Last data filed at 10/29/2022 1626 Gross per 24 hour  Intake 575.15 ml  Output 1425 ml  Net -849.85 ml   Filed Weights   10/24/2022 1517 10/29/22 0423  Weight: 73.5 kg 66.2 kg    Examination:  General exam: Appears calm and comfortable  Respiratory system: scattered rhonchi, increased WOB Cardiovascular system: RRR Gastrointestinal system: Abdomen is nondistended, soft and nontender.  Central nervous system: Alert and oriented. No focal neurological deficits. Extremities: no LEE   Data Reviewed: I have personally reviewed following labs and imaging studies  CBC: Recent Labs  Lab 10/24/22 0936 10/21/2022 1537 10/29/22 0146  WBC  --  13.7* 12.1*  NEUTROABS  --  12.5*  --   HGB 11.6* 12.1* 11.6*  HCT 34.0* 37.1* 35.5*  MCV  --  91.2 89.9  PLT  --  240 725    Basic Metabolic Panel: Recent Labs  Lab 10/24/22 0936 10/29/2022 1537 10/27/2022 1720 10/29/22 0146 10/29/22 1558  NA 141 131*  --  133* 136  K 3.4* 2.8*  --  3.3* 3.9  CL 100 91*  --  95* 99  CO2  --  26  --  25 25  GLUCOSE 82 110*  --  108* 127*  BUN 62* 44*  --  40* 38*  CREATININE 2.50* 2.40*  --  2.31* 2.05*  CALCIUM  --  8.5*  --  8.1* 8.2*  MG  --   --  1.7  --   --     GFR: Estimated Creatinine Clearance: 23.3 mL/min (Marguerite Jarboe) (by C-G formula based on SCr of 2.05 mg/dL (H)).  Liver Function Tests: Recent Labs  Lab 10/26/2022 1537 10/29/22 0146  AST 27 26  ALT 21 21  ALKPHOS 78 67  BILITOT 1.0 0.9  PROT 6.3* 5.9*  ALBUMIN 2.8* 2.5*    CBG: No results for input(s): "GLUCAP" in the last 168 hours.   Recent Results (from the past 240 hour(s))  Resp panel by RT-PCR (RSV, Flu Angelyna Henderson&B, Covid) Anterior  Nasal Swab     Status: Abnormal   Collection Time: 10/01/2022  3:37 PM   Specimen: Anterior Nasal Swab  Result Value Ref Range Status   SARS Coronavirus 2 by RT PCR NEGATIVE NEGATIVE Final    Comment: (NOTE) SARS-CoV-2 target nucleic acids are NOT DETECTED.  The SARS-CoV-2 RNA is generally detectable in upper respiratory specimens during the acute phase of infection. The lowest concentration of SARS-CoV-2 viral copies this assay can detect is 138 copies/mL. Lucerito Rosinski negative result does not preclude SARS-Cov-2 infection and should not be used as the sole basis for treatment or other patient management decisions. Bensyn Bornemann negative result may occur with  improper specimen collection/handling, submission of specimen  other than nasopharyngeal swab, presence of viral mutation(s) within the areas targeted by this assay, and inadequate number of viral copies(<138 copies/mL). Ramces Shomaker negative result must be combined with clinical observations, patient history, and epidemiological information. The expected result is Negative.  Fact Sheet for Patients:  EntrepreneurPulse.com.au  Fact Sheet for Healthcare Providers:  IncredibleEmployment.be  This test is no t yet approved or cleared by the [place] FDA and  has been authorized for detection and/or diagnosis of SARS-CoV-2 by FDA under an Emergency Use Authorization (EUA). This EUA will remain  in effect (meaning this test can be used) for the duration of the COVID-19 declaration under Section 564(b)(1) of the Act, 21 U.S.C.section 360bbb-3(b)(1), unless the authorization is terminated  or revoked sooner.       Influenza Suleman Gunning by PCR NEGATIVE NEGATIVE Final   Influenza B by PCR POSITIVE (Michele Judy) NEGATIVE Final    Comment: (NOTE) The Xpert Xpress SARS-CoV-2/FLU/RSV plus assay is intended as an aid in the diagnosis of influenza from Nasopharyngeal swab specimens and should not be used as Ellery Tash sole basis for treatment. Nasal washings  and aspirates are unacceptable for Xpert Xpress SARS-CoV-2/FLU/RSV testing.  Fact Sheet for Patients: EntrepreneurPulse.com.au  Fact Sheet for Healthcare Providers: IncredibleEmployment.be  This test is not yet approved or cleared by the [place] FDA and has been authorized for detection and/or diagnosis of SARS-CoV-2 by FDA under an Emergency Use Authorization (EUA). This EUA will remain in effect (meaning this test can be used) for the duration of the COVID-19 declaration under Section 564(b)(1) of the Act, 21 U.S.C. section 360bbb-3(b)(1), unless the authorization is terminated or revoked.     Resp Syncytial Virus by PCR NEGATIVE NEGATIVE Final    Comment: (NOTE) Fact Sheet for Patients: EntrepreneurPulse.com.au  Fact Sheet for Healthcare Providers: IncredibleEmployment.be  This test is not yet approved or cleared by the [place] FDA and has been authorized for detection and/or diagnosis of SARS-CoV-2 by FDA under an Emergency Use Authorization (EUA). This EUA will remain in effect (meaning this test can be used) for the duration of the COVID-19 declaration under Section 564(b)(1) of the Act, 21 U.S.C. section 360bbb-3(b)(1), unless the authorization is terminated or revoked.  Performed at [facility], [facility] [address] [place], [place] [address]          Radiology Studies: DG CHEST PORT 1 VIEW  Result Date: 10/29/2022 CLINICAL DATA:  Shortness of breath for Waynette Towers few days EXAM: PORTABLE CHEST 1 VIEW COMPARISON:  Portable exam 2046 hours compared to 10/23/2022 FINDINGS: Enlargement of cardiac silhouette with pulmonary vascular congestion. Atherosclerotic calcification aorta. Bibasilar effusions and atelectasis. No pulmonary infiltrate or pneumothorax. Old LEFT rib fractures. IMPRESSION: Enlargement of cardiac silhouette with pulmonary vascular congestion. Bibasilar pleural effusions and  atelectasis. Aortic Atherosclerosis (ICD10-I70.0). Electronically Signed   By: Lavonia Dana M.D.   On: 10/29/2022 13:04   DG Chest 2 View  Result Date: 10/30/2022 CLINICAL DATA:  Cough. EXAM: CHEST - 2 VIEW COMPARISON:  July 07, 2022. FINDINGS: Stable cardiomegaly. Multiple left rib fractures are again noted. Small bilateral pleural effusions are noted with associated subsegmental atelectasis, left greater than right. IMPRESSION: Small bilateral pleural effusions are noted with associated subsegmental atelectasis, left greater than right. Multiple left rib fractures are again noted in unchanged. Electronically Signed   By: Marijo Conception M.D.   On: 10/15/2022 17:03        Scheduled Meds:  albuterol  2.5 mg Nebulization Q6H WA   amiodarone  200 mg Oral BID   busPIRone  7.5 mg Oral BID   ciprofloxacin  250 mg Oral BID   levothyroxine  137 mcg Oral QAC breakfast   oseltamivir  30 mg Oral Daily   rivaroxaban  15 mg Oral Q supper   Continuous Infusions:   LOS: 1 day    Time spent: over 30 min    Fayrene Helper, MD [facility]   To contact the attending provider between 7A-7P or the covering provider during after hours 7P-7A, please log into the web site [email or web address] and access using universal [place] password for that web site. If you do not have the password, please call the hospital operator.  10/29/2022, 5:07 PM

## 2022-10-29 NOTE — Progress Notes (Signed)
PT Cancellation Note  Patient Details Name: Marcus VANE Sr. MRN: [id number] DOB: [birth year]   Cancelled Treatment:    Reason Eval/Treat Not Completed: Medical issues which prohibited therapy.  Pt is too SOB per nsg for therapy today, reattempt at another time.   Ramond Dial 10/29/2022, 11:40 AM  Mee Hives, PT PhD Acute Rehab Dept. Number: [place] [facility] [place]

## 2022-10-29 NOTE — Plan of Care (Signed)
  Problem: Clinical Measurements: ?Goal: Ability to maintain clinical measurements within normal limits will improve ?Outcome: Progressing ?Goal: Will remain free from infection ?Outcome: Progressing ?Goal: Diagnostic test results will improve ?Outcome: Progressing ?Goal: Respiratory complications will improve ?Outcome: Progressing ?Goal: Cardiovascular complication will be avoided ?Outcome: Progressing ?  ?

## 2022-10-29 NOTE — Progress Notes (Addendum)
Rounding Note    Patient Name: Marcus ZINN Sr. Date of Encounter: 10/29/2022  [facility] Cardiologist: Peter Martinique, MD   Subjective   Sitting up in bed. Wife at the bedside.   Inpatient Medications    Scheduled Meds:  heparin  5,000 Units Subcutaneous Q8H   levothyroxine  137 mcg Oral QAC breakfast   oseltamivir  30 mg Oral Daily   Continuous Infusions:  dextrose 5 % and 0.9 % NaCl with KCl 40 mEq/L 125 mL/hr at 10/29/22 0156   PRN Meds: ondansetron **OR** ondansetron (ZOFRAN) IV   Vital Signs    Vitals:   10/05/2022 2030 10/19/2022 2045 10/24/2022 2309 10/29/22 0423  BP: 135/67 129/74 123/68 125/78  Pulse: 76 74 87 87  Resp: (!) 24 (!) 23 (!) 24 (!) 24  Temp:   98.3 F (36.8 C) 98.5 F (36.9 C)  TempSrc:   Oral Oral  SpO2: 94% 95% 93% 96%  Weight:    66.2 kg  Height:        Intake/Output Summary (Last 24 hours) at 10/29/2022 0743 Last data filed at 10/29/2022 0423 Gross per 24 hour  Intake 575.15 ml  Output 675 ml  Net -99.85 ml      10/29/2022    4:23 AM 10/03/2022    3:17 PM 10/24/2022    9:15 AM  Last 3 Weights  Weight (lbs) 145 lb 15.1 oz 162 lb 162 lb 14.7 oz  Weight (kg) 66.2 kg 73.483 kg 73.9 kg      Telemetry    Sinus Rhythm, PVCs - Personally Reviewed  ECG    Sinus Rhythm, 71bpm, RBBB LAFB LVH - Personally Reviewed  Physical Exam   GEN: Thin, frail older male sitting up in bed. Neck: No JVD Cardiac: RRR, no murmurs, rubs, or gallops.  Respiratory: Course rhonchi in bases GI: Soft, nontender, non-distended  MS: No edema; No deformity. Neuro:  Nonfocal  Psych: Normal affect   Labs    High Sensitivity Troponin:   Recent Labs  Lab 10/23/2022 1537 10/26/2022 1737  TROPONINIHS 105* 107*     Chemistry Recent Labs  Lab 10/24/22 0936 10/08/2022 1537 10/15/2022 1720 10/29/22 0146  NA 141 131*  --  133*  K 3.4* 2.8*  --  3.3*  CL 100 91*  --  95*  CO2  --  26  --  25  GLUCOSE 82 110*  --  108*  BUN 62* 44*  --  40*   CREATININE 2.50* 2.40*  --  2.31*  CALCIUM  --  8.5*  --  8.1*  MG  --   --  1.7  --   PROT  --  6.3*  --  5.9*  ALBUMIN  --  2.8*  --  2.5*  AST  --  27  --  26  ALT  --  21  --  21  ALKPHOS  --  78  --  67  BILITOT  --  1.0  --  0.9  GFRNONAA  --  25*  --  27*  ANIONGAP  --  14  --  13    Lipids No results for input(s): "CHOL", "TRIG", "HDL", "LABVLDL", "LDLCALC", "CHOLHDL" in the last 168 hours.  Hematology Recent Labs  Lab 10/24/22 0936 10/08/2022 1537 10/29/22 0146  WBC  --  13.7* 12.1*  RBC  --  4.07* 3.95*  HGB 11.6* 12.1* 11.6*  HCT 34.0* 37.1* 35.5*  MCV  --  91.2 89.9  MCH  --  29.7 29.4  MCHC  --  32.6 32.7  RDW  --  15.8* 15.7*  PLT  --  240 208   Thyroid  Recent Labs  Lab 10/24/22 0945  TSH 3.695    BNP Recent Labs  Lab 10/22/2022 1719  BNP 468.0*    DDimer No results for input(s): "DDIMER" in the last 168 hours.   Radiology    DG Chest 2 View  Result Date: 10/23/2022 CLINICAL DATA:  Cough. EXAM: CHEST - 2 VIEW COMPARISON:  July 07, 2022. FINDINGS: Stable cardiomegaly. Multiple left rib fractures are again noted. Small bilateral pleural effusions are noted with associated subsegmental atelectasis, left greater than right. IMPRESSION: Small bilateral pleural effusions are noted with associated subsegmental atelectasis, left greater than right. Multiple left rib fractures are again noted in unchanged. Electronically Signed   By: Marijo Conception M.D.   On: 10/20/2022 17:03    Cardiac Studies   N/a   Patient Profile     89 y.o. male with a hx of HTN, HLD, CKD3, myasthenia gravis, CAD, pAF/AFL who was seen for the evaluation of elevated troponin at the request of Emergency Department.   Assessment & Plan    Elevated Troponin -- likely demand ischemic in the setting of flu like symptoms, tested + for influenza B -- hsTn low, flat trend 105>>107, no ischemic changes on EKG. No chest pain  -- no plans for further ischemic work up at this  time  Influenza B -- + flu on respiratory panel -- on tamilfu, per primary   Atrial fibrillation/flutter -- s/p multiple cardioversions, most recent being 10/24/2022 -- remains in SR -- continue amiodarone '200mg'$  BID and Xarelto (was not ordered but wife gave home dose last evening from home med supply)  HFpEF -- echo 07/2022 with LVEF of 60-65%, does not appear volume overloaded at this time -- home torsemide held with hypokalemia on admission, resume prior to DC  Hypokalemia -- K 2.8>>3.3 this morning -- supplement   Recent UTI -- started on cipro by PCP   CKD III -- baseline Cr 1.9-2.3  For questions or updates, please contact [place] Please consult [email or web address] for contact info under        Signed, Reino Bellis, NP  10/29/2022, 7:43 AM

## 2022-10-29 NOTE — Progress Notes (Signed)
Dr. Florene Glen updated  regarding pt increasing SOB. Lung sound diminished. O2 saturation on 2 L had fallen to 87 %. Was 93-97 % on 2 L prior. Attempted to increase O2 to 3 L - 4 L. O 2 saturation had stayed at 90 - 91 %.  Dr. Florene Glen  now in the room to evaluate pt. New order received for CxR and neb treatment.

## 2022-10-29 NOTE — Progress Notes (Signed)
Pt HR had dropped in the 40's . Pt is asymptomatic. BP at 113/51; HR = 45. Saturation at 4 L = 93 %. Dr Florene Glen updated

## 2022-10-29 NOTE — Progress Notes (Signed)
Mobility Specialist - Progress Note   10/29/22 1242  Mobility  Activity Contraindicated/medical hold   RN Advised. Pt experiencing SOB and unable to participate in session. Will follow up  [place] Specialist Please contact via SecureChat or Rehab office at [phone]

## 2022-10-29 NOTE — Progress Notes (Signed)
PHARMACY NOTE:  ANTIMICROBIAL RENAL DOSAGE ADJUSTMENT  Current antimicrobial regimen includes a mismatch between antimicrobial dosage and estimated renal function.  As per policy approved by the Pharmacy & Therapeutics and Medical Executive Committees, the antimicrobial dosage will be adjusted accordingly.  Current antimicrobial dosage:  Ciprofloxacin '250mg'$  po BID  Indication: UTI  Renal Function:  Estimated Creatinine Clearance: 23.3 mL/min (A) (by C-G formula based on SCr of 2.05 mg/dL (H)).  Antimicrobial dosage has been changed to:  ciprofloxacin '250mg'$  daily  Thank you for allowing pharmacy to be a part of this patient's care.  Sherlon Handing, PharmD, BCPS Please see amion for complete clinical pharmacist phone list 10/29/2022 5:24 PM
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
## 2022-10-29 NOTE — Progress Notes (Signed)
OT Cancellation Note  Patient Details Name: LIONARDO HAZE Sr. MRN: [id number] DOB: 10-30-1931   Cancelled Treatment:     per nursing pt. Is short of breath. Nursing asked therapy to wait on treatment.   Glory Graefe 10/29/2022, 11:18 AM

## 2022-10-30 ENCOUNTER — Inpatient Hospital Stay (HOSPITAL_COMMUNITY): Payer: Medicare Other

## 2022-10-30 DIAGNOSIS — J111 Influenza due to unidentified influenza virus with other respiratory manifestations: Secondary | ICD-10-CM | POA: Diagnosis not present

## 2022-10-30 DIAGNOSIS — I480 Paroxysmal atrial fibrillation: Secondary | ICD-10-CM

## 2022-10-30 DIAGNOSIS — E876 Hypokalemia: Secondary | ICD-10-CM | POA: Diagnosis not present

## 2022-10-30 LAB — BASIC METABOLIC PANEL
Anion gap: 15 (ref 5–15)
BUN: 44 mg/dL — ABNORMAL HIGH (ref 8–23)
CO2: 26 mmol/L (ref 22–32)
Calcium: 8.4 mg/dL — ABNORMAL LOW (ref 8.9–10.3)
Chloride: 97 mmol/L — ABNORMAL LOW (ref 98–111)
Creatinine, Ser: 2.24 mg/dL — ABNORMAL HIGH (ref 0.61–1.24)
GFR, Estimated: 28 mL/min — ABNORMAL LOW (ref >60)
Glucose, Bld: 110 mg/dL — ABNORMAL HIGH (ref 70–99)
Potassium: 4 mmol/L (ref 3.5–5.1)
Sodium: 138 mmol/L (ref 135–145)

## 2022-10-30 LAB — MAGNESIUM: Magnesium: 2.1 mg/dL (ref 1.7–2.4)

## 2022-10-30 LAB — BLOOD GAS, VENOUS
Acid-Base Excess: 4.4 mmol/L — ABNORMAL HIGH (ref 0.0–2.0)
Bicarbonate: 30.8 mmol/L — ABNORMAL HIGH (ref 20.0–28.0)
O2 Saturation: UNDETERMINED %
Patient temperature: 36.7
pCO2, Ven: 51 mmHg (ref 44–60)
pH, Ven: 7.38 (ref 7.25–7.43)
pO2, Ven: 31 mmHg — CL (ref 32–45)

## 2022-10-30 LAB — CBC
HCT: 32.8 % — ABNORMAL LOW (ref 39.0–52.0)
Hemoglobin: 10.5 g/dL — ABNORMAL LOW (ref 13.0–17.0)
MCH: 29.6 pg (ref 26.0–34.0)
MCHC: 32 g/dL (ref 30.0–36.0)
MCV: 92.4 fL (ref 80.0–100.0)
Platelets: 204 10*3/uL (ref 150–400)
RBC: 3.55 MIL/uL — ABNORMAL LOW (ref 4.22–5.81)
RDW: 15.8 % — ABNORMAL HIGH (ref 11.5–15.5)
WBC: 15.4 10*3/uL — ABNORMAL HIGH (ref 4.0–10.5)
nRBC: 0 % (ref 0.0–0.2)

## 2022-10-30 LAB — BLOOD GAS, ARTERIAL
Acid-base deficit: 2.1 mmol/L — ABNORMAL HIGH (ref 0.0–2.0)
Bicarbonate: 26.1 mmol/L (ref 20.0–28.0)
Drawn by: 42783
O2 Saturation: 96.5 %
Patient temperature: 37
pCO2 arterial: 61 mmHg — ABNORMAL HIGH (ref 32–48)
pH, Arterial: 7.24 — ABNORMAL LOW (ref 7.35–7.45)
pO2, Arterial: 89 mmHg (ref 83–108)

## 2022-10-30 LAB — BRAIN NATRIURETIC PEPTIDE: B Natriuretic Peptide: 1005.8 pg/mL — ABNORMAL HIGH (ref 0.0–100.0)

## 2022-10-30 LAB — MRSA NEXT GEN BY PCR, NASAL: MRSA by PCR Next Gen: NOT DETECTED

## 2022-10-30 LAB — STREP PNEUMONIAE URINARY ANTIGEN: Strep Pneumo Urinary Antigen: NEGATIVE

## 2022-10-30 MED ORDER — PREDNISONE 20 MG PO TABS: 40.0000 mg | ORAL_TABLET | Freq: Every day | ORAL | Status: DC

## 2022-10-30 MED ORDER — METHYLPREDNISOLONE SODIUM SUCC 40 MG IJ SOLR
40.0000 mg | Freq: Two times a day (BID) | INTRAMUSCULAR | Status: DC
  Administered 2022-10-30 – 2022-10-31 (×2): 40 mg via INTRAVENOUS
  Filled 2022-10-30 (×2): qty 1

## 2022-10-30 MED ORDER — DOXYCYCLINE HYCLATE 100 MG PO TABS
100.0000 mg | ORAL_TABLET | Freq: Two times a day (BID) | ORAL | Status: DC
  Administered 2022-10-30: 100 mg via ORAL
  Filled 2022-10-30: qty 1

## 2022-10-30 MED ORDER — FUROSEMIDE 10 MG/ML IJ SOLN
60.0000 mg | Freq: Once | INTRAMUSCULAR | Status: AC
  Administered 2022-10-30: 60 mg via INTRAVENOUS
  Filled 2022-10-30: qty 6

## 2022-10-30 MED ORDER — ENSURE ENLIVE PO LIQD
237.0000 mL | Freq: Three times a day (TID) | ORAL | Status: DC
  Administered 2022-10-30: 237 mL via ORAL

## 2022-10-30 MED ORDER — SODIUM CHLORIDE 0.9 % IV SOLN
2.0000 g | INTRAVENOUS | Status: DC
  Administered 2022-10-30: 2 g via INTRAVENOUS
  Filled 2022-10-30 (×2): qty 20

## 2022-10-30 MED ORDER — IPRATROPIUM-ALBUTEROL 0.5-2.5 (3) MG/3ML IN SOLN
3.0000 mL | Freq: Four times a day (QID) | RESPIRATORY_TRACT | Status: DC
  Administered 2022-10-30 – 2022-10-31 (×4): 3 mL via RESPIRATORY_TRACT
  Filled 2022-10-30 (×4): qty 3

## 2022-10-30 MED ORDER — ADULT MULTIVITAMIN W/MINERALS CH: 1.0000 | ORAL_TABLET | Freq: Every day | ORAL | Status: DC

## 2022-10-30 NOTE — Progress Notes (Signed)
Initial Nutrition Assessment  DOCUMENTATION CODES:   Severe malnutrition in context of chronic illness  INTERVENTION:  Will liberalize diet to regular to remove restrictions and encourage intake as appetite is poor. Encouraged pt to choose foods that are high in calories and protein at meals.  Provide Ensure Enlive po TID, each supplement provides 350 kcal and 20 grams of protein.  Provide multivitamin with minerals po daily.  NUTRITION DIAGNOSIS:   Severe Malnutrition related to chronic illness (CHF) as evidenced by severe fat depletion, severe muscle depletion.  GOAL:   Patient will meet greater than or equal to 90% of their needs  MONITOR:   PO intake, Supplement acceptance, Labs, Weight trends, I & O's  REASON FOR ASSESSMENT:   Consult Assessment of nutrition requirement/status  ASSESSMENT:   89 year old male with PMHx of hypothyroidism, CHF, HTN, HLD, myasthenia gravis, CAD, atrial flutter, paroxysmal A-fib, recent UTI who is admitted with acute hypoxic respiratory failure, acute on chronic combined systolic and diastolic heart failure exacerbation, influenza B infection, bradycardia, also with AKI.  Met with pt and his ex-wife at bedside. Pt is hard of hearing but able to provide history with assistance from ex-wife. Pt reports he has had a poor appetite for several days since 10/27/22. He reports he has not been as hungry and also has had a dry mouth. He denies nausea, emesis, abdominal pain. Also denies any food allergies or intolerances. Pt reports typically eating a majority plant-based diet and low-sodium diet. He typically has 3 meals per day. For breakfast he has eggs and toast or bran flakes. For lunch he has rice and beans or vegetable soup. For dinner he has rice and beans or fish or pho or occasionally chicken with vegetables. He also drinks 1-2 bottles of Boost original daily lately. He reports yesterday he was only able to eat 1/2 omelet in the morning and then  bites of rice and green beans at dinner. Discussed importance of adequate intake of calories and protein. Patient would benefit from liberalized diet in setting of recent poor appetite. He is also amenable to drink Ensure Enlive/Ensure Plus High Protein to help meet calorie/protein needs.  Pt reports his UBW was 160 lbs (72.7 kg) and that he has had unplanned weight loss over time. On 1/29 pt was 66.2 kg (145.94 lbs). Pt was 73.9 kg on 09/28/22, but question accuracy as exact same weight was entered on 1/18 and 1/24. Suspect pt has likely been losing weight over time and weights may have been stated or falsely elevated with fluid. Will continue to monitor trends.  Medications reviewed and include: levothyroxine, Tamiflu  Labs reviewed: Chloride 97, BUN 44, Creatinine 2.24  UOP: 980 mL + 1 occurrence unmeasured UOP  I/O: -1079.9 mL since admission  NUTRITION - FOCUSED PHYSICAL EXAM:  Flowsheet Row Most Recent Value  Orbital Region Severe depletion  Upper Arm Region Severe depletion  Thoracic and Lumbar Region Moderate depletion  Buccal Region Severe depletion  Temple Region Severe depletion  Clavicle Bone Region Severe depletion  Clavicle and Acromion Bone Region Severe depletion  Scapular Bone Region Severe depletion  Dorsal Hand Severe depletion  Patellar Region Severe depletion  Anterior Thigh Region Severe depletion  Posterior Calf Region Severe depletion  Edema (RD Assessment) None  Hair Reviewed  Eyes Reviewed  Mouth Reviewed  Skin Reviewed  Nails Reviewed  [s/p amputation of left thumb]       Diet Order:   Diet Order  Diet Heart Room service appropriate? Yes; Fluid consistency: Thin  Diet effective now                  EDUCATION NEEDS:   No education needs have been identified at this time  Skin:  Skin Assessment: Reviewed RN Assessment  Last BM:  10/23/2022 per chart  Height:   Ht Readings from Last 1 Encounters:  10/18/2022 '5\' 8"'$  (1.727 m)    Weight:   Wt Readings from Last 1 Encounters:  10/30/22 67.3 kg   Ideal Body Weight:  70 kg  BMI:  Body mass index is 22.56 kg/m.  Estimated Nutritional Needs:   Kcal:  1800-2000  Protein:  90-100 grams  Fluid:  1.8-2 L/day  Loanne Drilling, MS, RD, LDN, CNSC Pager number available on Amion

## 2022-10-30 NOTE — Evaluation (Signed)
Occupational Therapy Evaluation Patient Details Name: Marcus MOLNER Sr. MRN: [id number] DOB: 1932-06-23 Today's Date: 10/30/2022   History of Present Illness Pt 89 yo male presents to [facility] on 1/28 with coughing, fatigue, weakness, currently undergoing tx for UTI. + flu B. recent cardioversion 1/24.PMH includes afib, MG, CAD s/p stenting, HF, emphysema.   Clinical Impression   PTA patient reports independent with ADLs, mobility using RW as needed, and limited IADLs (has hired help 3x/week for assist with laundry and errands, he does not drive but manages his medications/orders groceries in).  Admitted for above and limited by problem list below.  Ex wife at bedside and very supportive.  Pt with mild confusion, difficulty recalling why he is admitted currently, difficulty with attention, awareness and problem solving.  He requires min assist for bed mobility, up to mod assist for transfers using RW due to posterior lean, and min guard to mod assist for ADLs.  Believe he will best benefit from continued OT services acutely and after dc at SNF level to optimize independence and safety with Adls, mobility. Will follow.      Recommendations for follow up therapy are one component of a multi-disciplinary discharge planning process, led by the attending physician.  Recommendations may be updated based on patient status, additional functional criteria and insurance authorization.   Follow Up Recommendations  Skilled nursing-short term rehab (<3 hours/day)     Assistance Recommended at Discharge Frequent or constant Supervision/Assistance  Patient can return home with the following A lot of help with walking and/or transfers;A lot of help with bathing/dressing/bathroom;Help with stairs or ramp for entrance;Assist for transportation;Direct supervision/assist for financial management;Direct supervision/assist for medications management;Assistance with cooking/housework    Functional Status Assessment   Patient has had a recent decline in their functional status and demonstrates the ability to make significant improvements in function in a reasonable and predictable amount of time.  Equipment Recommendations  Other (comment) (defer)    Recommendations for Other Services       Precautions / Restrictions Precautions Precautions: Fall Precaution Comments: + flu Restrictions Weight Bearing Restrictions: No      Mobility Bed Mobility Overal bed mobility: Needs Assistance Bed Mobility: Supine to Sit, Sit to Supine     Supine to sit: Min assist Sit to supine: Min assist   General bed mobility comments: trunk support to ascend, cueing to gain balance by scooting foward at EOB    Transfers Overall transfer level: Needs assistance Equipment used: Rolling walker (2 wheels) Transfers: Sit to/from Stand Sit to Stand: Mod assist           General transfer comment: cueing for hand placement, mod assist to fully power up and maintain upright position due to posterior lean      Balance Overall balance assessment: Needs assistance Sitting-balance support: Feet supported Sitting balance-Leahy Scale: Fair Sitting balance - Comments: min guard dyanically   Standing balance support: Bilateral upper extremity supported, During functional activity Standing balance-Leahy Scale: Poor Standing balance comment: relies on BUE and external support, posterior lean                           ADL either performed or assessed with clinical judgement   ADL Overall ADL's : Needs assistance/impaired     Grooming: Min guard;Sitting           Upper Body Dressing : Min guard;Sitting   Lower Body Dressing: Moderate assistance;Sit to/from stand Lower Body Dressing Details (  indicate cue type and reason): figure 4 for socks, but up to mod assist in standing during posterior lean and relies on BUE support Toilet Transfer: Moderate assistance;Ambulation;Rolling walker (2  wheels) Toilet Transfer Details (indicate cue type and reason): simulated in room         Functional mobility during ADLs: Moderate assistance;Minimal assistance;Rolling walker (2 wheels)       Vision   Vision Assessment?: No apparent visual deficits     Perception     Praxis      Pertinent Vitals/Pain Pain Assessment Pain Assessment: No/denies pain     Hand Dominance Right   Extremity/Trunk Assessment Upper Extremity Assessment Upper Extremity Assessment: Generalized weakness   Lower Extremity Assessment Lower Extremity Assessment: Defer to PT evaluation   Cervical / Trunk Assessment Cervical / Trunk Assessment: Kyphotic   Communication Communication Communication: HOH   Cognition Arousal/Alertness: Awake/alert Behavior During Therapy: WFL for tasks assessed/performed, Impulsive Overall Cognitive Status: Impaired/Different from baseline Area of Impairment: Attention, Memory, Following commands, Safety/judgement, Awareness, Problem solving, Orientation                 Orientation Level: Disoriented to, Situation Current Attention Level: Sustained Memory: Decreased short-term memory Following Commands: Follows one step commands consistently, Follows one step commands with increased time, Follows multi-step commands inconsistently Safety/Judgement: Decreased awareness of deficits, Decreased awareness of safety Awareness: Emergent Problem Solving: Slow processing, Requires verbal cues, Difficulty sequencing General Comments: pt with some confusion with current hospital stay, ex wife assisting with timeline and recall of +flu.  patient following simple commands but demonstrating decreased awareness of safety, deficits     General Comments  ex wife at side and very supportive; SpO2 maintained on 5L [place] but RR increased to mid 30s with activity. Cueing for PLB during session. HR 60s    Exercises     Shoulder Instructions      Home Living Family/patient  expects to be discharged to:: Private residence Living Arrangements: Alone Available Help at Discharge: Family;Available PRN/intermittently Type of Home: House Home Access: Stairs to enter Entrance Stairs-Number of Steps: 2   Home Layout: Two level;Able to live on main level with bedroom/bathroom     Bathroom Shower/Tub: Walk-in shower;Door   [employer] Toilet: Standard     Home Equipment: Conservation officer, nature (2 wheels);Wheelchair - manual   Additional Comments: was in ALF briefly but checked himself out and dc'd back home, has a Stage manager that helps with laundry and errands 3x/week      Prior Functioning/Environment Prior Level of Function : Needs assist             Mobility Comments: RW for mobility when "not in rhythm" otherwise not using ADLs Comments: indepednent ADLs, some assist for IADLs (laundry upstairs) and orders groceries. Does not drive but manages his medications.        OT Problem List: Decreased strength;Decreased activity tolerance;Impaired balance (sitting and/or standing);Decreased cognition;Decreased safety awareness;Decreased knowledge of use of DME or AE;Decreased knowledge of precautions;Cardiopulmonary status limiting activity      OT Treatment/Interventions: Self-care/ADL training;Therapeutic exercise;DME and/or AE instruction;Therapeutic activities;Balance training;Patient/family education;Cognitive remediation/compensation    OT Goals(Current goals can be found in the care plan section) Acute Rehab OT Goals Patient Stated Goal: feel better OT Goal Formulation: With patient Time For Goal Achievement: 11/13/22 Potential to Achieve Goals: Good  OT Frequency: Min 2X/week    Co-evaluation              AM-PAC OT "6 Clicks" Daily Activity  Outcome Measure Help from another person eating meals?: A Little Help from another person taking care of personal grooming?: A Little Help from another person toileting, which includes using toliet,  bedpan, or urinal?: A Lot Help from another person bathing (including washing, rinsing, drying)?: A Lot Help from another person to put on and taking off regular upper body clothing?: A Little Help from another person to put on and taking off regular lower body clothing?: A Lot 6 Click Score: 15   End of Session Equipment Utilized During Treatment: Rolling walker (2 wheels);Oxygen Nurse Communication: Mobility status;Precautions  Activity Tolerance: Patient limited by fatigue Patient left: in bed;with call bell/phone within reach;with bed alarm set;with family/visitor present  OT Visit Diagnosis: Other abnormalities of gait and mobility (R26.89);Muscle weakness (generalized) (M62.81);Other symptoms and signs involving cognitive function                Time: 1747-1595 OT Time Calculation (min): 22 min Charges:  OT General Charges $OT Visit: 1 Visit OT Evaluation $OT Eval Moderate Complexity: 1 Mod  Jolaine Artist, OT Acute Rehabilitation Services Office [phone]   Delight Stare 10/30/2022, 12:26 PM

## 2022-10-30 NOTE — Progress Notes (Signed)
TRH night cross cover note:   I was notified by RN of updated vgb results on this patient currently on BiPAP for hypercapnic respiratory failure, with this VBG showing 7.38/51.  O2 sats appear table in the mid 90s on BiPAP.   Results demonstrate no evidence of significant acid-base disturbance, no evidence of significant residual hypercapnia, along with stable O2 sats without escalating supplemental O2 demands. no changes at this time from my standpoint, which I communicated to patient's RN.    Babs Bertin, DO Hospitalist

## 2022-10-30 NOTE — Progress Notes (Signed)
Rounding Note    Patient Name: Marcus LARMON Sr. Date of Encounter: 10/30/2022  [place] Cardiologist: Destynie Toomey [place], MD   Subjective   Feels breathing is a little better. Cough is much better. Noted to have recurrent Afib last night with junctional escape.   Inpatient Medications    Scheduled Meds:  albuterol  2.5 mg Nebulization Q6H WA   busPIRone  7.5 mg Oral BID   levothyroxine  137 mcg Oral QAC breakfast   oseltamivir  30 mg Oral Daily   rivaroxaban  15 mg Oral Q supper   Continuous Infusions:   PRN Meds: albuterol, ondansetron **OR** ondansetron (ZOFRAN) IV   Vital Signs    Vitals:   10/29/22 1953 10/29/22 2333 10/30/22 0309 10/30/22 0732  BP: 119/67 123/88 (!) 119/56   Pulse: (!) 53 60 61 60  Resp: (!) 25 (!) 22 (!) 21 18  Temp: 98 F (36.7 C) 97.6 F (36.4 C) (!) 97.5 F (36.4 C)   TempSrc: Oral Oral Oral   SpO2: 91% 96% 93% 92%  Weight:   67.3 kg   Height:        Intake/Output Summary (Last 24 hours) at 10/30/2022 0901 Last data filed at 10/30/2022 0313 Gross per 24 hour  Intake --  Output 830 ml  Net -830 ml       10/30/2022    3:09 AM 10/29/2022    4:23 AM 10/20/2022    3:17 PM  Last 3 Weights  Weight (lbs) 148 lb 5.9 oz 145 lb 15.1 oz 162 lb  Weight (kg) 67.3 kg 66.2 kg 73.483 kg      Telemetry    Atrial fib. Rate 59 - Personally Reviewed  ECG    None today.  Physical Exam   GEN: Thin, frail older male sitting up in bed. Neck: No JVD Cardiac: RRR, no murmurs, rubs, or gallops.  Respiratory: Course rhonchi bilateral with some wheezes GI: Soft, nontender, non-distended  MS: No edema; No deformity. Neuro:  Nonfocal  Psych: Normal affect   Labs    High Sensitivity Troponin:   Recent Labs  Lab 10/17/2022 1537 10/30/2022 1737  TROPONINIHS 105* 107*      Chemistry Recent Labs  Lab 10/06/2022 1537 10/11/2022 1720 10/29/22 0146 10/29/22 1558 10/30/22 0310  NA 131*  --  133* 136  --   K 2.8*  --  3.3* 3.9  --    CL 91*  --  95* 99  --   CO2 26  --  25 25  --   GLUCOSE 110*  --  108* 127*  --   BUN 44*  --  40* 38*  --   CREATININE 2.40*  --  2.31* 2.05*  --   CALCIUM 8.5*  --  8.1* 8.2*  --   MG  --  1.7  --   --  2.1  PROT 6.3*  --  5.9*  --   --   ALBUMIN 2.8*  --  2.5*  --   --   AST 27  --  26  --   --   ALT 21  --  21  --   --   ALKPHOS 78  --  67  --   --   BILITOT 1.0  --  0.9  --   --   GFRNONAA 25*  --  27* 31*  --   ANIONGAP 14  --  13 12  --      Lipids  No results for input(s): "CHOL", "TRIG", "HDL", "LABVLDL", "LDLCALC", "CHOLHDL" in the last 168 hours.  Hematology Recent Labs  Lab 10/13/2022 1537 10/29/22 0146 10/30/22 0310  WBC 13.7* 12.1* 15.4*  RBC 4.07* 3.95* 3.55*  HGB 12.1* 11.6* 10.5*  HCT 37.1* 35.5* 32.8*  MCV 91.2 89.9 92.4  MCH 29.7 29.4 29.6  MCHC 32.6 32.7 32.0  RDW 15.8* 15.7* 15.8*  PLT 240 208 204    Thyroid  Recent Labs  Lab 10/24/22 0945  TSH 3.695     BNP Recent Labs  Lab 10/27/2022 1719  BNP 468.0*     DDimer No results for input(s): "DDIMER" in the last 168 hours.   Radiology    DG CHEST PORT 1 VIEW  Result Date: 10/30/2022 CLINICAL DATA:  SOB-HX CHF,A-FLUTTER,-AIRBORNE  ROVER-DEBORAH EXAM: PORTABLE CHEST - 1 VIEW COMPARISON:  the previous day's study FINDINGS: Bibasilar opacities left greater than right as before. Stable cardiomegaly.  Aortic Atherosclerosis (ICD10-170.0). Probable bilateral pleural effusions left greater than right. Visualized bones unremarkable. IMPRESSION: Persistent bibasilar opacities and effusions, left greater than right. Electronically Signed   By: Lucrezia Europe M.D.   On: 10/30/2022 08:05   DG CHEST PORT 1 VIEW  Result Date: 10/29/2022 CLINICAL DATA:  Shortness of breath for a few days EXAM: PORTABLE CHEST 1 VIEW COMPARISON:  Portable exam 2046 hours compared to 10/02/2022 FINDINGS: Enlargement of cardiac silhouette with pulmonary vascular congestion. Atherosclerotic calcification aorta. Bibasilar effusions and  atelectasis. No pulmonary infiltrate or pneumothorax. Old LEFT rib fractures. IMPRESSION: Enlargement of cardiac silhouette with pulmonary vascular congestion. Bibasilar pleural effusions and atelectasis. Aortic Atherosclerosis (ICD10-I70.0). Electronically Signed   By: Lavonia Dana M.D.   On: 10/29/2022 13:04   DG Chest 2 View  Result Date: 10/17/2022 CLINICAL DATA:  Cough. EXAM: CHEST - 2 VIEW COMPARISON:  July 07, 2022. FINDINGS: Stable cardiomegaly. Multiple left rib fractures are again noted. Small bilateral pleural effusions are noted with associated subsegmental atelectasis, left greater than right. IMPRESSION: Small bilateral pleural effusions are noted with associated subsegmental atelectasis, left greater than right. Multiple left rib fractures are again noted in unchanged. Electronically Signed   By: Marijo Conception M.D.   On: 10/30/2022 17:03    Cardiac Studies   N/a   Patient Profile     89 y.o. male with a hx of HTN, HLD, CKD3, myasthenia gravis, CAD, pAF/AFL who was seen for the evaluation of elevated troponin at the request of Emergency Department.   Assessment & Plan    Elevated Troponin -- likely demand ischemic in the setting of flu like symptoms, tested + for influenza B -- hsTn low, flat trend 105>>107, no ischemic changes on EKG. No chest pain  -- no plans for further ischemic work up at this time  Influenza B -- + flu on respiratory panel -- on tamilfu, per primary   Atrial fibrillation/flutter -- s/p multiple cardioversions, most recent being 10/24/2022 -- now back in Afib- rate controlled. Some junctional rhythm last night -- will hold amiodarone for now. He has failed amiodarone. Will focus on maintaining good rate control. I don't know if he is a candidate for ablation given age and multiple co-morbidities. Will need to recover from the flu and then follow up with Dr [name]  HFpEF -- echo 07/2022 with LVEF of 60-65%, does not appear volume overloaded at this  time -- home torsemide held with hypokalemia on admission, resume prior to DC  Hypokalemia -- K 2.8>>3.3>>4.0 this morning -- supplement  Recent UTI -- started on cipro by PCP   CKD III -- baseline Cr 1.9-2.3. 2.24 today  For questions or updates, please contact [place] Please consult [email or web address] for contact info under        Signed, Saverio Kader [place], MD, [facility] 10/30/2022, 9:01 AM

## 2022-10-30 NOTE — Progress Notes (Signed)
VBG with resolved hypercarbia.  Saw at bedside with ex wife, daughter.  He's awake, but agitated.  Plan to leave bipap through night if able.  Delirium precautions.  If delirium becomes issue and we think bipap is cause, could give break from bipap with RT and see how he does, but would prefer he stay on bipap through the night.

## 2022-10-30 NOTE — Progress Notes (Signed)
RT placed patient on bipap per MD order.

## 2022-10-30 NOTE — Progress Notes (Signed)
   10/30/22 1607  Assess: MEWS Score  Temp (!) 96.3 F (35.7 C)  BP 122/74  MAP (mmHg) 86  Pulse Rate 75  ECG Heart Rate 69  Resp (!) 29  Level of Consciousness Alert  SpO2 93 %  O2 Device Non-rebreather Mask  Assess: MEWS Score  MEWS Temp 1  MEWS Systolic 0  MEWS Pulse 0  MEWS RR 2  MEWS LOC 0  MEWS Score 3  MEWS Score Color Yellow  Assess: if the MEWS score is Yellow or Red  Were vital signs taken at a resting state? Yes  Focused Assessment Change from prior assessment (see assessment flowsheet)  Does the patient meet 2 or more of the SIRS criteria? Yes  Does the patient have a confirmed or suspected source of infection? Yes  Provider and Rapid Response Notified? Yes  MEWS guidelines implemented *See Row Information* Yes  Treat  MEWS Interventions Escalated (See documentation below)  Pain Scale 0-10  Pain Score 0  Take Vital Signs  Increase Vital Sign Frequency  Yellow: Q 2hr X 2 then Q 4hr X 2, if remains yellow, continue Q 4hrs  Escalate  MEWS: Escalate Yellow: discuss with charge nurse/RN and consider discussing with provider and RRT  Notify: Charge Nurse/RN  Name of Charge Nurse/RN Notified Judson Roch  Date Charge Nurse/RN Notified 10/30/22  Time Charge Nurse/RN Notified 1600  Provider Notification  Provider Name/Title Dr. Florene Glen  Date Provider Notified 10/30/22  Time Provider Notified 1605  Method of Notification Page  Notification Reason Change in status  Provider response At bedside  Date of Provider Response 10/30/22  Time of Provider Response 1608  Document  Patient Outcome Other (Comment) (Remains on floor)  Assess: SIRS CRITERIA  SIRS Temperature  1  SIRS Pulse 0  SIRS Respirations  1  SIRS WBC 0  SIRS Score Sum  2

## 2022-10-30 NOTE — Evaluation (Signed)
Physical Therapy Evaluation Patient Details Name: Marcus [name] Sr. MRN: [id number] DOB: 1931/11/16 Today's Date: 10/30/2022  History of Present Illness  Marcus A Steury Sr. is a 89 y.o. male with medical history significant of systolic dysfunction CHF, atrial flutter, coronary artery disease, paroxysmal atrial fibrillation, hyperlipidemia, hypothyroidism, myasthenia gravis, essential hypertension, previous angioplasty to his RCA with recent cardioversion for A-fib presenting with shortness of breath, cough and weakness for the last 4 to 5 days.  Patient was recently diagnosed with UTI and was treated with Cipro.  Patient here with his ex wife.  He has had significant weakness with little activity.  If his short distance walking has been difficult.  In the ER patient found to have potassium of 2.7.  Patient also having significant shortness of breath.  He is on chronic anticoagulation with Xarelto.  At this point patient appears to have elevated troponin and cardiology was consulted.  No chest pain.  Acute viral screen showed that patient was influenza B positive.  He is being admitted with acute on chronic exacerbation of systolic dysfunction.   Clinical Impression  Patient received in bed. He is agreeable to PT assessment. Ex wife at bedside and helpful. He is mod I with bed mobility. Requires min/mod A for sit to stand from bed. Patient impulsive and trying to pull up from walker unsuccessfully. Requires cues for hand placement and technique. He is then able to ambulate ~20 feet with RW and min guard. Patient is very fatigued with this. O2 sats down to 86%. Patient will continue to benefit from skilled PT to improve safety, strength and functional independence.           Recommendations for follow up therapy are one component of a multi-disciplinary discharge planning process, led by the attending physician.  Recommendations may be updated based on patient status, additional functional criteria and  insurance authorization.  Follow Up Recommendations Skilled nursing-short term rehab (<3 hours/day) Can patient physically be transported by private vehicle: Yes    Assistance Recommended at Discharge Frequent or constant Supervision/Assistance  Patient can return home with the following  A little help with walking and/or transfers;A little help with bathing/dressing/bathroom;Assist for transportation;Help with stairs or ramp for entrance    Equipment Recommendations None recommended by PT  Recommendations for Other Services       Functional Status Assessment Patient has had a recent decline in their functional status and demonstrates the ability to make significant improvements in function in a reasonable and predictable amount of time.     Precautions / Restrictions Precautions Precautions: Fall Restrictions Weight Bearing Restrictions: No      Mobility  Bed Mobility Overal bed mobility: Modified Independent             General bed mobility comments: increased time and effort needed    Transfers Overall transfer level: Needs assistance Equipment used: None Transfers: Sit to/from Stand Sit to Stand: Mod assist, From elevated surface           General transfer comment: Cues for safety with transfers, patient trying to pull up on walker unsuccessfully.    Ambulation/Gait Ambulation/Gait assistance: Min assist Gait Distance (Feet): 20 Feet Assistive device: Rolling walker (2 wheels) Gait Pattern/deviations: Step-through pattern, Decreased step length - right, Decreased step length - left Gait velocity: decr     General Gait Details: patient very fatigued with this. O2 sats down to 86% on 4-5 liters O2.  Stairs  Wheelchair Mobility    Modified Rankin (Stroke Patients Only)       Balance Overall balance assessment: Needs assistance Sitting-balance support: Feet supported Sitting balance-Leahy Scale: Fair     Standing balance  support: Bilateral upper extremity supported, During functional activity, Reliant on assistive device for balance Standing balance-Leahy Scale: Poor                               Pertinent Vitals/Pain Pain Assessment Pain Assessment: No/denies pain    Home Living Family/patient expects to be discharged to:: Private residence Living Arrangements: Alone Available Help at Discharge: Family;Available PRN/intermittently Type of Home: House Home Access: Stairs to enter   Entrance Stairs-Number of Steps: 2   Home Layout: Two level;Able to live on main level with bedroom/bathroom Home Equipment: Rolling Walker (2 wheels);Wheelchair - manual Additional Comments: went to ALF then  left and went back hom by himself per ex wife.    Prior Function Prior Level of Function : Needs assist             Mobility Comments: Rw ADLs Comments: needs assist     Hand Dominance   Dominant Hand: Right    Extremity/Trunk Assessment   Upper Extremity Assessment Upper Extremity Assessment: Generalized weakness    Lower Extremity Assessment Lower Extremity Assessment: Generalized weakness    Cervical / Trunk Assessment Cervical / Trunk Assessment: Kyphotic  Communication   Communication: HOH  Cognition Arousal/Alertness: Awake/alert Behavior During Therapy: WFL for tasks assessed/performed, Impulsive Overall Cognitive Status: Within Functional Limits for tasks assessed                                          General Comments      Exercises     Assessment/Plan    PT Assessment Patient needs continued PT services  PT Problem List Decreased strength;Cardiopulmonary status limiting activity;Decreased activity tolerance;Decreased balance;Decreased mobility;Decreased knowledge of precautions;Decreased safety awareness;Decreased knowledge of use of DME       PT Treatment Interventions DME instruction;Gait training;Functional mobility training;Therapeutic  activities;Patient/family education;Therapeutic exercise;Balance training    PT Goals (Current goals can be found in the Care Plan section)  Acute Rehab PT Goals Patient Stated Goal: improve mobility and strength PT Goal Formulation: With patient/family Time For Goal Achievement: 11/13/22 Potential to Achieve Goals: Fair    Frequency Min 2X/week     Co-evaluation               AM-PAC PT "6 Clicks" Mobility  Outcome Measure Help needed turning from your back to your side while in a flat bed without using bedrails?: None Help needed moving from lying on your back to sitting on the side of a flat bed without using bedrails?: A Little Help needed moving to and from a bed to a chair (including a wheelchair)?: A Lot Help needed standing up from a chair using your arms (e.g., wheelchair or bedside chair)?: A Lot Help needed to walk in hospital room?: A Little Help needed climbing 3-5 steps with a railing? : A Lot 6 Click Score: 16    End of Session Equipment Utilized During Treatment: Gait belt;Oxygen Activity Tolerance: Patient limited by fatigue Patient left: in bed;with call bell/phone within reach;with bed alarm set;with family/visitor present Nurse Communication: Mobility status PT Visit Diagnosis: Unsteadiness on feet (R26.81);Other abnormalities of gait and  mobility (R26.89);Muscle weakness (generalized) (M62.81);Difficulty in walking, not elsewhere classified (R26.2)    Time: 5449-2010 PT Time Calculation (min) (ACUTE ONLY): 25 min   Charges:   PT Evaluation $PT Eval Moderate Complexity: 1 Mod PT Treatments $Gait Training: 8-22 mins        Arkin Imran, PT, GCS 10/30/22,10:57 AM
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
## 2022-10-30 NOTE — Progress Notes (Addendum)
PROGRESS NOTE    Marcus RECORE Sr.  ZOX:[id number] DOB: [birth year] DOA: 10/16/2022 PCP: [name], MD  Chief Complaint  Patient presents with   Shortness of Breath    Brief Narrative:  Marcus Kaufmann Sr. is Marcus [name] 89 y.o. male with medical history significant of systolic dysfunction CHF, atrial flutter, coronary artery disease, paroxysmal atrial fibrillation, hyperlipidemia, hypothyroidism, myasthenia gravis, essential hypertension, previous angioplasty to his RCA with recent cardioversion for Kristena Wilhelmi-fib presenting with shortness of breath, cough and weakness for the last 4 to 5 days.  Patient was recently diagnosed with UTI and was treated with Cipro.  Patient was here with his wife.  He has had significant weakness with little activity.  If his short distance walking has been difficult.  In the ER patient found to have potassium of 2.7.  Patient also having significant shortness of breath.  He is on chronic anticoagulation with Xarelto.  At this point patient appears to have elevated troponin and cardiology was consulted.  No chest pain.  Acute viral screen showed that patient was influenza B positive.    He's been admitted with influenza infection.  Hospitalization complicated by hypercarbic respiratory failure, requiring bipap 1/30 PM.    Assessment & Plan:   Principal Problem:   Hypokalemia Active Problems:   Elevated troponin   Acute on chronic diastolic (congestive) heart failure (HCC)   Atrial flutter (HCC)   Acute renal failure superimposed on stage 3a chronic kidney disease (HCC)   CAD in native artery   Hypertension   Pulmonary emphysema (HCC)   Influenza B  # Acute Hypoxic and Hypercarbic Respiratory Failure  Influenza B Infection  Concern for Bacterial Pneumonia Worsening O2 needs during the day, currently requiring NRB CXR 1/30 am with bibasilar opacities and effusions, left greater than R Repeat CXR 1/30 pending Echo 07/2022 with EF 60-65%, grade II diastolic  dysfunction Influenza positive.  Worsening leukocytosis today, will add abx to cover for possible bacterial pneumonia. MRSA PCR pending Blood cultures pending Continue tamiflu.  Ceftriaxone/doxycycline added today.  ABG (hypercarbic respiratory failure -> bipap ordered) and repeat CXR pending given worsening SOB this afternoon - wheezing, plan for steroids, nebs.  ? Aspiration (CXR pending).  SLP eval.  Not overtly overloaded, but will plan for lasix as well.  Acute on Chronic Combined Systolic and Diastolic Heart Failure Exacerbation  Per cards this morning, didn't appear volume overloaded Not overtly overloaded on my exam, but CXR with bilateral effusions.  BNP elevated. Will plan for additional dose of lasix in setting of his respiratory distress, reassess daily based on volume status Strict I/O, daily weights Appreciate continued cardiology assistance  # Acute Metabolic Encephalopathy Due to hypercarbic resp failure above  # Bradycardia HR into 40's, pt asymptomatic, EKG pending Tele/EKG with junctional bradycardia Holding amiodarone Per cardiology   #2 severe hypokalemia: replace and follow   #4 elevated troponin: appreciate cards, thought demand in setting of flu   #5 hyponatremia: related to volume overload, follow with diuresis   #6 AKI  Proteinuria: Continue monitoring, follow with diuresis - unclear baseline, has fluctuated widely since latter part of 2023 - suspect baseline somewhere between 1.6-2.  Creatinine was 3.93 on 1/16.  Overall improved, follow with diuresis. Renal US on 1/16  Follow UA + protein, >50 RBC's, follow outpatient.  UP/C 1.68, follow outpatient.  # Pyuria asymptomatic, hold further cipro for now, he's covered anyway with ceftriaxone   #7 hypothyroidism: Continue levothyroxine   #8 history of Xin Klawitter flutter: Status  post recent ablation.  Continue monitoring.  Amiodarone.   #10 essential hypertension: Blood pressure appears controlled.  Continue to  monitor.     DVT prophylaxis: xarelto Code Status: dnr - ok with intubation, mechanical ventilation, cardioversion, etc Family Communication: ex wife, noted I was hopeful for improvement with steroids/bipap, but she asked if family should come.  This would be prudent given his rapid decline.   Disposition:   Status is: Inpatient Remains inpatient appropriate because: continued hypoxia    Consultants:  none  Procedures:  none  Antimicrobials:  Anti-infectives (From admission, onward)    Start     Dose/Rate Route Frequency Ordered Stop   10/30/22 1300  cefTRIAXone (ROCEPHIN) 2 g in sodium chloride 0.9 % 100 mL IVPB        2 g 200 mL/hr over 30 Minutes Intravenous Every 24 hours 10/30/22 1145 11/04/22 1259   10/30/22 1245  doxycycline (VIBRA-TABS) tablet 100 mg        100 mg Oral Every 12 hours 10/30/22 1145     10/30/22 1000  ciprofloxacin (CIPRO) tablet 250 mg  Status:  Discontinued        250 mg Oral Daily 10/29/22 1723 10/29/22 1843   10/29/22 1000  oseltamivir (TAMIFLU) capsule 30 mg       Note to Pharmacy: Tamiflu 30 mg daily for CrCl <  30 mL/min   30 mg Oral Daily 10/29/22 0124 11/02/22 0959   10/29/22 0945  ciprofloxacin (CIPRO) tablet 250 mg  Status:  Discontinued        250 mg Oral 2 times daily 10/29/22 0847 10/29/22 1723   10/20/2022 1715  oseltamivir (TAMIFLU) capsule 30 mg        30 mg Oral  Once 10/13/2022 1709 10/10/2022 1756       Subjective: C/o continued SOB today  Objective: Vitals:   10/30/22 1417 10/30/22 1558 10/30/22 1603 10/30/22 1607  BP:  122/68  122/74  Pulse: 61   75  Resp: 20   (!) 29  Temp:    (!) 96.3 F (35.7 C)  TempSrc:    Axillary  SpO2: 95% (!) 88% 90% 93%  Weight:      Height:        Intake/Output Summary (Last 24 hours) at 10/30/2022 1622 Last data filed at 10/30/2022 1400 Gross per 24 hour  Intake --  Output 1080 ml  Net -1080 ml   Filed Weights   10/13/2022 1517 10/29/22 0423 10/30/22 0309  Weight: 73.5 kg 66.2 kg 67.3 kg     Examination:  Initial evaluation this AM  General: No acute distress. Cardiovascular: RRR Lungs: scattered faint wheezing, diffuse rhonchi Abdomen: Soft, nontender, nondistended Neurological: Alert. Moves all extremities 4 with equal strength. Cranial nerves II through XII grossly intact. Extremities: No clubbing or cyanosis. No edema  Repeat evaluation with concern for SOB in PM General exam: Appears uncomfortable, staring ahead Respiratory system: diffuse wheezing, more prominent, scattered rhonchi Cardiovascular system: RRR Gastrointestinal system: Abdomen is nondistended, soft and nontender. Central nervous system: alert, moving all extremities, appears confused. Extremities: no LEE   Data Reviewed: I have personally reviewed following labs and imaging studies  CBC: Recent Labs  Lab 10/24/22 0936 10/18/2022 1537 10/29/22 0146 10/30/22 0310  WBC  --  13.7* 12.1* 15.4*  NEUTROABS  --  12.5*  --   --   HGB 11.6* 12.1* 11.6* 10.5*  HCT 34.0* 37.1* 35.5* 32.8*  MCV  --  91.2 89.9 92.4  PLT  --  240 208 062    Basic Metabolic Panel: Recent Labs  Lab 10/24/22 0936 10/20/2022 1537 10/11/2022 1720 10/29/22 0146 10/29/22 1558 10/30/22 0310  NA 141 131*  --  133* 136 138  K 3.4* 2.8*  --  3.3* 3.9 4.0  CL 100 91*  --  95* 99 97*  CO2  --  26  --  '25 25 26  '$ GLUCOSE 82 110*  --  108* 127* 110*  BUN 62* 44*  --  40* 38* 44*  CREATININE 2.50* 2.40*  --  2.31* 2.05* 2.24*  CALCIUM  --  8.5*  --  8.1* 8.2* 8.4*  MG  --   --  1.7  --   --  2.1    GFR: Estimated Creatinine Clearance: 21.7 mL/min (Jakira Mcfadden) (by C-G formula based on SCr of 2.24 mg/dL (H)).  Liver Function Tests: Recent Labs  Lab 10/10/2022 1537 10/29/22 0146  AST 27 26  ALT 21 21  ALKPHOS 78 67  BILITOT 1.0 0.9  PROT 6.3* 5.9*  ALBUMIN 2.8* 2.5*    CBG: No results for input(s): "GLUCAP" in the last 168 hours.   Recent Results (from the past 240 hour(s))  Resp panel by RT-PCR (RSV, Flu Makenna Macaluso&B, Covid)  Anterior Nasal Swab     Status: Abnormal   Collection Time: 10/29/2022  3:37 PM   Specimen: Anterior Nasal Swab  Result Value Ref Range Status   SARS Coronavirus 2 by RT PCR NEGATIVE NEGATIVE Final    Comment: (NOTE) SARS-CoV-2 target nucleic acids are NOT DETECTED.  The SARS-CoV-2 RNA is generally detectable in upper respiratory specimens during the acute phase of infection. The lowest concentration of SARS-CoV-2 viral copies this assay can detect is 138 copies/mL. Che Rachal negative result does not preclude SARS-Cov-2 infection and should not be used as the sole basis for treatment or other patient management decisions. Kelcy Baeten negative result may occur with  improper specimen collection/handling, submission of specimen other than nasopharyngeal swab, presence of viral mutation(s) within the areas targeted by this assay, and inadequate number of viral copies(<138 copies/mL). Bernie Ransford negative result must be combined with clinical observations, patient history, and epidemiological information. The expected result is Negative.  Fact Sheet for Patients:  EntrepreneurPulse.com.au  Fact Sheet for Healthcare Providers:  IncredibleEmployment.be  This test is no t yet approved or cleared by the [place] FDA and  has been authorized for detection and/or diagnosis of SARS-CoV-2 by FDA under an Emergency Use Authorization (EUA). This EUA will remain  in effect (meaning this test can be used) for the duration of the COVID-19 declaration under Section 564(b)(1) of the Act, 21 U.S.C.section 360bbb-3(b)(1), unless the authorization is terminated  or revoked sooner.       Influenza Zalen Sequeira by PCR NEGATIVE NEGATIVE Final   Influenza B by PCR POSITIVE (Izel Eisenhardt) NEGATIVE Final    Comment: (NOTE) The Xpert Xpress SARS-CoV-2/FLU/RSV plus assay is intended as an aid in the diagnosis of influenza from Nasopharyngeal swab specimens and should not be used as Garnell Phenix sole basis for treatment. Nasal  washings and aspirates are unacceptable for Xpert Xpress SARS-CoV-2/FLU/RSV testing.  Fact Sheet for Patients: EntrepreneurPulse.com.au  Fact Sheet for Healthcare Providers: IncredibleEmployment.be  This test is not yet approved or cleared by the [place] FDA and has been authorized for detection and/or diagnosis of SARS-CoV-2 by FDA under an Emergency Use Authorization (EUA). This EUA will remain in effect (meaning this test can be used) for the duration of the COVID-19 declaration under Section 564(b)(1)  of the Act, 21 U.S.C. section 360bbb-3(b)(1), unless the authorization is terminated or revoked.     Resp Syncytial Virus by PCR NEGATIVE NEGATIVE Final    Comment: (NOTE) Fact Sheet for Patients: EntrepreneurPulse.com.au  Fact Sheet for Healthcare Providers: IncredibleEmployment.be  This test is not yet approved or cleared by the [place] FDA and has been authorized for detection and/or diagnosis of SARS-CoV-2 by FDA under an Emergency Use Authorization (EUA). This EUA will remain in effect (meaning this test can be used) for the duration of the COVID-19 declaration under Section 564(b)(1) of the Act, 21 U.S.C. section 360bbb-3(b)(1), unless the authorization is terminated or revoked.  Performed at [facility], [place] [address] [place], [place] [address]          Radiology Studies: DG CHEST PORT 1 VIEW  Result Date: 10/30/2022 CLINICAL DATA:  SOB-HX CHF,Quantavis Obryant-FLUTTER,-AIRBORNE  ROVER-DEBORAH EXAM: PORTABLE CHEST - 1 VIEW COMPARISON:  the previous day's study FINDINGS: Bibasilar opacities left greater than right as before. Stable cardiomegaly.  Aortic Atherosclerosis (ICD10-170.0). Probable bilateral pleural effusions left greater than right. Visualized bones unremarkable. IMPRESSION: Persistent bibasilar opacities and effusions, left greater than right. Electronically Signed   By: Lucrezia Europe M.D.   On: 10/30/2022 08:05   DG CHEST PORT 1 VIEW  Result Date: 10/29/2022 CLINICAL DATA:  Shortness of breath for Limmie Schoenberg few days EXAM: PORTABLE CHEST 1 VIEW COMPARISON:  Portable exam 2046 hours compared to 10/19/2022 FINDINGS: Enlargement of cardiac silhouette with pulmonary vascular congestion. Atherosclerotic calcification aorta. Bibasilar effusions and atelectasis. No pulmonary infiltrate or pneumothorax. Old LEFT rib fractures. IMPRESSION: Enlargement of cardiac silhouette with pulmonary vascular congestion. Bibasilar pleural effusions and atelectasis. Aortic Atherosclerosis (ICD10-I70.0). Electronically Signed   By: Lavonia Dana M.D.   On: 10/29/2022 13:04   DG Chest 2 View  Result Date: 10/27/2022 CLINICAL DATA:  Cough. EXAM: CHEST - 2 VIEW COMPARISON:  July 07, 2022. FINDINGS: Stable cardiomegaly. Multiple left rib fractures are again noted. Small bilateral pleural effusions are noted with associated subsegmental atelectasis, left greater than right. IMPRESSION: Small bilateral pleural effusions are noted with associated subsegmental atelectasis, left greater than right. Multiple left rib fractures are again noted in unchanged. Electronically Signed   By: Marijo Conception M.D.   On: 10/11/2022 17:03        Scheduled Meds:  busPIRone  7.5 mg Oral BID   doxycycline  100 mg Oral Q12H   feeding supplement  237 mL Oral TID BM   ipratropium-albuterol  3 mL Nebulization Q6H WA   levothyroxine  137 mcg Oral QAC breakfast   methylPREDNISolone (SOLU-MEDROL) injection  40 mg Intravenous Q12H   [START ON 10/05/2022] multivitamin with minerals  1 tablet Oral Daily   oseltamivir  30 mg Oral Daily   rivaroxaban  15 mg Oral Q supper   Continuous Infusions:  cefTRIAXone (ROCEPHIN)  IV 2 g (10/30/22 1345)     LOS: 2 days    Time spent: 40 min critical care time     Fayrene Helper, MD [facility]   To contact the attending provider between 7A-7P or the covering provider  during after hours 7P-7A, please log into the web site [email or web address] and access using universal [place] password for that web site. If you do not have the password, please call the hospital operator.  10/30/2022, 4:22 PM

## 2022-10-30 NOTE — Plan of Care (Signed)

## 2022-10-31 DIAGNOSIS — E876 Hypokalemia: Secondary | ICD-10-CM | POA: Diagnosis not present

## 2022-10-31 DIAGNOSIS — R7989 Other specified abnormal findings of blood chemistry: Secondary | ICD-10-CM

## 2022-10-31 DIAGNOSIS — Z515 Encounter for palliative care: Secondary | ICD-10-CM

## 2022-10-31 DIAGNOSIS — J101 Influenza due to other identified influenza virus with other respiratory manifestations: Secondary | ICD-10-CM | POA: Diagnosis not present

## 2022-10-31 DIAGNOSIS — Z66 Do not resuscitate: Secondary | ICD-10-CM

## 2022-10-31 DIAGNOSIS — E43 Unspecified severe protein-calorie malnutrition: Secondary | ICD-10-CM | POA: Insufficient documentation

## 2022-10-31 MED ORDER — ZIPRASIDONE MESYLATE 20 MG IM SOLR
20.0000 mg | Freq: Once | INTRAMUSCULAR | Status: AC | PRN
  Administered 2022-10-31: 20 mg via INTRAMUSCULAR
  Filled 2022-10-31: qty 20

## 2022-10-31 MED ORDER — GLYCOPYRROLATE 0.2 MG/ML IJ SOLN
0.2000 mg | INTRAMUSCULAR | Status: DC | PRN
  Administered 2022-10-31: 0.2 mg via INTRAVENOUS
  Filled 2022-10-31: qty 1

## 2022-10-31 MED ORDER — HYDROMORPHONE HCL-NACL 50-0.9 MG/50ML-% IV SOLN
0.5000 mg/h | INTRAVENOUS | Status: DC
  Administered 2022-10-31: 0.5 mg/h via INTRAVENOUS
  Filled 2022-10-31: qty 50

## 2022-10-31 MED ORDER — ACETAMINOPHEN 650 MG RE SUPP: 650.0000 mg | Freq: Four times a day (QID) | RECTAL | Status: DC | PRN

## 2022-10-31 MED ORDER — ONDANSETRON 4 MG PO TBDP: 4.0000 mg | ORAL_TABLET | Freq: Four times a day (QID) | ORAL | Status: DC | PRN

## 2022-10-31 MED ORDER — LORAZEPAM 2 MG/ML PO CONC: 1.0000 mg | ORAL | Status: DC | PRN

## 2022-10-31 MED ORDER — ONDANSETRON HCL 4 MG/2ML IJ SOLN: 4.0000 mg | Freq: Four times a day (QID) | INTRAMUSCULAR | Status: DC | PRN

## 2022-10-31 MED ORDER — HALOPERIDOL LACTATE 5 MG/ML IJ SOLN
4.0000 mg | Freq: Once | INTRAMUSCULAR | Status: AC | PRN
  Administered 2022-10-31: 4 mg via INTRAVENOUS
  Filled 2022-10-31: qty 1

## 2022-10-31 MED ORDER — POLYVINYL ALCOHOL 1.4 % OP SOLN: 1.0000 [drp] | Freq: Four times a day (QID) | OPHTHALMIC | Status: DC | PRN

## 2022-10-31 MED ORDER — LORAZEPAM 2 MG/ML IJ SOLN
2.0000 mg | INTRAMUSCULAR | Status: DC | PRN
  Administered 2022-10-31: 2 mg via INTRAVENOUS
  Filled 2022-10-31: qty 1

## 2022-10-31 MED ORDER — LORAZEPAM 0.5 MG PO TABS: 1.0000 mg | ORAL_TABLET | ORAL | Status: DC | PRN

## 2022-10-31 MED ORDER — ACETAMINOPHEN 325 MG PO TABS: 650.0000 mg | ORAL_TABLET | Freq: Four times a day (QID) | ORAL | Status: DC | PRN

## 2022-10-31 MED ORDER — GLYCOPYRROLATE 0.2 MG/ML IJ SOLN: 0.2000 mg | INTRAMUSCULAR | Status: DC | PRN

## 2022-10-31 MED ORDER — LORAZEPAM 2 MG/ML IJ SOLN
1.0000 mg | INTRAMUSCULAR | Status: DC | PRN
  Administered 2022-10-31: 1 mg via INTRAVENOUS
  Filled 2022-10-31: qty 1

## 2022-10-31 MED ORDER — BIOTENE DRY MOUTH MT LIQD: 15.0000 mL | OROMUCOSAL | Status: DC | PRN

## 2022-10-31 MED ORDER — GLYCOPYRROLATE 1 MG PO TABS: 1.0000 mg | ORAL_TABLET | ORAL | Status: DC | PRN

## 2022-10-31 MED ORDER — LORAZEPAM 2 MG/ML IJ SOLN
2.0000 mg | Freq: Once | INTRAMUSCULAR | Status: AC
  Administered 2022-10-31: 2 mg via INTRAVENOUS
  Filled 2022-10-31: qty 1

## 2022-10-31 MED ORDER — HYDROMORPHONE BOLUS VIA INFUSION
0.5000 mg | INTRAVENOUS | Status: DC | PRN
  Administered 2022-10-31 (×2): 0.5 mg via INTRAVENOUS

## 2022-10-31 MED ORDER — HYDROMORPHONE BOLUS VIA INFUSION: 0.5000 mg | INTRAVENOUS | Status: DC | PRN

## 2022-10-31 NOTE — Progress Notes (Signed)
Spoke with ex-wife and son at bedside. Patient's main fear has consistently been stated that he would not want to have aggressive interventions in a state of respiratory distress, but would want to be home. Never wanted to die in a hospital. Has been struggling with the biPAP which was needed for respiratory acidosis. Heldol then geodon improved his agitation but he would not want to be aware of what is happening if he were in this state. Decision has been made to transition to comfort measures with family to call family from out of state to arrive ASAP to the hospital as this patient would not likely survive transfer off BiPAP to home or residential hospice. Dilaudid infusion, bolus prn as well as prn ativan orders placed. Full note to follow.  Vance Gather, MD 10/22/2022 9:12 AM

## 2022-10-31 NOTE — Progress Notes (Signed)
Mobility Specialist - Progress Note   10/03/2022 0938  Mobility  Activity Contraindicated/medical hold   Pt put on comfort care. Asked to discontinue services.    Franki Monte  Mobility Specialist Please contact via Solicitor or Rehab office at [phone]

## 2022-10-31 NOTE — Progress Notes (Signed)
All family members present and have seen patient. Per notes and per family, ok to take bipap off. Bipap removed. Dilaudid bolus given. Family ok with continuous pulse ox on and monitor set to comfort care mode. Family emotional but seem pleased with care of the patient.

## 2022-10-31 NOTE — Progress Notes (Signed)
[facility] PROGRESS NOTE  Marcus A Hudler Sr. (DOB: [birth year]) DJS:[id number] PCP: [name], MD  Brief Narrative: Marcus Kaufmann Sr. is an 89 y.o. male with medical history significant of systolic dysfunction CHF, atrial flutter, coronary artery disease, paroxysmal atrial fibrillation, hyperlipidemia, hypothyroidism, myasthenia gravis, essential hypertension, previous angioplasty to his RCA with recent cardioversion for A-fib presenting with shortness of breath, cough and weakness for the last 4 to 5 days.  Patient was recently diagnosed with UTI and was treated with Cipro.  Patient was here with his wife.  He has had significant weakness with little activity.  If his short distance walking has been difficult.  In the ER patient found to have potassium of 2.7.  Patient also having significant shortness of breath.  He is on chronic anticoagulation with Xarelto.  At this point patient appears to have elevated troponin and cardiology was consulted.  No chest pain.  Acute viral screen showed that patient was influenza B positive.     He's been admitted with influenza infection.  Hospitalization complicated by hypercarbic respiratory failure, requiring bipap 1/30 PM. He tolerated this poorly though hypercarbia improved. Patient, in this setting, does not wish to continue aggressive medical measures, opts for comfort measures on 1/31.   Subjective: Worsening dyspnea overnight requiring BiPAP. Has for years said he wouldn't want interventions such as that. He's got it on this AM after haldol and geodon. Not responsive.   Objective: BP 112/65   Pulse 62   Temp 98 F (36.7 C) (Axillary)   Resp (!) 27   Ht '5\' 8"'$  (1.727 m)   Wt 66.6 kg   SpO2 99%   BMI 22.32 kg/m   Gen: Elderly male resting with BiPAP on, intermittent jerking movements.  Pulm: Coarse throughout, diminished at bases CV: RRR, soft systolic murmur at base,  GI: Soft, NT, ND, +BS   Assessment & Plan: Principal  Problem:   Hypokalemia Active Problems:   Elevated troponin   Acute on chronic diastolic (congestive) heart failure (HCC)   Atrial flutter (HCC)   Acute renal failure superimposed on stage 3a chronic kidney disease (HCC)   CAD in native artery   Hypertension   Pulmonary emphysema (HCC)   Influenza B   Protein-calorie malnutrition, severe  # Acute Hypoxic and Hypercarbic Respiratory Failure  Influenza B Infection  Concern for Bacterial Pneumonia Worsening O2 needs during the day to NRB, CXR 1/30 am with bibasilar opacities and effusions. Echo 07/2022 with EF 60-65%, grade II diastolic dysfunction Influenza positive. Added abx empirically due to worsening clinical status and leukocytosis. Again, with no appreciable improvement. He remains BiPAP dependent. He has reported many times in the past that he knows he's going to die but just doesn't want to die in a hospital. We are unable to liberate him from BiPAP to facilitate transfer at this time. Family will be called to bedside and we will initiate dilaudid gtt with prn boluses, treating as though he is in a hospice environment. Family hopes for him not to have any suffering whatsoever.    Acute on Chronic Combined Systolic and Diastolic Heart Failure: Per cards this morning, didn't appear volume overloaded Not overtly overloaded on exam, given lasix without improvement in respiratory status.    Acute Metabolic Encephalopathy: Due to hypercarbic resp failure above. Pt's family holding vigil at bedside. Desire for aggressive treatment of anxiety. Required haldol then geodon last night.  - Tx w/prn's as ordered.   Bradycardia HR into 40's, pt asymptomatic, EKG  pending Tele/EKG with junctional bradycardia - Cardiology consulted, though pt/family no longer wish to pursue monitoring or treatment. D.w Dr. [place].    Severe hypokalemia: replace and follow   Elevated troponin: appreciate cards, thought demand in setting of flu   Hyponatremia:  related to volume overload, follow with diuresis   AKI  Proteinuria: Continue monitoring, follow with diuresis - unclear baseline, has fluctuated widely since latter part of 2023 - suspect baseline somewhere between 1.6-2.  Creatinine was 3.93 on 1/16.  Overall improved, follow with diuresis. Renal US on 1/16  Follow UA + protein, >50 RBC's, follow outpatient.  UP/C 1.68, follow outpatient.   Pyuria asymptomatic, hold further cipro for now    Hypothyroidism: Continue levothyroxine   History of a flutter: Status post recent ablation.  Continue monitoring.  Amiodarone.   Essential hypertension: Blood pressure appears controlled.  Continue to monitor.  Patrecia Pour, MD [facility] [email or web address] 10/02/2022, 12:57 PM
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
## 2022-10-31 NOTE — Progress Notes (Addendum)
TR[facility] night cross cover note:   I was notified by RN that the patient is agitated, and has not slept all evening.  Patient's son, who is present at bedside is requesting medication to manage the patient's agitation and hopefully get him some sleep as well.  Given the patient's age, will refrain from antihistamines to reduce potential paradoxical agitation as a consequence of this med class.  I subsequently placed order for Haldol 4 mg IV x 1 dose as needed for agitation to further address the above.    Update: Refractory agitation noted after aforementioned dose of Haldol.  I subsequently placed order for Geodon 20 mg IM x 1 dose now.  Refraining from restraints for now due to concerns regarding potential escalation in his agitation with this measure at the present time.    Babs Bertin, DO Hospitalist

## 2022-10-31 NOTE — Progress Notes (Signed)
SLP Cancellation Note  Patient Details Name: Marcus WEINBERG Sr. MRN: [id number] DOB: 1932-01-29   Cancelled treatment:       Reason Eval/Treat Not Completed: SLP screened, no needs identified, will sign off (Per EMR, pt's family has decided on comfort care.)  Jaydi Bray I. Hardin Negus, [place], [place] Office number [phone]   Horton Marshall 10/02/2022, 9:40 AM

## 2022-11-01 DIAGNOSIS — J101 Influenza due to other identified influenza virus with other respiratory manifestations: Secondary | ICD-10-CM | POA: Diagnosis not present

## 2022-11-01 DIAGNOSIS — Z66 Do not resuscitate: Secondary | ICD-10-CM | POA: Diagnosis present

## 2022-11-01 LAB — LEGIONELLA PNEUMOPHILA SEROGP 1 UR AG: L. pneumophila Serogp 1 Ur Ag: NEGATIVE

## 2022-11-01 DEATH — deceased

## 2022-11-14 ENCOUNTER — Ambulatory Visit (HOSPITAL_COMMUNITY): Payer: Medicare Other | Admitting: Nurse Practitioner

## 2022-11-22 ENCOUNTER — Ambulatory Visit: Payer: Medicare Other

## 2022-11-22 ENCOUNTER — Ambulatory Visit: Payer: Medicare Other | Admitting: Cardiology

## 2022-11-30 NOTE — Progress Notes (Signed)
30m of IV dilaudid wasted with SChanda Busing RN and SDomenica Reamer RN. Post-mortem care complete.

## 2022-11-30 NOTE — Progress Notes (Signed)
    OVERNIGHT PROGRESS REPORT  Notified by RN that patient has expired at 2353 on 10-31-2021  Patient was comfort care  2 RN verified.  Family was available to RN.   Kristopher Oppenheim, DO [facility]

## 2022-11-30 NOTE — Death Summary Note (Signed)
DEATH SUMMARY   Patient Details  Name: Marcus CAVANAGH Sr. MRN: [id number] DOB: 06-01-1932 FWY:OVZCH, Annie Main, MD Admission/Discharge Information   Admit Date:  11-07-2022  Date of Death: Date of Death: 11/10/22  Time of Death: Time of Death: 12/22/2351  Length of Stay: 4   Principle Cause of death: Acute respiratory failure due to influenza pneumonia  Hospital Diagnoses: Principal Problem:   Influenza B Active Problems:   Acute respiratory failure with hypoxia and hypercapnia (HCC)   Elevated troponin   Acute on chronic diastolic (congestive) heart failure (HCC)   Atrial flutter (HCC)   Acute renal failure superimposed on stage 3a chronic kidney disease (HCC)   CAD in native artery   Hypertension   Pulmonary emphysema (HCC)   Myasthenia gravis (HCC)   Paroxysmal atrial fibrillation (HCC)   Bradycardia   Pulmonary hypertension, unspecified (HCC)   Hypokalemia   Protein-calorie malnutrition, severe   DNR (do not resuscitate) present on admission   Hospital Course: Marcus A Defrank Sr. is an 89 y.o. male with medical history significant of systolic dysfunction CHF, atrial flutter, coronary artery disease, paroxysmal atrial fibrillation, hyperlipidemia, hypothyroidism, myasthenia gravis, essential hypertension, previous angioplasty to his RCA with recent cardioversion for A-fib presenting with shortness of breath, cough and weakness for the last 4 to 5 days.  Patient was recently diagnosed with UTI and was treated with Cipro.  Patient was here with his wife.  He has had significant weakness with little activity.  If his short distance walking has been difficult.  In the ER patient found to have potassium of 2.7.  Patient also having significant shortness of breath.  He is on chronic anticoagulation with Xarelto.  At this point patient appears to have elevated troponin and cardiology was consulted.  No chest pain.  Acute viral screen showed that patient was influenza B positive.      He's been admitted with influenza infection.  Hospitalization complicated by hypercarbic respiratory failure, requiring bipap 1/30 PM. He tolerated this poorly though hypercarbia improved. Patient, in this setting, did not wish to continue aggressive medical measures, opted insistently for comfort measures on 11/10/2022. Once family arrived, BiPAP was discontinued and patient shortly thereafter passed in peace.  Procedures: BiPAP  Consultations: Cardiology, palliative care  The results of significant diagnostics from this hospitalization (including imaging, microbiology, ancillary and laboratory) are listed below for reference.   Significant Diagnostic Studies: DG CHEST PORT 1 VIEW  Result Date: 10/30/2022 CLINICAL DATA:  Hypoxia EXAM: PORTABLE CHEST 1 VIEW COMPARISON:  Chest x-ray dated October 03, 2022 FINDINGS: Unchanged cardiomegaly. Small bilateral pleural effusions and bibasilar atelectasis, unchanged when compared with the prior exam. No evidence of pneumothorax. IMPRESSION: Unchanged small bilateral pleural effusions and bibasilar atelectasis. Electronically Signed   By: Yetta Glassman M.D.   On: 10/30/2022 16:34   DG CHEST PORT 1 VIEW  Result Date: 10/30/2022 CLINICAL DATA:  SOB-HX CHF,A-FLUTTER,-AIRBORNE  ROVER-DEBORAH EXAM: PORTABLE CHEST - 1 VIEW COMPARISON:  the previous day's study FINDINGS: Bibasilar opacities left greater than right as before. Stable cardiomegaly.  Aortic Atherosclerosis (ICD10-170.0). Probable bilateral pleural effusions left greater than right. Visualized bones unremarkable. IMPRESSION: Persistent bibasilar opacities and effusions, left greater than right. Electronically Signed   By: Lucrezia Europe M.D.   On: 10/30/2022 08:05   DG CHEST PORT 1 VIEW  Result Date: 10/29/2022 CLINICAL DATA:  Shortness of breath for a few days EXAM: PORTABLE CHEST 1 VIEW COMPARISON:  Portable exam December 21, 2044 hours compared to 11-07-22 FINDINGS: Enlargement  of cardiac silhouette with pulmonary  vascular congestion. Atherosclerotic calcification aorta. Bibasilar effusions and atelectasis. No pulmonary infiltrate or pneumothorax. Old LEFT rib fractures. IMPRESSION: Enlargement of cardiac silhouette with pulmonary vascular congestion. Bibasilar pleural effusions and atelectasis. Aortic Atherosclerosis (ICD10-I70.0). Electronically Signed   By: Lavonia Dana M.D.   On: 10/29/2022 13:04   DG Chest 2 View  Result Date: 10/11/2022 CLINICAL DATA:  Cough. EXAM: CHEST - 2 VIEW COMPARISON:  July 07, 2022. FINDINGS: Stable cardiomegaly. Multiple left rib fractures are again noted. Small bilateral pleural effusions are noted with associated subsegmental atelectasis, left greater than right. IMPRESSION: Small bilateral pleural effusions are noted with associated subsegmental atelectasis, left greater than right. Multiple left rib fractures are again noted in unchanged. Electronically Signed   By: Marijo Conception M.D.   On: 10/18/2022 17:03   US Renal  Result Date: 10/16/2022 CLINICAL DATA:  Acute kidney injury EXAM: RENAL / URINARY TRACT ULTRASOUND COMPLETE COMPARISON:  None Available. FINDINGS: Right Kidney: Renal measurements: 9.8 x 5.6 x 5.8 cm = volume: 167 mL. Mildly increased echotexture. No mass or hydronephrosis. Left Kidney: Renal measurements: 10.7 x 5.9 x 4.9 cm = volume: 161 mL. Mildly increased echotexture. No mass or hydronephrosis. Bladder: Appears normal for degree of bladder distention. Other: None. IMPRESSION: Mildly increased echotexture suggesting chronic medical renal disease. No acute findings. Electronically Signed   By: Rolm Baptise M.D.   On: 10/16/2022 23:04   CT ABDOMEN PELVIS WO CONTRAST  Result Date: 10/16/2022 CLINICAL DATA:  Abdominal and flank pain, left-sided back pain, inability to urinate EXAM: CT ABDOMEN AND PELVIS WITHOUT CONTRAST TECHNIQUE: Multidetector CT imaging of the abdomen and pelvis was performed following the standard protocol without IV contrast. RADIATION DOSE  REDUCTION: This exam was performed according to the departmental dose-optimization program which includes automated exposure control, adjustment of the mA and/or kV according to patient size and/or use of iterative reconstruction technique. COMPARISON:  06/10/2022 FINDINGS: Lower chest: Trace bilateral pleural effusions. Dependent lower lobe atelectasis. Benign partially calcified 9 mm right middle lobe pulmonary nodule unchanged since 2014. The heart is enlarged, with trace pericardial effusion. Hepatobiliary: High attenuation material within the gallbladder consistent with gallbladder sludge. No evidence of cholecystitis. Unremarkable unenhanced appearance of the liver. Pancreas: Unremarkable unenhanced appearance. Spleen: Unremarkable unenhanced appearance. Adrenals/Urinary Tract: No urinary tract calculi or obstructive uropathy within either kidney. The adrenals are unremarkable. Stable bladder diverticula and mild bladder wall thickening consistent with chronic bladder outlet obstruction. No filling defects. Stomach/Bowel: No bowel obstruction or ileus. Normal retrocecal appendix. No bowel wall thickening or inflammatory change. Vascular/Lymphatic: 4.1 cm infrarenal abdominal aortic aneurysm again noted. Evaluation of the vascular lumen is limited without IV contrast. Stable aortic atherosclerosis. No pathologic adenopathy. Reproductive: Stable enlargement of the prostate. Other: No free intraperitoneal fluid or free gas. There is nonspecific left-sided retroperitoneal fat stranding extending along the left pelvic sidewall. Musculoskeletal: No acute or destructive bony lesions. Stable lipoma anterior to the left hip. Reconstructed images demonstrate no additional findings. IMPRESSION: 1. No urinary tract calculi or obstructive uropathy. 2. Mild left retroperitoneal fat stranding, nonspecific. No evidence of hematoma on this unenhanced exam. 3. 4.1 cm infrarenal abdominal aortic aneurysm. Recommend follow-up  every 12 months and vascular consultation. Reference: J Am Coll Radiol 9562;13:086-578. 4. Trace bilateral pleural effusions. 5. Cardiomegaly, with trace pericardial effusion. 6. Mild enlargement of the prostate, with evidence of chronic bladder outlet obstruction unchanged. 7.  Aortic Atherosclerosis (ICD10-I70.0). Electronically Signed   By: Diana Eves.D.  On: 10/16/2022 19:38    Microbiology: Recent Results (from the past 240 hour(s))  Resp panel by RT-PCR (RSV, Flu A&B, Covid) Anterior Nasal Swab     Status: Abnormal   Collection Time: 10/02/2022  3:37 PM   Specimen: Anterior Nasal Swab  Result Value Ref Range Status   SARS Coronavirus 2 by RT PCR NEGATIVE NEGATIVE Final    Comment: (NOTE) SARS-CoV-2 target nucleic acids are NOT DETECTED.  The SARS-CoV-2 RNA is generally detectable in upper respiratory specimens during the acute phase of infection. The lowest concentration of SARS-CoV-2 viral copies this assay can detect is 138 copies/mL. A negative result does not preclude SARS-Cov-2 infection and should not be used as the sole basis for treatment or other patient management decisions. A negative result may occur with  improper specimen collection/handling, submission of specimen other than nasopharyngeal swab, presence of viral mutation(s) within the areas targeted by this assay, and inadequate number of viral copies(<138 copies/mL). A negative result must be combined with clinical observations, patient history, and epidemiological information. The expected result is Negative.  Fact Sheet for Patients:  EntrepreneurPulse.com.au  Fact Sheet for Healthcare Providers:  IncredibleEmployment.be  This test is no t yet approved or cleared by the [place] FDA and  has been authorized for detection and/or diagnosis of SARS-CoV-2 by FDA under an Emergency Use Authorization (EUA). This EUA will remain  in effect (meaning this test can be used)  for the duration of the COVID-19 declaration under Section 564(b)(1) of the Act, 21 U.S.C.section 360bbb-3(b)(1), unless the authorization is terminated  or revoked sooner.       Influenza A by PCR NEGATIVE NEGATIVE Final   Influenza B by PCR POSITIVE (A) NEGATIVE Final    Comment: (NOTE) The Xpert Xpress SARS-CoV-2/FLU/RSV plus assay is intended as an aid in the diagnosis of influenza from Nasopharyngeal swab specimens and should not be used as a sole basis for treatment. Nasal washings and aspirates are unacceptable for Xpert Xpress SARS-CoV-2/FLU/RSV testing.  Fact Sheet for Patients: EntrepreneurPulse.com.au  Fact Sheet for Healthcare Providers: IncredibleEmployment.be  This test is not yet approved or cleared by the [place] FDA and has been authorized for detection and/or diagnosis of SARS-CoV-2 by FDA under an Emergency Use Authorization (EUA). This EUA will remain in effect (meaning this test can be used) for the duration of the COVID-19 declaration under Section 564(b)(1) of the Act, 21 U.S.C. section 360bbb-3(b)(1), unless the authorization is terminated or revoked.     Resp Syncytial Virus by PCR NEGATIVE NEGATIVE Final    Comment: (NOTE) Fact Sheet for Patients: EntrepreneurPulse.com.au  Fact Sheet for Healthcare Providers: IncredibleEmployment.be  This test is not yet approved or cleared by the [place] FDA and has been authorized for detection and/or diagnosis of SARS-CoV-2 by FDA under an Emergency Use Authorization (EUA). This EUA will remain in effect (meaning this test can be used) for the duration of the COVID-19 declaration under Section 564(b)(1) of the Act, 21 U.S.C. section 360bbb-3(b)(1), unless the authorization is terminated or revoked.  Performed at [facility], [place] [address] [place], [place] [address]   MRSA Next Gen by PCR, Nasal     Status: None    Collection Time: 10/30/22  2:05 PM   Specimen: Nasal Mucosa; Nasal Swab  Result Value Ref Range Status   MRSA by PCR Next Gen NOT DETECTED NOT DETECTED Final    Comment: (NOTE) The GeneXpert MRSA Assay (FDA approved for NASAL specimens only), is one component  of a comprehensive MRSA colonization surveillance program. It is not intended to diagnose MRSA infection nor to guide or monitor treatment for MRSA infections. Test performance is not FDA approved in patients less than 56 years old. Performed at [facility], [place] [address], [place], [place] [address]     Time spent: 45 minutes  Signed: Patrecia Pour, MD November 04, 2022

## 2022-11-30 DEATH — deceased

## 2023-01-25 ENCOUNTER — Ambulatory Visit: Payer: Medicare Other | Admitting: Podiatry
